# Patient Record
Sex: Female | Born: 1965 | Race: Black or African American | Hispanic: No | Marital: Married | Smoking: Never smoker
Health system: Southern US, Community
[De-identification: ages and names within clinical notes are randomized; demographics above are authoritative.]

## PROBLEM LIST (undated history)

## (undated) DIAGNOSIS — G5600 Carpal tunnel syndrome, unspecified upper limb: Secondary | ICD-10-CM

## (undated) DIAGNOSIS — I639 Cerebral infarction, unspecified: Secondary | ICD-10-CM

## (undated) DIAGNOSIS — F419 Anxiety disorder, unspecified: Secondary | ICD-10-CM

## (undated) DIAGNOSIS — E11319 Type 2 diabetes mellitus with unspecified diabetic retinopathy without macular edema: Secondary | ICD-10-CM

## (undated) DIAGNOSIS — H3320 Serous retinal detachment, unspecified eye: Secondary | ICD-10-CM

## (undated) DIAGNOSIS — Z8673 Personal history of transient ischemic attack (TIA), and cerebral infarction without residual deficits: Secondary | ICD-10-CM

## (undated) DIAGNOSIS — I10 Essential (primary) hypertension: Secondary | ICD-10-CM

## (undated) DIAGNOSIS — I679 Cerebrovascular disease, unspecified: Secondary | ICD-10-CM

## (undated) DIAGNOSIS — M6530 Trigger finger, unspecified finger: Secondary | ICD-10-CM

## (undated) DIAGNOSIS — G5620 Lesion of ulnar nerve, unspecified upper limb: Secondary | ICD-10-CM

## (undated) DIAGNOSIS — E119 Type 2 diabetes mellitus without complications: Secondary | ICD-10-CM

## (undated) DIAGNOSIS — G43909 Migraine, unspecified, not intractable, without status migrainosus: Secondary | ICD-10-CM

## (undated) DIAGNOSIS — K219 Gastro-esophageal reflux disease without esophagitis: Secondary | ICD-10-CM

## (undated) DIAGNOSIS — R269 Unspecified abnormalities of gait and mobility: Secondary | ICD-10-CM

## (undated) DIAGNOSIS — E785 Hyperlipidemia, unspecified: Secondary | ICD-10-CM

## (undated) DIAGNOSIS — D473 Essential (hemorrhagic) thrombocythemia: Secondary | ICD-10-CM

## (undated) DIAGNOSIS — D75839 Thrombocytosis, unspecified: Secondary | ICD-10-CM

## (undated) HISTORY — DX: Personal history of transient ischemic attack (TIA), and cerebral infarction without residual deficits: Z86.73

## (undated) HISTORY — DX: Type 2 diabetes mellitus without complications: E11.9

## (undated) HISTORY — DX: Anxiety disorder, unspecified: F41.9

## (undated) HISTORY — DX: Lesion of ulnar nerve, unspecified upper limb: G56.20

## (undated) HISTORY — DX: Migraine, unspecified, not intractable, without status migrainosus: G43.909

## (undated) HISTORY — DX: Cerebrovascular disease, unspecified: I67.9

## (undated) HISTORY — DX: Essential (primary) hypertension: I10

## (undated) HISTORY — DX: Essential (hemorrhagic) thrombocythemia: D47.3

## (undated) HISTORY — DX: Unspecified abnormalities of gait and mobility: R26.9

## (undated) HISTORY — DX: Serous retinal detachment, unspecified eye: H33.20

## (undated) HISTORY — DX: Carpal tunnel syndrome, unspecified upper limb: G56.00

## (undated) HISTORY — DX: Trigger finger, unspecified finger: M65.30

## (undated) HISTORY — DX: Hyperlipidemia, unspecified: E78.5

## (undated) HISTORY — DX: Thrombocytosis, unspecified: D75.839

## (undated) HISTORY — DX: Gastro-esophageal reflux disease without esophagitis: K21.9

## (undated) HISTORY — DX: Type 2 diabetes mellitus with unspecified diabetic retinopathy without macular edema: E11.319

## (undated) HISTORY — DX: Cerebral infarction, unspecified: I63.9

## (undated) NOTE — *Deleted (*Deleted)
[facility]  [address] [place],  [place]  [address] [phone]  Clinic Day:  06/24/2020  Referring physician: Crist Fat, MD   This document serves as a record of services personally performed by Gery Pray, MD. It was created on their behalf by [facility], a trained medical scribe. The creation of this record is based on the scribe's personal observations and the provider's statements to them.   CHIEF COMPLAINT:  CC: Essential thrombocytopenia  Current Treatment:  Surveillance   HISTORY OF PRESENT ILLNESS:  Allison Stevens is a 86 y.o. 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  This has been treated with hydroxyurea, but the patient has often been non-compliant.  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  She has persistent pain in the left upper quadrant, attributed to splenomegaly, as well as bone pain, for which she remains on OxyContin with oxycodone for breakthrough.  We tapered her opioids down as much as possible over time.  She remains on 40 mg every 12 hours with oxycodone 10 mg every 4 hours as needed for breakthrough pain.  She saw Dr. Adriana Simas, her gastroenterologist at [facility], and underwent an esophageal dilatation in August 2016.  She had already had a colonoscopy due to previous gastrointestinal complaints.  We have been following a lesion in the pancreas seen incidentally on CT done in the emergency room for abdominal pain.  This was felt to be a benign cyst.  Repeat MRI abdomen in November 2016, revealed a slight increase in the benign appearing cyst of the pancreas.  No splenomegaly was seen.  Repeat MRI abdomen in 1 year was recommended.  She was hospitalized in September 2017 and underwent colonoscopy with removal of a small polyp in the sigmoid colon.  Small internal hemorrhoids were also seen.  Dr. Chales Abrahams also obtained a CEA and CA 19-9, and. the CA  19-9 was elevated at 145, the CEA was normal.  Repeat MRI abdomen with contrast in September 2017 after discharge from the hospital revealed a stable 14 mm cystic lesion in the pancreas.  Again, no splenomegaly was seen.  Follow-up MRI in 1 year was recommended.  Repeat MRI abdomen was done in March 2019 and revealed a 17 mm cystic lesion in the pancreatic uncinate process, which was still felt to be stable.  No significant liver lesions were seen.  Repeat MRI abdomen in 1 year was recommended.  Repeat MRI abdomen in September 2019 revealed a stable 19 mm cystic lesion of the pancreas, which appeared to be benign and still felt to be stable.  Repeat MRI abdomen with and without contrast in 1 year was recommended.  MRI abdomen in October 2020 revealed a stable 17 mm cystic lesion of the pancreas.  Continued yearly follow up MRI abdomen was recommended.  Screening mammogram in February 2016 revealed a possible mass in the right breast.  Right diagnostic mammogram and ultrasound was done and revealed 2 well circumscribed hypoechoic lesions in the right breast at 11 o 'clock, felt to represent cysts.  Repeat mammogram and ultrasound in August revealed heterogeneous fibroglandular tissue felt to be benign, with resolution of the previously seen cysts and no masses.  Repeat diagnostic bilateral mammogram and ultrasound in 6 months was once again recommended.  Bilateral diagnostic mammogram in March 2017 did not reveal any evidence of malignancy, so 1 year follow-up screening mammogram was recommended.  Bilateral screening mammogram in March 2018  did not reveal any evidence of malignancy.  Bone density scan in June 2018 revealed osteopenia with a T-score of -1.5 in the femur.  The patient was instructed to take calcium and vitamin-D twice daily.  The patient's family history is significant in that her father had pancreatic cancer at age 41, a paternal uncle had pancreatic cancer and a paternal aunt had pancreatic cancer.   Her brother also had pancreatic cancer at age 52.  She is appropriate for genetic testing, but this has not been done yet due to insurance coverage.  Bilateral screening mammogram in December 2020 did not reveal any evidence of malignancy.  At her visit in June, Dr. Gilman Buttner tried decreasing her OxyContin 30 mg every 12 hours, but then increased back to 40 mg every 12 hours, as her pain was not controlled.  She is here for routine follow up and continues to do fairly well overall, but is not feeling well today.  She states she continues hydroxyurea 500 mg four times daily.  She reports fatigue, abdominal bloating and pain.  She denies nausea, vomiting, diarrhea or constipation.  She states she is moving her bowels regularly.  She continues to have bone pain as well.  She rates her pain 4/10 today.  She continues OxyContin 40 mg every 12 hours with oxycodone 10 mg every 4 hours as needed.  She requests a refill of her medications today.  She denies fevers or chills.  She denies sore throat, cough, dyspnea, or chest pain.  She states her appetite is good, but she is not eating any different than previous and has gained weight.   INTERVAL HISTORY:  Allison Stevens is here for follow up ***.   Her  appetite is good, and she has gained/lost _ pounds since her last visit.  She denies fever, chills or other signs of infection.  She denies nausea, vomiting, bowel issues, or abdominal pain.  She denies sore throat, cough, dyspnea, or chest pain.   REVIEW OF SYSTEMS:  Review of Systems - Oncology   VITALS:  There were no vitals taken for this visit.  Wt Readings from Last 3 Encounters:  05/28/19 131 lb 6.4 oz (59.6 kg)  12/27/16 141 lb 3.2 oz (64 kg)    There is no height or weight on file to calculate BMI.  Performance status (ECOG): {CHL ONC [id number]  PHYSICAL EXAM:  Physical Exam  LABS:  No flowsheet data found. No flowsheet data found.   No results found for: CEA1 / No results found for: CEA1  No results found for: PSA1 No results found for: ZOX096 No results found for: CAN125  No results found for: TOTALPROTELP, ALBUMINELP, A1GS, A2GS, BETS, BETA2SER, GAMS, MSPIKE, SPEI No results found for: TIBC, FERRITIN, IRONPCTSAT No results found for: LDH   STUDIES:  No results found.   Allergies:  Allergies  Allergen Reactions  . Iodinated Diagnostic Agents Other (See Comments) and Shortness Of Breath    Current Medications: Current Outpatient Medications  Medication Sig Dispense Refill  . ALPRAZolam (XANAX) 0.5 MG tablet Take 0.5 mg by mouth at bedtime as needed for anxiety.    Marland Kitchen amLODipine (NORVASC) 10 MG tablet Take 10 mg by mouth daily.    . clopidogrel (PLAVIX) 75 MG tablet Take 75 mg by mouth daily.    . enalapril (VASOTEC) 10 MG tablet Take 10 mg by mouth 2 (two) times daily.    Marland Kitchen escitalopram (LEXAPRO) 10 MG tablet Take 10 mg by mouth daily.    Marland Kitchen  hydroxyurea (HYDREA) 500 MG capsule Take 500 mg by mouth 4 (four) times daily. May take with food to minimize GI side effects.    Marland Kitchen ibuprofen (ADVIL,MOTRIN) 800 MG tablet Take 800 mg by mouth every 8 (eight) hours as needed.    . insulin aspart (NOVOLOG) 100 UNIT/ML injection Inject 34 Units into the skin 3 (three) times daily before meals.    . Insulin Detemir (LEVEMIR) 100 UNIT/ML Pen Inject into the skin.    . Insulin Glargine (TOUJEO MAX SOLOSTAR) 300 UNIT/ML SOPN Inject 72 Units into the skin daily.    . metFORMIN (GLUCOPHAGE) 1000 MG tablet Take 1,000 mg by mouth 2 (two) times daily with a meal.    . montelukast (SINGULAIR) 10 MG tablet Take 10 mg by mouth at bedtime.    . niacin 500 MG tablet Take 500 mg by mouth at bedtime.    Marland Kitchen oxycodone (OXY-IR) 5 MG capsule Take 10 mg by mouth every 4 (four) hours as needed.    Marland Kitchen oxyCODONE (OXYCONTIN) 40 mg 12 hr tablet Take 40 mg by mouth every 12 (twelve) hours.    . pantoprazole (PROTONIX) 40 MG tablet Take 1 tablet (40 mg total) by mouth daily. 30 tablet 6  . pravastatin  (PRAVACHOL) 40 MG tablet Take 40 mg by mouth daily.    . prochlorperazine (COMPAZINE) 10 MG tablet Take 10 mg by mouth every 6 (six) hours as needed for nausea or vomiting.    . TRULICITY 3 MG/0.5ML SOPN      No current facility-administered medications for this visit.     ASSESSMENT & PLAN:   Assessment:   1. Essential thrombocytosis, well controlled with hydroxyurea 500 mg 4 times daily.  She is on chronic opioids due to bone pain.    2. Prior strokes due to thrombocytosis.  3. Cystic lesion of the pancreas, for which she has been undergoing annual MRI abdomen, which is due in October.  4. Diabetes, for which she sees Dr. Leonia Reader.   5. Elevated liver transaminases of uncertain etiology, these have fluctuated up and down, but they are higher than previous.    6. Abdominal bloating and discomfort, with history of pancreatic cyst.  7. Multiple first-degree relatives with pancreatic cancer.  Genetic testing has been recommended, but not done due to lack of insurance coverage.  Plan: Due to the abdominal bloating and discomfort, as well as elevated liver transaminases, I will proceed with an MRI abdomen as soon as possible instead of waiting a full year.  I will plan to contact her with the results.  She knows to continue hydroxyurea 4 times daily.  We will continue OxyContin 40 mg every 12 hours and oxycodone 10 mg every 4 hours as needed for breakthrough pain and refilled those today.  We will see her back in 3 months with CBC and comprehensive metabolic profile for continue follow-up.  The patient understands the plans discussed today and is in agreement with them.  She knows to contact our office if she develops concerns prior to her next appointment.   I provided *** minutes (1:18 PM - 1:18 PM) of face-to-face time during this this encounter and > 50% was spent counseling as documented under my assessment and plan.    Dellia Beckwith, MD [facility] [address] [place] [address] Dept: [phone] Dept Fax: [phone]   I, Foye Deer, am acting as scribe for Dellia Beckwith, MD  I have reviewed this report as typed by the medical scribe, and it is complete and accurate.

---

## 1998-10-18 ENCOUNTER — Observation Stay (HOSPITAL_COMMUNITY): Admission: EM | Admit: 1998-10-18 | Discharge: 1998-10-19 | Payer: Self-pay | Admitting: Emergency Medicine

## 2000-08-01 DIAGNOSIS — E119 Type 2 diabetes mellitus without complications: Secondary | ICD-10-CM

## 2000-08-01 HISTORY — DX: Type 2 diabetes mellitus without complications: E11.9

## 2002-03-28 ENCOUNTER — Encounter: Payer: Self-pay | Admitting: General Surgery

## 2002-03-28 ENCOUNTER — Encounter: Admission: RE | Admit: 2002-03-28 | Discharge: 2002-03-28 | Payer: Self-pay | Admitting: General Surgery

## 2003-10-09 ENCOUNTER — Emergency Department (HOSPITAL_COMMUNITY): Admission: EM | Admit: 2003-10-09 | Discharge: 2003-10-09 | Payer: Self-pay | Admitting: Emergency Medicine

## 2004-06-04 ENCOUNTER — Ambulatory Visit: Payer: Self-pay | Admitting: Oncology

## 2004-07-23 ENCOUNTER — Ambulatory Visit: Payer: Self-pay | Admitting: Oncology

## 2004-09-13 ENCOUNTER — Ambulatory Visit: Payer: Self-pay | Admitting: Oncology

## 2004-11-10 ENCOUNTER — Ambulatory Visit: Payer: Self-pay | Admitting: Oncology

## 2005-01-05 ENCOUNTER — Ambulatory Visit: Payer: Self-pay | Admitting: Oncology

## 2005-03-02 ENCOUNTER — Ambulatory Visit: Payer: Self-pay | Admitting: Oncology

## 2005-04-21 ENCOUNTER — Ambulatory Visit: Payer: Self-pay | Admitting: Oncology

## 2005-06-09 ENCOUNTER — Ambulatory Visit: Payer: Self-pay | Admitting: Oncology

## 2005-07-28 ENCOUNTER — Ambulatory Visit: Payer: Self-pay | Admitting: Oncology

## 2005-09-19 ENCOUNTER — Ambulatory Visit: Payer: Self-pay | Admitting: Oncology

## 2005-11-07 ENCOUNTER — Ambulatory Visit (HOSPITAL_COMMUNITY): Admission: RE | Admit: 2005-11-07 | Discharge: 2005-11-07 | Payer: Self-pay | Admitting: Vascular Surgery

## 2005-11-11 ENCOUNTER — Ambulatory Visit: Payer: Self-pay | Admitting: Oncology

## 2006-01-06 ENCOUNTER — Ambulatory Visit: Payer: Self-pay | Admitting: Oncology

## 2006-03-01 ENCOUNTER — Ambulatory Visit: Payer: Self-pay | Admitting: Oncology

## 2006-05-08 ENCOUNTER — Ambulatory Visit: Payer: Self-pay | Admitting: Oncology

## 2006-07-06 ENCOUNTER — Ambulatory Visit: Payer: Self-pay | Admitting: Oncology

## 2006-08-31 ENCOUNTER — Ambulatory Visit: Payer: Self-pay | Admitting: Oncology

## 2006-10-26 ENCOUNTER — Ambulatory Visit: Payer: Self-pay | Admitting: Oncology

## 2007-04-16 ENCOUNTER — Ambulatory Visit: Payer: Self-pay | Admitting: Oncology

## 2007-11-03 IMAGING — CR DG CHEST 2V
2 series · 2 of 2 positions shown · non-contrast
Comparison: none

CLINICAL DATA: Preop embolization in the right foot.
 CHEST - 2 VIEW:
 The heart size and mediastinal contours are within normal limits.  Both lungs are clear.  The visualized skeletal structures are unremarkable.

[view not recorded (1 of 2)]
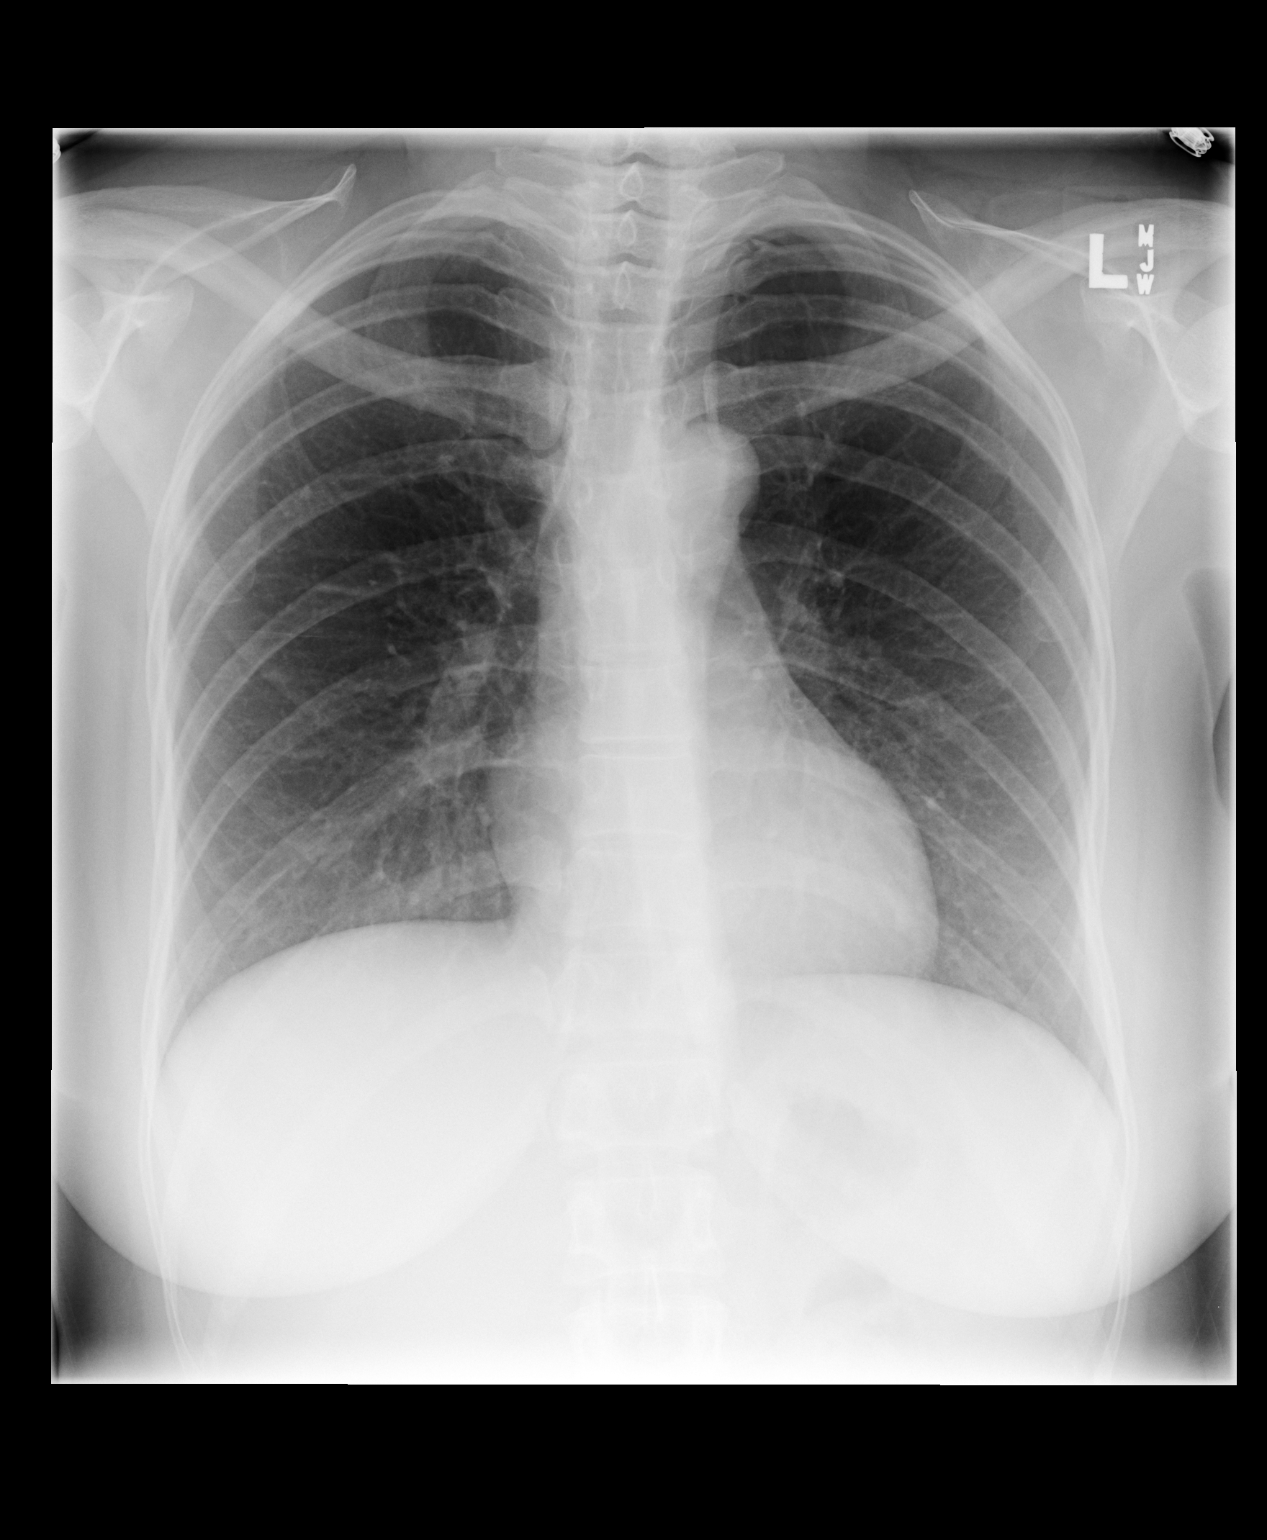

[view not recorded (2 of 2)]
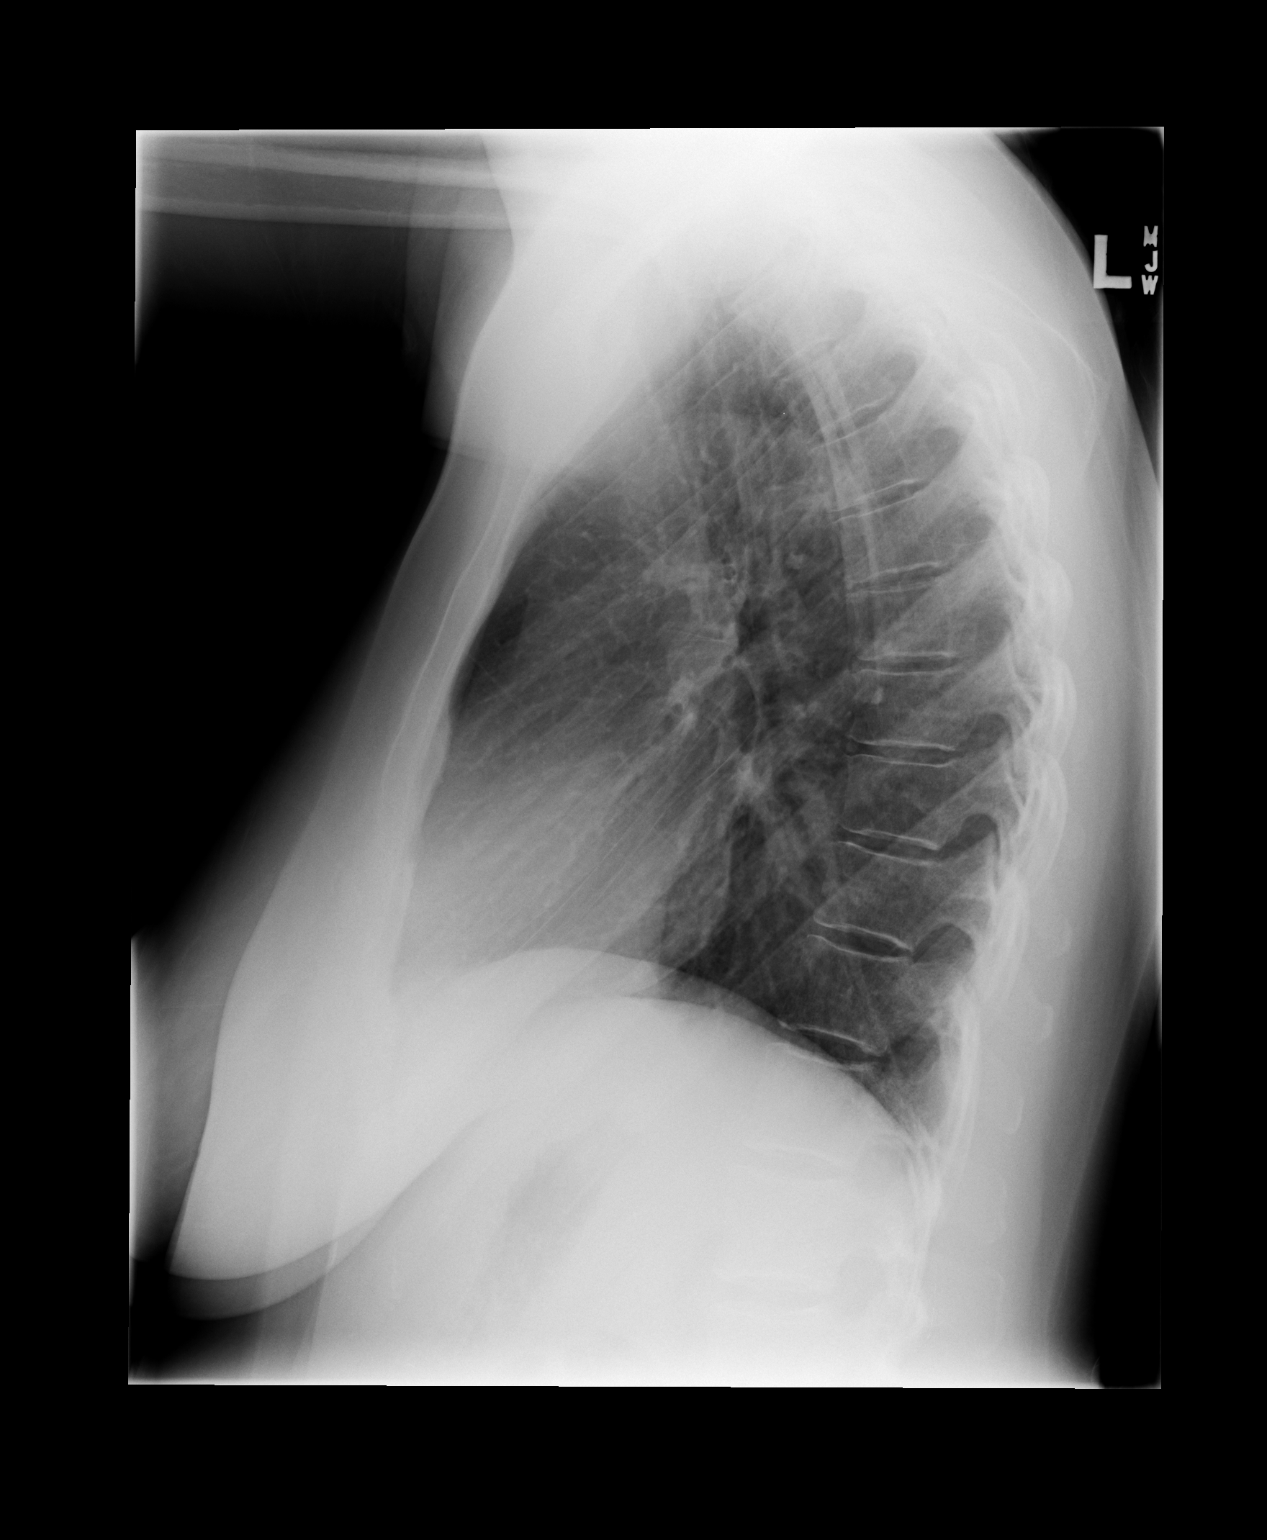

[2 of 2 positions shown; findings below may reference images not displayed]

IMPRESSION: No active cardiopulmonary disease.

## 2009-08-01 HISTORY — PX: GALLBLADDER SURGERY: SHX652

## 2010-12-17 NOTE — Op Note (Signed)
NAMERHANDI, DESPAIN              ACCOUNT NO.:  [id number]   MEDICAL RECORD NO.:  [id number]          PATIENT TYPE:  AMB   LOCATION:  SDS                          FACILITY:  [facility]   PHYSICIAN:  Di Kindle. Edilia Bo, M.D.DATE OF BIRTH:  March 21, 1966   DATE OF PROCEDURE:  11/07/2005  DATE OF DISCHARGE:                                 OPERATIVE REPORT   PREOPERATIVE DIAGNOSIS:  Blue third and fifth toes of the right foot with  possible atheroembolic disease.   POSTOPERATIVE DIAGNOSIS:  Blue third and fifth toes of the right foot with  possible atheroembolic disease.   PROCEDURES:  1.  Thoracic aortogram.  2.  Aortogram.  3.  Bilateral lower extremity runoff.   SURGEON:  Di Kindle. Edilia Bo, M.D.   ANESTHESIA:  Local.   INDICATIONS:  This is a 45 year old woman who developed fairly sudden onset  of discoloration of third and fifth toes in the right foot.  She had  palpable femoral and distal pulses; however, it was felt that she could  potentially have a proximal atherosclerotic lesion explaining an embolic  event.  She was brought in for diagnostic arteriography.   TECHNIQUE:  The patient was taken to the PV lab and sedated with a milligram  of Versed and 50 mcg of fentanyl.  The right groin was prepped and draped in  usual sterile fashion.  After the skin was anesthetized with 1% lidocaine,  the right common femoral artery was cannulated and guidewire introduced into  the infrarenal aorta under fluoroscopic control.  5-French sheath was  introduced over the wire and dilator was then removed.  Pigtail catheter was  positioned at the L1 vertebral body and flush aortogram obtained.  Bilateral  oblique iliac projections were obtained after the catheter was brought down  just above the aortic bifurcation.  Bilateral lower extremity runoff films  were obtained.  Next the catheter was advanced into the proximal descending  thoracic aorta and an image was obtained of the  descending thoracic aorta.   FINDINGS:  The descending thoracic aorta is widely patent without evidence  of any atherosclerotic disease.  The infrarenal aorta is widely patent with  no significant atherosclerotic disease or narrowing.  There are single renal  arteries bilaterally with no significant renal artery stenosis noted.  Both  common iliac, external iliac and hypogastric arteries are patent  bilaterally.  The common femoral, superficial femoral, deep femoral  popliteal arteries are all patent bilaterally.  On three different  projections of the right groin, one projection does show some very slight  irregularity in the posterior wall of the common femoral artery which does  not appear to be significant.  There is three-vessel runoff bilaterally via  the anterior tibial, posterior tibial peroneal arteries.   CONCLUSIONS:  No evidence of significant atherosclerotic disease in the  descending thoracic aorta, infrarenal aorta or infrainguinal arteries.      Di Kindle. Edilia Bo, M.D.  Electronically Signed     CSD/MEDQ  D:  11/07/2005  T:  11/07/2005  Job:  [id number]   cc:   Tinnie Gens A. Petrinitz, D.P.M.  Fax:  [phone] 

## 2011-06-08 DIAGNOSIS — D649 Anemia, unspecified: Secondary | ICD-10-CM | POA: Diagnosis not present

## 2011-06-08 DIAGNOSIS — G35 Multiple sclerosis: Secondary | ICD-10-CM | POA: Diagnosis not present

## 2011-06-08 DIAGNOSIS — I10 Essential (primary) hypertension: Secondary | ICD-10-CM | POA: Diagnosis not present

## 2011-06-08 DIAGNOSIS — I69959 Hemiplegia and hemiparesis following unspecified cerebrovascular disease affecting unspecified side: Secondary | ICD-10-CM | POA: Diagnosis not present

## 2011-06-08 DIAGNOSIS — E119 Type 2 diabetes mellitus without complications: Secondary | ICD-10-CM | POA: Diagnosis not present

## 2011-06-08 DIAGNOSIS — D473 Essential (hemorrhagic) thrombocythemia: Secondary | ICD-10-CM | POA: Diagnosis not present

## 2011-06-09 DIAGNOSIS — E119 Type 2 diabetes mellitus without complications: Secondary | ICD-10-CM | POA: Diagnosis not present

## 2011-06-09 DIAGNOSIS — D649 Anemia, unspecified: Secondary | ICD-10-CM | POA: Diagnosis not present

## 2011-06-09 DIAGNOSIS — I69959 Hemiplegia and hemiparesis following unspecified cerebrovascular disease affecting unspecified side: Secondary | ICD-10-CM | POA: Diagnosis not present

## 2011-06-09 DIAGNOSIS — G35 Multiple sclerosis: Secondary | ICD-10-CM | POA: Diagnosis not present

## 2011-06-09 DIAGNOSIS — I10 Essential (primary) hypertension: Secondary | ICD-10-CM | POA: Diagnosis not present

## 2011-06-09 DIAGNOSIS — D473 Essential (hemorrhagic) thrombocythemia: Secondary | ICD-10-CM | POA: Diagnosis not present

## 2011-06-10 DIAGNOSIS — E119 Type 2 diabetes mellitus without complications: Secondary | ICD-10-CM | POA: Diagnosis not present

## 2011-06-10 DIAGNOSIS — I10 Essential (primary) hypertension: Secondary | ICD-10-CM | POA: Diagnosis not present

## 2011-06-10 DIAGNOSIS — D649 Anemia, unspecified: Secondary | ICD-10-CM | POA: Diagnosis not present

## 2011-06-10 DIAGNOSIS — D473 Essential (hemorrhagic) thrombocythemia: Secondary | ICD-10-CM | POA: Diagnosis not present

## 2011-06-10 DIAGNOSIS — G35 Multiple sclerosis: Secondary | ICD-10-CM | POA: Diagnosis not present

## 2011-06-10 DIAGNOSIS — I69959 Hemiplegia and hemiparesis following unspecified cerebrovascular disease affecting unspecified side: Secondary | ICD-10-CM | POA: Diagnosis not present

## 2011-06-14 DIAGNOSIS — E119 Type 2 diabetes mellitus without complications: Secondary | ICD-10-CM | POA: Diagnosis not present

## 2011-06-14 DIAGNOSIS — I10 Essential (primary) hypertension: Secondary | ICD-10-CM | POA: Diagnosis not present

## 2011-06-14 DIAGNOSIS — D649 Anemia, unspecified: Secondary | ICD-10-CM | POA: Diagnosis not present

## 2011-06-14 DIAGNOSIS — I69959 Hemiplegia and hemiparesis following unspecified cerebrovascular disease affecting unspecified side: Secondary | ICD-10-CM | POA: Diagnosis not present

## 2011-06-14 DIAGNOSIS — G35 Multiple sclerosis: Secondary | ICD-10-CM | POA: Diagnosis not present

## 2011-06-14 DIAGNOSIS — D473 Essential (hemorrhagic) thrombocythemia: Secondary | ICD-10-CM | POA: Diagnosis not present

## 2011-06-15 DIAGNOSIS — D473 Essential (hemorrhagic) thrombocythemia: Secondary | ICD-10-CM | POA: Diagnosis not present

## 2011-06-15 DIAGNOSIS — I69959 Hemiplegia and hemiparesis following unspecified cerebrovascular disease affecting unspecified side: Secondary | ICD-10-CM | POA: Diagnosis not present

## 2011-06-15 DIAGNOSIS — G35 Multiple sclerosis: Secondary | ICD-10-CM | POA: Diagnosis not present

## 2011-06-15 DIAGNOSIS — E119 Type 2 diabetes mellitus without complications: Secondary | ICD-10-CM | POA: Diagnosis not present

## 2011-06-15 DIAGNOSIS — D649 Anemia, unspecified: Secondary | ICD-10-CM | POA: Diagnosis not present

## 2011-06-15 DIAGNOSIS — I10 Essential (primary) hypertension: Secondary | ICD-10-CM | POA: Diagnosis not present

## 2011-06-16 DIAGNOSIS — D649 Anemia, unspecified: Secondary | ICD-10-CM | POA: Diagnosis not present

## 2011-06-16 DIAGNOSIS — I69959 Hemiplegia and hemiparesis following unspecified cerebrovascular disease affecting unspecified side: Secondary | ICD-10-CM | POA: Diagnosis not present

## 2011-06-16 DIAGNOSIS — G35 Multiple sclerosis: Secondary | ICD-10-CM | POA: Diagnosis not present

## 2011-06-16 DIAGNOSIS — I10 Essential (primary) hypertension: Secondary | ICD-10-CM | POA: Diagnosis not present

## 2011-06-16 DIAGNOSIS — E119 Type 2 diabetes mellitus without complications: Secondary | ICD-10-CM | POA: Diagnosis not present

## 2011-06-16 DIAGNOSIS — D473 Essential (hemorrhagic) thrombocythemia: Secondary | ICD-10-CM | POA: Diagnosis not present

## 2011-06-19 DIAGNOSIS — D473 Essential (hemorrhagic) thrombocythemia: Secondary | ICD-10-CM | POA: Diagnosis not present

## 2011-06-19 DIAGNOSIS — I69959 Hemiplegia and hemiparesis following unspecified cerebrovascular disease affecting unspecified side: Secondary | ICD-10-CM | POA: Diagnosis not present

## 2011-06-19 DIAGNOSIS — E119 Type 2 diabetes mellitus without complications: Secondary | ICD-10-CM | POA: Diagnosis not present

## 2011-06-19 DIAGNOSIS — D649 Anemia, unspecified: Secondary | ICD-10-CM | POA: Diagnosis not present

## 2011-06-19 DIAGNOSIS — I10 Essential (primary) hypertension: Secondary | ICD-10-CM | POA: Diagnosis not present

## 2011-06-19 DIAGNOSIS — G35 Multiple sclerosis: Secondary | ICD-10-CM | POA: Diagnosis not present

## 2011-06-20 DIAGNOSIS — G35 Multiple sclerosis: Secondary | ICD-10-CM | POA: Diagnosis not present

## 2011-06-20 DIAGNOSIS — I10 Essential (primary) hypertension: Secondary | ICD-10-CM | POA: Diagnosis not present

## 2011-06-20 DIAGNOSIS — E119 Type 2 diabetes mellitus without complications: Secondary | ICD-10-CM | POA: Diagnosis not present

## 2011-06-20 DIAGNOSIS — D473 Essential (hemorrhagic) thrombocythemia: Secondary | ICD-10-CM | POA: Diagnosis not present

## 2011-06-20 DIAGNOSIS — D649 Anemia, unspecified: Secondary | ICD-10-CM | POA: Diagnosis not present

## 2011-06-20 DIAGNOSIS — I69959 Hemiplegia and hemiparesis following unspecified cerebrovascular disease affecting unspecified side: Secondary | ICD-10-CM | POA: Diagnosis not present

## 2011-06-21 DIAGNOSIS — D649 Anemia, unspecified: Secondary | ICD-10-CM | POA: Diagnosis not present

## 2011-06-21 DIAGNOSIS — I69959 Hemiplegia and hemiparesis following unspecified cerebrovascular disease affecting unspecified side: Secondary | ICD-10-CM | POA: Diagnosis not present

## 2011-06-21 DIAGNOSIS — E119 Type 2 diabetes mellitus without complications: Secondary | ICD-10-CM | POA: Diagnosis not present

## 2011-06-21 DIAGNOSIS — G35 Multiple sclerosis: Secondary | ICD-10-CM | POA: Diagnosis not present

## 2011-06-21 DIAGNOSIS — D473 Essential (hemorrhagic) thrombocythemia: Secondary | ICD-10-CM | POA: Diagnosis not present

## 2011-06-21 DIAGNOSIS — I10 Essential (primary) hypertension: Secondary | ICD-10-CM | POA: Diagnosis not present

## 2011-06-22 DIAGNOSIS — I10 Essential (primary) hypertension: Secondary | ICD-10-CM | POA: Diagnosis not present

## 2011-06-22 DIAGNOSIS — D473 Essential (hemorrhagic) thrombocythemia: Secondary | ICD-10-CM | POA: Diagnosis not present

## 2011-06-22 DIAGNOSIS — G35 Multiple sclerosis: Secondary | ICD-10-CM | POA: Diagnosis not present

## 2011-06-22 DIAGNOSIS — D649 Anemia, unspecified: Secondary | ICD-10-CM | POA: Diagnosis not present

## 2011-06-22 DIAGNOSIS — I69959 Hemiplegia and hemiparesis following unspecified cerebrovascular disease affecting unspecified side: Secondary | ICD-10-CM | POA: Diagnosis not present

## 2011-06-22 DIAGNOSIS — E119 Type 2 diabetes mellitus without complications: Secondary | ICD-10-CM | POA: Diagnosis not present

## 2011-06-28 DIAGNOSIS — E119 Type 2 diabetes mellitus without complications: Secondary | ICD-10-CM | POA: Diagnosis not present

## 2011-06-28 DIAGNOSIS — I10 Essential (primary) hypertension: Secondary | ICD-10-CM | POA: Diagnosis not present

## 2011-06-28 DIAGNOSIS — D473 Essential (hemorrhagic) thrombocythemia: Secondary | ICD-10-CM | POA: Diagnosis not present

## 2011-06-28 DIAGNOSIS — G35 Multiple sclerosis: Secondary | ICD-10-CM | POA: Diagnosis not present

## 2011-06-28 DIAGNOSIS — I69959 Hemiplegia and hemiparesis following unspecified cerebrovascular disease affecting unspecified side: Secondary | ICD-10-CM | POA: Diagnosis not present

## 2011-06-28 DIAGNOSIS — D649 Anemia, unspecified: Secondary | ICD-10-CM | POA: Diagnosis not present

## 2011-06-29 DIAGNOSIS — D649 Anemia, unspecified: Secondary | ICD-10-CM | POA: Diagnosis not present

## 2011-06-29 DIAGNOSIS — E119 Type 2 diabetes mellitus without complications: Secondary | ICD-10-CM | POA: Diagnosis not present

## 2011-06-29 DIAGNOSIS — I10 Essential (primary) hypertension: Secondary | ICD-10-CM | POA: Diagnosis not present

## 2011-06-29 DIAGNOSIS — G35 Multiple sclerosis: Secondary | ICD-10-CM | POA: Diagnosis not present

## 2011-06-29 DIAGNOSIS — D473 Essential (hemorrhagic) thrombocythemia: Secondary | ICD-10-CM | POA: Diagnosis not present

## 2011-06-29 DIAGNOSIS — I69959 Hemiplegia and hemiparesis following unspecified cerebrovascular disease affecting unspecified side: Secondary | ICD-10-CM | POA: Diagnosis not present

## 2011-06-30 DIAGNOSIS — E119 Type 2 diabetes mellitus without complications: Secondary | ICD-10-CM | POA: Diagnosis not present

## 2011-06-30 DIAGNOSIS — I69959 Hemiplegia and hemiparesis following unspecified cerebrovascular disease affecting unspecified side: Secondary | ICD-10-CM | POA: Diagnosis not present

## 2011-06-30 DIAGNOSIS — G35 Multiple sclerosis: Secondary | ICD-10-CM | POA: Diagnosis not present

## 2011-06-30 DIAGNOSIS — D473 Essential (hemorrhagic) thrombocythemia: Secondary | ICD-10-CM | POA: Diagnosis not present

## 2011-06-30 DIAGNOSIS — I10 Essential (primary) hypertension: Secondary | ICD-10-CM | POA: Diagnosis not present

## 2011-06-30 DIAGNOSIS — D649 Anemia, unspecified: Secondary | ICD-10-CM | POA: Diagnosis not present

## 2011-07-01 DIAGNOSIS — D473 Essential (hemorrhagic) thrombocythemia: Secondary | ICD-10-CM | POA: Diagnosis not present

## 2011-07-01 DIAGNOSIS — D649 Anemia, unspecified: Secondary | ICD-10-CM | POA: Diagnosis not present

## 2011-07-01 DIAGNOSIS — I69959 Hemiplegia and hemiparesis following unspecified cerebrovascular disease affecting unspecified side: Secondary | ICD-10-CM | POA: Diagnosis not present

## 2011-07-01 DIAGNOSIS — G35 Multiple sclerosis: Secondary | ICD-10-CM | POA: Diagnosis not present

## 2011-07-01 DIAGNOSIS — I10 Essential (primary) hypertension: Secondary | ICD-10-CM | POA: Diagnosis not present

## 2011-07-01 DIAGNOSIS — E119 Type 2 diabetes mellitus without complications: Secondary | ICD-10-CM | POA: Diagnosis not present

## 2011-07-05 DIAGNOSIS — D473 Essential (hemorrhagic) thrombocythemia: Secondary | ICD-10-CM | POA: Diagnosis not present

## 2011-07-05 DIAGNOSIS — I10 Essential (primary) hypertension: Secondary | ICD-10-CM | POA: Diagnosis not present

## 2011-07-05 DIAGNOSIS — E119 Type 2 diabetes mellitus without complications: Secondary | ICD-10-CM | POA: Diagnosis not present

## 2011-07-05 DIAGNOSIS — D649 Anemia, unspecified: Secondary | ICD-10-CM | POA: Diagnosis not present

## 2011-07-05 DIAGNOSIS — I69959 Hemiplegia and hemiparesis following unspecified cerebrovascular disease affecting unspecified side: Secondary | ICD-10-CM | POA: Diagnosis not present

## 2011-07-05 DIAGNOSIS — G35 Multiple sclerosis: Secondary | ICD-10-CM | POA: Diagnosis not present

## 2011-07-07 DIAGNOSIS — G35 Multiple sclerosis: Secondary | ICD-10-CM | POA: Diagnosis not present

## 2011-07-07 DIAGNOSIS — D649 Anemia, unspecified: Secondary | ICD-10-CM | POA: Diagnosis not present

## 2011-07-07 DIAGNOSIS — I10 Essential (primary) hypertension: Secondary | ICD-10-CM | POA: Diagnosis not present

## 2011-07-07 DIAGNOSIS — I69959 Hemiplegia and hemiparesis following unspecified cerebrovascular disease affecting unspecified side: Secondary | ICD-10-CM | POA: Diagnosis not present

## 2011-07-07 DIAGNOSIS — D473 Essential (hemorrhagic) thrombocythemia: Secondary | ICD-10-CM | POA: Diagnosis not present

## 2011-07-07 DIAGNOSIS — E119 Type 2 diabetes mellitus without complications: Secondary | ICD-10-CM | POA: Diagnosis not present

## 2011-07-08 DIAGNOSIS — I69959 Hemiplegia and hemiparesis following unspecified cerebrovascular disease affecting unspecified side: Secondary | ICD-10-CM | POA: Diagnosis not present

## 2011-07-08 DIAGNOSIS — D649 Anemia, unspecified: Secondary | ICD-10-CM | POA: Diagnosis not present

## 2011-07-08 DIAGNOSIS — G35 Multiple sclerosis: Secondary | ICD-10-CM | POA: Diagnosis not present

## 2011-07-08 DIAGNOSIS — E119 Type 2 diabetes mellitus without complications: Secondary | ICD-10-CM | POA: Diagnosis not present

## 2011-07-08 DIAGNOSIS — D473 Essential (hemorrhagic) thrombocythemia: Secondary | ICD-10-CM | POA: Diagnosis not present

## 2011-07-08 DIAGNOSIS — I10 Essential (primary) hypertension: Secondary | ICD-10-CM | POA: Diagnosis not present

## 2011-07-09 DIAGNOSIS — D649 Anemia, unspecified: Secondary | ICD-10-CM | POA: Diagnosis not present

## 2011-07-09 DIAGNOSIS — D473 Essential (hemorrhagic) thrombocythemia: Secondary | ICD-10-CM | POA: Diagnosis not present

## 2011-07-09 DIAGNOSIS — I69959 Hemiplegia and hemiparesis following unspecified cerebrovascular disease affecting unspecified side: Secondary | ICD-10-CM | POA: Diagnosis not present

## 2011-07-09 DIAGNOSIS — G35 Multiple sclerosis: Secondary | ICD-10-CM | POA: Diagnosis not present

## 2011-07-09 DIAGNOSIS — E119 Type 2 diabetes mellitus without complications: Secondary | ICD-10-CM | POA: Diagnosis not present

## 2011-07-09 DIAGNOSIS — I10 Essential (primary) hypertension: Secondary | ICD-10-CM | POA: Diagnosis not present

## 2011-07-10 DIAGNOSIS — D649 Anemia, unspecified: Secondary | ICD-10-CM | POA: Diagnosis not present

## 2011-07-10 DIAGNOSIS — I10 Essential (primary) hypertension: Secondary | ICD-10-CM | POA: Diagnosis not present

## 2011-07-10 DIAGNOSIS — I69959 Hemiplegia and hemiparesis following unspecified cerebrovascular disease affecting unspecified side: Secondary | ICD-10-CM | POA: Diagnosis not present

## 2011-07-10 DIAGNOSIS — E119 Type 2 diabetes mellitus without complications: Secondary | ICD-10-CM | POA: Diagnosis not present

## 2011-07-10 DIAGNOSIS — G35 Multiple sclerosis: Secondary | ICD-10-CM | POA: Diagnosis not present

## 2011-07-10 DIAGNOSIS — D473 Essential (hemorrhagic) thrombocythemia: Secondary | ICD-10-CM | POA: Diagnosis not present

## 2011-07-11 DIAGNOSIS — G35 Multiple sclerosis: Secondary | ICD-10-CM | POA: Diagnosis not present

## 2011-07-11 DIAGNOSIS — I69959 Hemiplegia and hemiparesis following unspecified cerebrovascular disease affecting unspecified side: Secondary | ICD-10-CM | POA: Diagnosis not present

## 2011-07-11 DIAGNOSIS — E119 Type 2 diabetes mellitus without complications: Secondary | ICD-10-CM | POA: Diagnosis not present

## 2011-07-11 DIAGNOSIS — D649 Anemia, unspecified: Secondary | ICD-10-CM | POA: Diagnosis not present

## 2011-07-11 DIAGNOSIS — I10 Essential (primary) hypertension: Secondary | ICD-10-CM | POA: Diagnosis not present

## 2011-07-11 DIAGNOSIS — D473 Essential (hemorrhagic) thrombocythemia: Secondary | ICD-10-CM | POA: Diagnosis not present

## 2011-07-14 DIAGNOSIS — E119 Type 2 diabetes mellitus without complications: Secondary | ICD-10-CM | POA: Diagnosis not present

## 2011-07-14 DIAGNOSIS — I69959 Hemiplegia and hemiparesis following unspecified cerebrovascular disease affecting unspecified side: Secondary | ICD-10-CM | POA: Diagnosis not present

## 2011-07-14 DIAGNOSIS — D473 Essential (hemorrhagic) thrombocythemia: Secondary | ICD-10-CM | POA: Diagnosis not present

## 2011-07-14 DIAGNOSIS — I10 Essential (primary) hypertension: Secondary | ICD-10-CM | POA: Diagnosis not present

## 2011-07-14 DIAGNOSIS — D649 Anemia, unspecified: Secondary | ICD-10-CM | POA: Diagnosis not present

## 2011-07-14 DIAGNOSIS — G35 Multiple sclerosis: Secondary | ICD-10-CM | POA: Diagnosis not present

## 2011-07-20 DIAGNOSIS — D473 Essential (hemorrhagic) thrombocythemia: Secondary | ICD-10-CM | POA: Diagnosis not present

## 2011-07-20 DIAGNOSIS — G35 Multiple sclerosis: Secondary | ICD-10-CM | POA: Diagnosis not present

## 2011-07-20 DIAGNOSIS — D649 Anemia, unspecified: Secondary | ICD-10-CM | POA: Diagnosis not present

## 2011-07-20 DIAGNOSIS — I69959 Hemiplegia and hemiparesis following unspecified cerebrovascular disease affecting unspecified side: Secondary | ICD-10-CM | POA: Diagnosis not present

## 2011-07-20 DIAGNOSIS — E119 Type 2 diabetes mellitus without complications: Secondary | ICD-10-CM | POA: Diagnosis not present

## 2011-07-20 DIAGNOSIS — I10 Essential (primary) hypertension: Secondary | ICD-10-CM | POA: Diagnosis not present

## 2011-07-21 DIAGNOSIS — I69959 Hemiplegia and hemiparesis following unspecified cerebrovascular disease affecting unspecified side: Secondary | ICD-10-CM | POA: Diagnosis not present

## 2011-07-21 DIAGNOSIS — I10 Essential (primary) hypertension: Secondary | ICD-10-CM | POA: Diagnosis not present

## 2011-07-21 DIAGNOSIS — E119 Type 2 diabetes mellitus without complications: Secondary | ICD-10-CM | POA: Diagnosis not present

## 2011-07-21 DIAGNOSIS — D473 Essential (hemorrhagic) thrombocythemia: Secondary | ICD-10-CM | POA: Diagnosis not present

## 2011-07-21 DIAGNOSIS — G35 Multiple sclerosis: Secondary | ICD-10-CM | POA: Diagnosis not present

## 2011-07-21 DIAGNOSIS — D649 Anemia, unspecified: Secondary | ICD-10-CM | POA: Diagnosis not present

## 2011-07-22 DIAGNOSIS — I10 Essential (primary) hypertension: Secondary | ICD-10-CM | POA: Diagnosis not present

## 2011-07-22 DIAGNOSIS — D473 Essential (hemorrhagic) thrombocythemia: Secondary | ICD-10-CM | POA: Diagnosis not present

## 2011-07-22 DIAGNOSIS — D649 Anemia, unspecified: Secondary | ICD-10-CM | POA: Diagnosis not present

## 2011-07-22 DIAGNOSIS — E119 Type 2 diabetes mellitus without complications: Secondary | ICD-10-CM | POA: Diagnosis not present

## 2011-07-22 DIAGNOSIS — G35 Multiple sclerosis: Secondary | ICD-10-CM | POA: Diagnosis not present

## 2011-07-22 DIAGNOSIS — I69959 Hemiplegia and hemiparesis following unspecified cerebrovascular disease affecting unspecified side: Secondary | ICD-10-CM | POA: Diagnosis not present

## 2011-07-24 DIAGNOSIS — D649 Anemia, unspecified: Secondary | ICD-10-CM | POA: Diagnosis not present

## 2011-07-24 DIAGNOSIS — G35 Multiple sclerosis: Secondary | ICD-10-CM | POA: Diagnosis not present

## 2011-07-24 DIAGNOSIS — I10 Essential (primary) hypertension: Secondary | ICD-10-CM | POA: Diagnosis not present

## 2011-07-24 DIAGNOSIS — I69959 Hemiplegia and hemiparesis following unspecified cerebrovascular disease affecting unspecified side: Secondary | ICD-10-CM | POA: Diagnosis not present

## 2011-07-24 DIAGNOSIS — D473 Essential (hemorrhagic) thrombocythemia: Secondary | ICD-10-CM | POA: Diagnosis not present

## 2011-07-24 DIAGNOSIS — E119 Type 2 diabetes mellitus without complications: Secondary | ICD-10-CM | POA: Diagnosis not present

## 2011-07-28 DIAGNOSIS — D649 Anemia, unspecified: Secondary | ICD-10-CM | POA: Diagnosis not present

## 2011-07-28 DIAGNOSIS — D473 Essential (hemorrhagic) thrombocythemia: Secondary | ICD-10-CM | POA: Diagnosis not present

## 2011-07-28 DIAGNOSIS — G35 Multiple sclerosis: Secondary | ICD-10-CM | POA: Diagnosis not present

## 2011-07-28 DIAGNOSIS — I69959 Hemiplegia and hemiparesis following unspecified cerebrovascular disease affecting unspecified side: Secondary | ICD-10-CM | POA: Diagnosis not present

## 2011-07-28 DIAGNOSIS — I10 Essential (primary) hypertension: Secondary | ICD-10-CM | POA: Diagnosis not present

## 2011-07-28 DIAGNOSIS — E119 Type 2 diabetes mellitus without complications: Secondary | ICD-10-CM | POA: Diagnosis not present

## 2011-08-01 DIAGNOSIS — G35 Multiple sclerosis: Secondary | ICD-10-CM | POA: Diagnosis not present

## 2011-08-01 DIAGNOSIS — I10 Essential (primary) hypertension: Secondary | ICD-10-CM | POA: Diagnosis not present

## 2011-08-01 DIAGNOSIS — D649 Anemia, unspecified: Secondary | ICD-10-CM | POA: Diagnosis not present

## 2011-08-01 DIAGNOSIS — I69959 Hemiplegia and hemiparesis following unspecified cerebrovascular disease affecting unspecified side: Secondary | ICD-10-CM | POA: Diagnosis not present

## 2011-08-01 DIAGNOSIS — D473 Essential (hemorrhagic) thrombocythemia: Secondary | ICD-10-CM | POA: Diagnosis not present

## 2011-08-01 DIAGNOSIS — E119 Type 2 diabetes mellitus without complications: Secondary | ICD-10-CM | POA: Diagnosis not present

## 2011-08-05 DIAGNOSIS — D649 Anemia, unspecified: Secondary | ICD-10-CM | POA: Diagnosis not present

## 2011-08-05 DIAGNOSIS — I10 Essential (primary) hypertension: Secondary | ICD-10-CM | POA: Diagnosis not present

## 2011-08-05 DIAGNOSIS — E119 Type 2 diabetes mellitus without complications: Secondary | ICD-10-CM | POA: Diagnosis not present

## 2011-08-05 DIAGNOSIS — I69959 Hemiplegia and hemiparesis following unspecified cerebrovascular disease affecting unspecified side: Secondary | ICD-10-CM | POA: Diagnosis not present

## 2011-08-05 DIAGNOSIS — D473 Essential (hemorrhagic) thrombocythemia: Secondary | ICD-10-CM | POA: Diagnosis not present

## 2011-08-05 DIAGNOSIS — G35 Multiple sclerosis: Secondary | ICD-10-CM | POA: Diagnosis not present

## 2011-08-07 DIAGNOSIS — G35 Multiple sclerosis: Secondary | ICD-10-CM | POA: Diagnosis not present

## 2011-08-07 DIAGNOSIS — I69959 Hemiplegia and hemiparesis following unspecified cerebrovascular disease affecting unspecified side: Secondary | ICD-10-CM | POA: Diagnosis not present

## 2011-08-07 DIAGNOSIS — D473 Essential (hemorrhagic) thrombocythemia: Secondary | ICD-10-CM | POA: Diagnosis not present

## 2011-08-07 DIAGNOSIS — E119 Type 2 diabetes mellitus without complications: Secondary | ICD-10-CM | POA: Diagnosis not present

## 2011-08-07 DIAGNOSIS — I10 Essential (primary) hypertension: Secondary | ICD-10-CM | POA: Diagnosis not present

## 2011-08-07 DIAGNOSIS — D649 Anemia, unspecified: Secondary | ICD-10-CM | POA: Diagnosis not present

## 2011-08-16 DIAGNOSIS — G35 Multiple sclerosis: Secondary | ICD-10-CM | POA: Diagnosis not present

## 2011-08-16 DIAGNOSIS — I10 Essential (primary) hypertension: Secondary | ICD-10-CM | POA: Diagnosis not present

## 2011-08-16 DIAGNOSIS — D473 Essential (hemorrhagic) thrombocythemia: Secondary | ICD-10-CM | POA: Diagnosis not present

## 2011-08-16 DIAGNOSIS — D649 Anemia, unspecified: Secondary | ICD-10-CM | POA: Diagnosis not present

## 2011-08-16 DIAGNOSIS — E119 Type 2 diabetes mellitus without complications: Secondary | ICD-10-CM | POA: Diagnosis not present

## 2011-08-16 DIAGNOSIS — I69959 Hemiplegia and hemiparesis following unspecified cerebrovascular disease affecting unspecified side: Secondary | ICD-10-CM | POA: Diagnosis not present

## 2011-08-23 DIAGNOSIS — D649 Anemia, unspecified: Secondary | ICD-10-CM | POA: Diagnosis not present

## 2011-08-23 DIAGNOSIS — G35 Multiple sclerosis: Secondary | ICD-10-CM | POA: Diagnosis not present

## 2011-08-23 DIAGNOSIS — R110 Nausea: Secondary | ICD-10-CM | POA: Diagnosis not present

## 2011-08-23 DIAGNOSIS — E119 Type 2 diabetes mellitus without complications: Secondary | ICD-10-CM | POA: Diagnosis not present

## 2011-08-23 DIAGNOSIS — I69959 Hemiplegia and hemiparesis following unspecified cerebrovascular disease affecting unspecified side: Secondary | ICD-10-CM | POA: Diagnosis not present

## 2011-08-23 DIAGNOSIS — D473 Essential (hemorrhagic) thrombocythemia: Secondary | ICD-10-CM | POA: Diagnosis not present

## 2011-08-23 DIAGNOSIS — M79609 Pain in unspecified limb: Secondary | ICD-10-CM | POA: Diagnosis not present

## 2011-08-23 DIAGNOSIS — I10 Essential (primary) hypertension: Secondary | ICD-10-CM | POA: Diagnosis not present

## 2011-08-30 DIAGNOSIS — Z7901 Long term (current) use of anticoagulants: Secondary | ICD-10-CM | POA: Diagnosis not present

## 2011-08-30 DIAGNOSIS — I10 Essential (primary) hypertension: Secondary | ICD-10-CM | POA: Diagnosis not present

## 2011-08-30 DIAGNOSIS — Z5181 Encounter for therapeutic drug level monitoring: Secondary | ICD-10-CM | POA: Diagnosis not present

## 2011-08-30 DIAGNOSIS — I69959 Hemiplegia and hemiparesis following unspecified cerebrovascular disease affecting unspecified side: Secondary | ICD-10-CM | POA: Diagnosis not present

## 2011-08-30 DIAGNOSIS — E119 Type 2 diabetes mellitus without complications: Secondary | ICD-10-CM | POA: Diagnosis not present

## 2011-08-30 DIAGNOSIS — G35 Multiple sclerosis: Secondary | ICD-10-CM | POA: Diagnosis not present

## 2011-08-30 DIAGNOSIS — D649 Anemia, unspecified: Secondary | ICD-10-CM | POA: Diagnosis not present

## 2011-08-30 DIAGNOSIS — D473 Essential (hemorrhagic) thrombocythemia: Secondary | ICD-10-CM | POA: Diagnosis not present

## 2011-09-01 DIAGNOSIS — G35 Multiple sclerosis: Secondary | ICD-10-CM | POA: Diagnosis not present

## 2011-09-01 DIAGNOSIS — E119 Type 2 diabetes mellitus without complications: Secondary | ICD-10-CM | POA: Diagnosis not present

## 2011-09-01 DIAGNOSIS — D649 Anemia, unspecified: Secondary | ICD-10-CM | POA: Diagnosis not present

## 2011-09-01 DIAGNOSIS — I10 Essential (primary) hypertension: Secondary | ICD-10-CM | POA: Diagnosis not present

## 2011-09-01 DIAGNOSIS — Z5181 Encounter for therapeutic drug level monitoring: Secondary | ICD-10-CM | POA: Diagnosis not present

## 2011-09-01 DIAGNOSIS — D473 Essential (hemorrhagic) thrombocythemia: Secondary | ICD-10-CM | POA: Diagnosis not present

## 2011-09-01 DIAGNOSIS — Z7901 Long term (current) use of anticoagulants: Secondary | ICD-10-CM | POA: Diagnosis not present

## 2011-09-01 DIAGNOSIS — I69959 Hemiplegia and hemiparesis following unspecified cerebrovascular disease affecting unspecified side: Secondary | ICD-10-CM | POA: Diagnosis not present

## 2011-09-07 DIAGNOSIS — D649 Anemia, unspecified: Secondary | ICD-10-CM | POA: Diagnosis not present

## 2011-09-07 DIAGNOSIS — I69959 Hemiplegia and hemiparesis following unspecified cerebrovascular disease affecting unspecified side: Secondary | ICD-10-CM | POA: Diagnosis not present

## 2011-09-07 DIAGNOSIS — I10 Essential (primary) hypertension: Secondary | ICD-10-CM | POA: Diagnosis not present

## 2011-09-07 DIAGNOSIS — E119 Type 2 diabetes mellitus without complications: Secondary | ICD-10-CM | POA: Diagnosis not present

## 2011-09-07 DIAGNOSIS — D473 Essential (hemorrhagic) thrombocythemia: Secondary | ICD-10-CM | POA: Diagnosis not present

## 2011-09-07 DIAGNOSIS — G35 Multiple sclerosis: Secondary | ICD-10-CM | POA: Diagnosis not present

## 2011-09-13 DIAGNOSIS — I69959 Hemiplegia and hemiparesis following unspecified cerebrovascular disease affecting unspecified side: Secondary | ICD-10-CM | POA: Diagnosis not present

## 2011-09-13 DIAGNOSIS — D649 Anemia, unspecified: Secondary | ICD-10-CM | POA: Diagnosis not present

## 2011-09-13 DIAGNOSIS — I10 Essential (primary) hypertension: Secondary | ICD-10-CM | POA: Diagnosis not present

## 2011-09-13 DIAGNOSIS — G35 Multiple sclerosis: Secondary | ICD-10-CM | POA: Diagnosis not present

## 2011-09-13 DIAGNOSIS — D473 Essential (hemorrhagic) thrombocythemia: Secondary | ICD-10-CM | POA: Diagnosis not present

## 2011-09-13 DIAGNOSIS — E119 Type 2 diabetes mellitus without complications: Secondary | ICD-10-CM | POA: Diagnosis not present

## 2011-09-14 DIAGNOSIS — I6789 Other cerebrovascular disease: Secondary | ICD-10-CM | POA: Diagnosis not present

## 2011-09-16 DIAGNOSIS — Z5181 Encounter for therapeutic drug level monitoring: Secondary | ICD-10-CM | POA: Diagnosis not present

## 2011-09-16 DIAGNOSIS — D473 Essential (hemorrhagic) thrombocythemia: Secondary | ICD-10-CM | POA: Diagnosis not present

## 2011-09-16 DIAGNOSIS — Z794 Long term (current) use of insulin: Secondary | ICD-10-CM | POA: Diagnosis not present

## 2011-09-16 DIAGNOSIS — R1031 Right lower quadrant pain: Secondary | ICD-10-CM | POA: Diagnosis not present

## 2011-09-16 DIAGNOSIS — Z7901 Long term (current) use of anticoagulants: Secondary | ICD-10-CM | POA: Diagnosis not present

## 2011-09-16 DIAGNOSIS — F329 Major depressive disorder, single episode, unspecified: Secondary | ICD-10-CM | POA: Diagnosis not present

## 2011-09-16 DIAGNOSIS — Z8673 Personal history of transient ischemic attack (TIA), and cerebral infarction without residual deficits: Secondary | ICD-10-CM | POA: Diagnosis not present

## 2011-09-20 DIAGNOSIS — R1031 Right lower quadrant pain: Secondary | ICD-10-CM | POA: Diagnosis not present

## 2011-09-20 DIAGNOSIS — Z794 Long term (current) use of insulin: Secondary | ICD-10-CM | POA: Diagnosis not present

## 2011-09-20 DIAGNOSIS — D473 Essential (hemorrhagic) thrombocythemia: Secondary | ICD-10-CM | POA: Diagnosis not present

## 2011-09-20 DIAGNOSIS — F329 Major depressive disorder, single episode, unspecified: Secondary | ICD-10-CM | POA: Diagnosis not present

## 2011-09-20 DIAGNOSIS — Z7901 Long term (current) use of anticoagulants: Secondary | ICD-10-CM | POA: Diagnosis not present

## 2011-09-22 DIAGNOSIS — D473 Essential (hemorrhagic) thrombocythemia: Secondary | ICD-10-CM | POA: Diagnosis not present

## 2011-09-22 DIAGNOSIS — M79609 Pain in unspecified limb: Secondary | ICD-10-CM | POA: Diagnosis not present

## 2011-09-23 DIAGNOSIS — F329 Major depressive disorder, single episode, unspecified: Secondary | ICD-10-CM | POA: Diagnosis not present

## 2011-09-23 DIAGNOSIS — Z794 Long term (current) use of insulin: Secondary | ICD-10-CM | POA: Diagnosis not present

## 2011-09-23 DIAGNOSIS — R1031 Right lower quadrant pain: Secondary | ICD-10-CM | POA: Diagnosis not present

## 2011-09-23 DIAGNOSIS — Z7901 Long term (current) use of anticoagulants: Secondary | ICD-10-CM | POA: Diagnosis not present

## 2011-09-23 DIAGNOSIS — D473 Essential (hemorrhagic) thrombocythemia: Secondary | ICD-10-CM | POA: Diagnosis not present

## 2011-09-30 DIAGNOSIS — R1031 Right lower quadrant pain: Secondary | ICD-10-CM | POA: Diagnosis not present

## 2011-09-30 DIAGNOSIS — F329 Major depressive disorder, single episode, unspecified: Secondary | ICD-10-CM | POA: Diagnosis not present

## 2011-09-30 DIAGNOSIS — Z794 Long term (current) use of insulin: Secondary | ICD-10-CM | POA: Diagnosis not present

## 2011-09-30 DIAGNOSIS — D473 Essential (hemorrhagic) thrombocythemia: Secondary | ICD-10-CM | POA: Diagnosis not present

## 2011-09-30 DIAGNOSIS — Z7901 Long term (current) use of anticoagulants: Secondary | ICD-10-CM | POA: Diagnosis not present

## 2011-10-05 DIAGNOSIS — Z7901 Long term (current) use of anticoagulants: Secondary | ICD-10-CM | POA: Diagnosis not present

## 2011-10-05 DIAGNOSIS — R1031 Right lower quadrant pain: Secondary | ICD-10-CM | POA: Diagnosis not present

## 2011-10-05 DIAGNOSIS — Z794 Long term (current) use of insulin: Secondary | ICD-10-CM | POA: Diagnosis not present

## 2011-10-05 DIAGNOSIS — F329 Major depressive disorder, single episode, unspecified: Secondary | ICD-10-CM | POA: Diagnosis not present

## 2011-10-05 DIAGNOSIS — D473 Essential (hemorrhagic) thrombocythemia: Secondary | ICD-10-CM | POA: Diagnosis not present

## 2011-10-07 DIAGNOSIS — R1031 Right lower quadrant pain: Secondary | ICD-10-CM | POA: Diagnosis not present

## 2011-10-07 DIAGNOSIS — D473 Essential (hemorrhagic) thrombocythemia: Secondary | ICD-10-CM | POA: Diagnosis not present

## 2011-10-07 DIAGNOSIS — F329 Major depressive disorder, single episode, unspecified: Secondary | ICD-10-CM | POA: Diagnosis not present

## 2011-10-07 DIAGNOSIS — Z794 Long term (current) use of insulin: Secondary | ICD-10-CM | POA: Diagnosis not present

## 2011-10-07 DIAGNOSIS — Z7901 Long term (current) use of anticoagulants: Secondary | ICD-10-CM | POA: Diagnosis not present

## 2011-10-14 DIAGNOSIS — R1031 Right lower quadrant pain: Secondary | ICD-10-CM | POA: Diagnosis not present

## 2011-10-14 DIAGNOSIS — F329 Major depressive disorder, single episode, unspecified: Secondary | ICD-10-CM | POA: Diagnosis not present

## 2011-10-14 DIAGNOSIS — D473 Essential (hemorrhagic) thrombocythemia: Secondary | ICD-10-CM | POA: Diagnosis not present

## 2011-10-14 DIAGNOSIS — Z7901 Long term (current) use of anticoagulants: Secondary | ICD-10-CM | POA: Diagnosis not present

## 2011-10-14 DIAGNOSIS — Z794 Long term (current) use of insulin: Secondary | ICD-10-CM | POA: Diagnosis not present

## 2011-10-19 DIAGNOSIS — Z7901 Long term (current) use of anticoagulants: Secondary | ICD-10-CM | POA: Diagnosis not present

## 2011-10-19 DIAGNOSIS — R1031 Right lower quadrant pain: Secondary | ICD-10-CM | POA: Diagnosis not present

## 2011-10-19 DIAGNOSIS — D473 Essential (hemorrhagic) thrombocythemia: Secondary | ICD-10-CM | POA: Diagnosis not present

## 2011-10-19 DIAGNOSIS — Z794 Long term (current) use of insulin: Secondary | ICD-10-CM | POA: Diagnosis not present

## 2011-10-19 DIAGNOSIS — F329 Major depressive disorder, single episode, unspecified: Secondary | ICD-10-CM | POA: Diagnosis not present

## 2011-10-20 DIAGNOSIS — Z9119 Patient's noncompliance with other medical treatment and regimen: Secondary | ICD-10-CM | POA: Diagnosis not present

## 2011-10-20 DIAGNOSIS — D473 Essential (hemorrhagic) thrombocythemia: Secondary | ICD-10-CM | POA: Diagnosis not present

## 2011-10-26 DIAGNOSIS — I10 Essential (primary) hypertension: Secondary | ICD-10-CM | POA: Diagnosis not present

## 2011-10-26 DIAGNOSIS — I6789 Other cerebrovascular disease: Secondary | ICD-10-CM | POA: Diagnosis not present

## 2011-10-26 DIAGNOSIS — E785 Hyperlipidemia, unspecified: Secondary | ICD-10-CM | POA: Diagnosis not present

## 2011-10-26 DIAGNOSIS — IMO0001 Reserved for inherently not codable concepts without codable children: Secondary | ICD-10-CM | POA: Diagnosis not present

## 2011-11-01 DIAGNOSIS — Z794 Long term (current) use of insulin: Secondary | ICD-10-CM | POA: Diagnosis not present

## 2011-11-01 DIAGNOSIS — Z7901 Long term (current) use of anticoagulants: Secondary | ICD-10-CM | POA: Diagnosis not present

## 2011-11-01 DIAGNOSIS — I10 Essential (primary) hypertension: Secondary | ICD-10-CM | POA: Diagnosis not present

## 2011-11-01 DIAGNOSIS — R471 Dysarthria and anarthria: Secondary | ICD-10-CM | POA: Diagnosis not present

## 2011-11-01 DIAGNOSIS — Z9119 Patient's noncompliance with other medical treatment and regimen: Secondary | ICD-10-CM | POA: Diagnosis not present

## 2011-11-01 DIAGNOSIS — R110 Nausea: Secondary | ICD-10-CM | POA: Diagnosis not present

## 2011-11-01 DIAGNOSIS — D473 Essential (hemorrhagic) thrombocythemia: Secondary | ICD-10-CM | POA: Diagnosis not present

## 2011-11-01 DIAGNOSIS — I6350 Cerebral infarction due to unspecified occlusion or stenosis of unspecified cerebral artery: Secondary | ICD-10-CM | POA: Diagnosis not present

## 2011-11-01 DIAGNOSIS — I7789 Other specified disorders of arteries and arterioles: Secondary | ICD-10-CM | POA: Diagnosis not present

## 2011-11-01 DIAGNOSIS — E119 Type 2 diabetes mellitus without complications: Secondary | ICD-10-CM | POA: Diagnosis not present

## 2011-11-01 DIAGNOSIS — G8929 Other chronic pain: Secondary | ICD-10-CM | POA: Diagnosis not present

## 2011-11-01 DIAGNOSIS — R4182 Altered mental status, unspecified: Secondary | ICD-10-CM | POA: Diagnosis not present

## 2011-11-01 DIAGNOSIS — R1310 Dysphagia, unspecified: Secondary | ICD-10-CM | POA: Diagnosis not present

## 2011-11-01 DIAGNOSIS — I6789 Other cerebrovascular disease: Secondary | ICD-10-CM | POA: Diagnosis not present

## 2011-11-01 DIAGNOSIS — E785 Hyperlipidemia, unspecified: Secondary | ICD-10-CM | POA: Diagnosis not present

## 2011-11-01 DIAGNOSIS — I6340 Cerebral infarction due to embolism of unspecified cerebral artery: Secondary | ICD-10-CM | POA: Diagnosis not present

## 2011-11-03 DIAGNOSIS — E785 Hyperlipidemia, unspecified: Secondary | ICD-10-CM | POA: Diagnosis not present

## 2011-11-03 DIAGNOSIS — Z794 Long term (current) use of insulin: Secondary | ICD-10-CM | POA: Diagnosis not present

## 2011-11-03 DIAGNOSIS — Z7901 Long term (current) use of anticoagulants: Secondary | ICD-10-CM | POA: Diagnosis not present

## 2011-11-03 DIAGNOSIS — Z8673 Personal history of transient ischemic attack (TIA), and cerebral infarction without residual deficits: Secondary | ICD-10-CM | POA: Diagnosis not present

## 2011-11-03 DIAGNOSIS — I10 Essential (primary) hypertension: Secondary | ICD-10-CM | POA: Diagnosis not present

## 2011-11-03 DIAGNOSIS — K219 Gastro-esophageal reflux disease without esophagitis: Secondary | ICD-10-CM | POA: Diagnosis not present

## 2011-11-03 DIAGNOSIS — Z5181 Encounter for therapeutic drug level monitoring: Secondary | ICD-10-CM | POA: Diagnosis not present

## 2011-11-07 DIAGNOSIS — E785 Hyperlipidemia, unspecified: Secondary | ICD-10-CM | POA: Diagnosis not present

## 2011-11-07 DIAGNOSIS — Z5181 Encounter for therapeutic drug level monitoring: Secondary | ICD-10-CM | POA: Diagnosis not present

## 2011-11-07 DIAGNOSIS — I10 Essential (primary) hypertension: Secondary | ICD-10-CM | POA: Diagnosis not present

## 2011-11-07 DIAGNOSIS — K219 Gastro-esophageal reflux disease without esophagitis: Secondary | ICD-10-CM | POA: Diagnosis not present

## 2011-11-07 DIAGNOSIS — Z7901 Long term (current) use of anticoagulants: Secondary | ICD-10-CM | POA: Diagnosis not present

## 2011-11-11 DIAGNOSIS — I10 Essential (primary) hypertension: Secondary | ICD-10-CM | POA: Diagnosis not present

## 2011-11-11 DIAGNOSIS — E785 Hyperlipidemia, unspecified: Secondary | ICD-10-CM | POA: Diagnosis not present

## 2011-11-11 DIAGNOSIS — Z5181 Encounter for therapeutic drug level monitoring: Secondary | ICD-10-CM | POA: Diagnosis not present

## 2011-11-11 DIAGNOSIS — K219 Gastro-esophageal reflux disease without esophagitis: Secondary | ICD-10-CM | POA: Diagnosis not present

## 2011-11-11 DIAGNOSIS — Z7901 Long term (current) use of anticoagulants: Secondary | ICD-10-CM | POA: Diagnosis not present

## 2011-11-17 DIAGNOSIS — R51 Headache: Secondary | ICD-10-CM | POA: Diagnosis not present

## 2011-11-17 DIAGNOSIS — M949 Disorder of cartilage, unspecified: Secondary | ICD-10-CM | POA: Diagnosis not present

## 2011-11-17 DIAGNOSIS — M899 Disorder of bone, unspecified: Secondary | ICD-10-CM | POA: Diagnosis not present

## 2011-11-17 DIAGNOSIS — G8929 Other chronic pain: Secondary | ICD-10-CM | POA: Diagnosis not present

## 2011-11-17 DIAGNOSIS — Z9119 Patient's noncompliance with other medical treatment and regimen: Secondary | ICD-10-CM | POA: Diagnosis not present

## 2011-11-17 DIAGNOSIS — R161 Splenomegaly, not elsewhere classified: Secondary | ICD-10-CM | POA: Diagnosis not present

## 2011-11-17 DIAGNOSIS — Z8673 Personal history of transient ischemic attack (TIA), and cerebral infarction without residual deficits: Secondary | ICD-10-CM | POA: Diagnosis not present

## 2011-11-17 DIAGNOSIS — D473 Essential (hemorrhagic) thrombocythemia: Secondary | ICD-10-CM | POA: Diagnosis not present

## 2011-11-18 DIAGNOSIS — E785 Hyperlipidemia, unspecified: Secondary | ICD-10-CM | POA: Diagnosis not present

## 2011-11-18 DIAGNOSIS — Z5181 Encounter for therapeutic drug level monitoring: Secondary | ICD-10-CM | POA: Diagnosis not present

## 2011-11-18 DIAGNOSIS — I10 Essential (primary) hypertension: Secondary | ICD-10-CM | POA: Diagnosis not present

## 2011-11-18 DIAGNOSIS — E1039 Type 1 diabetes mellitus with other diabetic ophthalmic complication: Secondary | ICD-10-CM | POA: Diagnosis not present

## 2011-11-18 DIAGNOSIS — K219 Gastro-esophageal reflux disease without esophagitis: Secondary | ICD-10-CM | POA: Diagnosis not present

## 2011-11-18 DIAGNOSIS — Z7901 Long term (current) use of anticoagulants: Secondary | ICD-10-CM | POA: Diagnosis not present

## 2011-11-21 DIAGNOSIS — Z7901 Long term (current) use of anticoagulants: Secondary | ICD-10-CM | POA: Diagnosis not present

## 2011-11-21 DIAGNOSIS — E785 Hyperlipidemia, unspecified: Secondary | ICD-10-CM | POA: Diagnosis not present

## 2011-11-21 DIAGNOSIS — I10 Essential (primary) hypertension: Secondary | ICD-10-CM | POA: Diagnosis not present

## 2011-11-21 DIAGNOSIS — Z5181 Encounter for therapeutic drug level monitoring: Secondary | ICD-10-CM | POA: Diagnosis not present

## 2011-11-21 DIAGNOSIS — K219 Gastro-esophageal reflux disease without esophagitis: Secondary | ICD-10-CM | POA: Diagnosis not present

## 2011-11-24 DIAGNOSIS — Z5181 Encounter for therapeutic drug level monitoring: Secondary | ICD-10-CM | POA: Diagnosis not present

## 2011-11-24 DIAGNOSIS — Z7901 Long term (current) use of anticoagulants: Secondary | ICD-10-CM | POA: Diagnosis not present

## 2011-11-24 DIAGNOSIS — I10 Essential (primary) hypertension: Secondary | ICD-10-CM | POA: Diagnosis not present

## 2011-11-24 DIAGNOSIS — K219 Gastro-esophageal reflux disease without esophagitis: Secondary | ICD-10-CM | POA: Diagnosis not present

## 2011-11-24 DIAGNOSIS — E785 Hyperlipidemia, unspecified: Secondary | ICD-10-CM | POA: Diagnosis not present

## 2011-11-26 DIAGNOSIS — I10 Essential (primary) hypertension: Secondary | ICD-10-CM | POA: Diagnosis not present

## 2011-11-26 DIAGNOSIS — Z7901 Long term (current) use of anticoagulants: Secondary | ICD-10-CM | POA: Diagnosis not present

## 2011-11-26 DIAGNOSIS — Z5181 Encounter for therapeutic drug level monitoring: Secondary | ICD-10-CM | POA: Diagnosis not present

## 2011-11-26 DIAGNOSIS — E785 Hyperlipidemia, unspecified: Secondary | ICD-10-CM | POA: Diagnosis not present

## 2011-11-26 DIAGNOSIS — K219 Gastro-esophageal reflux disease without esophagitis: Secondary | ICD-10-CM | POA: Diagnosis not present

## 2011-11-28 DIAGNOSIS — E785 Hyperlipidemia, unspecified: Secondary | ICD-10-CM | POA: Diagnosis not present

## 2011-11-28 DIAGNOSIS — K219 Gastro-esophageal reflux disease without esophagitis: Secondary | ICD-10-CM | POA: Diagnosis not present

## 2011-11-28 DIAGNOSIS — Z7901 Long term (current) use of anticoagulants: Secondary | ICD-10-CM | POA: Diagnosis not present

## 2011-11-28 DIAGNOSIS — I10 Essential (primary) hypertension: Secondary | ICD-10-CM | POA: Diagnosis not present

## 2011-11-28 DIAGNOSIS — Z5181 Encounter for therapeutic drug level monitoring: Secondary | ICD-10-CM | POA: Diagnosis not present

## 2011-11-29 DIAGNOSIS — I10 Essential (primary) hypertension: Secondary | ICD-10-CM | POA: Diagnosis not present

## 2011-11-29 DIAGNOSIS — E785 Hyperlipidemia, unspecified: Secondary | ICD-10-CM | POA: Diagnosis not present

## 2011-11-29 DIAGNOSIS — Z5181 Encounter for therapeutic drug level monitoring: Secondary | ICD-10-CM | POA: Diagnosis not present

## 2011-11-29 DIAGNOSIS — Z7901 Long term (current) use of anticoagulants: Secondary | ICD-10-CM | POA: Diagnosis not present

## 2011-11-29 DIAGNOSIS — K219 Gastro-esophageal reflux disease without esophagitis: Secondary | ICD-10-CM | POA: Diagnosis not present

## 2011-12-01 DIAGNOSIS — K219 Gastro-esophageal reflux disease without esophagitis: Secondary | ICD-10-CM | POA: Diagnosis not present

## 2011-12-01 DIAGNOSIS — I10 Essential (primary) hypertension: Secondary | ICD-10-CM | POA: Diagnosis not present

## 2011-12-01 DIAGNOSIS — Z7901 Long term (current) use of anticoagulants: Secondary | ICD-10-CM | POA: Diagnosis not present

## 2011-12-01 DIAGNOSIS — E785 Hyperlipidemia, unspecified: Secondary | ICD-10-CM | POA: Diagnosis not present

## 2011-12-01 DIAGNOSIS — Z5181 Encounter for therapeutic drug level monitoring: Secondary | ICD-10-CM | POA: Diagnosis not present

## 2011-12-05 DIAGNOSIS — Z7901 Long term (current) use of anticoagulants: Secondary | ICD-10-CM | POA: Diagnosis not present

## 2011-12-05 DIAGNOSIS — E785 Hyperlipidemia, unspecified: Secondary | ICD-10-CM | POA: Diagnosis not present

## 2011-12-05 DIAGNOSIS — I10 Essential (primary) hypertension: Secondary | ICD-10-CM | POA: Diagnosis not present

## 2011-12-05 DIAGNOSIS — K219 Gastro-esophageal reflux disease without esophagitis: Secondary | ICD-10-CM | POA: Diagnosis not present

## 2011-12-05 DIAGNOSIS — Z5181 Encounter for therapeutic drug level monitoring: Secondary | ICD-10-CM | POA: Diagnosis not present

## 2011-12-09 DIAGNOSIS — Z5181 Encounter for therapeutic drug level monitoring: Secondary | ICD-10-CM | POA: Diagnosis not present

## 2011-12-09 DIAGNOSIS — I10 Essential (primary) hypertension: Secondary | ICD-10-CM | POA: Diagnosis not present

## 2011-12-09 DIAGNOSIS — E785 Hyperlipidemia, unspecified: Secondary | ICD-10-CM | POA: Diagnosis not present

## 2011-12-09 DIAGNOSIS — K219 Gastro-esophageal reflux disease without esophagitis: Secondary | ICD-10-CM | POA: Diagnosis not present

## 2011-12-09 DIAGNOSIS — Z7901 Long term (current) use of anticoagulants: Secondary | ICD-10-CM | POA: Diagnosis not present

## 2011-12-12 DIAGNOSIS — Z5181 Encounter for therapeutic drug level monitoring: Secondary | ICD-10-CM | POA: Diagnosis not present

## 2011-12-12 DIAGNOSIS — E785 Hyperlipidemia, unspecified: Secondary | ICD-10-CM | POA: Diagnosis not present

## 2011-12-12 DIAGNOSIS — I10 Essential (primary) hypertension: Secondary | ICD-10-CM | POA: Diagnosis not present

## 2011-12-12 DIAGNOSIS — K219 Gastro-esophageal reflux disease without esophagitis: Secondary | ICD-10-CM | POA: Diagnosis not present

## 2011-12-12 DIAGNOSIS — Z7901 Long term (current) use of anticoagulants: Secondary | ICD-10-CM | POA: Diagnosis not present

## 2011-12-23 DIAGNOSIS — K219 Gastro-esophageal reflux disease without esophagitis: Secondary | ICD-10-CM | POA: Diagnosis not present

## 2011-12-23 DIAGNOSIS — I10 Essential (primary) hypertension: Secondary | ICD-10-CM | POA: Diagnosis not present

## 2011-12-23 DIAGNOSIS — E785 Hyperlipidemia, unspecified: Secondary | ICD-10-CM | POA: Diagnosis not present

## 2011-12-23 DIAGNOSIS — Z5181 Encounter for therapeutic drug level monitoring: Secondary | ICD-10-CM | POA: Diagnosis not present

## 2011-12-23 DIAGNOSIS — Z7901 Long term (current) use of anticoagulants: Secondary | ICD-10-CM | POA: Diagnosis not present

## 2011-12-30 DIAGNOSIS — Z7901 Long term (current) use of anticoagulants: Secondary | ICD-10-CM | POA: Diagnosis not present

## 2011-12-30 DIAGNOSIS — K219 Gastro-esophageal reflux disease without esophagitis: Secondary | ICD-10-CM | POA: Diagnosis not present

## 2011-12-30 DIAGNOSIS — Z5181 Encounter for therapeutic drug level monitoring: Secondary | ICD-10-CM | POA: Diagnosis not present

## 2011-12-30 DIAGNOSIS — I10 Essential (primary) hypertension: Secondary | ICD-10-CM | POA: Diagnosis not present

## 2011-12-30 DIAGNOSIS — E785 Hyperlipidemia, unspecified: Secondary | ICD-10-CM | POA: Diagnosis not present

## 2012-01-01 DIAGNOSIS — E785 Hyperlipidemia, unspecified: Secondary | ICD-10-CM | POA: Diagnosis not present

## 2012-01-01 DIAGNOSIS — K219 Gastro-esophageal reflux disease without esophagitis: Secondary | ICD-10-CM | POA: Diagnosis not present

## 2012-01-01 DIAGNOSIS — I10 Essential (primary) hypertension: Secondary | ICD-10-CM | POA: Diagnosis not present

## 2012-01-01 DIAGNOSIS — Z7901 Long term (current) use of anticoagulants: Secondary | ICD-10-CM | POA: Diagnosis not present

## 2012-01-01 DIAGNOSIS — Z5181 Encounter for therapeutic drug level monitoring: Secondary | ICD-10-CM | POA: Diagnosis not present

## 2012-01-02 DIAGNOSIS — I10 Essential (primary) hypertension: Secondary | ICD-10-CM | POA: Diagnosis not present

## 2012-01-02 DIAGNOSIS — Z794 Long term (current) use of insulin: Secondary | ICD-10-CM | POA: Diagnosis not present

## 2012-01-02 DIAGNOSIS — Z5181 Encounter for therapeutic drug level monitoring: Secondary | ICD-10-CM | POA: Diagnosis not present

## 2012-01-02 DIAGNOSIS — Z7901 Long term (current) use of anticoagulants: Secondary | ICD-10-CM | POA: Diagnosis not present

## 2012-01-02 DIAGNOSIS — Z8673 Personal history of transient ischemic attack (TIA), and cerebral infarction without residual deficits: Secondary | ICD-10-CM | POA: Diagnosis not present

## 2012-01-02 DIAGNOSIS — K219 Gastro-esophageal reflux disease without esophagitis: Secondary | ICD-10-CM | POA: Diagnosis not present

## 2012-01-02 DIAGNOSIS — E785 Hyperlipidemia, unspecified: Secondary | ICD-10-CM | POA: Diagnosis not present

## 2012-01-02 DIAGNOSIS — IMO0001 Reserved for inherently not codable concepts without codable children: Secondary | ICD-10-CM | POA: Diagnosis not present

## 2012-01-03 DIAGNOSIS — Z5181 Encounter for therapeutic drug level monitoring: Secondary | ICD-10-CM | POA: Diagnosis not present

## 2012-01-03 DIAGNOSIS — I10 Essential (primary) hypertension: Secondary | ICD-10-CM | POA: Diagnosis not present

## 2012-01-03 DIAGNOSIS — Z8673 Personal history of transient ischemic attack (TIA), and cerebral infarction without residual deficits: Secondary | ICD-10-CM | POA: Diagnosis not present

## 2012-01-03 DIAGNOSIS — K219 Gastro-esophageal reflux disease without esophagitis: Secondary | ICD-10-CM | POA: Diagnosis not present

## 2012-01-03 DIAGNOSIS — E785 Hyperlipidemia, unspecified: Secondary | ICD-10-CM | POA: Diagnosis not present

## 2012-01-05 DIAGNOSIS — E785 Hyperlipidemia, unspecified: Secondary | ICD-10-CM | POA: Diagnosis not present

## 2012-01-05 DIAGNOSIS — K219 Gastro-esophageal reflux disease without esophagitis: Secondary | ICD-10-CM | POA: Diagnosis not present

## 2012-01-05 DIAGNOSIS — Z5181 Encounter for therapeutic drug level monitoring: Secondary | ICD-10-CM | POA: Diagnosis not present

## 2012-01-05 DIAGNOSIS — Z8673 Personal history of transient ischemic attack (TIA), and cerebral infarction without residual deficits: Secondary | ICD-10-CM | POA: Diagnosis not present

## 2012-01-05 DIAGNOSIS — I10 Essential (primary) hypertension: Secondary | ICD-10-CM | POA: Diagnosis not present

## 2012-01-07 DIAGNOSIS — Z7901 Long term (current) use of anticoagulants: Secondary | ICD-10-CM | POA: Diagnosis not present

## 2012-01-07 DIAGNOSIS — I10 Essential (primary) hypertension: Secondary | ICD-10-CM | POA: Diagnosis not present

## 2012-01-07 DIAGNOSIS — K219 Gastro-esophageal reflux disease without esophagitis: Secondary | ICD-10-CM | POA: Diagnosis not present

## 2012-01-07 DIAGNOSIS — E785 Hyperlipidemia, unspecified: Secondary | ICD-10-CM | POA: Diagnosis not present

## 2012-01-07 DIAGNOSIS — Z8673 Personal history of transient ischemic attack (TIA), and cerebral infarction without residual deficits: Secondary | ICD-10-CM | POA: Diagnosis not present

## 2012-01-07 DIAGNOSIS — Z5181 Encounter for therapeutic drug level monitoring: Secondary | ICD-10-CM | POA: Diagnosis not present

## 2012-01-10 DIAGNOSIS — Z8673 Personal history of transient ischemic attack (TIA), and cerebral infarction without residual deficits: Secondary | ICD-10-CM | POA: Diagnosis not present

## 2012-01-10 DIAGNOSIS — Z5181 Encounter for therapeutic drug level monitoring: Secondary | ICD-10-CM | POA: Diagnosis not present

## 2012-01-10 DIAGNOSIS — I10 Essential (primary) hypertension: Secondary | ICD-10-CM | POA: Diagnosis not present

## 2012-01-10 DIAGNOSIS — E785 Hyperlipidemia, unspecified: Secondary | ICD-10-CM | POA: Diagnosis not present

## 2012-01-10 DIAGNOSIS — K219 Gastro-esophageal reflux disease without esophagitis: Secondary | ICD-10-CM | POA: Diagnosis not present

## 2012-01-13 DIAGNOSIS — Z8673 Personal history of transient ischemic attack (TIA), and cerebral infarction without residual deficits: Secondary | ICD-10-CM | POA: Diagnosis not present

## 2012-01-13 DIAGNOSIS — M949 Disorder of cartilage, unspecified: Secondary | ICD-10-CM | POA: Diagnosis not present

## 2012-01-13 DIAGNOSIS — R161 Splenomegaly, not elsewhere classified: Secondary | ICD-10-CM | POA: Diagnosis not present

## 2012-01-13 DIAGNOSIS — M899 Disorder of bone, unspecified: Secondary | ICD-10-CM | POA: Diagnosis not present

## 2012-01-13 DIAGNOSIS — D473 Essential (hemorrhagic) thrombocythemia: Secondary | ICD-10-CM | POA: Diagnosis not present

## 2012-01-13 DIAGNOSIS — G8929 Other chronic pain: Secondary | ICD-10-CM | POA: Diagnosis not present

## 2012-01-17 DIAGNOSIS — Z5181 Encounter for therapeutic drug level monitoring: Secondary | ICD-10-CM | POA: Diagnosis not present

## 2012-01-17 DIAGNOSIS — I10 Essential (primary) hypertension: Secondary | ICD-10-CM | POA: Diagnosis not present

## 2012-01-17 DIAGNOSIS — K219 Gastro-esophageal reflux disease without esophagitis: Secondary | ICD-10-CM | POA: Diagnosis not present

## 2012-01-17 DIAGNOSIS — Z8673 Personal history of transient ischemic attack (TIA), and cerebral infarction without residual deficits: Secondary | ICD-10-CM | POA: Diagnosis not present

## 2012-01-17 DIAGNOSIS — E785 Hyperlipidemia, unspecified: Secondary | ICD-10-CM | POA: Diagnosis not present

## 2012-01-24 DIAGNOSIS — K219 Gastro-esophageal reflux disease without esophagitis: Secondary | ICD-10-CM | POA: Diagnosis not present

## 2012-01-24 DIAGNOSIS — Z5181 Encounter for therapeutic drug level monitoring: Secondary | ICD-10-CM | POA: Diagnosis not present

## 2012-01-24 DIAGNOSIS — Z8673 Personal history of transient ischemic attack (TIA), and cerebral infarction without residual deficits: Secondary | ICD-10-CM | POA: Diagnosis not present

## 2012-01-24 DIAGNOSIS — I10 Essential (primary) hypertension: Secondary | ICD-10-CM | POA: Diagnosis not present

## 2012-01-24 DIAGNOSIS — E785 Hyperlipidemia, unspecified: Secondary | ICD-10-CM | POA: Diagnosis not present

## 2012-01-31 DIAGNOSIS — Z8673 Personal history of transient ischemic attack (TIA), and cerebral infarction without residual deficits: Secondary | ICD-10-CM | POA: Diagnosis not present

## 2012-01-31 DIAGNOSIS — Z5181 Encounter for therapeutic drug level monitoring: Secondary | ICD-10-CM | POA: Diagnosis not present

## 2012-01-31 DIAGNOSIS — K219 Gastro-esophageal reflux disease without esophagitis: Secondary | ICD-10-CM | POA: Diagnosis not present

## 2012-01-31 DIAGNOSIS — E785 Hyperlipidemia, unspecified: Secondary | ICD-10-CM | POA: Diagnosis not present

## 2012-01-31 DIAGNOSIS — I10 Essential (primary) hypertension: Secondary | ICD-10-CM | POA: Diagnosis not present

## 2012-02-07 DIAGNOSIS — K219 Gastro-esophageal reflux disease without esophagitis: Secondary | ICD-10-CM | POA: Diagnosis not present

## 2012-02-07 DIAGNOSIS — Z8673 Personal history of transient ischemic attack (TIA), and cerebral infarction without residual deficits: Secondary | ICD-10-CM | POA: Diagnosis not present

## 2012-02-07 DIAGNOSIS — E785 Hyperlipidemia, unspecified: Secondary | ICD-10-CM | POA: Diagnosis not present

## 2012-02-07 DIAGNOSIS — I10 Essential (primary) hypertension: Secondary | ICD-10-CM | POA: Diagnosis not present

## 2012-02-07 DIAGNOSIS — Z5181 Encounter for therapeutic drug level monitoring: Secondary | ICD-10-CM | POA: Diagnosis not present

## 2012-02-14 DIAGNOSIS — E785 Hyperlipidemia, unspecified: Secondary | ICD-10-CM | POA: Diagnosis not present

## 2012-02-14 DIAGNOSIS — K219 Gastro-esophageal reflux disease without esophagitis: Secondary | ICD-10-CM | POA: Diagnosis not present

## 2012-02-14 DIAGNOSIS — I10 Essential (primary) hypertension: Secondary | ICD-10-CM | POA: Diagnosis not present

## 2012-02-14 DIAGNOSIS — Z5181 Encounter for therapeutic drug level monitoring: Secondary | ICD-10-CM | POA: Diagnosis not present

## 2012-02-14 DIAGNOSIS — Z8673 Personal history of transient ischemic attack (TIA), and cerebral infarction without residual deficits: Secondary | ICD-10-CM | POA: Diagnosis not present

## 2012-02-21 DIAGNOSIS — K219 Gastro-esophageal reflux disease without esophagitis: Secondary | ICD-10-CM | POA: Diagnosis not present

## 2012-02-21 DIAGNOSIS — Z8673 Personal history of transient ischemic attack (TIA), and cerebral infarction without residual deficits: Secondary | ICD-10-CM | POA: Diagnosis not present

## 2012-02-21 DIAGNOSIS — I10 Essential (primary) hypertension: Secondary | ICD-10-CM | POA: Diagnosis not present

## 2012-02-21 DIAGNOSIS — Z5181 Encounter for therapeutic drug level monitoring: Secondary | ICD-10-CM | POA: Diagnosis not present

## 2012-02-21 DIAGNOSIS — E785 Hyperlipidemia, unspecified: Secondary | ICD-10-CM | POA: Diagnosis not present

## 2012-02-27 DIAGNOSIS — K219 Gastro-esophageal reflux disease without esophagitis: Secondary | ICD-10-CM | POA: Diagnosis not present

## 2012-02-27 DIAGNOSIS — E785 Hyperlipidemia, unspecified: Secondary | ICD-10-CM | POA: Diagnosis not present

## 2012-02-27 DIAGNOSIS — Z5181 Encounter for therapeutic drug level monitoring: Secondary | ICD-10-CM | POA: Diagnosis not present

## 2012-02-27 DIAGNOSIS — Z8673 Personal history of transient ischemic attack (TIA), and cerebral infarction without residual deficits: Secondary | ICD-10-CM | POA: Diagnosis not present

## 2012-02-27 DIAGNOSIS — I10 Essential (primary) hypertension: Secondary | ICD-10-CM | POA: Diagnosis not present

## 2012-03-01 DIAGNOSIS — M79609 Pain in unspecified limb: Secondary | ICD-10-CM | POA: Diagnosis not present

## 2012-03-01 DIAGNOSIS — D649 Anemia, unspecified: Secondary | ICD-10-CM | POA: Diagnosis not present

## 2012-03-01 DIAGNOSIS — D473 Essential (hemorrhagic) thrombocythemia: Secondary | ICD-10-CM | POA: Diagnosis not present

## 2012-03-02 DIAGNOSIS — Z794 Long term (current) use of insulin: Secondary | ICD-10-CM | POA: Diagnosis not present

## 2012-03-02 DIAGNOSIS — I10 Essential (primary) hypertension: Secondary | ICD-10-CM | POA: Diagnosis not present

## 2012-03-02 DIAGNOSIS — D47Z9 Other specified neoplasms of uncertain behavior of lymphoid, hematopoietic and related tissue: Secondary | ICD-10-CM | POA: Diagnosis not present

## 2012-03-02 DIAGNOSIS — Z7901 Long term (current) use of anticoagulants: Secondary | ICD-10-CM | POA: Diagnosis not present

## 2012-03-02 DIAGNOSIS — E785 Hyperlipidemia, unspecified: Secondary | ICD-10-CM | POA: Diagnosis not present

## 2012-03-02 DIAGNOSIS — K219 Gastro-esophageal reflux disease without esophagitis: Secondary | ICD-10-CM | POA: Diagnosis not present

## 2012-03-02 DIAGNOSIS — Z8673 Personal history of transient ischemic attack (TIA), and cerebral infarction without residual deficits: Secondary | ICD-10-CM | POA: Diagnosis not present

## 2012-03-05 DIAGNOSIS — D47Z9 Other specified neoplasms of uncertain behavior of lymphoid, hematopoietic and related tissue: Secondary | ICD-10-CM | POA: Diagnosis not present

## 2012-03-05 DIAGNOSIS — I10 Essential (primary) hypertension: Secondary | ICD-10-CM | POA: Diagnosis not present

## 2012-03-05 DIAGNOSIS — E785 Hyperlipidemia, unspecified: Secondary | ICD-10-CM | POA: Diagnosis not present

## 2012-03-05 DIAGNOSIS — K219 Gastro-esophageal reflux disease without esophagitis: Secondary | ICD-10-CM | POA: Diagnosis not present

## 2012-03-05 DIAGNOSIS — Z7901 Long term (current) use of anticoagulants: Secondary | ICD-10-CM | POA: Diagnosis not present

## 2012-03-12 DIAGNOSIS — Z7901 Long term (current) use of anticoagulants: Secondary | ICD-10-CM | POA: Diagnosis not present

## 2012-03-12 DIAGNOSIS — K219 Gastro-esophageal reflux disease without esophagitis: Secondary | ICD-10-CM | POA: Diagnosis not present

## 2012-03-12 DIAGNOSIS — D47Z9 Other specified neoplasms of uncertain behavior of lymphoid, hematopoietic and related tissue: Secondary | ICD-10-CM | POA: Diagnosis not present

## 2012-03-12 DIAGNOSIS — E785 Hyperlipidemia, unspecified: Secondary | ICD-10-CM | POA: Diagnosis not present

## 2012-03-12 DIAGNOSIS — I10 Essential (primary) hypertension: Secondary | ICD-10-CM | POA: Diagnosis not present

## 2012-03-19 DIAGNOSIS — D47Z9 Other specified neoplasms of uncertain behavior of lymphoid, hematopoietic and related tissue: Secondary | ICD-10-CM | POA: Diagnosis not present

## 2012-03-19 DIAGNOSIS — Z7901 Long term (current) use of anticoagulants: Secondary | ICD-10-CM | POA: Diagnosis not present

## 2012-03-19 DIAGNOSIS — E785 Hyperlipidemia, unspecified: Secondary | ICD-10-CM | POA: Diagnosis not present

## 2012-03-19 DIAGNOSIS — I10 Essential (primary) hypertension: Secondary | ICD-10-CM | POA: Diagnosis not present

## 2012-03-19 DIAGNOSIS — K219 Gastro-esophageal reflux disease without esophagitis: Secondary | ICD-10-CM | POA: Diagnosis not present

## 2012-03-26 DIAGNOSIS — D47Z9 Other specified neoplasms of uncertain behavior of lymphoid, hematopoietic and related tissue: Secondary | ICD-10-CM | POA: Diagnosis not present

## 2012-03-26 DIAGNOSIS — D649 Anemia, unspecified: Secondary | ICD-10-CM | POA: Diagnosis not present

## 2012-03-26 DIAGNOSIS — E785 Hyperlipidemia, unspecified: Secondary | ICD-10-CM | POA: Diagnosis not present

## 2012-03-26 DIAGNOSIS — R109 Unspecified abdominal pain: Secondary | ICD-10-CM | POA: Diagnosis not present

## 2012-03-26 DIAGNOSIS — Z7901 Long term (current) use of anticoagulants: Secondary | ICD-10-CM | POA: Diagnosis not present

## 2012-03-26 DIAGNOSIS — I10 Essential (primary) hypertension: Secondary | ICD-10-CM | POA: Diagnosis not present

## 2012-03-26 DIAGNOSIS — K219 Gastro-esophageal reflux disease without esophagitis: Secondary | ICD-10-CM | POA: Diagnosis not present

## 2012-03-26 DIAGNOSIS — M79609 Pain in unspecified limb: Secondary | ICD-10-CM | POA: Diagnosis not present

## 2012-03-26 DIAGNOSIS — R112 Nausea with vomiting, unspecified: Secondary | ICD-10-CM | POA: Diagnosis not present

## 2012-03-26 DIAGNOSIS — D473 Essential (hemorrhagic) thrombocythemia: Secondary | ICD-10-CM | POA: Diagnosis not present

## 2012-04-03 DIAGNOSIS — D47Z9 Other specified neoplasms of uncertain behavior of lymphoid, hematopoietic and related tissue: Secondary | ICD-10-CM | POA: Diagnosis not present

## 2012-04-03 DIAGNOSIS — K219 Gastro-esophageal reflux disease without esophagitis: Secondary | ICD-10-CM | POA: Diagnosis not present

## 2012-04-03 DIAGNOSIS — E785 Hyperlipidemia, unspecified: Secondary | ICD-10-CM | POA: Diagnosis not present

## 2012-04-03 DIAGNOSIS — I10 Essential (primary) hypertension: Secondary | ICD-10-CM | POA: Diagnosis not present

## 2012-04-03 DIAGNOSIS — Z7901 Long term (current) use of anticoagulants: Secondary | ICD-10-CM | POA: Diagnosis not present

## 2012-04-11 DIAGNOSIS — K219 Gastro-esophageal reflux disease without esophagitis: Secondary | ICD-10-CM | POA: Diagnosis not present

## 2012-04-13 DIAGNOSIS — R269 Unspecified abnormalities of gait and mobility: Secondary | ICD-10-CM | POA: Diagnosis not present

## 2012-04-13 DIAGNOSIS — I6330 Cerebral infarction due to thrombosis of unspecified cerebral artery: Secondary | ICD-10-CM | POA: Diagnosis not present

## 2012-04-13 DIAGNOSIS — R0989 Other specified symptoms and signs involving the circulatory and respiratory systems: Secondary | ICD-10-CM | POA: Diagnosis not present

## 2012-04-13 DIAGNOSIS — I6789 Other cerebrovascular disease: Secondary | ICD-10-CM | POA: Diagnosis not present

## 2012-04-16 DIAGNOSIS — R0989 Other specified symptoms and signs involving the circulatory and respiratory systems: Secondary | ICD-10-CM | POA: Diagnosis not present

## 2012-04-16 DIAGNOSIS — I6789 Other cerebrovascular disease: Secondary | ICD-10-CM | POA: Diagnosis not present

## 2012-04-16 DIAGNOSIS — I6330 Cerebral infarction due to thrombosis of unspecified cerebral artery: Secondary | ICD-10-CM | POA: Diagnosis not present

## 2012-04-20 DIAGNOSIS — I6789 Other cerebrovascular disease: Secondary | ICD-10-CM | POA: Diagnosis not present

## 2012-04-20 DIAGNOSIS — R0989 Other specified symptoms and signs involving the circulatory and respiratory systems: Secondary | ICD-10-CM | POA: Diagnosis not present

## 2012-04-20 DIAGNOSIS — I6330 Cerebral infarction due to thrombosis of unspecified cerebral artery: Secondary | ICD-10-CM | POA: Diagnosis not present

## 2012-04-20 DIAGNOSIS — R269 Unspecified abnormalities of gait and mobility: Secondary | ICD-10-CM | POA: Diagnosis not present

## 2012-04-23 DIAGNOSIS — E119 Type 2 diabetes mellitus without complications: Secondary | ICD-10-CM | POA: Diagnosis not present

## 2012-04-23 DIAGNOSIS — Z9119 Patient's noncompliance with other medical treatment and regimen: Secondary | ICD-10-CM | POA: Diagnosis not present

## 2012-04-23 DIAGNOSIS — M79609 Pain in unspecified limb: Secondary | ICD-10-CM | POA: Diagnosis not present

## 2012-04-23 DIAGNOSIS — D473 Essential (hemorrhagic) thrombocythemia: Secondary | ICD-10-CM | POA: Diagnosis not present

## 2012-04-23 DIAGNOSIS — D649 Anemia, unspecified: Secondary | ICD-10-CM | POA: Diagnosis not present

## 2012-05-23 DIAGNOSIS — D649 Anemia, unspecified: Secondary | ICD-10-CM | POA: Diagnosis not present

## 2012-05-23 DIAGNOSIS — I6789 Other cerebrovascular disease: Secondary | ICD-10-CM | POA: Diagnosis not present

## 2012-05-23 DIAGNOSIS — M79609 Pain in unspecified limb: Secondary | ICD-10-CM | POA: Diagnosis not present

## 2012-05-23 DIAGNOSIS — D473 Essential (hemorrhagic) thrombocythemia: Secondary | ICD-10-CM | POA: Diagnosis not present

## 2012-05-23 DIAGNOSIS — I10 Essential (primary) hypertension: Secondary | ICD-10-CM | POA: Diagnosis not present

## 2012-05-23 DIAGNOSIS — E785 Hyperlipidemia, unspecified: Secondary | ICD-10-CM | POA: Diagnosis not present

## 2012-05-29 DIAGNOSIS — I6789 Other cerebrovascular disease: Secondary | ICD-10-CM | POA: Diagnosis not present

## 2012-06-05 DIAGNOSIS — I6789 Other cerebrovascular disease: Secondary | ICD-10-CM | POA: Diagnosis not present

## 2012-06-12 DIAGNOSIS — I6789 Other cerebrovascular disease: Secondary | ICD-10-CM | POA: Diagnosis not present

## 2012-07-02 DIAGNOSIS — I6789 Other cerebrovascular disease: Secondary | ICD-10-CM | POA: Diagnosis not present

## 2012-07-02 DIAGNOSIS — R079 Chest pain, unspecified: Secondary | ICD-10-CM | POA: Diagnosis not present

## 2012-07-02 DIAGNOSIS — F411 Generalized anxiety disorder: Secondary | ICD-10-CM | POA: Diagnosis not present

## 2012-07-16 DIAGNOSIS — D473 Essential (hemorrhagic) thrombocythemia: Secondary | ICD-10-CM | POA: Diagnosis not present

## 2012-07-16 DIAGNOSIS — M79609 Pain in unspecified limb: Secondary | ICD-10-CM | POA: Diagnosis not present

## 2012-07-16 DIAGNOSIS — D649 Anemia, unspecified: Secondary | ICD-10-CM | POA: Diagnosis not present

## 2012-07-20 DIAGNOSIS — I6789 Other cerebrovascular disease: Secondary | ICD-10-CM | POA: Diagnosis not present

## 2012-07-20 DIAGNOSIS — I6330 Cerebral infarction due to thrombosis of unspecified cerebral artery: Secondary | ICD-10-CM | POA: Diagnosis not present

## 2012-07-20 DIAGNOSIS — R269 Unspecified abnormalities of gait and mobility: Secondary | ICD-10-CM | POA: Diagnosis not present

## 2012-07-20 DIAGNOSIS — R0989 Other specified symptoms and signs involving the circulatory and respiratory systems: Secondary | ICD-10-CM | POA: Diagnosis not present

## 2012-08-02 DIAGNOSIS — E119 Type 2 diabetes mellitus without complications: Secondary | ICD-10-CM | POA: Diagnosis not present

## 2012-08-02 DIAGNOSIS — D473 Essential (hemorrhagic) thrombocythemia: Secondary | ICD-10-CM | POA: Diagnosis not present

## 2012-08-02 DIAGNOSIS — E785 Hyperlipidemia, unspecified: Secondary | ICD-10-CM | POA: Diagnosis not present

## 2012-08-02 DIAGNOSIS — I10 Essential (primary) hypertension: Secondary | ICD-10-CM | POA: Diagnosis not present

## 2012-09-17 DIAGNOSIS — R269 Unspecified abnormalities of gait and mobility: Secondary | ICD-10-CM | POA: Diagnosis not present

## 2012-09-17 DIAGNOSIS — I6330 Cerebral infarction due to thrombosis of unspecified cerebral artery: Secondary | ICD-10-CM | POA: Diagnosis not present

## 2012-09-17 DIAGNOSIS — R0989 Other specified symptoms and signs involving the circulatory and respiratory systems: Secondary | ICD-10-CM | POA: Diagnosis not present

## 2012-09-17 DIAGNOSIS — I6789 Other cerebrovascular disease: Secondary | ICD-10-CM | POA: Diagnosis not present

## 2012-09-19 DIAGNOSIS — I6330 Cerebral infarction due to thrombosis of unspecified cerebral artery: Secondary | ICD-10-CM | POA: Diagnosis not present

## 2012-09-19 DIAGNOSIS — I6789 Other cerebrovascular disease: Secondary | ICD-10-CM | POA: Diagnosis not present

## 2012-09-19 DIAGNOSIS — R0989 Other specified symptoms and signs involving the circulatory and respiratory systems: Secondary | ICD-10-CM | POA: Diagnosis not present

## 2012-09-19 DIAGNOSIS — R269 Unspecified abnormalities of gait and mobility: Secondary | ICD-10-CM | POA: Diagnosis not present

## 2012-09-26 DIAGNOSIS — K5900 Constipation, unspecified: Secondary | ICD-10-CM | POA: Diagnosis not present

## 2012-09-28 DIAGNOSIS — R269 Unspecified abnormalities of gait and mobility: Secondary | ICD-10-CM | POA: Diagnosis not present

## 2012-09-28 DIAGNOSIS — I6330 Cerebral infarction due to thrombosis of unspecified cerebral artery: Secondary | ICD-10-CM | POA: Diagnosis not present

## 2012-09-28 DIAGNOSIS — R0989 Other specified symptoms and signs involving the circulatory and respiratory systems: Secondary | ICD-10-CM | POA: Diagnosis not present

## 2012-09-28 DIAGNOSIS — I6789 Other cerebrovascular disease: Secondary | ICD-10-CM | POA: Diagnosis not present

## 2012-09-29 DIAGNOSIS — R161 Splenomegaly, not elsewhere classified: Secondary | ICD-10-CM | POA: Diagnosis not present

## 2012-09-29 DIAGNOSIS — R141 Gas pain: Secondary | ICD-10-CM | POA: Diagnosis not present

## 2012-09-29 DIAGNOSIS — R143 Flatulence: Secondary | ICD-10-CM | POA: Diagnosis not present

## 2012-10-11 DIAGNOSIS — M79609 Pain in unspecified limb: Secondary | ICD-10-CM | POA: Diagnosis not present

## 2012-10-11 DIAGNOSIS — D473 Essential (hemorrhagic) thrombocythemia: Secondary | ICD-10-CM | POA: Diagnosis not present

## 2012-10-11 DIAGNOSIS — Z7901 Long term (current) use of anticoagulants: Secondary | ICD-10-CM | POA: Diagnosis not present

## 2012-10-11 DIAGNOSIS — D649 Anemia, unspecified: Secondary | ICD-10-CM | POA: Diagnosis not present

## 2012-10-18 DIAGNOSIS — D473 Essential (hemorrhagic) thrombocythemia: Secondary | ICD-10-CM | POA: Diagnosis not present

## 2012-10-18 DIAGNOSIS — M79609 Pain in unspecified limb: Secondary | ICD-10-CM | POA: Diagnosis not present

## 2012-10-18 DIAGNOSIS — D649 Anemia, unspecified: Secondary | ICD-10-CM | POA: Diagnosis not present

## 2012-10-18 DIAGNOSIS — Z7901 Long term (current) use of anticoagulants: Secondary | ICD-10-CM | POA: Diagnosis not present

## 2012-10-24 DIAGNOSIS — K3184 Gastroparesis: Secondary | ICD-10-CM | POA: Diagnosis not present

## 2012-10-26 DIAGNOSIS — D473 Essential (hemorrhagic) thrombocythemia: Secondary | ICD-10-CM | POA: Insufficient documentation

## 2012-10-31 DIAGNOSIS — F519 Sleep disorder not due to a substance or known physiological condition, unspecified: Secondary | ICD-10-CM | POA: Diagnosis not present

## 2012-10-31 DIAGNOSIS — I10 Essential (primary) hypertension: Secondary | ICD-10-CM | POA: Diagnosis not present

## 2012-10-31 DIAGNOSIS — D473 Essential (hemorrhagic) thrombocythemia: Secondary | ICD-10-CM | POA: Diagnosis not present

## 2012-11-07 DIAGNOSIS — F519 Sleep disorder not due to a substance or known physiological condition, unspecified: Secondary | ICD-10-CM | POA: Diagnosis not present

## 2012-11-07 DIAGNOSIS — I10 Essential (primary) hypertension: Secondary | ICD-10-CM | POA: Diagnosis not present

## 2012-11-07 DIAGNOSIS — N390 Urinary tract infection, site not specified: Secondary | ICD-10-CM | POA: Diagnosis not present

## 2012-11-12 DIAGNOSIS — D473 Essential (hemorrhagic) thrombocythemia: Secondary | ICD-10-CM | POA: Diagnosis not present

## 2012-11-12 DIAGNOSIS — Z8673 Personal history of transient ischemic attack (TIA), and cerebral infarction without residual deficits: Secondary | ICD-10-CM | POA: Diagnosis not present

## 2012-11-23 DIAGNOSIS — I6789 Other cerebrovascular disease: Secondary | ICD-10-CM | POA: Diagnosis not present

## 2012-11-23 DIAGNOSIS — R269 Unspecified abnormalities of gait and mobility: Secondary | ICD-10-CM | POA: Diagnosis not present

## 2012-11-23 DIAGNOSIS — R0989 Other specified symptoms and signs involving the circulatory and respiratory systems: Secondary | ICD-10-CM | POA: Diagnosis not present

## 2012-11-23 DIAGNOSIS — I6330 Cerebral infarction due to thrombosis of unspecified cerebral artery: Secondary | ICD-10-CM | POA: Diagnosis not present

## 2012-11-26 DIAGNOSIS — N390 Urinary tract infection, site not specified: Secondary | ICD-10-CM | POA: Diagnosis not present

## 2012-11-26 DIAGNOSIS — D473 Essential (hemorrhagic) thrombocythemia: Secondary | ICD-10-CM | POA: Diagnosis not present

## 2012-11-26 DIAGNOSIS — R300 Dysuria: Secondary | ICD-10-CM | POA: Diagnosis not present

## 2012-11-26 DIAGNOSIS — I10 Essential (primary) hypertension: Secondary | ICD-10-CM | POA: Diagnosis not present

## 2012-12-05 DIAGNOSIS — R1310 Dysphagia, unspecified: Secondary | ICD-10-CM | POA: Diagnosis not present

## 2012-12-06 DIAGNOSIS — M949 Disorder of cartilage, unspecified: Secondary | ICD-10-CM | POA: Diagnosis not present

## 2012-12-06 DIAGNOSIS — M2550 Pain in unspecified joint: Secondary | ICD-10-CM | POA: Diagnosis not present

## 2012-12-06 DIAGNOSIS — M899 Disorder of bone, unspecified: Secondary | ICD-10-CM | POA: Diagnosis not present

## 2012-12-06 DIAGNOSIS — D473 Essential (hemorrhagic) thrombocythemia: Secondary | ICD-10-CM | POA: Diagnosis not present

## 2012-12-06 DIAGNOSIS — R161 Splenomegaly, not elsewhere classified: Secondary | ICD-10-CM | POA: Diagnosis not present

## 2012-12-06 DIAGNOSIS — G8929 Other chronic pain: Secondary | ICD-10-CM | POA: Diagnosis not present

## 2012-12-06 DIAGNOSIS — Z8673 Personal history of transient ischemic attack (TIA), and cerebral infarction without residual deficits: Secondary | ICD-10-CM | POA: Diagnosis not present

## 2012-12-10 DIAGNOSIS — I10 Essential (primary) hypertension: Secondary | ICD-10-CM | POA: Diagnosis not present

## 2012-12-10 DIAGNOSIS — F411 Generalized anxiety disorder: Secondary | ICD-10-CM | POA: Diagnosis not present

## 2013-01-09 DIAGNOSIS — I10 Essential (primary) hypertension: Secondary | ICD-10-CM | POA: Diagnosis not present

## 2013-01-09 DIAGNOSIS — R300 Dysuria: Secondary | ICD-10-CM | POA: Diagnosis not present

## 2013-01-17 DIAGNOSIS — R0989 Other specified symptoms and signs involving the circulatory and respiratory systems: Secondary | ICD-10-CM | POA: Diagnosis not present

## 2013-01-17 DIAGNOSIS — R269 Unspecified abnormalities of gait and mobility: Secondary | ICD-10-CM | POA: Diagnosis not present

## 2013-01-17 DIAGNOSIS — I6330 Cerebral infarction due to thrombosis of unspecified cerebral artery: Secondary | ICD-10-CM | POA: Diagnosis not present

## 2013-01-17 DIAGNOSIS — I6789 Other cerebrovascular disease: Secondary | ICD-10-CM | POA: Diagnosis not present

## 2013-03-04 DIAGNOSIS — D473 Essential (hemorrhagic) thrombocythemia: Secondary | ICD-10-CM | POA: Diagnosis not present

## 2013-03-04 DIAGNOSIS — M79609 Pain in unspecified limb: Secondary | ICD-10-CM | POA: Diagnosis not present

## 2013-03-04 DIAGNOSIS — D649 Anemia, unspecified: Secondary | ICD-10-CM | POA: Diagnosis not present

## 2013-03-18 DIAGNOSIS — K219 Gastro-esophageal reflux disease without esophagitis: Secondary | ICD-10-CM | POA: Diagnosis not present

## 2013-03-18 DIAGNOSIS — K3184 Gastroparesis: Secondary | ICD-10-CM | POA: Diagnosis not present

## 2013-03-18 DIAGNOSIS — K5900 Constipation, unspecified: Secondary | ICD-10-CM | POA: Diagnosis not present

## 2013-03-18 DIAGNOSIS — R1310 Dysphagia, unspecified: Secondary | ICD-10-CM | POA: Diagnosis not present

## 2013-03-19 DIAGNOSIS — Z1231 Encounter for screening mammogram for malignant neoplasm of breast: Secondary | ICD-10-CM | POA: Diagnosis not present

## 2013-03-19 DIAGNOSIS — R269 Unspecified abnormalities of gait and mobility: Secondary | ICD-10-CM | POA: Diagnosis not present

## 2013-03-19 DIAGNOSIS — H81399 Other peripheral vertigo, unspecified ear: Secondary | ICD-10-CM | POA: Diagnosis not present

## 2013-03-19 DIAGNOSIS — I679 Cerebrovascular disease, unspecified: Secondary | ICD-10-CM | POA: Diagnosis not present

## 2013-03-19 DIAGNOSIS — D473 Essential (hemorrhagic) thrombocythemia: Secondary | ICD-10-CM | POA: Diagnosis not present

## 2013-03-19 DIAGNOSIS — R079 Chest pain, unspecified: Secondary | ICD-10-CM | POA: Diagnosis not present

## 2013-03-29 DIAGNOSIS — N926 Irregular menstruation, unspecified: Secondary | ICD-10-CM | POA: Diagnosis not present

## 2013-04-09 DIAGNOSIS — Z7902 Long term (current) use of antithrombotics/antiplatelets: Secondary | ICD-10-CM | POA: Diagnosis not present

## 2013-04-09 DIAGNOSIS — Z8673 Personal history of transient ischemic attack (TIA), and cerebral infarction without residual deficits: Secondary | ICD-10-CM | POA: Diagnosis not present

## 2013-04-09 DIAGNOSIS — K3184 Gastroparesis: Secondary | ICD-10-CM | POA: Diagnosis not present

## 2013-04-09 DIAGNOSIS — R1310 Dysphagia, unspecified: Secondary | ICD-10-CM | POA: Diagnosis not present

## 2013-04-09 DIAGNOSIS — E119 Type 2 diabetes mellitus without complications: Secondary | ICD-10-CM | POA: Diagnosis not present

## 2013-04-09 DIAGNOSIS — K2980 Duodenitis without bleeding: Secondary | ICD-10-CM | POA: Diagnosis not present

## 2013-04-09 DIAGNOSIS — F329 Major depressive disorder, single episode, unspecified: Secondary | ICD-10-CM | POA: Diagnosis not present

## 2013-04-09 DIAGNOSIS — K208 Other esophagitis without bleeding: Secondary | ICD-10-CM | POA: Diagnosis not present

## 2013-04-09 DIAGNOSIS — D473 Essential (hemorrhagic) thrombocythemia: Secondary | ICD-10-CM | POA: Diagnosis not present

## 2013-04-09 DIAGNOSIS — K219 Gastro-esophageal reflux disease without esophagitis: Secondary | ICD-10-CM | POA: Diagnosis not present

## 2013-04-09 DIAGNOSIS — I10 Essential (primary) hypertension: Secondary | ICD-10-CM | POA: Diagnosis not present

## 2013-04-09 DIAGNOSIS — K449 Diaphragmatic hernia without obstruction or gangrene: Secondary | ICD-10-CM | POA: Diagnosis not present

## 2013-05-07 DIAGNOSIS — J018 Other acute sinusitis: Secondary | ICD-10-CM | POA: Diagnosis not present

## 2013-05-07 DIAGNOSIS — R05 Cough: Secondary | ICD-10-CM | POA: Diagnosis not present

## 2013-05-08 DIAGNOSIS — G43011 Migraine without aura, intractable, with status migrainosus: Secondary | ICD-10-CM | POA: Diagnosis not present

## 2013-05-08 DIAGNOSIS — I6789 Other cerebrovascular disease: Secondary | ICD-10-CM | POA: Diagnosis not present

## 2013-05-08 DIAGNOSIS — I6330 Cerebral infarction due to thrombosis of unspecified cerebral artery: Secondary | ICD-10-CM | POA: Diagnosis not present

## 2013-05-08 DIAGNOSIS — R269 Unspecified abnormalities of gait and mobility: Secondary | ICD-10-CM | POA: Diagnosis not present

## 2013-05-09 DIAGNOSIS — R1310 Dysphagia, unspecified: Secondary | ICD-10-CM | POA: Diagnosis not present

## 2013-05-09 DIAGNOSIS — K3184 Gastroparesis: Secondary | ICD-10-CM | POA: Diagnosis not present

## 2013-05-09 DIAGNOSIS — K5900 Constipation, unspecified: Secondary | ICD-10-CM | POA: Diagnosis not present

## 2013-05-10 DIAGNOSIS — G43011 Migraine without aura, intractable, with status migrainosus: Secondary | ICD-10-CM | POA: Diagnosis not present

## 2013-05-10 DIAGNOSIS — I6789 Other cerebrovascular disease: Secondary | ICD-10-CM | POA: Diagnosis not present

## 2013-05-10 DIAGNOSIS — R51 Headache: Secondary | ICD-10-CM | POA: Diagnosis not present

## 2013-05-10 DIAGNOSIS — R269 Unspecified abnormalities of gait and mobility: Secondary | ICD-10-CM | POA: Diagnosis not present

## 2013-05-10 DIAGNOSIS — I6330 Cerebral infarction due to thrombosis of unspecified cerebral artery: Secondary | ICD-10-CM | POA: Diagnosis not present

## 2013-05-13 DIAGNOSIS — R0989 Other specified symptoms and signs involving the circulatory and respiratory systems: Secondary | ICD-10-CM | POA: Diagnosis not present

## 2013-05-13 DIAGNOSIS — I6789 Other cerebrovascular disease: Secondary | ICD-10-CM | POA: Diagnosis not present

## 2013-05-13 DIAGNOSIS — I6330 Cerebral infarction due to thrombosis of unspecified cerebral artery: Secondary | ICD-10-CM | POA: Diagnosis not present

## 2013-05-13 DIAGNOSIS — R269 Unspecified abnormalities of gait and mobility: Secondary | ICD-10-CM | POA: Diagnosis not present

## 2013-05-14 DIAGNOSIS — R1310 Dysphagia, unspecified: Secondary | ICD-10-CM | POA: Diagnosis not present

## 2013-05-14 DIAGNOSIS — R933 Abnormal findings on diagnostic imaging of other parts of digestive tract: Secondary | ICD-10-CM | POA: Diagnosis not present

## 2013-05-14 DIAGNOSIS — K449 Diaphragmatic hernia without obstruction or gangrene: Secondary | ICD-10-CM | POA: Diagnosis not present

## 2013-05-15 DIAGNOSIS — R599 Enlarged lymph nodes, unspecified: Secondary | ICD-10-CM | POA: Diagnosis not present

## 2013-06-03 DIAGNOSIS — D473 Essential (hemorrhagic) thrombocythemia: Secondary | ICD-10-CM | POA: Diagnosis not present

## 2013-06-03 DIAGNOSIS — D649 Anemia, unspecified: Secondary | ICD-10-CM | POA: Diagnosis not present

## 2013-06-03 DIAGNOSIS — M79609 Pain in unspecified limb: Secondary | ICD-10-CM | POA: Diagnosis not present

## 2013-07-04 DIAGNOSIS — N926 Irregular menstruation, unspecified: Secondary | ICD-10-CM | POA: Diagnosis not present

## 2013-07-15 DIAGNOSIS — R269 Unspecified abnormalities of gait and mobility: Secondary | ICD-10-CM | POA: Diagnosis not present

## 2013-07-15 DIAGNOSIS — I6789 Other cerebrovascular disease: Secondary | ICD-10-CM | POA: Diagnosis not present

## 2013-07-15 DIAGNOSIS — R0989 Other specified symptoms and signs involving the circulatory and respiratory systems: Secondary | ICD-10-CM | POA: Diagnosis not present

## 2013-07-15 DIAGNOSIS — I6330 Cerebral infarction due to thrombosis of unspecified cerebral artery: Secondary | ICD-10-CM | POA: Diagnosis not present

## 2013-07-22 DIAGNOSIS — N926 Irregular menstruation, unspecified: Secondary | ICD-10-CM | POA: Diagnosis not present

## 2013-09-02 DIAGNOSIS — M899 Disorder of bone, unspecified: Secondary | ICD-10-CM | POA: Diagnosis not present

## 2013-09-02 DIAGNOSIS — Z9119 Patient's noncompliance with other medical treatment and regimen: Secondary | ICD-10-CM | POA: Diagnosis not present

## 2013-09-02 DIAGNOSIS — N644 Mastodynia: Secondary | ICD-10-CM | POA: Diagnosis not present

## 2013-09-02 DIAGNOSIS — Z91199 Patient's noncompliance with other medical treatment and regimen due to unspecified reason: Secondary | ICD-10-CM | POA: Diagnosis not present

## 2013-09-02 DIAGNOSIS — N6019 Diffuse cystic mastopathy of unspecified breast: Secondary | ICD-10-CM | POA: Diagnosis not present

## 2013-09-02 DIAGNOSIS — D473 Essential (hemorrhagic) thrombocythemia: Secondary | ICD-10-CM | POA: Diagnosis not present

## 2013-09-02 DIAGNOSIS — R161 Splenomegaly, not elsewhere classified: Secondary | ICD-10-CM | POA: Diagnosis not present

## 2013-09-02 DIAGNOSIS — M949 Disorder of cartilage, unspecified: Secondary | ICD-10-CM | POA: Diagnosis not present

## 2013-09-02 DIAGNOSIS — R1012 Left upper quadrant pain: Secondary | ICD-10-CM | POA: Diagnosis not present

## 2013-09-09 DIAGNOSIS — N63 Unspecified lump in unspecified breast: Secondary | ICD-10-CM | POA: Diagnosis not present

## 2013-09-09 DIAGNOSIS — N6009 Solitary cyst of unspecified breast: Secondary | ICD-10-CM | POA: Diagnosis not present

## 2013-09-09 DIAGNOSIS — R922 Inconclusive mammogram: Secondary | ICD-10-CM | POA: Diagnosis not present

## 2013-09-09 DIAGNOSIS — N6089 Other benign mammary dysplasias of unspecified breast: Secondary | ICD-10-CM | POA: Diagnosis not present

## 2013-09-16 DIAGNOSIS — I10 Essential (primary) hypertension: Secondary | ICD-10-CM | POA: Diagnosis not present

## 2013-09-16 DIAGNOSIS — R51 Headache: Secondary | ICD-10-CM

## 2013-09-16 DIAGNOSIS — G43011 Migraine without aura, intractable, with status migrainosus: Secondary | ICD-10-CM | POA: Insufficient documentation

## 2013-09-16 DIAGNOSIS — R300 Dysuria: Secondary | ICD-10-CM | POA: Diagnosis not present

## 2013-09-16 DIAGNOSIS — I6789 Other cerebrovascular disease: Secondary | ICD-10-CM | POA: Insufficient documentation

## 2013-09-16 DIAGNOSIS — E119 Type 2 diabetes mellitus without complications: Secondary | ICD-10-CM | POA: Insufficient documentation

## 2013-09-16 DIAGNOSIS — I6330 Cerebral infarction due to thrombosis of unspecified cerebral artery: Secondary | ICD-10-CM | POA: Insufficient documentation

## 2013-09-16 DIAGNOSIS — R269 Unspecified abnormalities of gait and mobility: Secondary | ICD-10-CM | POA: Diagnosis not present

## 2013-09-16 DIAGNOSIS — R0989 Other specified symptoms and signs involving the circulatory and respiratory systems: Secondary | ICD-10-CM | POA: Diagnosis not present

## 2013-09-16 DIAGNOSIS — I679 Cerebrovascular disease, unspecified: Secondary | ICD-10-CM | POA: Insufficient documentation

## 2013-09-16 DIAGNOSIS — R599 Enlarged lymph nodes, unspecified: Secondary | ICD-10-CM | POA: Diagnosis not present

## 2013-09-16 DIAGNOSIS — R519 Headache, unspecified: Secondary | ICD-10-CM | POA: Insufficient documentation

## 2013-09-16 DIAGNOSIS — IMO0001 Reserved for inherently not codable concepts without codable children: Secondary | ICD-10-CM | POA: Diagnosis not present

## 2013-09-25 DIAGNOSIS — R141 Gas pain: Secondary | ICD-10-CM | POA: Diagnosis not present

## 2013-09-25 DIAGNOSIS — K219 Gastro-esophageal reflux disease without esophagitis: Secondary | ICD-10-CM | POA: Diagnosis not present

## 2013-09-25 DIAGNOSIS — R142 Eructation: Secondary | ICD-10-CM | POA: Diagnosis not present

## 2013-10-08 DIAGNOSIS — R300 Dysuria: Secondary | ICD-10-CM | POA: Diagnosis not present

## 2013-10-08 DIAGNOSIS — I10 Essential (primary) hypertension: Secondary | ICD-10-CM | POA: Diagnosis not present

## 2013-10-08 DIAGNOSIS — IMO0001 Reserved for inherently not codable concepts without codable children: Secondary | ICD-10-CM | POA: Diagnosis not present

## 2013-10-08 DIAGNOSIS — R599 Enlarged lymph nodes, unspecified: Secondary | ICD-10-CM | POA: Diagnosis not present

## 2013-10-30 DIAGNOSIS — I10 Essential (primary) hypertension: Secondary | ICD-10-CM | POA: Diagnosis not present

## 2013-10-30 DIAGNOSIS — R42 Dizziness and giddiness: Secondary | ICD-10-CM | POA: Diagnosis not present

## 2013-10-30 DIAGNOSIS — E119 Type 2 diabetes mellitus without complications: Secondary | ICD-10-CM | POA: Diagnosis not present

## 2013-11-12 DIAGNOSIS — R141 Gas pain: Secondary | ICD-10-CM | POA: Diagnosis not present

## 2013-11-12 DIAGNOSIS — R6881 Early satiety: Secondary | ICD-10-CM | POA: Diagnosis not present

## 2013-11-12 DIAGNOSIS — K219 Gastro-esophageal reflux disease without esophagitis: Secondary | ICD-10-CM | POA: Diagnosis not present

## 2013-11-29 DIAGNOSIS — M79609 Pain in unspecified limb: Secondary | ICD-10-CM | POA: Diagnosis not present

## 2013-11-29 DIAGNOSIS — D473 Essential (hemorrhagic) thrombocythemia: Secondary | ICD-10-CM | POA: Diagnosis not present

## 2013-11-29 DIAGNOSIS — D649 Anemia, unspecified: Secondary | ICD-10-CM | POA: Diagnosis not present

## 2013-12-02 DIAGNOSIS — R1310 Dysphagia, unspecified: Secondary | ICD-10-CM | POA: Diagnosis not present

## 2013-12-02 DIAGNOSIS — K219 Gastro-esophageal reflux disease without esophagitis: Secondary | ICD-10-CM | POA: Diagnosis not present

## 2013-12-02 DIAGNOSIS — R141 Gas pain: Secondary | ICD-10-CM | POA: Diagnosis not present

## 2013-12-02 DIAGNOSIS — R143 Flatulence: Secondary | ICD-10-CM | POA: Diagnosis not present

## 2013-12-03 DIAGNOSIS — R141 Gas pain: Secondary | ICD-10-CM | POA: Diagnosis not present

## 2013-12-03 DIAGNOSIS — E119 Type 2 diabetes mellitus without complications: Secondary | ICD-10-CM | POA: Diagnosis not present

## 2013-12-03 DIAGNOSIS — K219 Gastro-esophageal reflux disease without esophagitis: Secondary | ICD-10-CM | POA: Diagnosis not present

## 2013-12-03 DIAGNOSIS — A049 Bacterial intestinal infection, unspecified: Secondary | ICD-10-CM | POA: Diagnosis not present

## 2013-12-03 DIAGNOSIS — R143 Flatulence: Secondary | ICD-10-CM | POA: Diagnosis not present

## 2013-12-25 DIAGNOSIS — R141 Gas pain: Secondary | ICD-10-CM | POA: Diagnosis not present

## 2013-12-25 DIAGNOSIS — R143 Flatulence: Secondary | ICD-10-CM | POA: Diagnosis not present

## 2013-12-27 DIAGNOSIS — E785 Hyperlipidemia, unspecified: Secondary | ICD-10-CM | POA: Diagnosis not present

## 2013-12-27 DIAGNOSIS — I10 Essential (primary) hypertension: Secondary | ICD-10-CM | POA: Diagnosis not present

## 2013-12-27 DIAGNOSIS — K3184 Gastroparesis: Secondary | ICD-10-CM | POA: Diagnosis not present

## 2013-12-27 DIAGNOSIS — IMO0001 Reserved for inherently not codable concepts without codable children: Secondary | ICD-10-CM | POA: Diagnosis not present

## 2014-03-07 DIAGNOSIS — D473 Essential (hemorrhagic) thrombocythemia: Secondary | ICD-10-CM | POA: Diagnosis not present

## 2014-03-07 DIAGNOSIS — M79609 Pain in unspecified limb: Secondary | ICD-10-CM | POA: Diagnosis not present

## 2014-03-07 DIAGNOSIS — D649 Anemia, unspecified: Secondary | ICD-10-CM | POA: Diagnosis not present

## 2014-03-20 DIAGNOSIS — B3731 Acute candidiasis of vulva and vagina: Secondary | ICD-10-CM | POA: Diagnosis not present

## 2014-03-20 DIAGNOSIS — B373 Candidiasis of vulva and vagina: Secondary | ICD-10-CM | POA: Diagnosis not present

## 2014-03-25 DIAGNOSIS — D473 Essential (hemorrhagic) thrombocythemia: Secondary | ICD-10-CM | POA: Diagnosis not present

## 2014-03-26 DIAGNOSIS — IMO0002 Reserved for concepts with insufficient information to code with codable children: Secondary | ICD-10-CM | POA: Diagnosis not present

## 2014-03-26 DIAGNOSIS — D473 Essential (hemorrhagic) thrombocythemia: Secondary | ICD-10-CM | POA: Diagnosis not present

## 2014-03-26 DIAGNOSIS — R109 Unspecified abdominal pain: Secondary | ICD-10-CM | POA: Diagnosis not present

## 2014-03-26 DIAGNOSIS — S3981XA Other specified injuries of abdomen, initial encounter: Secondary | ICD-10-CM | POA: Diagnosis not present

## 2014-03-26 DIAGNOSIS — R161 Splenomegaly, not elsewhere classified: Secondary | ICD-10-CM | POA: Diagnosis not present

## 2014-03-26 DIAGNOSIS — R1012 Left upper quadrant pain: Secondary | ICD-10-CM | POA: Diagnosis not present

## 2014-03-26 DIAGNOSIS — I6789 Other cerebrovascular disease: Secondary | ICD-10-CM | POA: Diagnosis not present

## 2014-03-26 DIAGNOSIS — M533 Sacrococcygeal disorders, not elsewhere classified: Secondary | ICD-10-CM | POA: Diagnosis not present

## 2014-03-26 DIAGNOSIS — S300XXA Contusion of lower back and pelvis, initial encounter: Secondary | ICD-10-CM | POA: Diagnosis not present

## 2014-03-26 DIAGNOSIS — K869 Disease of pancreas, unspecified: Secondary | ICD-10-CM | POA: Diagnosis not present

## 2014-03-26 DIAGNOSIS — G43011 Migraine without aura, intractable, with status migrainosus: Secondary | ICD-10-CM | POA: Diagnosis not present

## 2014-03-26 DIAGNOSIS — I6330 Cerebral infarction due to thrombosis of unspecified cerebral artery: Secondary | ICD-10-CM | POA: Diagnosis not present

## 2014-04-03 DIAGNOSIS — E1169 Type 2 diabetes mellitus with other specified complication: Secondary | ICD-10-CM | POA: Diagnosis not present

## 2014-04-03 DIAGNOSIS — R51 Headache: Secondary | ICD-10-CM | POA: Diagnosis not present

## 2014-04-03 DIAGNOSIS — R269 Unspecified abnormalities of gait and mobility: Secondary | ICD-10-CM | POA: Diagnosis not present

## 2014-04-03 DIAGNOSIS — R110 Nausea: Secondary | ICD-10-CM | POA: Diagnosis not present

## 2014-04-03 DIAGNOSIS — Z87891 Personal history of nicotine dependence: Secondary | ICD-10-CM | POA: Diagnosis not present

## 2014-04-03 DIAGNOSIS — R29818 Other symptoms and signs involving the nervous system: Secondary | ICD-10-CM | POA: Diagnosis not present

## 2014-04-03 DIAGNOSIS — E1165 Type 2 diabetes mellitus with hyperglycemia: Secondary | ICD-10-CM | POA: Diagnosis not present

## 2014-04-03 DIAGNOSIS — IMO0002 Reserved for concepts with insufficient information to code with codable children: Secondary | ICD-10-CM | POA: Diagnosis not present

## 2014-04-03 DIAGNOSIS — R4789 Other speech disturbances: Secondary | ICD-10-CM | POA: Diagnosis not present

## 2014-04-03 DIAGNOSIS — Z9119 Patient's noncompliance with other medical treatment and regimen: Secondary | ICD-10-CM | POA: Diagnosis not present

## 2014-04-03 DIAGNOSIS — Z7902 Long term (current) use of antithrombotics/antiplatelets: Secondary | ICD-10-CM | POA: Diagnosis not present

## 2014-04-03 DIAGNOSIS — I6350 Cerebral infarction due to unspecified occlusion or stenosis of unspecified cerebral artery: Secondary | ICD-10-CM | POA: Diagnosis not present

## 2014-04-03 DIAGNOSIS — I7789 Other specified disorders of arteries and arterioles: Secondary | ICD-10-CM | POA: Diagnosis not present

## 2014-04-03 DIAGNOSIS — R262 Difficulty in walking, not elsewhere classified: Secondary | ICD-10-CM | POA: Diagnosis not present

## 2014-04-03 DIAGNOSIS — Z794 Long term (current) use of insulin: Secondary | ICD-10-CM | POA: Diagnosis not present

## 2014-04-03 DIAGNOSIS — I10 Essential (primary) hypertension: Secondary | ICD-10-CM | POA: Diagnosis not present

## 2014-04-03 DIAGNOSIS — Z79899 Other long term (current) drug therapy: Secondary | ICD-10-CM | POA: Diagnosis not present

## 2014-04-03 DIAGNOSIS — Z8719 Personal history of other diseases of the digestive system: Secondary | ICD-10-CM | POA: Diagnosis not present

## 2014-04-03 DIAGNOSIS — E785 Hyperlipidemia, unspecified: Secondary | ICD-10-CM | POA: Diagnosis not present

## 2014-04-03 DIAGNOSIS — Z91199 Patient's noncompliance with other medical treatment and regimen due to unspecified reason: Secondary | ICD-10-CM | POA: Diagnosis not present

## 2014-04-03 DIAGNOSIS — Z8673 Personal history of transient ischemic attack (TIA), and cerebral infarction without residual deficits: Secondary | ICD-10-CM | POA: Diagnosis not present

## 2014-04-03 DIAGNOSIS — D473 Essential (hemorrhagic) thrombocythemia: Secondary | ICD-10-CM | POA: Diagnosis not present

## 2014-04-07 DIAGNOSIS — I69954 Hemiplegia and hemiparesis following unspecified cerebrovascular disease affecting left non-dominant side: Secondary | ICD-10-CM | POA: Diagnosis not present

## 2014-04-07 DIAGNOSIS — I69959 Hemiplegia and hemiparesis following unspecified cerebrovascular disease affecting unspecified side: Secondary | ICD-10-CM | POA: Diagnosis not present

## 2014-04-07 DIAGNOSIS — I10 Essential (primary) hypertension: Secondary | ICD-10-CM | POA: Diagnosis not present

## 2014-04-07 DIAGNOSIS — Z794 Long term (current) use of insulin: Secondary | ICD-10-CM | POA: Diagnosis not present

## 2014-04-07 DIAGNOSIS — E118 Type 2 diabetes mellitus with unspecified complications: Secondary | ICD-10-CM | POA: Diagnosis not present

## 2014-04-07 DIAGNOSIS — Z5189 Encounter for other specified aftercare: Secondary | ICD-10-CM | POA: Diagnosis not present

## 2014-04-07 DIAGNOSIS — IMO0001 Reserved for inherently not codable concepts without codable children: Secondary | ICD-10-CM | POA: Diagnosis not present

## 2014-04-08 DIAGNOSIS — D473 Essential (hemorrhagic) thrombocythemia: Secondary | ICD-10-CM | POA: Diagnosis not present

## 2014-04-08 DIAGNOSIS — E119 Type 2 diabetes mellitus without complications: Secondary | ICD-10-CM | POA: Diagnosis not present

## 2014-04-08 DIAGNOSIS — I10 Essential (primary) hypertension: Secondary | ICD-10-CM | POA: Diagnosis not present

## 2014-04-08 DIAGNOSIS — I6789 Other cerebrovascular disease: Secondary | ICD-10-CM | POA: Diagnosis not present

## 2014-04-09 DIAGNOSIS — Z5189 Encounter for other specified aftercare: Secondary | ICD-10-CM | POA: Diagnosis not present

## 2014-04-09 DIAGNOSIS — I69954 Hemiplegia and hemiparesis following unspecified cerebrovascular disease affecting left non-dominant side: Secondary | ICD-10-CM | POA: Diagnosis not present

## 2014-04-09 DIAGNOSIS — I10 Essential (primary) hypertension: Secondary | ICD-10-CM | POA: Diagnosis not present

## 2014-04-09 DIAGNOSIS — E118 Type 2 diabetes mellitus with unspecified complications: Secondary | ICD-10-CM | POA: Diagnosis not present

## 2014-04-09 DIAGNOSIS — Z794 Long term (current) use of insulin: Secondary | ICD-10-CM | POA: Diagnosis not present

## 2014-04-10 DIAGNOSIS — E118 Type 2 diabetes mellitus with unspecified complications: Secondary | ICD-10-CM | POA: Diagnosis not present

## 2014-04-10 DIAGNOSIS — Z5189 Encounter for other specified aftercare: Secondary | ICD-10-CM | POA: Diagnosis not present

## 2014-04-10 DIAGNOSIS — I69954 Hemiplegia and hemiparesis following unspecified cerebrovascular disease affecting left non-dominant side: Secondary | ICD-10-CM | POA: Diagnosis not present

## 2014-04-10 DIAGNOSIS — Z794 Long term (current) use of insulin: Secondary | ICD-10-CM | POA: Diagnosis not present

## 2014-04-10 DIAGNOSIS — I10 Essential (primary) hypertension: Secondary | ICD-10-CM | POA: Diagnosis not present

## 2014-04-12 DIAGNOSIS — Z794 Long term (current) use of insulin: Secondary | ICD-10-CM | POA: Diagnosis not present

## 2014-04-12 DIAGNOSIS — Z5189 Encounter for other specified aftercare: Secondary | ICD-10-CM | POA: Diagnosis not present

## 2014-04-12 DIAGNOSIS — I10 Essential (primary) hypertension: Secondary | ICD-10-CM | POA: Diagnosis not present

## 2014-04-12 DIAGNOSIS — E118 Type 2 diabetes mellitus with unspecified complications: Secondary | ICD-10-CM | POA: Diagnosis not present

## 2014-04-12 DIAGNOSIS — I69954 Hemiplegia and hemiparesis following unspecified cerebrovascular disease affecting left non-dominant side: Secondary | ICD-10-CM | POA: Diagnosis not present

## 2014-04-14 DIAGNOSIS — I69954 Hemiplegia and hemiparesis following unspecified cerebrovascular disease affecting left non-dominant side: Secondary | ICD-10-CM | POA: Diagnosis not present

## 2014-04-14 DIAGNOSIS — E118 Type 2 diabetes mellitus with unspecified complications: Secondary | ICD-10-CM | POA: Diagnosis not present

## 2014-04-14 DIAGNOSIS — I10 Essential (primary) hypertension: Secondary | ICD-10-CM | POA: Diagnosis not present

## 2014-04-14 DIAGNOSIS — Z794 Long term (current) use of insulin: Secondary | ICD-10-CM | POA: Diagnosis not present

## 2014-04-14 DIAGNOSIS — Z5189 Encounter for other specified aftercare: Secondary | ICD-10-CM | POA: Diagnosis not present

## 2014-04-15 DIAGNOSIS — E118 Type 2 diabetes mellitus with unspecified complications: Secondary | ICD-10-CM | POA: Diagnosis not present

## 2014-04-15 DIAGNOSIS — I69954 Hemiplegia and hemiparesis following unspecified cerebrovascular disease affecting left non-dominant side: Secondary | ICD-10-CM | POA: Diagnosis not present

## 2014-04-15 DIAGNOSIS — I10 Essential (primary) hypertension: Secondary | ICD-10-CM | POA: Diagnosis not present

## 2014-04-15 DIAGNOSIS — Z794 Long term (current) use of insulin: Secondary | ICD-10-CM | POA: Diagnosis not present

## 2014-04-15 DIAGNOSIS — Z5189 Encounter for other specified aftercare: Secondary | ICD-10-CM | POA: Diagnosis not present

## 2014-04-16 DIAGNOSIS — Z5189 Encounter for other specified aftercare: Secondary | ICD-10-CM | POA: Diagnosis not present

## 2014-04-16 DIAGNOSIS — I10 Essential (primary) hypertension: Secondary | ICD-10-CM | POA: Diagnosis not present

## 2014-04-16 DIAGNOSIS — E118 Type 2 diabetes mellitus with unspecified complications: Secondary | ICD-10-CM | POA: Diagnosis not present

## 2014-04-16 DIAGNOSIS — I69954 Hemiplegia and hemiparesis following unspecified cerebrovascular disease affecting left non-dominant side: Secondary | ICD-10-CM | POA: Diagnosis not present

## 2014-04-16 DIAGNOSIS — Z794 Long term (current) use of insulin: Secondary | ICD-10-CM | POA: Diagnosis not present

## 2014-04-17 DIAGNOSIS — I10 Essential (primary) hypertension: Secondary | ICD-10-CM | POA: Diagnosis not present

## 2014-04-17 DIAGNOSIS — I69954 Hemiplegia and hemiparesis following unspecified cerebrovascular disease affecting left non-dominant side: Secondary | ICD-10-CM | POA: Diagnosis not present

## 2014-04-17 DIAGNOSIS — Z5189 Encounter for other specified aftercare: Secondary | ICD-10-CM | POA: Diagnosis not present

## 2014-04-17 DIAGNOSIS — E118 Type 2 diabetes mellitus with unspecified complications: Secondary | ICD-10-CM | POA: Diagnosis not present

## 2014-04-17 DIAGNOSIS — Z794 Long term (current) use of insulin: Secondary | ICD-10-CM | POA: Diagnosis not present

## 2014-04-18 DIAGNOSIS — I10 Essential (primary) hypertension: Secondary | ICD-10-CM | POA: Diagnosis not present

## 2014-04-18 DIAGNOSIS — Z794 Long term (current) use of insulin: Secondary | ICD-10-CM | POA: Diagnosis not present

## 2014-04-18 DIAGNOSIS — I69954 Hemiplegia and hemiparesis following unspecified cerebrovascular disease affecting left non-dominant side: Secondary | ICD-10-CM | POA: Diagnosis not present

## 2014-04-18 DIAGNOSIS — Z5189 Encounter for other specified aftercare: Secondary | ICD-10-CM | POA: Diagnosis not present

## 2014-04-18 DIAGNOSIS — E118 Type 2 diabetes mellitus with unspecified complications: Secondary | ICD-10-CM | POA: Diagnosis not present

## 2014-04-21 DIAGNOSIS — E118 Type 2 diabetes mellitus with unspecified complications: Secondary | ICD-10-CM | POA: Diagnosis not present

## 2014-04-21 DIAGNOSIS — Z794 Long term (current) use of insulin: Secondary | ICD-10-CM | POA: Diagnosis not present

## 2014-04-21 DIAGNOSIS — I69954 Hemiplegia and hemiparesis following unspecified cerebrovascular disease affecting left non-dominant side: Secondary | ICD-10-CM | POA: Diagnosis not present

## 2014-04-21 DIAGNOSIS — Z5189 Encounter for other specified aftercare: Secondary | ICD-10-CM | POA: Diagnosis not present

## 2014-04-21 DIAGNOSIS — I10 Essential (primary) hypertension: Secondary | ICD-10-CM | POA: Diagnosis not present

## 2014-04-22 DIAGNOSIS — Z5189 Encounter for other specified aftercare: Secondary | ICD-10-CM | POA: Diagnosis not present

## 2014-04-22 DIAGNOSIS — I10 Essential (primary) hypertension: Secondary | ICD-10-CM | POA: Diagnosis not present

## 2014-04-22 DIAGNOSIS — I69954 Hemiplegia and hemiparesis following unspecified cerebrovascular disease affecting left non-dominant side: Secondary | ICD-10-CM | POA: Diagnosis not present

## 2014-04-22 DIAGNOSIS — Z794 Long term (current) use of insulin: Secondary | ICD-10-CM | POA: Diagnosis not present

## 2014-04-22 DIAGNOSIS — E118 Type 2 diabetes mellitus with unspecified complications: Secondary | ICD-10-CM | POA: Diagnosis not present

## 2014-04-23 DIAGNOSIS — I10 Essential (primary) hypertension: Secondary | ICD-10-CM | POA: Diagnosis not present

## 2014-04-23 DIAGNOSIS — E118 Type 2 diabetes mellitus with unspecified complications: Secondary | ICD-10-CM | POA: Diagnosis not present

## 2014-04-23 DIAGNOSIS — Z794 Long term (current) use of insulin: Secondary | ICD-10-CM | POA: Diagnosis not present

## 2014-04-23 DIAGNOSIS — I69954 Hemiplegia and hemiparesis following unspecified cerebrovascular disease affecting left non-dominant side: Secondary | ICD-10-CM | POA: Diagnosis not present

## 2014-04-23 DIAGNOSIS — D649 Anemia, unspecified: Secondary | ICD-10-CM | POA: Diagnosis not present

## 2014-04-23 DIAGNOSIS — M79609 Pain in unspecified limb: Secondary | ICD-10-CM | POA: Diagnosis not present

## 2014-04-23 DIAGNOSIS — D473 Essential (hemorrhagic) thrombocythemia: Secondary | ICD-10-CM | POA: Diagnosis not present

## 2014-04-23 DIAGNOSIS — Z5189 Encounter for other specified aftercare: Secondary | ICD-10-CM | POA: Diagnosis not present

## 2014-04-24 DIAGNOSIS — I10 Essential (primary) hypertension: Secondary | ICD-10-CM | POA: Diagnosis not present

## 2014-04-24 DIAGNOSIS — E118 Type 2 diabetes mellitus with unspecified complications: Secondary | ICD-10-CM | POA: Diagnosis not present

## 2014-04-24 DIAGNOSIS — Z5189 Encounter for other specified aftercare: Secondary | ICD-10-CM | POA: Diagnosis not present

## 2014-04-24 DIAGNOSIS — Z794 Long term (current) use of insulin: Secondary | ICD-10-CM | POA: Diagnosis not present

## 2014-04-24 DIAGNOSIS — I69954 Hemiplegia and hemiparesis following unspecified cerebrovascular disease affecting left non-dominant side: Secondary | ICD-10-CM | POA: Diagnosis not present

## 2014-04-25 DIAGNOSIS — E118 Type 2 diabetes mellitus with unspecified complications: Secondary | ICD-10-CM | POA: Diagnosis not present

## 2014-04-25 DIAGNOSIS — Z794 Long term (current) use of insulin: Secondary | ICD-10-CM | POA: Diagnosis not present

## 2014-04-25 DIAGNOSIS — I10 Essential (primary) hypertension: Secondary | ICD-10-CM | POA: Diagnosis not present

## 2014-04-25 DIAGNOSIS — Z5189 Encounter for other specified aftercare: Secondary | ICD-10-CM | POA: Diagnosis not present

## 2014-04-25 DIAGNOSIS — I69954 Hemiplegia and hemiparesis following unspecified cerebrovascular disease affecting left non-dominant side: Secondary | ICD-10-CM | POA: Diagnosis not present

## 2014-04-28 DIAGNOSIS — E118 Type 2 diabetes mellitus with unspecified complications: Secondary | ICD-10-CM | POA: Diagnosis not present

## 2014-04-28 DIAGNOSIS — I69954 Hemiplegia and hemiparesis following unspecified cerebrovascular disease affecting left non-dominant side: Secondary | ICD-10-CM | POA: Diagnosis not present

## 2014-04-28 DIAGNOSIS — I10 Essential (primary) hypertension: Secondary | ICD-10-CM | POA: Diagnosis not present

## 2014-04-28 DIAGNOSIS — Z794 Long term (current) use of insulin: Secondary | ICD-10-CM | POA: Diagnosis not present

## 2014-04-28 DIAGNOSIS — Z5189 Encounter for other specified aftercare: Secondary | ICD-10-CM | POA: Diagnosis not present

## 2014-04-29 DIAGNOSIS — I10 Essential (primary) hypertension: Secondary | ICD-10-CM | POA: Diagnosis not present

## 2014-04-29 DIAGNOSIS — I69954 Hemiplegia and hemiparesis following unspecified cerebrovascular disease affecting left non-dominant side: Secondary | ICD-10-CM | POA: Diagnosis not present

## 2014-04-29 DIAGNOSIS — Z5189 Encounter for other specified aftercare: Secondary | ICD-10-CM | POA: Diagnosis not present

## 2014-04-29 DIAGNOSIS — Z794 Long term (current) use of insulin: Secondary | ICD-10-CM | POA: Diagnosis not present

## 2014-04-29 DIAGNOSIS — E118 Type 2 diabetes mellitus with unspecified complications: Secondary | ICD-10-CM | POA: Diagnosis not present

## 2014-04-30 DIAGNOSIS — E118 Type 2 diabetes mellitus with unspecified complications: Secondary | ICD-10-CM | POA: Diagnosis not present

## 2014-04-30 DIAGNOSIS — I69954 Hemiplegia and hemiparesis following unspecified cerebrovascular disease affecting left non-dominant side: Secondary | ICD-10-CM | POA: Diagnosis not present

## 2014-04-30 DIAGNOSIS — Z5189 Encounter for other specified aftercare: Secondary | ICD-10-CM | POA: Diagnosis not present

## 2014-04-30 DIAGNOSIS — Z794 Long term (current) use of insulin: Secondary | ICD-10-CM | POA: Diagnosis not present

## 2014-04-30 DIAGNOSIS — I10 Essential (primary) hypertension: Secondary | ICD-10-CM | POA: Diagnosis not present

## 2014-05-01 DIAGNOSIS — Z794 Long term (current) use of insulin: Secondary | ICD-10-CM | POA: Diagnosis not present

## 2014-05-01 DIAGNOSIS — I69954 Hemiplegia and hemiparesis following unspecified cerebrovascular disease affecting left non-dominant side: Secondary | ICD-10-CM | POA: Diagnosis not present

## 2014-05-01 DIAGNOSIS — E118 Type 2 diabetes mellitus with unspecified complications: Secondary | ICD-10-CM | POA: Diagnosis not present

## 2014-05-01 DIAGNOSIS — Z5189 Encounter for other specified aftercare: Secondary | ICD-10-CM | POA: Diagnosis not present

## 2014-05-01 DIAGNOSIS — I10 Essential (primary) hypertension: Secondary | ICD-10-CM | POA: Diagnosis not present

## 2014-05-02 DIAGNOSIS — Z794 Long term (current) use of insulin: Secondary | ICD-10-CM | POA: Diagnosis not present

## 2014-05-02 DIAGNOSIS — Z5189 Encounter for other specified aftercare: Secondary | ICD-10-CM | POA: Diagnosis not present

## 2014-05-02 DIAGNOSIS — I69954 Hemiplegia and hemiparesis following unspecified cerebrovascular disease affecting left non-dominant side: Secondary | ICD-10-CM | POA: Diagnosis not present

## 2014-05-02 DIAGNOSIS — I10 Essential (primary) hypertension: Secondary | ICD-10-CM | POA: Diagnosis not present

## 2014-05-02 DIAGNOSIS — E118 Type 2 diabetes mellitus with unspecified complications: Secondary | ICD-10-CM | POA: Diagnosis not present

## 2014-05-05 DIAGNOSIS — Z794 Long term (current) use of insulin: Secondary | ICD-10-CM | POA: Diagnosis not present

## 2014-05-05 DIAGNOSIS — Z5189 Encounter for other specified aftercare: Secondary | ICD-10-CM | POA: Diagnosis not present

## 2014-05-05 DIAGNOSIS — I10 Essential (primary) hypertension: Secondary | ICD-10-CM | POA: Diagnosis not present

## 2014-05-05 DIAGNOSIS — I69954 Hemiplegia and hemiparesis following unspecified cerebrovascular disease affecting left non-dominant side: Secondary | ICD-10-CM | POA: Diagnosis not present

## 2014-05-05 DIAGNOSIS — E118 Type 2 diabetes mellitus with unspecified complications: Secondary | ICD-10-CM | POA: Diagnosis not present

## 2014-05-07 DIAGNOSIS — K3184 Gastroparesis: Secondary | ICD-10-CM | POA: Diagnosis not present

## 2014-05-07 DIAGNOSIS — R1033 Periumbilical pain: Secondary | ICD-10-CM | POA: Diagnosis not present

## 2014-05-07 DIAGNOSIS — B373 Candidiasis of vulva and vagina: Secondary | ICD-10-CM | POA: Diagnosis not present

## 2014-05-07 DIAGNOSIS — K6389 Other specified diseases of intestine: Secondary | ICD-10-CM | POA: Diagnosis not present

## 2014-05-08 DIAGNOSIS — I69954 Hemiplegia and hemiparesis following unspecified cerebrovascular disease affecting left non-dominant side: Secondary | ICD-10-CM | POA: Diagnosis not present

## 2014-05-08 DIAGNOSIS — E118 Type 2 diabetes mellitus with unspecified complications: Secondary | ICD-10-CM | POA: Diagnosis not present

## 2014-05-08 DIAGNOSIS — I10 Essential (primary) hypertension: Secondary | ICD-10-CM | POA: Diagnosis not present

## 2014-05-08 DIAGNOSIS — Z5189 Encounter for other specified aftercare: Secondary | ICD-10-CM | POA: Diagnosis not present

## 2014-05-08 DIAGNOSIS — Z794 Long term (current) use of insulin: Secondary | ICD-10-CM | POA: Diagnosis not present

## 2014-05-12 DIAGNOSIS — Z5189 Encounter for other specified aftercare: Secondary | ICD-10-CM | POA: Diagnosis not present

## 2014-05-12 DIAGNOSIS — M21372 Foot drop, left foot: Secondary | ICD-10-CM | POA: Diagnosis not present

## 2014-05-12 DIAGNOSIS — D473 Essential (hemorrhagic) thrombocythemia: Secondary | ICD-10-CM | POA: Diagnosis not present

## 2014-05-12 DIAGNOSIS — Z794 Long term (current) use of insulin: Secondary | ICD-10-CM | POA: Diagnosis not present

## 2014-05-12 DIAGNOSIS — E119 Type 2 diabetes mellitus without complications: Secondary | ICD-10-CM | POA: Diagnosis not present

## 2014-05-12 DIAGNOSIS — I69954 Hemiplegia and hemiparesis following unspecified cerebrovascular disease affecting left non-dominant side: Secondary | ICD-10-CM | POA: Diagnosis not present

## 2014-05-12 DIAGNOSIS — E118 Type 2 diabetes mellitus with unspecified complications: Secondary | ICD-10-CM | POA: Diagnosis not present

## 2014-05-12 DIAGNOSIS — I10 Essential (primary) hypertension: Secondary | ICD-10-CM | POA: Diagnosis not present

## 2014-05-13 DIAGNOSIS — I69954 Hemiplegia and hemiparesis following unspecified cerebrovascular disease affecting left non-dominant side: Secondary | ICD-10-CM | POA: Diagnosis not present

## 2014-05-13 DIAGNOSIS — Z794 Long term (current) use of insulin: Secondary | ICD-10-CM | POA: Diagnosis not present

## 2014-05-13 DIAGNOSIS — Z5189 Encounter for other specified aftercare: Secondary | ICD-10-CM | POA: Diagnosis not present

## 2014-05-13 DIAGNOSIS — E118 Type 2 diabetes mellitus with unspecified complications: Secondary | ICD-10-CM | POA: Diagnosis not present

## 2014-05-13 DIAGNOSIS — I10 Essential (primary) hypertension: Secondary | ICD-10-CM | POA: Diagnosis not present

## 2014-05-14 DIAGNOSIS — I10 Essential (primary) hypertension: Secondary | ICD-10-CM | POA: Diagnosis not present

## 2014-05-14 DIAGNOSIS — I69954 Hemiplegia and hemiparesis following unspecified cerebrovascular disease affecting left non-dominant side: Secondary | ICD-10-CM | POA: Diagnosis not present

## 2014-05-14 DIAGNOSIS — E118 Type 2 diabetes mellitus with unspecified complications: Secondary | ICD-10-CM | POA: Diagnosis not present

## 2014-05-14 DIAGNOSIS — Z794 Long term (current) use of insulin: Secondary | ICD-10-CM | POA: Diagnosis not present

## 2014-05-14 DIAGNOSIS — Z5189 Encounter for other specified aftercare: Secondary | ICD-10-CM | POA: Diagnosis not present

## 2014-05-15 DIAGNOSIS — I10 Essential (primary) hypertension: Secondary | ICD-10-CM | POA: Diagnosis not present

## 2014-05-15 DIAGNOSIS — I69954 Hemiplegia and hemiparesis following unspecified cerebrovascular disease affecting left non-dominant side: Secondary | ICD-10-CM | POA: Diagnosis not present

## 2014-05-15 DIAGNOSIS — Z794 Long term (current) use of insulin: Secondary | ICD-10-CM | POA: Diagnosis not present

## 2014-05-15 DIAGNOSIS — E118 Type 2 diabetes mellitus with unspecified complications: Secondary | ICD-10-CM | POA: Diagnosis not present

## 2014-05-15 DIAGNOSIS — Z5189 Encounter for other specified aftercare: Secondary | ICD-10-CM | POA: Diagnosis not present

## 2014-05-16 DIAGNOSIS — Z794 Long term (current) use of insulin: Secondary | ICD-10-CM | POA: Diagnosis not present

## 2014-05-16 DIAGNOSIS — I10 Essential (primary) hypertension: Secondary | ICD-10-CM | POA: Diagnosis not present

## 2014-05-16 DIAGNOSIS — I69954 Hemiplegia and hemiparesis following unspecified cerebrovascular disease affecting left non-dominant side: Secondary | ICD-10-CM | POA: Diagnosis not present

## 2014-05-16 DIAGNOSIS — Z5189 Encounter for other specified aftercare: Secondary | ICD-10-CM | POA: Diagnosis not present

## 2014-05-16 DIAGNOSIS — E118 Type 2 diabetes mellitus with unspecified complications: Secondary | ICD-10-CM | POA: Diagnosis not present

## 2014-05-20 DIAGNOSIS — I10 Essential (primary) hypertension: Secondary | ICD-10-CM | POA: Diagnosis not present

## 2014-05-20 DIAGNOSIS — Z5189 Encounter for other specified aftercare: Secondary | ICD-10-CM | POA: Diagnosis not present

## 2014-05-20 DIAGNOSIS — I69954 Hemiplegia and hemiparesis following unspecified cerebrovascular disease affecting left non-dominant side: Secondary | ICD-10-CM | POA: Diagnosis not present

## 2014-05-20 DIAGNOSIS — E118 Type 2 diabetes mellitus with unspecified complications: Secondary | ICD-10-CM | POA: Diagnosis not present

## 2014-05-20 DIAGNOSIS — Z794 Long term (current) use of insulin: Secondary | ICD-10-CM | POA: Diagnosis not present

## 2014-05-30 DIAGNOSIS — I639 Cerebral infarction, unspecified: Secondary | ICD-10-CM | POA: Diagnosis not present

## 2014-05-30 DIAGNOSIS — G43011 Migraine without aura, intractable, with status migrainosus: Secondary | ICD-10-CM | POA: Diagnosis not present

## 2014-05-30 DIAGNOSIS — I6789 Other cerebrovascular disease: Secondary | ICD-10-CM | POA: Diagnosis not present

## 2014-05-30 DIAGNOSIS — I6330 Cerebral infarction due to thrombosis of unspecified cerebral artery: Secondary | ICD-10-CM | POA: Diagnosis not present

## 2014-08-19 DIAGNOSIS — D473 Essential (hemorrhagic) thrombocythemia: Secondary | ICD-10-CM | POA: Diagnosis not present

## 2014-08-21 DIAGNOSIS — M545 Low back pain: Secondary | ICD-10-CM | POA: Diagnosis not present

## 2014-08-21 DIAGNOSIS — R300 Dysuria: Secondary | ICD-10-CM | POA: Diagnosis not present

## 2014-09-05 DIAGNOSIS — I6330 Cerebral infarction due to thrombosis of unspecified cerebral artery: Secondary | ICD-10-CM | POA: Diagnosis not present

## 2014-09-05 DIAGNOSIS — G43011 Migraine without aura, intractable, with status migrainosus: Secondary | ICD-10-CM | POA: Diagnosis not present

## 2014-09-12 DIAGNOSIS — Z1231 Encounter for screening mammogram for malignant neoplasm of breast: Secondary | ICD-10-CM | POA: Diagnosis not present

## 2014-09-12 DIAGNOSIS — R928 Other abnormal and inconclusive findings on diagnostic imaging of breast: Secondary | ICD-10-CM | POA: Diagnosis not present

## 2014-09-26 DIAGNOSIS — N63 Unspecified lump in breast: Secondary | ICD-10-CM | POA: Diagnosis not present

## 2014-09-26 DIAGNOSIS — R928 Other abnormal and inconclusive findings on diagnostic imaging of breast: Secondary | ICD-10-CM | POA: Diagnosis not present

## 2014-10-29 DIAGNOSIS — J029 Acute pharyngitis, unspecified: Secondary | ICD-10-CM | POA: Diagnosis not present

## 2014-10-29 DIAGNOSIS — R0981 Nasal congestion: Secondary | ICD-10-CM | POA: Diagnosis not present

## 2014-10-29 DIAGNOSIS — R067 Sneezing: Secondary | ICD-10-CM | POA: Diagnosis not present

## 2014-10-29 DIAGNOSIS — J309 Allergic rhinitis, unspecified: Secondary | ICD-10-CM | POA: Diagnosis not present

## 2014-11-11 DIAGNOSIS — D473 Essential (hemorrhagic) thrombocythemia: Secondary | ICD-10-CM | POA: Diagnosis not present

## 2014-11-12 DIAGNOSIS — K6389 Other specified diseases of intestine: Secondary | ICD-10-CM | POA: Diagnosis not present

## 2014-11-12 DIAGNOSIS — E1143 Type 2 diabetes mellitus with diabetic autonomic (poly)neuropathy: Secondary | ICD-10-CM | POA: Diagnosis not present

## 2014-11-12 DIAGNOSIS — K3184 Gastroparesis: Secondary | ICD-10-CM | POA: Diagnosis not present

## 2014-11-12 DIAGNOSIS — R143 Flatulence: Secondary | ICD-10-CM | POA: Diagnosis not present

## 2014-11-12 DIAGNOSIS — R140 Abdominal distension (gaseous): Secondary | ICD-10-CM | POA: Diagnosis not present

## 2014-11-18 DIAGNOSIS — H35379 Puckering of macula, unspecified eye: Secondary | ICD-10-CM | POA: Diagnosis not present

## 2014-11-18 DIAGNOSIS — E10311 Type 1 diabetes mellitus with unspecified diabetic retinopathy with macular edema: Secondary | ICD-10-CM | POA: Diagnosis not present

## 2014-11-18 DIAGNOSIS — H5213 Myopia, bilateral: Secondary | ICD-10-CM | POA: Diagnosis not present

## 2014-12-09 DIAGNOSIS — E1165 Type 2 diabetes mellitus with hyperglycemia: Secondary | ICD-10-CM | POA: Diagnosis not present

## 2015-02-24 DIAGNOSIS — E1165 Type 2 diabetes mellitus with hyperglycemia: Secondary | ICD-10-CM | POA: Diagnosis not present

## 2015-02-24 DIAGNOSIS — Z8673 Personal history of transient ischemic attack (TIA), and cerebral infarction without residual deficits: Secondary | ICD-10-CM | POA: Diagnosis not present

## 2015-02-24 DIAGNOSIS — Z794 Long term (current) use of insulin: Secondary | ICD-10-CM | POA: Diagnosis not present

## 2015-02-24 DIAGNOSIS — Z9181 History of falling: Secondary | ICD-10-CM | POA: Diagnosis not present

## 2015-02-24 DIAGNOSIS — F329 Major depressive disorder, single episode, unspecified: Secondary | ICD-10-CM | POA: Diagnosis not present

## 2015-02-24 DIAGNOSIS — I10 Essential (primary) hypertension: Secondary | ICD-10-CM | POA: Diagnosis not present

## 2015-03-03 DIAGNOSIS — D473 Essential (hemorrhagic) thrombocythemia: Secondary | ICD-10-CM | POA: Diagnosis not present

## 2015-03-10 DIAGNOSIS — G43011 Migraine without aura, intractable, with status migrainosus: Secondary | ICD-10-CM | POA: Diagnosis not present

## 2015-03-10 DIAGNOSIS — I6330 Cerebral infarction due to thrombosis of unspecified cerebral artery: Secondary | ICD-10-CM | POA: Diagnosis not present

## 2015-03-10 DIAGNOSIS — I6789 Other cerebrovascular disease: Secondary | ICD-10-CM | POA: Diagnosis not present

## 2015-03-11 DIAGNOSIS — I10 Essential (primary) hypertension: Secondary | ICD-10-CM | POA: Diagnosis not present

## 2015-03-11 DIAGNOSIS — E1165 Type 2 diabetes mellitus with hyperglycemia: Secondary | ICD-10-CM | POA: Diagnosis not present

## 2015-03-11 DIAGNOSIS — N63 Unspecified lump in breast: Secondary | ICD-10-CM | POA: Diagnosis not present

## 2015-03-17 DIAGNOSIS — K6389 Other specified diseases of intestine: Secondary | ICD-10-CM | POA: Diagnosis not present

## 2015-03-17 DIAGNOSIS — K3184 Gastroparesis: Secondary | ICD-10-CM | POA: Diagnosis not present

## 2015-03-17 DIAGNOSIS — R1314 Dysphagia, pharyngoesophageal phase: Secondary | ICD-10-CM | POA: Diagnosis not present

## 2015-03-30 DIAGNOSIS — R922 Inconclusive mammogram: Secondary | ICD-10-CM | POA: Diagnosis not present

## 2015-03-30 DIAGNOSIS — R928 Other abnormal and inconclusive findings on diagnostic imaging of breast: Secondary | ICD-10-CM | POA: Diagnosis not present

## 2015-03-30 DIAGNOSIS — N6489 Other specified disorders of breast: Secondary | ICD-10-CM | POA: Diagnosis not present

## 2015-04-16 DIAGNOSIS — E119 Type 2 diabetes mellitus without complications: Secondary | ICD-10-CM | POA: Diagnosis not present

## 2015-04-16 DIAGNOSIS — Z8673 Personal history of transient ischemic attack (TIA), and cerebral infarction without residual deficits: Secondary | ICD-10-CM | POA: Diagnosis not present

## 2015-04-16 DIAGNOSIS — R1314 Dysphagia, pharyngoesophageal phase: Secondary | ICD-10-CM | POA: Diagnosis not present

## 2015-04-16 DIAGNOSIS — L538 Other specified erythematous conditions: Secondary | ICD-10-CM | POA: Diagnosis not present

## 2015-04-16 DIAGNOSIS — R1310 Dysphagia, unspecified: Secondary | ICD-10-CM | POA: Diagnosis not present

## 2015-04-16 DIAGNOSIS — D473 Essential (hemorrhagic) thrombocythemia: Secondary | ICD-10-CM | POA: Diagnosis not present

## 2015-04-16 DIAGNOSIS — K200 Eosinophilic esophagitis: Secondary | ICD-10-CM | POA: Diagnosis not present

## 2015-04-16 DIAGNOSIS — Z79899 Other long term (current) drug therapy: Secondary | ICD-10-CM | POA: Diagnosis not present

## 2015-04-30 DIAGNOSIS — M25532 Pain in left wrist: Secondary | ICD-10-CM | POA: Diagnosis not present

## 2015-05-27 DIAGNOSIS — Z9114 Patient's other noncompliance with medication regimen: Secondary | ICD-10-CM | POA: Diagnosis not present

## 2015-05-27 DIAGNOSIS — D473 Essential (hemorrhagic) thrombocythemia: Secondary | ICD-10-CM | POA: Diagnosis not present

## 2015-06-01 DIAGNOSIS — E1165 Type 2 diabetes mellitus with hyperglycemia: Secondary | ICD-10-CM | POA: Diagnosis not present

## 2015-06-01 DIAGNOSIS — I10 Essential (primary) hypertension: Secondary | ICD-10-CM | POA: Diagnosis not present

## 2015-06-04 DIAGNOSIS — K862 Cyst of pancreas: Secondary | ICD-10-CM | POA: Diagnosis not present

## 2015-06-04 DIAGNOSIS — R938 Abnormal findings on diagnostic imaging of other specified body structures: Secondary | ICD-10-CM | POA: Diagnosis not present

## 2015-06-24 DIAGNOSIS — D473 Essential (hemorrhagic) thrombocythemia: Secondary | ICD-10-CM | POA: Diagnosis not present

## 2015-06-30 DIAGNOSIS — D473 Essential (hemorrhagic) thrombocythemia: Secondary | ICD-10-CM | POA: Diagnosis not present

## 2015-07-02 DIAGNOSIS — B373 Candidiasis of vulva and vagina: Secondary | ICD-10-CM | POA: Diagnosis not present

## 2015-07-02 DIAGNOSIS — E119 Type 2 diabetes mellitus without complications: Secondary | ICD-10-CM | POA: Diagnosis not present

## 2015-07-02 DIAGNOSIS — N390 Urinary tract infection, site not specified: Secondary | ICD-10-CM | POA: Diagnosis not present

## 2015-07-02 DIAGNOSIS — I10 Essential (primary) hypertension: Secondary | ICD-10-CM | POA: Diagnosis not present

## 2015-07-21 DIAGNOSIS — D473 Essential (hemorrhagic) thrombocythemia: Secondary | ICD-10-CM | POA: Diagnosis not present

## 2015-08-19 DIAGNOSIS — Z8042 Family history of malignant neoplasm of prostate: Secondary | ICD-10-CM | POA: Diagnosis not present

## 2015-08-19 DIAGNOSIS — D473 Essential (hemorrhagic) thrombocythemia: Secondary | ICD-10-CM | POA: Diagnosis not present

## 2015-08-19 DIAGNOSIS — Z6823 Body mass index (BMI) 23.0-23.9, adult: Secondary | ICD-10-CM | POA: Diagnosis not present

## 2015-08-19 DIAGNOSIS — R109 Unspecified abdominal pain: Secondary | ICD-10-CM | POA: Diagnosis not present

## 2015-08-19 DIAGNOSIS — I252 Old myocardial infarction: Secondary | ICD-10-CM | POA: Diagnosis not present

## 2015-08-19 DIAGNOSIS — Z8673 Personal history of transient ischemic attack (TIA), and cerebral infarction without residual deficits: Secondary | ICD-10-CM | POA: Diagnosis not present

## 2015-08-19 DIAGNOSIS — Z794 Long term (current) use of insulin: Secondary | ICD-10-CM | POA: Diagnosis not present

## 2015-08-19 DIAGNOSIS — E119 Type 2 diabetes mellitus without complications: Secondary | ICD-10-CM | POA: Diagnosis not present

## 2015-08-19 DIAGNOSIS — Z7984 Long term (current) use of oral hypoglycemic drugs: Secondary | ICD-10-CM | POA: Diagnosis not present

## 2015-08-19 DIAGNOSIS — Z9049 Acquired absence of other specified parts of digestive tract: Secondary | ICD-10-CM | POA: Diagnosis not present

## 2015-08-19 DIAGNOSIS — Z7982 Long term (current) use of aspirin: Secondary | ICD-10-CM | POA: Diagnosis not present

## 2015-08-19 DIAGNOSIS — Z862 Personal history of diseases of the blood and blood-forming organs and certain disorders involving the immune mechanism: Secondary | ICD-10-CM | POA: Diagnosis not present

## 2015-08-19 DIAGNOSIS — Z791 Long term (current) use of non-steroidal anti-inflammatories (NSAID): Secondary | ICD-10-CM | POA: Diagnosis not present

## 2015-08-21 DIAGNOSIS — Z9114 Patient's other noncompliance with medication regimen: Secondary | ICD-10-CM | POA: Diagnosis not present

## 2015-08-21 DIAGNOSIS — R51 Headache: Secondary | ICD-10-CM | POA: Diagnosis not present

## 2015-08-21 DIAGNOSIS — R739 Hyperglycemia, unspecified: Secondary | ICD-10-CM | POA: Diagnosis not present

## 2015-08-21 DIAGNOSIS — D473 Essential (hemorrhagic) thrombocythemia: Secondary | ICD-10-CM | POA: Diagnosis not present

## 2015-08-21 DIAGNOSIS — R1012 Left upper quadrant pain: Secondary | ICD-10-CM | POA: Diagnosis not present

## 2015-08-21 DIAGNOSIS — I10 Essential (primary) hypertension: Secondary | ICD-10-CM | POA: Diagnosis not present

## 2015-08-21 DIAGNOSIS — Z79891 Long term (current) use of opiate analgesic: Secondary | ICD-10-CM | POA: Diagnosis not present

## 2015-09-08 DIAGNOSIS — G43011 Migraine without aura, intractable, with status migrainosus: Secondary | ICD-10-CM | POA: Diagnosis not present

## 2015-09-08 DIAGNOSIS — I6330 Cerebral infarction due to thrombosis of unspecified cerebral artery: Secondary | ICD-10-CM | POA: Diagnosis not present

## 2015-09-18 DIAGNOSIS — R161 Splenomegaly, not elsewhere classified: Secondary | ICD-10-CM | POA: Diagnosis not present

## 2015-09-18 DIAGNOSIS — R1012 Left upper quadrant pain: Secondary | ICD-10-CM | POA: Diagnosis not present

## 2015-09-18 DIAGNOSIS — D473 Essential (hemorrhagic) thrombocythemia: Secondary | ICD-10-CM | POA: Diagnosis not present

## 2015-09-18 DIAGNOSIS — G8929 Other chronic pain: Secondary | ICD-10-CM | POA: Diagnosis not present

## 2015-09-29 DIAGNOSIS — H8149 Vertigo of central origin, unspecified ear: Secondary | ICD-10-CM | POA: Diagnosis not present

## 2015-09-29 DIAGNOSIS — I6330 Cerebral infarction due to thrombosis of unspecified cerebral artery: Secondary | ICD-10-CM | POA: Diagnosis not present

## 2015-09-29 DIAGNOSIS — G43011 Migraine without aura, intractable, with status migrainosus: Secondary | ICD-10-CM | POA: Diagnosis not present

## 2015-09-30 DIAGNOSIS — R928 Other abnormal and inconclusive findings on diagnostic imaging of breast: Secondary | ICD-10-CM | POA: Diagnosis not present

## 2015-10-01 DIAGNOSIS — I10 Essential (primary) hypertension: Secondary | ICD-10-CM | POA: Diagnosis not present

## 2015-10-01 DIAGNOSIS — F411 Generalized anxiety disorder: Secondary | ICD-10-CM | POA: Diagnosis not present

## 2015-10-07 DIAGNOSIS — I6330 Cerebral infarction due to thrombosis of unspecified cerebral artery: Secondary | ICD-10-CM | POA: Diagnosis not present

## 2015-10-07 DIAGNOSIS — I639 Cerebral infarction, unspecified: Secondary | ICD-10-CM | POA: Diagnosis not present

## 2015-10-07 DIAGNOSIS — H8149 Vertigo of central origin, unspecified ear: Secondary | ICD-10-CM | POA: Diagnosis not present

## 2015-10-07 DIAGNOSIS — R9082 White matter disease, unspecified: Secondary | ICD-10-CM | POA: Diagnosis not present

## 2015-10-19 DIAGNOSIS — I639 Cerebral infarction, unspecified: Secondary | ICD-10-CM | POA: Diagnosis not present

## 2015-10-19 DIAGNOSIS — R202 Paresthesia of skin: Secondary | ICD-10-CM | POA: Diagnosis not present

## 2015-10-19 DIAGNOSIS — H8149 Vertigo of central origin, unspecified ear: Secondary | ICD-10-CM | POA: Diagnosis not present

## 2015-10-19 DIAGNOSIS — G43011 Migraine without aura, intractable, with status migrainosus: Secondary | ICD-10-CM | POA: Diagnosis not present

## 2015-11-10 DIAGNOSIS — D473 Essential (hemorrhagic) thrombocythemia: Secondary | ICD-10-CM | POA: Diagnosis not present

## 2016-01-06 DIAGNOSIS — I10 Essential (primary) hypertension: Secondary | ICD-10-CM | POA: Diagnosis not present

## 2016-01-06 DIAGNOSIS — D473 Essential (hemorrhagic) thrombocythemia: Secondary | ICD-10-CM | POA: Diagnosis not present

## 2016-01-06 DIAGNOSIS — K219 Gastro-esophageal reflux disease without esophagitis: Secondary | ICD-10-CM | POA: Diagnosis not present

## 2016-01-06 DIAGNOSIS — E1165 Type 2 diabetes mellitus with hyperglycemia: Secondary | ICD-10-CM | POA: Diagnosis not present

## 2016-01-06 DIAGNOSIS — E7800 Pure hypercholesterolemia, unspecified: Secondary | ICD-10-CM | POA: Diagnosis not present

## 2016-01-06 DIAGNOSIS — G894 Chronic pain syndrome: Secondary | ICD-10-CM | POA: Diagnosis not present

## 2016-01-06 DIAGNOSIS — E11319 Type 2 diabetes mellitus with unspecified diabetic retinopathy without macular edema: Secondary | ICD-10-CM | POA: Diagnosis not present

## 2016-01-06 DIAGNOSIS — I69354 Hemiplegia and hemiparesis following cerebral infarction affecting left non-dominant side: Secondary | ICD-10-CM | POA: Diagnosis not present

## 2016-01-06 DIAGNOSIS — R161 Splenomegaly, not elsewhere classified: Secondary | ICD-10-CM | POA: Diagnosis not present

## 2016-01-06 DIAGNOSIS — G43109 Migraine with aura, not intractable, without status migrainosus: Secondary | ICD-10-CM | POA: Diagnosis not present

## 2016-01-08 DIAGNOSIS — D473 Essential (hemorrhagic) thrombocythemia: Secondary | ICD-10-CM

## 2016-01-08 DIAGNOSIS — Z8673 Personal history of transient ischemic attack (TIA), and cerebral infarction without residual deficits: Secondary | ICD-10-CM

## 2016-03-15 DIAGNOSIS — K5900 Constipation, unspecified: Secondary | ICD-10-CM | POA: Diagnosis not present

## 2016-03-15 DIAGNOSIS — K3184 Gastroparesis: Secondary | ICD-10-CM | POA: Diagnosis not present

## 2016-03-15 DIAGNOSIS — Z1211 Encounter for screening for malignant neoplasm of colon: Secondary | ICD-10-CM | POA: Diagnosis not present

## 2016-04-11 DIAGNOSIS — Z79899 Other long term (current) drug therapy: Secondary | ICD-10-CM | POA: Diagnosis not present

## 2016-04-11 DIAGNOSIS — Z7982 Long term (current) use of aspirin: Secondary | ICD-10-CM | POA: Diagnosis not present

## 2016-04-11 DIAGNOSIS — K648 Other hemorrhoids: Secondary | ICD-10-CM | POA: Diagnosis not present

## 2016-04-11 DIAGNOSIS — K3184 Gastroparesis: Secondary | ICD-10-CM | POA: Diagnosis not present

## 2016-04-11 DIAGNOSIS — E1140 Type 2 diabetes mellitus with diabetic neuropathy, unspecified: Secondary | ICD-10-CM | POA: Diagnosis not present

## 2016-04-11 DIAGNOSIS — I10 Essential (primary) hypertension: Secondary | ICD-10-CM | POA: Diagnosis not present

## 2016-04-11 DIAGNOSIS — I69354 Hemiplegia and hemiparesis following cerebral infarction affecting left non-dominant side: Secondary | ICD-10-CM | POA: Diagnosis not present

## 2016-04-11 DIAGNOSIS — K635 Polyp of colon: Secondary | ICD-10-CM | POA: Diagnosis not present

## 2016-04-11 DIAGNOSIS — K649 Unspecified hemorrhoids: Secondary | ICD-10-CM | POA: Diagnosis not present

## 2016-04-11 DIAGNOSIS — E785 Hyperlipidemia, unspecified: Secondary | ICD-10-CM | POA: Diagnosis not present

## 2016-04-11 DIAGNOSIS — Z1211 Encounter for screening for malignant neoplasm of colon: Secondary | ICD-10-CM | POA: Diagnosis not present

## 2016-04-11 DIAGNOSIS — G894 Chronic pain syndrome: Secondary | ICD-10-CM | POA: Diagnosis not present

## 2016-04-11 DIAGNOSIS — Z79891 Long term (current) use of opiate analgesic: Secondary | ICD-10-CM | POA: Diagnosis not present

## 2016-04-11 DIAGNOSIS — F419 Anxiety disorder, unspecified: Secondary | ICD-10-CM | POA: Diagnosis not present

## 2016-04-11 DIAGNOSIS — D473 Essential (hemorrhagic) thrombocythemia: Secondary | ICD-10-CM | POA: Diagnosis not present

## 2016-04-11 DIAGNOSIS — D125 Benign neoplasm of sigmoid colon: Secondary | ICD-10-CM | POA: Diagnosis not present

## 2016-04-11 DIAGNOSIS — Z794 Long term (current) use of insulin: Secondary | ICD-10-CM | POA: Diagnosis not present

## 2016-04-11 DIAGNOSIS — D490 Neoplasm of unspecified behavior of digestive system: Secondary | ICD-10-CM | POA: Diagnosis not present

## 2016-04-12 DIAGNOSIS — D473 Essential (hemorrhagic) thrombocythemia: Secondary | ICD-10-CM | POA: Diagnosis not present

## 2016-04-12 DIAGNOSIS — Z9114 Patient's other noncompliance with medication regimen: Secondary | ICD-10-CM | POA: Diagnosis not present

## 2016-04-13 DIAGNOSIS — D126 Benign neoplasm of colon, unspecified: Secondary | ICD-10-CM | POA: Insufficient documentation

## 2016-04-21 DIAGNOSIS — K8689 Other specified diseases of pancreas: Secondary | ICD-10-CM | POA: Diagnosis not present

## 2016-04-21 DIAGNOSIS — R935 Abnormal findings on diagnostic imaging of other abdominal regions, including retroperitoneum: Secondary | ICD-10-CM | POA: Diagnosis not present

## 2016-04-21 DIAGNOSIS — K862 Cyst of pancreas: Secondary | ICD-10-CM | POA: Diagnosis not present

## 2016-05-02 DIAGNOSIS — I10 Essential (primary) hypertension: Secondary | ICD-10-CM | POA: Diagnosis not present

## 2016-05-02 DIAGNOSIS — D473 Essential (hemorrhagic) thrombocythemia: Secondary | ICD-10-CM | POA: Diagnosis not present

## 2016-05-02 DIAGNOSIS — F419 Anxiety disorder, unspecified: Secondary | ICD-10-CM | POA: Diagnosis not present

## 2016-05-02 DIAGNOSIS — E1165 Type 2 diabetes mellitus with hyperglycemia: Secondary | ICD-10-CM | POA: Diagnosis not present

## 2016-05-20 DIAGNOSIS — R531 Weakness: Secondary | ICD-10-CM | POA: Diagnosis not present

## 2016-05-20 DIAGNOSIS — D473 Essential (hemorrhagic) thrombocythemia: Secondary | ICD-10-CM | POA: Diagnosis not present

## 2016-05-24 DIAGNOSIS — D473 Essential (hemorrhagic) thrombocythemia: Secondary | ICD-10-CM | POA: Diagnosis not present

## 2016-05-24 DIAGNOSIS — Z8673 Personal history of transient ischemic attack (TIA), and cerebral infarction without residual deficits: Secondary | ICD-10-CM | POA: Diagnosis not present

## 2016-05-24 DIAGNOSIS — R531 Weakness: Secondary | ICD-10-CM | POA: Diagnosis not present

## 2016-05-24 DIAGNOSIS — R51 Headache: Secondary | ICD-10-CM | POA: Diagnosis not present

## 2016-06-29 DIAGNOSIS — R21 Rash and other nonspecific skin eruption: Secondary | ICD-10-CM | POA: Diagnosis not present

## 2016-06-29 DIAGNOSIS — E119 Type 2 diabetes mellitus without complications: Secondary | ICD-10-CM | POA: Diagnosis not present

## 2016-06-29 DIAGNOSIS — F411 Generalized anxiety disorder: Secondary | ICD-10-CM | POA: Diagnosis not present

## 2016-07-06 DIAGNOSIS — D473 Essential (hemorrhagic) thrombocythemia: Secondary | ICD-10-CM | POA: Diagnosis not present

## 2016-08-08 DIAGNOSIS — N390 Urinary tract infection, site not specified: Secondary | ICD-10-CM | POA: Diagnosis not present

## 2016-08-08 DIAGNOSIS — Z5181 Encounter for therapeutic drug level monitoring: Secondary | ICD-10-CM | POA: Diagnosis not present

## 2016-08-08 DIAGNOSIS — E119 Type 2 diabetes mellitus without complications: Secondary | ICD-10-CM | POA: Diagnosis not present

## 2016-08-08 DIAGNOSIS — I10 Essential (primary) hypertension: Secondary | ICD-10-CM | POA: Diagnosis not present

## 2016-08-08 DIAGNOSIS — Z79899 Other long term (current) drug therapy: Secondary | ICD-10-CM | POA: Diagnosis not present

## 2016-10-03 DIAGNOSIS — I10 Essential (primary) hypertension: Secondary | ICD-10-CM | POA: Diagnosis not present

## 2016-10-03 DIAGNOSIS — E1165 Type 2 diabetes mellitus with hyperglycemia: Secondary | ICD-10-CM | POA: Diagnosis not present

## 2016-10-03 DIAGNOSIS — F419 Anxiety disorder, unspecified: Secondary | ICD-10-CM | POA: Diagnosis not present

## 2016-10-04 DIAGNOSIS — D473 Essential (hemorrhagic) thrombocythemia: Secondary | ICD-10-CM | POA: Diagnosis not present

## 2016-10-14 DIAGNOSIS — Z1231 Encounter for screening mammogram for malignant neoplasm of breast: Secondary | ICD-10-CM | POA: Diagnosis not present

## 2016-10-27 DIAGNOSIS — R1310 Dysphagia, unspecified: Secondary | ICD-10-CM | POA: Diagnosis not present

## 2016-11-01 DIAGNOSIS — R1310 Dysphagia, unspecified: Secondary | ICD-10-CM | POA: Diagnosis not present

## 2016-11-18 DIAGNOSIS — N390 Urinary tract infection, site not specified: Secondary | ICD-10-CM | POA: Diagnosis not present

## 2016-11-28 DIAGNOSIS — Z8673 Personal history of transient ischemic attack (TIA), and cerebral infarction without residual deficits: Secondary | ICD-10-CM | POA: Diagnosis not present

## 2016-11-28 DIAGNOSIS — K222 Esophageal obstruction: Secondary | ICD-10-CM | POA: Diagnosis not present

## 2016-11-28 DIAGNOSIS — K2970 Gastritis, unspecified, without bleeding: Secondary | ICD-10-CM | POA: Diagnosis not present

## 2016-11-28 DIAGNOSIS — K2900 Acute gastritis without bleeding: Secondary | ICD-10-CM | POA: Diagnosis not present

## 2016-11-28 DIAGNOSIS — K449 Diaphragmatic hernia without obstruction or gangrene: Secondary | ICD-10-CM | POA: Diagnosis not present

## 2016-11-28 DIAGNOSIS — Q393 Congenital stenosis and stricture of esophagus: Secondary | ICD-10-CM | POA: Diagnosis not present

## 2016-11-28 DIAGNOSIS — R1310 Dysphagia, unspecified: Secondary | ICD-10-CM | POA: Diagnosis not present

## 2016-11-28 DIAGNOSIS — R161 Splenomegaly, not elsewhere classified: Secondary | ICD-10-CM | POA: Diagnosis not present

## 2016-11-28 DIAGNOSIS — I10 Essential (primary) hypertension: Secondary | ICD-10-CM | POA: Diagnosis not present

## 2016-11-28 DIAGNOSIS — Z800 Family history of malignant neoplasm of digestive organs: Secondary | ICD-10-CM | POA: Diagnosis not present

## 2016-11-28 DIAGNOSIS — K208 Other esophagitis: Secondary | ICD-10-CM | POA: Diagnosis not present

## 2016-11-28 DIAGNOSIS — E782 Mixed hyperlipidemia: Secondary | ICD-10-CM | POA: Diagnosis not present

## 2016-11-28 DIAGNOSIS — E119 Type 2 diabetes mellitus without complications: Secondary | ICD-10-CM | POA: Diagnosis not present

## 2016-11-30 DIAGNOSIS — E1165 Type 2 diabetes mellitus with hyperglycemia: Secondary | ICD-10-CM | POA: Diagnosis not present

## 2016-11-30 DIAGNOSIS — M79671 Pain in right foot: Secondary | ICD-10-CM | POA: Diagnosis not present

## 2016-11-30 DIAGNOSIS — F411 Generalized anxiety disorder: Secondary | ICD-10-CM | POA: Diagnosis not present

## 2016-11-30 DIAGNOSIS — S92351A Displaced fracture of fifth metatarsal bone, right foot, initial encounter for closed fracture: Secondary | ICD-10-CM | POA: Diagnosis not present

## 2016-12-02 DIAGNOSIS — S92351A Displaced fracture of fifth metatarsal bone, right foot, initial encounter for closed fracture: Secondary | ICD-10-CM | POA: Diagnosis not present

## 2016-12-27 ENCOUNTER — Encounter: Payer: Self-pay | Admitting: Allergy

## 2016-12-27 ENCOUNTER — Ambulatory Visit (INDEPENDENT_AMBULATORY_CARE_PROVIDER_SITE_OTHER): Payer: Medicare Other | Admitting: Allergy

## 2016-12-27 VITALS — BP 144/88 | HR 80 | Temp 97.6°F | Resp 18 | Ht 64.45 in | Wt 141.2 lb

## 2016-12-27 DIAGNOSIS — K200 Eosinophilic esophagitis: Secondary | ICD-10-CM

## 2016-12-27 DIAGNOSIS — J301 Allergic rhinitis due to pollen: Secondary | ICD-10-CM

## 2016-12-27 NOTE — Patient Instructions (Addendum)
Eosinophilic esophagitis (EoE)    -  Your recent EGD showed evidence of increased eosinophils and your esophagus. This was also detected on previous EGDs. Elevated eosinophil levels can cause you to have symptoms of food impaction and discomfort. Sometimes this can be triggered by a food allergy and this food allergy testing was performed today which was negative for foods in your diet including beef, chocolate, nuts and also negative for dairy, soy, wheat, corn, egg, fish/shellfish    -  Continue your follow-up with GI and recommendations-- pantoprazole daily    -  You may benefit from being on your pantoprazole plus swallowed corticosteroid (Flovent or budesonide)-- discuss this with your GI   Seasonal allergies    - Environmental allergy testing was also performed today and was positive for grasses, trees, weeds, dust mite and cat.  Allergen avoidance measures discussed.     - recommend you take your Montelukast daily to help control your allergy symptoms and it may also help with your EoE   Follow-up 6-9 months or sooner if needed

## 2016-12-27 NOTE — Progress Notes (Signed)
New Patient Note  RE: Allison Stevens MRN: [id number] DOB: 1966/02/27 Date of Office Visit: 12/27/2016  Referring provider: Jackquline Denmark, MD Primary care provider: Nona Dell, Corene Cornea, MD  Chief Complaint: food getting stuck  History of present illness: Allison Stevens is a 52 y.o. female presenting today for consultation for EoE.  She currently is following with Dr. Lyndel Safe in GI for EoE.  She reports she was advised to have an allergy evaluation.  She has a history of EoE and had a recent EGD showing greater than 50 eosinophils per high-power field.  She feels her food gets stuck in her throat both solid and liquids and this has been going on  for years.   She does report she has to "vomit" to bring food back up once it is stuck.  She also reports having a lot of belching.  She was restarted on pantoprazole 40mg  daily about a month ago by Dr. Lyndel Safe.  She did try swallowed flovent for about 2 months but her GI stopped this as was not working.  She has had several esophageal dilations also.  She does not have any specific food triggers.  She reports her diet is very limited and consist of chicken, Manwich sandwiches, chips, reese's cups, eggs (she may eat twice a year).    She has montelukast that she uses as needed "when pollen gets really bad".  She reports sneezing only.   She has tried OTC allergy medications that she reports does not work.    She has no history of asthma or eczema.  No history of food allergy.    Review of systems: Review of Systems  Constitutional: Positive for weight loss. Negative for chills, fever and malaise/fatigue.  HENT: Negative for congestion, ear discharge, ear pain, nosebleeds, sinus pain, sore throat and tinnitus.   Eyes: Negative for discharge and redness.  Respiratory: Negative for cough, shortness of breath and wheezing.   Cardiovascular: Negative for chest pain.  Gastrointestinal: Positive for heartburn, nausea and vomiting. Negative for abdominal pain,  constipation and diarrhea.  Musculoskeletal: Negative for myalgias.  Skin: Negative for itching and rash.  Neurological: Negative for headaches.    All other systems negative unless noted above in HPI  Past medical history: Past Medical History:  Diagnosis Date  . Acid reflux   . Diabetes ([place]) 2002  . Gait abnormality   . Hypertension   . Migraine   . Stroke ([place])   . Thrombocytosis ([place])     Past surgical history: Past Surgical History:  Procedure Laterality Date  . CESAREAN SECTION  1997  . GALLBLADDER SURGERY  2011    Family history:  Family History  Problem Relation Age of Onset  . Diabetes Mother   . Cancer Mother   . Cancer Father     Social history: She lives in a home with carpeting with electric heating and central cooling. There are dogs and cats in the home. There is no concern for water damage, mildew or roaches in the home. She is not employed.   Medication List: Allergies as of 12/27/2016      Reactions   Iodinated Diagnostic Agents Other (See Comments)      Medication List       Accurate as of 12/27/16 10:01 AM. Always use your most recent med list.          ALPRAZolam 0.5 MG tablet Commonly known as:  XANAX Take 0.5 mg by mouth at bedtime as needed for  anxiety.   amLODipine 10 MG tablet Commonly known as:  NORVASC Take 10 mg by mouth daily.   escitalopram 10 MG tablet Commonly known as:  LEXAPRO Take 10 mg by mouth daily.   hydroxyurea 500 MG capsule Commonly known as:  HYDREA Take 500 mg by mouth 4 (four) times daily. May take with food to minimize GI side effects.   ibuprofen 800 MG tablet Commonly known as:  ADVIL,MOTRIN Take 800 mg by mouth every 8 (eight) hours as needed.   insulin aspart 100 UNIT/ML injection Commonly known as:  novoLOG Inject 34 Units into the skin 3 (three) times daily before meals.   metFORMIN 1000 MG tablet Commonly known as:  GLUCOPHAGE Take 1,000 mg by mouth 2 (two) times daily with a meal.     montelukast 10 MG tablet Commonly known as:  SINGULAIR Take 10 mg by mouth at bedtime.   niacin 500 MG tablet Take 500 mg by mouth at bedtime.   oxyCODONE 40 mg 12 hr tablet Commonly known as:  OXYCONTIN Take 40 mg by mouth every 12 (twelve) hours.   oxycodone 5 MG capsule Commonly known as:  OXY-IR Take 10 mg by mouth every 4 (four) hours as needed.   pantoprazole 40 MG tablet Commonly known as:  PROTONIX Take 40 mg by mouth daily.   pravastatin 40 MG tablet Commonly known as:  PRAVACHOL Take 40 mg by mouth daily.   prochlorperazine 10 MG tablet Commonly known as:  COMPAZINE Take 10 mg by mouth every 6 (six) hours as needed for nausea or vomiting.   TOUJEO MAX SOLOSTAR 300 UNIT/ML Sopn Generic drug:  Insulin Glargine Inject 72 Units into the skin daily.       Known medication allergies: Allergies  Allergen Reactions  . Iodinated Diagnostic Agents Other (See Comments)     Physical examination: Blood pressure (!) 144/88, pulse 80, temperature 97.6 F (36.4 C), temperature source Tympanic, resp. rate 18, height 5' 4.45" (1.637 m), weight 141 lb 3.2 oz (64 kg).  General: Alert, interactive, in no acute distress. HEENT: PERRLA, TMs pearly gray, turbinates minimally edematous without discharge, post-pharynx non erythematous. Neck: Supple without lymphadenopathy. Lungs: Clear to auscultation without wheezing, rhonchi or rales. {no increased work of breathing. CV: Normal S1, S2 without murmurs. Abdomen: Nondistended, nontender. Skin: Warm and dry, without lesions or rashes. Extremities: Walking boot on her right leg,  No clubbing, cyanosis or edema. Neuro:   Grossly intact.  Diagnositics/Labs: Esophageal biopsy from 11/28/2016: Benign squamous mucosa with esophagitis and greater than 50 eosinophils identify per high-power field. No dysplasia or malignancy identified. PAS stain was performed which was negative for fungal organisms.  ESOPHAGUS, BIOPSIES from  95/62/1308: Eosinophilic esophagitis. Negative for dysplasia and malignancy. No fungal organisms are identified on GMS special stain with satisfactory control.    Allergy testing: Food allergy testing negative for dairy, wheat, soy, corn, peanut and tree nuts, fish and shellfish, egg, beef, chocolate Environmental allergy testing positive for grasses, trees, weeds, dust mite and cat Allergy testing results were read and interpreted by provider, documented by clinical staff.   Assessment and plan:   Eosinophilic esophagitis (EoE)    -  Your recent EGD showed evidence of increased eosinophils and your esophagus. This was also detected on previous EGDs. Elevated eosinophil levels can cause you to have symptoms of food impaction and discomfort. Sometimes this can be triggered by a food allergy and this food allergy testing was performed today which was negative for foods in your  diet including beef, chocolate, nuts and also negative for dairy, soy, wheat, corn, egg, fish/shellfish    -  Continue your follow-up with GI and recommendations-- pantoprazole daily    -  You may benefit from being on your pantoprazole plus swallowed corticosteroid (Flovent or budesonide)-- discuss this with your GI    - Other treatment options that have been studied have not been shown to provide any efficacy including several biologic agents )anti-IL5) as well as antihistamine or cromolyn use.    Seasonal allergies    - Environmental allergy testing was also performed today and was positive for grasses, trees, weeds, dust mite and cat.  Allergen avoidance measures discussed.     - recommend you take your Montelukast daily to help control your allergy symptoms and it may also help with your EoE   Follow-up 6-9 months or sooner if needed  I appreciate the opportunity to take part in Willisha's care. Please do not hesitate to contact me with questions.  Sincerely,   Prudy Feeler, MD Allergy/Immunology [facility]

## 2016-12-30 DIAGNOSIS — S92351A Displaced fracture of fifth metatarsal bone, right foot, initial encounter for closed fracture: Secondary | ICD-10-CM | POA: Diagnosis not present

## 2017-01-04 DIAGNOSIS — D473 Essential (hemorrhagic) thrombocythemia: Secondary | ICD-10-CM | POA: Diagnosis not present

## 2017-01-13 DIAGNOSIS — I10 Essential (primary) hypertension: Secondary | ICD-10-CM | POA: Diagnosis not present

## 2017-01-13 DIAGNOSIS — Z5181 Encounter for therapeutic drug level monitoring: Secondary | ICD-10-CM | POA: Diagnosis not present

## 2017-01-13 DIAGNOSIS — Z79899 Other long term (current) drug therapy: Secondary | ICD-10-CM | POA: Diagnosis not present

## 2017-01-13 DIAGNOSIS — S92351A Displaced fracture of fifth metatarsal bone, right foot, initial encounter for closed fracture: Secondary | ICD-10-CM | POA: Diagnosis not present

## 2017-01-13 DIAGNOSIS — E1165 Type 2 diabetes mellitus with hyperglycemia: Secondary | ICD-10-CM | POA: Diagnosis not present

## 2017-01-13 DIAGNOSIS — F411 Generalized anxiety disorder: Secondary | ICD-10-CM | POA: Diagnosis not present

## 2017-01-16 DIAGNOSIS — M85871 Other specified disorders of bone density and structure, right ankle and foot: Secondary | ICD-10-CM | POA: Diagnosis not present

## 2017-01-16 DIAGNOSIS — M8588 Other specified disorders of bone density and structure, other site: Secondary | ICD-10-CM | POA: Diagnosis not present

## 2017-02-20 DIAGNOSIS — S92351A Displaced fracture of fifth metatarsal bone, right foot, initial encounter for closed fracture: Secondary | ICD-10-CM | POA: Diagnosis not present

## 2017-03-02 DIAGNOSIS — E1165 Type 2 diabetes mellitus with hyperglycemia: Secondary | ICD-10-CM | POA: Diagnosis not present

## 2017-03-02 DIAGNOSIS — W19XXXA Unspecified fall, initial encounter: Secondary | ICD-10-CM | POA: Diagnosis not present

## 2017-03-02 DIAGNOSIS — F411 Generalized anxiety disorder: Secondary | ICD-10-CM | POA: Diagnosis not present

## 2017-03-02 DIAGNOSIS — R0602 Shortness of breath: Secondary | ICD-10-CM | POA: Diagnosis not present

## 2017-03-07 DIAGNOSIS — S92515A Nondisplaced fracture of proximal phalanx of left lesser toe(s), initial encounter for closed fracture: Secondary | ICD-10-CM | POA: Diagnosis not present

## 2017-03-07 DIAGNOSIS — S92351A Displaced fracture of fifth metatarsal bone, right foot, initial encounter for closed fracture: Secondary | ICD-10-CM | POA: Diagnosis not present

## 2017-04-04 DIAGNOSIS — S92351A Displaced fracture of fifth metatarsal bone, right foot, initial encounter for closed fracture: Secondary | ICD-10-CM | POA: Diagnosis not present

## 2017-04-04 DIAGNOSIS — I10 Essential (primary) hypertension: Secondary | ICD-10-CM | POA: Diagnosis not present

## 2017-04-04 DIAGNOSIS — Z515 Encounter for palliative care: Secondary | ICD-10-CM | POA: Diagnosis not present

## 2017-04-04 DIAGNOSIS — R161 Splenomegaly, not elsewhere classified: Secondary | ICD-10-CM | POA: Diagnosis not present

## 2017-04-04 DIAGNOSIS — E119 Type 2 diabetes mellitus without complications: Secondary | ICD-10-CM | POA: Diagnosis not present

## 2017-04-04 DIAGNOSIS — Z8673 Personal history of transient ischemic attack (TIA), and cerebral infarction without residual deficits: Secondary | ICD-10-CM | POA: Diagnosis not present

## 2017-04-04 DIAGNOSIS — D473 Essential (hemorrhagic) thrombocythemia: Secondary | ICD-10-CM | POA: Diagnosis not present

## 2017-04-04 DIAGNOSIS — R1012 Left upper quadrant pain: Secondary | ICD-10-CM | POA: Diagnosis not present

## 2017-04-04 DIAGNOSIS — M8580 Other specified disorders of bone density and structure, unspecified site: Secondary | ICD-10-CM | POA: Diagnosis not present

## 2017-04-04 DIAGNOSIS — S92515A Nondisplaced fracture of proximal phalanx of left lesser toe(s), initial encounter for closed fracture: Secondary | ICD-10-CM | POA: Diagnosis not present

## 2017-05-02 DIAGNOSIS — S92351A Displaced fracture of fifth metatarsal bone, right foot, initial encounter for closed fracture: Secondary | ICD-10-CM | POA: Diagnosis not present

## 2017-05-02 DIAGNOSIS — S92515A Nondisplaced fracture of proximal phalanx of left lesser toe(s), initial encounter for closed fracture: Secondary | ICD-10-CM | POA: Diagnosis not present

## 2017-05-04 DIAGNOSIS — E1165 Type 2 diabetes mellitus with hyperglycemia: Secondary | ICD-10-CM | POA: Diagnosis not present

## 2017-05-04 DIAGNOSIS — E11319 Type 2 diabetes mellitus with unspecified diabetic retinopathy without macular edema: Secondary | ICD-10-CM | POA: Diagnosis not present

## 2017-05-04 DIAGNOSIS — Z5181 Encounter for therapeutic drug level monitoring: Secondary | ICD-10-CM | POA: Diagnosis not present

## 2017-05-04 DIAGNOSIS — Z79899 Other long term (current) drug therapy: Secondary | ICD-10-CM | POA: Diagnosis not present

## 2017-05-04 DIAGNOSIS — F419 Anxiety disorder, unspecified: Secondary | ICD-10-CM | POA: Diagnosis not present

## 2017-05-04 DIAGNOSIS — Z794 Long term (current) use of insulin: Secondary | ICD-10-CM | POA: Diagnosis not present

## 2017-06-27 ENCOUNTER — Ambulatory Visit: Payer: Medicare Other | Admitting: Allergy

## 2017-07-03 DIAGNOSIS — Z8673 Personal history of transient ischemic attack (TIA), and cerebral infarction without residual deficits: Secondary | ICD-10-CM | POA: Diagnosis not present

## 2017-07-03 DIAGNOSIS — D473 Essential (hemorrhagic) thrombocythemia: Secondary | ICD-10-CM | POA: Diagnosis not present

## 2017-07-03 DIAGNOSIS — R1012 Left upper quadrant pain: Secondary | ICD-10-CM | POA: Diagnosis not present

## 2017-07-31 DIAGNOSIS — E11319 Type 2 diabetes mellitus with unspecified diabetic retinopathy without macular edema: Secondary | ICD-10-CM | POA: Diagnosis not present

## 2017-07-31 DIAGNOSIS — N390 Urinary tract infection, site not specified: Secondary | ICD-10-CM | POA: Diagnosis not present

## 2017-07-31 DIAGNOSIS — D473 Essential (hemorrhagic) thrombocythemia: Secondary | ICD-10-CM | POA: Diagnosis not present

## 2017-07-31 DIAGNOSIS — F419 Anxiety disorder, unspecified: Secondary | ICD-10-CM | POA: Diagnosis not present

## 2017-07-31 DIAGNOSIS — Z124 Encounter for screening for malignant neoplasm of cervix: Secondary | ICD-10-CM | POA: Diagnosis not present

## 2017-07-31 DIAGNOSIS — E1165 Type 2 diabetes mellitus with hyperglycemia: Secondary | ICD-10-CM | POA: Diagnosis not present

## 2017-07-31 DIAGNOSIS — E785 Hyperlipidemia, unspecified: Secondary | ICD-10-CM | POA: Diagnosis not present

## 2017-07-31 DIAGNOSIS — R300 Dysuria: Secondary | ICD-10-CM | POA: Diagnosis not present

## 2017-07-31 DIAGNOSIS — Z6823 Body mass index (BMI) 23.0-23.9, adult: Secondary | ICD-10-CM | POA: Diagnosis not present

## 2017-07-31 DIAGNOSIS — I10 Essential (primary) hypertension: Secondary | ICD-10-CM | POA: Diagnosis not present

## 2017-07-31 DIAGNOSIS — Z1389 Encounter for screening for other disorder: Secondary | ICD-10-CM | POA: Diagnosis not present

## 2017-07-31 DIAGNOSIS — Z5181 Encounter for therapeutic drug level monitoring: Secondary | ICD-10-CM | POA: Diagnosis not present

## 2017-07-31 DIAGNOSIS — Z79899 Other long term (current) drug therapy: Secondary | ICD-10-CM | POA: Diagnosis not present

## 2017-08-10 DIAGNOSIS — E1165 Type 2 diabetes mellitus with hyperglycemia: Secondary | ICD-10-CM | POA: Diagnosis not present

## 2017-08-29 DIAGNOSIS — E1165 Type 2 diabetes mellitus with hyperglycemia: Secondary | ICD-10-CM | POA: Diagnosis not present

## 2017-08-29 DIAGNOSIS — Z794 Long term (current) use of insulin: Secondary | ICD-10-CM | POA: Diagnosis not present

## 2017-08-29 DIAGNOSIS — I10 Essential (primary) hypertension: Secondary | ICD-10-CM | POA: Diagnosis not present

## 2017-08-29 DIAGNOSIS — Z5181 Encounter for therapeutic drug level monitoring: Secondary | ICD-10-CM | POA: Diagnosis not present

## 2017-08-29 DIAGNOSIS — E11319 Type 2 diabetes mellitus with unspecified diabetic retinopathy without macular edema: Secondary | ICD-10-CM | POA: Diagnosis not present

## 2017-08-29 DIAGNOSIS — F411 Generalized anxiety disorder: Secondary | ICD-10-CM | POA: Diagnosis not present

## 2017-08-29 DIAGNOSIS — Z79899 Other long term (current) drug therapy: Secondary | ICD-10-CM | POA: Diagnosis not present

## 2017-09-26 DIAGNOSIS — F411 Generalized anxiety disorder: Secondary | ICD-10-CM | POA: Diagnosis not present

## 2017-09-26 DIAGNOSIS — E11319 Type 2 diabetes mellitus with unspecified diabetic retinopathy without macular edema: Secondary | ICD-10-CM | POA: Diagnosis not present

## 2017-09-26 DIAGNOSIS — Z794 Long term (current) use of insulin: Secondary | ICD-10-CM | POA: Diagnosis not present

## 2017-09-26 DIAGNOSIS — I10 Essential (primary) hypertension: Secondary | ICD-10-CM | POA: Diagnosis not present

## 2017-09-26 DIAGNOSIS — E1165 Type 2 diabetes mellitus with hyperglycemia: Secondary | ICD-10-CM | POA: Diagnosis not present

## 2017-09-29 DIAGNOSIS — D649 Anemia, unspecified: Secondary | ICD-10-CM | POA: Diagnosis not present

## 2017-09-29 DIAGNOSIS — K769 Liver disease, unspecified: Secondary | ICD-10-CM | POA: Diagnosis not present

## 2017-09-29 DIAGNOSIS — D473 Essential (hemorrhagic) thrombocythemia: Secondary | ICD-10-CM | POA: Diagnosis not present

## 2017-10-09 DIAGNOSIS — K869 Disease of pancreas, unspecified: Secondary | ICD-10-CM | POA: Diagnosis not present

## 2017-10-09 DIAGNOSIS — K8689 Other specified diseases of pancreas: Secondary | ICD-10-CM | POA: Diagnosis not present

## 2017-10-09 DIAGNOSIS — R935 Abnormal findings on diagnostic imaging of other abdominal regions, including retroperitoneum: Secondary | ICD-10-CM | POA: Diagnosis not present

## 2017-10-24 DIAGNOSIS — Z5181 Encounter for therapeutic drug level monitoring: Secondary | ICD-10-CM | POA: Diagnosis not present

## 2017-10-24 DIAGNOSIS — F419 Anxiety disorder, unspecified: Secondary | ICD-10-CM | POA: Diagnosis not present

## 2017-10-24 DIAGNOSIS — Z79899 Other long term (current) drug therapy: Secondary | ICD-10-CM | POA: Diagnosis not present

## 2017-10-25 ENCOUNTER — Telehealth: Payer: Self-pay

## 2017-10-25 NOTE — Telephone Encounter (Signed)
Patient wants a refill on her Pantoprazole how many refills would you like to give her?

## 2017-10-26 ENCOUNTER — Other Ambulatory Visit: Payer: Self-pay

## 2017-10-26 MED ORDER — PANTOPRAZOLE SODIUM 40 MG PO TBEC: 40.0000 mg | DELAYED_RELEASE_TABLET | Freq: Every day | ORAL | 6 refills | Status: DC

## 2017-10-26 NOTE — Telephone Encounter (Signed)
Can send her 6 refills

## 2017-10-26 NOTE — Telephone Encounter (Signed)
Medication was sent to her Pharmacy.

## 2017-11-08 DIAGNOSIS — R05 Cough: Secondary | ICD-10-CM | POA: Diagnosis not present

## 2017-11-08 DIAGNOSIS — J40 Bronchitis, not specified as acute or chronic: Secondary | ICD-10-CM | POA: Diagnosis not present

## 2017-11-23 DIAGNOSIS — Z5181 Encounter for therapeutic drug level monitoring: Secondary | ICD-10-CM | POA: Diagnosis not present

## 2017-11-23 DIAGNOSIS — E11319 Type 2 diabetes mellitus with unspecified diabetic retinopathy without macular edema: Secondary | ICD-10-CM | POA: Diagnosis not present

## 2017-11-23 DIAGNOSIS — F419 Anxiety disorder, unspecified: Secondary | ICD-10-CM | POA: Diagnosis not present

## 2017-11-23 DIAGNOSIS — Z79899 Other long term (current) drug therapy: Secondary | ICD-10-CM | POA: Diagnosis not present

## 2017-11-23 DIAGNOSIS — E1165 Type 2 diabetes mellitus with hyperglycemia: Secondary | ICD-10-CM | POA: Diagnosis not present

## 2017-12-20 DIAGNOSIS — E11319 Type 2 diabetes mellitus with unspecified diabetic retinopathy without macular edema: Secondary | ICD-10-CM | POA: Diagnosis not present

## 2017-12-20 DIAGNOSIS — Z79899 Other long term (current) drug therapy: Secondary | ICD-10-CM | POA: Diagnosis not present

## 2017-12-20 DIAGNOSIS — Z5181 Encounter for therapeutic drug level monitoring: Secondary | ICD-10-CM | POA: Diagnosis not present

## 2017-12-20 DIAGNOSIS — F411 Generalized anxiety disorder: Secondary | ICD-10-CM | POA: Diagnosis not present

## 2017-12-20 DIAGNOSIS — E1165 Type 2 diabetes mellitus with hyperglycemia: Secondary | ICD-10-CM | POA: Diagnosis not present

## 2017-12-20 DIAGNOSIS — M25562 Pain in left knee: Secondary | ICD-10-CM | POA: Diagnosis not present

## 2017-12-28 DIAGNOSIS — M25561 Pain in right knee: Secondary | ICD-10-CM | POA: Diagnosis not present

## 2018-01-05 DIAGNOSIS — M1711 Unilateral primary osteoarthritis, right knee: Secondary | ICD-10-CM | POA: Diagnosis not present

## 2018-01-18 DIAGNOSIS — Z79899 Other long term (current) drug therapy: Secondary | ICD-10-CM | POA: Diagnosis not present

## 2018-01-18 DIAGNOSIS — F411 Generalized anxiety disorder: Secondary | ICD-10-CM | POA: Diagnosis not present

## 2018-01-18 DIAGNOSIS — Z5181 Encounter for therapeutic drug level monitoring: Secondary | ICD-10-CM | POA: Diagnosis not present

## 2018-01-18 DIAGNOSIS — I10 Essential (primary) hypertension: Secondary | ICD-10-CM | POA: Diagnosis not present

## 2018-01-19 DIAGNOSIS — K862 Cyst of pancreas: Secondary | ICD-10-CM | POA: Diagnosis not present

## 2018-01-19 DIAGNOSIS — D473 Essential (hemorrhagic) thrombocythemia: Secondary | ICD-10-CM | POA: Diagnosis not present

## 2018-01-19 DIAGNOSIS — Z800 Family history of malignant neoplasm of digestive organs: Secondary | ICD-10-CM | POA: Diagnosis not present

## 2018-01-19 DIAGNOSIS — G894 Chronic pain syndrome: Secondary | ICD-10-CM | POA: Diagnosis not present

## 2018-01-24 ENCOUNTER — Encounter: Payer: Self-pay | Admitting: Allergy

## 2018-02-15 DIAGNOSIS — I10 Essential (primary) hypertension: Secondary | ICD-10-CM | POA: Diagnosis not present

## 2018-02-15 DIAGNOSIS — F411 Generalized anxiety disorder: Secondary | ICD-10-CM | POA: Diagnosis not present

## 2018-02-15 DIAGNOSIS — Z5181 Encounter for therapeutic drug level monitoring: Secondary | ICD-10-CM | POA: Diagnosis not present

## 2018-02-15 DIAGNOSIS — Z79899 Other long term (current) drug therapy: Secondary | ICD-10-CM | POA: Diagnosis not present

## 2018-02-22 DIAGNOSIS — R05 Cough: Secondary | ICD-10-CM | POA: Diagnosis not present

## 2018-02-22 DIAGNOSIS — R0989 Other specified symptoms and signs involving the circulatory and respiratory systems: Secondary | ICD-10-CM | POA: Diagnosis not present

## 2018-02-22 DIAGNOSIS — R29898 Other symptoms and signs involving the musculoskeletal system: Secondary | ICD-10-CM | POA: Diagnosis not present

## 2018-03-06 DIAGNOSIS — D6859 Other primary thrombophilia: Secondary | ICD-10-CM | POA: Diagnosis not present

## 2018-03-06 DIAGNOSIS — I6789 Other cerebrovascular disease: Secondary | ICD-10-CM | POA: Diagnosis not present

## 2018-03-06 DIAGNOSIS — E1165 Type 2 diabetes mellitus with hyperglycemia: Secondary | ICD-10-CM | POA: Diagnosis not present

## 2018-03-06 DIAGNOSIS — I639 Cerebral infarction, unspecified: Secondary | ICD-10-CM | POA: Diagnosis not present

## 2018-03-15 DIAGNOSIS — F411 Generalized anxiety disorder: Secondary | ICD-10-CM | POA: Diagnosis not present

## 2018-03-15 DIAGNOSIS — D6859 Other primary thrombophilia: Secondary | ICD-10-CM | POA: Diagnosis not present

## 2018-03-15 DIAGNOSIS — R296 Repeated falls: Secondary | ICD-10-CM | POA: Diagnosis not present

## 2018-03-15 DIAGNOSIS — Z794 Long term (current) use of insulin: Secondary | ICD-10-CM | POA: Diagnosis not present

## 2018-03-15 DIAGNOSIS — I639 Cerebral infarction, unspecified: Secondary | ICD-10-CM | POA: Diagnosis not present

## 2018-03-15 DIAGNOSIS — E1165 Type 2 diabetes mellitus with hyperglycemia: Secondary | ICD-10-CM | POA: Diagnosis not present

## 2018-03-15 DIAGNOSIS — Z79899 Other long term (current) drug therapy: Secondary | ICD-10-CM | POA: Diagnosis not present

## 2018-03-15 DIAGNOSIS — Z5181 Encounter for therapeutic drug level monitoring: Secondary | ICD-10-CM | POA: Diagnosis not present

## 2018-03-15 DIAGNOSIS — H532 Diplopia: Secondary | ICD-10-CM | POA: Diagnosis not present

## 2018-04-09 DIAGNOSIS — D473 Essential (hemorrhagic) thrombocythemia: Secondary | ICD-10-CM | POA: Diagnosis not present

## 2018-04-09 DIAGNOSIS — M1711 Unilateral primary osteoarthritis, right knee: Secondary | ICD-10-CM | POA: Diagnosis not present

## 2018-04-09 DIAGNOSIS — R935 Abnormal findings on diagnostic imaging of other abdominal regions, including retroperitoneum: Secondary | ICD-10-CM | POA: Diagnosis not present

## 2018-04-09 DIAGNOSIS — K869 Disease of pancreas, unspecified: Secondary | ICD-10-CM | POA: Diagnosis not present

## 2018-04-16 DIAGNOSIS — R531 Weakness: Secondary | ICD-10-CM | POA: Diagnosis not present

## 2018-04-16 DIAGNOSIS — D649 Anemia, unspecified: Secondary | ICD-10-CM

## 2018-04-16 DIAGNOSIS — E1165 Type 2 diabetes mellitus with hyperglycemia: Secondary | ICD-10-CM | POA: Diagnosis not present

## 2018-04-16 DIAGNOSIS — G894 Chronic pain syndrome: Secondary | ICD-10-CM | POA: Diagnosis not present

## 2018-04-16 DIAGNOSIS — K862 Cyst of pancreas: Secondary | ICD-10-CM | POA: Diagnosis not present

## 2018-04-16 DIAGNOSIS — D473 Essential (hemorrhagic) thrombocythemia: Secondary | ICD-10-CM | POA: Diagnosis not present

## 2018-04-16 DIAGNOSIS — F411 Generalized anxiety disorder: Secondary | ICD-10-CM | POA: Diagnosis not present

## 2018-04-16 DIAGNOSIS — Z808 Family history of malignant neoplasm of other organs or systems: Secondary | ICD-10-CM | POA: Diagnosis not present

## 2018-04-16 DIAGNOSIS — Z79899 Other long term (current) drug therapy: Secondary | ICD-10-CM | POA: Diagnosis not present

## 2018-04-16 DIAGNOSIS — Z794 Long term (current) use of insulin: Secondary | ICD-10-CM | POA: Diagnosis not present

## 2018-04-16 DIAGNOSIS — Z800 Family history of malignant neoplasm of digestive organs: Secondary | ICD-10-CM

## 2018-04-16 DIAGNOSIS — Z5181 Encounter for therapeutic drug level monitoring: Secondary | ICD-10-CM | POA: Diagnosis not present

## 2018-05-17 DIAGNOSIS — R531 Weakness: Secondary | ICD-10-CM | POA: Diagnosis not present

## 2018-05-17 DIAGNOSIS — F411 Generalized anxiety disorder: Secondary | ICD-10-CM | POA: Diagnosis not present

## 2018-05-17 DIAGNOSIS — D649 Anemia, unspecified: Secondary | ICD-10-CM | POA: Diagnosis not present

## 2018-05-17 DIAGNOSIS — Z79899 Other long term (current) drug therapy: Secondary | ICD-10-CM | POA: Diagnosis not present

## 2018-05-17 DIAGNOSIS — E538 Deficiency of other specified B group vitamins: Secondary | ICD-10-CM | POA: Diagnosis not present

## 2018-05-17 DIAGNOSIS — R5383 Other fatigue: Secondary | ICD-10-CM | POA: Diagnosis not present

## 2018-05-17 DIAGNOSIS — Z5181 Encounter for therapeutic drug level monitoring: Secondary | ICD-10-CM | POA: Diagnosis not present

## 2018-06-14 DIAGNOSIS — G894 Chronic pain syndrome: Secondary | ICD-10-CM | POA: Diagnosis not present

## 2018-06-14 DIAGNOSIS — Z808 Family history of malignant neoplasm of other organs or systems: Secondary | ICD-10-CM | POA: Diagnosis not present

## 2018-06-14 DIAGNOSIS — Z79899 Other long term (current) drug therapy: Secondary | ICD-10-CM | POA: Diagnosis not present

## 2018-06-14 DIAGNOSIS — Z5181 Encounter for therapeutic drug level monitoring: Secondary | ICD-10-CM | POA: Diagnosis not present

## 2018-06-14 DIAGNOSIS — D649 Anemia, unspecified: Secondary | ICD-10-CM | POA: Diagnosis not present

## 2018-06-14 DIAGNOSIS — D473 Essential (hemorrhagic) thrombocythemia: Secondary | ICD-10-CM | POA: Diagnosis not present

## 2018-06-14 DIAGNOSIS — I10 Essential (primary) hypertension: Secondary | ICD-10-CM | POA: Diagnosis not present

## 2018-06-14 DIAGNOSIS — Z794 Long term (current) use of insulin: Secondary | ICD-10-CM | POA: Diagnosis not present

## 2018-06-14 DIAGNOSIS — K8689 Other specified diseases of pancreas: Secondary | ICD-10-CM | POA: Diagnosis not present

## 2018-06-14 DIAGNOSIS — F419 Anxiety disorder, unspecified: Secondary | ICD-10-CM | POA: Diagnosis not present

## 2018-06-14 DIAGNOSIS — E1165 Type 2 diabetes mellitus with hyperglycemia: Secondary | ICD-10-CM | POA: Diagnosis not present

## 2018-07-10 DIAGNOSIS — I10 Essential (primary) hypertension: Secondary | ICD-10-CM | POA: Diagnosis not present

## 2018-07-10 DIAGNOSIS — F411 Generalized anxiety disorder: Secondary | ICD-10-CM | POA: Diagnosis not present

## 2018-07-10 DIAGNOSIS — E1165 Type 2 diabetes mellitus with hyperglycemia: Secondary | ICD-10-CM | POA: Diagnosis not present

## 2018-07-10 DIAGNOSIS — Z5181 Encounter for therapeutic drug level monitoring: Secondary | ICD-10-CM | POA: Diagnosis not present

## 2018-07-10 DIAGNOSIS — Z79899 Other long term (current) drug therapy: Secondary | ICD-10-CM | POA: Diagnosis not present

## 2018-08-09 DIAGNOSIS — K862 Cyst of pancreas: Secondary | ICD-10-CM | POA: Diagnosis not present

## 2018-08-09 DIAGNOSIS — Z79891 Long term (current) use of opiate analgesic: Secondary | ICD-10-CM | POA: Diagnosis not present

## 2018-08-09 DIAGNOSIS — D473 Essential (hemorrhagic) thrombocythemia: Secondary | ICD-10-CM | POA: Diagnosis not present

## 2018-08-09 DIAGNOSIS — Z79899 Other long term (current) drug therapy: Secondary | ICD-10-CM | POA: Diagnosis not present

## 2018-08-09 DIAGNOSIS — Z8673 Personal history of transient ischemic attack (TIA), and cerebral infarction without residual deficits: Secondary | ICD-10-CM | POA: Diagnosis not present

## 2018-08-09 DIAGNOSIS — E119 Type 2 diabetes mellitus without complications: Secondary | ICD-10-CM | POA: Diagnosis not present

## 2018-08-09 DIAGNOSIS — I10 Essential (primary) hypertension: Secondary | ICD-10-CM | POA: Diagnosis not present

## 2018-08-10 DIAGNOSIS — F411 Generalized anxiety disorder: Secondary | ICD-10-CM | POA: Diagnosis not present

## 2018-08-10 DIAGNOSIS — R339 Retention of urine, unspecified: Secondary | ICD-10-CM | POA: Diagnosis not present

## 2018-08-10 DIAGNOSIS — Z5181 Encounter for therapeutic drug level monitoring: Secondary | ICD-10-CM | POA: Diagnosis not present

## 2018-08-10 DIAGNOSIS — Z79899 Other long term (current) drug therapy: Secondary | ICD-10-CM | POA: Diagnosis not present

## 2018-08-10 DIAGNOSIS — I10 Essential (primary) hypertension: Secondary | ICD-10-CM | POA: Diagnosis not present

## 2018-10-03 DIAGNOSIS — Z5181 Encounter for therapeutic drug level monitoring: Secondary | ICD-10-CM | POA: Diagnosis not present

## 2018-10-03 DIAGNOSIS — D3192 Benign neoplasm of unspecified part of left eye: Secondary | ICD-10-CM | POA: Diagnosis not present

## 2018-10-03 DIAGNOSIS — D2230 Melanocytic nevi of unspecified part of face: Secondary | ICD-10-CM | POA: Diagnosis not present

## 2018-10-03 DIAGNOSIS — F411 Generalized anxiety disorder: Secondary | ICD-10-CM | POA: Diagnosis not present

## 2018-10-03 DIAGNOSIS — Z79899 Other long term (current) drug therapy: Secondary | ICD-10-CM | POA: Diagnosis not present

## 2018-10-12 DIAGNOSIS — D2239 Melanocytic nevi of other parts of face: Secondary | ICD-10-CM | POA: Diagnosis not present

## 2018-10-12 DIAGNOSIS — L821 Other seborrheic keratosis: Secondary | ICD-10-CM | POA: Diagnosis not present

## 2018-10-19 DIAGNOSIS — Z1389 Encounter for screening for other disorder: Secondary | ICD-10-CM | POA: Diagnosis not present

## 2018-10-19 DIAGNOSIS — G8929 Other chronic pain: Secondary | ICD-10-CM | POA: Diagnosis not present

## 2018-10-19 DIAGNOSIS — F411 Generalized anxiety disorder: Secondary | ICD-10-CM | POA: Diagnosis not present

## 2018-10-19 DIAGNOSIS — Z6823 Body mass index (BMI) 23.0-23.9, adult: Secondary | ICD-10-CM | POA: Diagnosis not present

## 2018-10-19 DIAGNOSIS — Z794 Long term (current) use of insulin: Secondary | ICD-10-CM | POA: Diagnosis not present

## 2018-10-19 DIAGNOSIS — R300 Dysuria: Secondary | ICD-10-CM | POA: Diagnosis not present

## 2018-10-19 DIAGNOSIS — E785 Hyperlipidemia, unspecified: Secondary | ICD-10-CM | POA: Diagnosis not present

## 2018-10-19 DIAGNOSIS — E1165 Type 2 diabetes mellitus with hyperglycemia: Secondary | ICD-10-CM | POA: Diagnosis not present

## 2018-10-19 DIAGNOSIS — E11319 Type 2 diabetes mellitus with unspecified diabetic retinopathy without macular edema: Secondary | ICD-10-CM | POA: Diagnosis not present

## 2018-10-19 DIAGNOSIS — D473 Essential (hemorrhagic) thrombocythemia: Secondary | ICD-10-CM | POA: Diagnosis not present

## 2018-10-19 DIAGNOSIS — I10 Essential (primary) hypertension: Secondary | ICD-10-CM | POA: Diagnosis not present

## 2019-01-22 DIAGNOSIS — R300 Dysuria: Secondary | ICD-10-CM | POA: Diagnosis not present

## 2019-01-22 DIAGNOSIS — I10 Essential (primary) hypertension: Secondary | ICD-10-CM | POA: Diagnosis not present

## 2019-01-22 DIAGNOSIS — Z79899 Other long term (current) drug therapy: Secondary | ICD-10-CM | POA: Diagnosis not present

## 2019-01-22 DIAGNOSIS — Z794 Long term (current) use of insulin: Secondary | ICD-10-CM | POA: Diagnosis not present

## 2019-01-22 DIAGNOSIS — Z5181 Encounter for therapeutic drug level monitoring: Secondary | ICD-10-CM | POA: Diagnosis not present

## 2019-01-22 DIAGNOSIS — N390 Urinary tract infection, site not specified: Secondary | ICD-10-CM | POA: Diagnosis not present

## 2019-01-22 DIAGNOSIS — E1165 Type 2 diabetes mellitus with hyperglycemia: Secondary | ICD-10-CM | POA: Diagnosis not present

## 2019-01-22 DIAGNOSIS — F411 Generalized anxiety disorder: Secondary | ICD-10-CM | POA: Diagnosis not present

## 2019-01-23 DIAGNOSIS — D473 Essential (hemorrhagic) thrombocythemia: Secondary | ICD-10-CM | POA: Diagnosis not present

## 2019-01-23 DIAGNOSIS — I10 Essential (primary) hypertension: Secondary | ICD-10-CM | POA: Diagnosis not present

## 2019-01-23 DIAGNOSIS — Z79891 Long term (current) use of opiate analgesic: Secondary | ICD-10-CM | POA: Diagnosis not present

## 2019-01-23 DIAGNOSIS — E119 Type 2 diabetes mellitus without complications: Secondary | ICD-10-CM | POA: Diagnosis not present

## 2019-01-23 DIAGNOSIS — E1165 Type 2 diabetes mellitus with hyperglycemia: Secondary | ICD-10-CM | POA: Diagnosis not present

## 2019-03-08 DIAGNOSIS — H52223 Regular astigmatism, bilateral: Secondary | ICD-10-CM | POA: Diagnosis not present

## 2019-03-08 DIAGNOSIS — Z794 Long term (current) use of insulin: Secondary | ICD-10-CM | POA: Diagnosis not present

## 2019-03-08 DIAGNOSIS — E119 Type 2 diabetes mellitus without complications: Secondary | ICD-10-CM | POA: Diagnosis not present

## 2019-03-08 DIAGNOSIS — E083212 Diabetes mellitus due to underlying condition with mild nonproliferative diabetic retinopathy with macular edema, left eye: Secondary | ICD-10-CM | POA: Diagnosis not present

## 2019-03-08 DIAGNOSIS — H5213 Myopia, bilateral: Secondary | ICD-10-CM | POA: Diagnosis not present

## 2019-03-18 DIAGNOSIS — M79641 Pain in right hand: Secondary | ICD-10-CM | POA: Diagnosis not present

## 2019-03-20 DIAGNOSIS — M79671 Pain in right foot: Secondary | ICD-10-CM | POA: Diagnosis not present

## 2019-04-12 DIAGNOSIS — R55 Syncope and collapse: Secondary | ICD-10-CM | POA: Diagnosis not present

## 2019-04-12 DIAGNOSIS — R531 Weakness: Secondary | ICD-10-CM | POA: Diagnosis not present

## 2019-04-12 DIAGNOSIS — Z8673 Personal history of transient ischemic attack (TIA), and cerebral infarction without residual deficits: Secondary | ICD-10-CM | POA: Diagnosis not present

## 2019-04-12 DIAGNOSIS — R51 Headache: Secondary | ICD-10-CM | POA: Diagnosis not present

## 2019-04-12 DIAGNOSIS — R42 Dizziness and giddiness: Secondary | ICD-10-CM | POA: Diagnosis not present

## 2019-04-16 DIAGNOSIS — Z8673 Personal history of transient ischemic attack (TIA), and cerebral infarction without residual deficits: Secondary | ICD-10-CM | POA: Diagnosis not present

## 2019-04-16 DIAGNOSIS — I10 Essential (primary) hypertension: Secondary | ICD-10-CM | POA: Diagnosis not present

## 2019-04-16 DIAGNOSIS — R42 Dizziness and giddiness: Secondary | ICD-10-CM | POA: Diagnosis not present

## 2019-04-16 DIAGNOSIS — R531 Weakness: Secondary | ICD-10-CM | POA: Diagnosis not present

## 2019-04-16 DIAGNOSIS — R55 Syncope and collapse: Secondary | ICD-10-CM | POA: Diagnosis not present

## 2019-04-16 DIAGNOSIS — F419 Anxiety disorder, unspecified: Secondary | ICD-10-CM | POA: Diagnosis not present

## 2019-04-16 DIAGNOSIS — Z5181 Encounter for therapeutic drug level monitoring: Secondary | ICD-10-CM | POA: Diagnosis not present

## 2019-04-16 DIAGNOSIS — Z79899 Other long term (current) drug therapy: Secondary | ICD-10-CM | POA: Diagnosis not present

## 2019-04-16 DIAGNOSIS — E119 Type 2 diabetes mellitus without complications: Secondary | ICD-10-CM | POA: Diagnosis not present

## 2019-04-16 DIAGNOSIS — R51 Headache: Secondary | ICD-10-CM | POA: Diagnosis not present

## 2019-04-18 ENCOUNTER — Encounter: Payer: Self-pay | Admitting: Neurology

## 2019-04-24 NOTE — Progress Notes (Deleted)
NEUROLOGY CONSULTATION NOTE  DARASIMI WALP MRN: [id number] DOB: [birth year]  Referring provider: Jaclynn Major, NP Primary care provider: Townsend Roger, MD  Reason for consult:  CVA  HISTORY OF PRESENT ILLNESS: Allison Stevens is a 53 year old ***-handed black female with hypertension, type 2 diabetes mellitus with diabetic retinopathy, thrombocythemia, migraines, anxiety and history of CVA who presents for CVA.  History supplemented by referring provider note.  For the past 3 weeks, she has been feeling unsteady on her felt and weak.  She feels like her legs are going to give out when she walks.  ***.  She did go to the ED at [facility] on 04/12/2019 for further evaluation.  CT head performed reportedly showed no acute intracranial abnormality.  She had a syncopal episode that occurred on 04/15/2019.  While brushing her hair, ***.  No preceding trigger.  When she woke up on the floor, she felt foggy-headed, dizzy, nauseous and lethargic.  She noted a headache.    She denies chest pain. She has diabetes and her blood sugars have been between 150 and 250 fasting.    PAST MEDICAL HISTORY: Past Medical History:  Diagnosis Date  . Acid reflux   . Diabetes ([place]) 2002  . Gait abnormality   . Hypertension   . Migraine   . Stroke ([place])   . Thrombocytosis ([place])     PAST SURGICAL HISTORY: Past Surgical History:  Procedure Laterality Date  . CESAREAN SECTION  1997  . GALLBLADDER SURGERY  2011    MEDICATIONS: Current Outpatient Medications on File Prior to Visit  Medication Sig Dispense Refill  . ALPRAZolam (XANAX) 0.5 MG tablet Take 0.5 mg by mouth at bedtime as needed for anxiety.    Marland Kitchen amLODipine (NORVASC) 10 MG tablet Take 10 mg by mouth daily.    Marland Kitchen escitalopram (LEXAPRO) 10 MG tablet Take 10 mg by mouth daily.    . hydroxyurea (HYDREA) 500 MG capsule Take 500 mg by mouth 4 (four) times daily. May take with food to minimize GI side effects.    Marland Kitchen ibuprofen (ADVIL,MOTRIN)  800 MG tablet Take 800 mg by mouth every 8 (eight) hours as needed.    . insulin aspart (NOVOLOG) 100 UNIT/ML injection Inject 34 Units into the skin 3 (three) times daily before meals.    . Insulin Glargine (TOUJEO MAX SOLOSTAR) 300 UNIT/ML SOPN Inject 72 Units into the skin daily.    . metFORMIN (GLUCOPHAGE) 1000 MG tablet Take 1,000 mg by mouth 2 (two) times daily with a meal.    . montelukast (SINGULAIR) 10 MG tablet Take 10 mg by mouth at bedtime.    . niacin 500 MG tablet Take 500 mg by mouth at bedtime.    Marland Kitchen oxycodone (OXY-IR) 5 MG capsule Take 10 mg by mouth every 4 (four) hours as needed.    Marland Kitchen oxyCODONE (OXYCONTIN) 40 mg 12 hr tablet Take 40 mg by mouth every 12 (twelve) hours.    . pantoprazole (PROTONIX) 40 MG tablet Take 1 tablet (40 mg total) by mouth daily. 30 tablet 6  . pravastatin (PRAVACHOL) 40 MG tablet Take 40 mg by mouth daily.    . prochlorperazine (COMPAZINE) 10 MG tablet Take 10 mg by mouth every 6 (six) hours as needed for nausea or vomiting.     No current facility-administered medications on file prior to visit.     ALLERGIES: Allergies  Allergen Reactions  . Iodinated Diagnostic Agents Other (See Comments)    FAMILY HISTORY:  Family History  Problem Relation Age of Onset  . Diabetes Mother   . Cancer Mother   . Cancer Father    ***.  SOCIAL HISTORY: Social History   Socioeconomic History  . Marital status: Married    Spouse name: Not on file  . Number of children: Not on file  . Years of education: Not on file  . Highest education level: Not on file  Occupational History  . Not on file  Social Needs  . Financial resource strain: Not on file  . Food insecurity    Worry: Not on file    Inability: Not on file  . Transportation needs    Medical: Not on file    Non-medical: Not on file  Tobacco Use  . Smoking status: Never Smoker  . Smokeless tobacco: Never Used  Substance and Sexual Activity  . Alcohol use: No  . Drug use: No  . Sexual  activity: Not on file  Lifestyle  . Physical activity    Days per week: Not on file    Minutes per session: Not on file  . Stress: Not on file  Relationships  . Social Herbalist on phone: Not on file    Gets together: Not on file    Attends religious service: Not on file    Active member of club or organization: Not on file    Attends meetings of clubs or organizations: Not on file    Relationship status: Not on file  . Intimate partner violence    Fear of current or ex partner: Not on file    Emotionally abused: Not on file    Physically abused: Not on file    Forced sexual activity: Not on file  Other Topics Concern  . Not on file  Social History Narrative  . Not on file    REVIEW OF SYSTEMS: Constitutional: No fevers, chills, or sweats, no generalized fatigue, change in appetite Eyes: No visual changes, double vision, eye pain Ear, nose and throat: No hearing loss, ear pain, nasal congestion, sore throat Cardiovascular: No chest pain, palpitations Respiratory:  No shortness of breath at rest or with exertion, wheezes GastrointestinaI: No nausea, vomiting, diarrhea, abdominal pain, fecal incontinence Genitourinary:  No dysuria, urinary retention or frequency Musculoskeletal:  No neck pain, back pain Integumentary: No rash, pruritus, skin lesions Neurological: as above Psychiatric: No depression, insomnia, anxiety Endocrine: No palpitations, fatigue, diaphoresis, mood swings, change in appetite, change in weight, increased thirst Hematologic/Lymphatic:  No purpura, petechiae. Allergic/Immunologic: no itchy/runny eyes, nasal congestion, recent allergic reactions, rashes  PHYSICAL EXAM: *** General: No acute distress.  Patient appears ***-groomed.  *** Head:  Normocephalic/atraumatic Eyes:  fundi examined but not visualized Neck: supple, no paraspinal tenderness, full range of motion Back: No paraspinal tenderness Heart: regular rate and rhythm Lungs: Clear  to auscultation bilaterally. Vascular: No carotid bruits. Neurological Exam: Mental status: alert and oriented to person, place, and time, recent and remote memory intact, fund of knowledge intact, attention and concentration intact, speech fluent and not dysarthric, language intact. Cranial nerves: CN I: not tested CN II: pupils equal, round and reactive to light, visual fields intact CN III, IV, VI:  full range of motion, no nystagmus, no ptosis CN V: facial sensation intact CN VII: upper and lower face symmetric CN VIII: hearing intact CN IX, X: gag intact, uvula midline CN XI: sternocleidomastoid and trapezius muscles intact CN XII: tongue midline Bulk & Tone: normal, no fasciculations. Motor:  5/5 throughout *** Sensation:  Pinprick *** temperature *** and vibration sensation intact.  ***. Deep Tendon Reflexes:  2+ throughout, *** toes downgoing.  *** Finger to nose testing:  Without dysmetria.  *** Heel to shin:  Without dysmetria.  *** Gait:  Normal station and stride.  Able to turn and tandem walk. Romberg ***.  IMPRESSION: ***  PLAN: ***  Thank you for allowing me to take part in the care of this patient.  Metta Clines, DO  CC: ***

## 2019-04-26 ENCOUNTER — Ambulatory Visit: Payer: Medicare Other | Admitting: Neurology

## 2019-04-29 DIAGNOSIS — E11319 Type 2 diabetes mellitus with unspecified diabetic retinopathy without macular edema: Secondary | ICD-10-CM | POA: Diagnosis not present

## 2019-04-29 DIAGNOSIS — Z7901 Long term (current) use of anticoagulants: Secondary | ICD-10-CM | POA: Diagnosis not present

## 2019-04-29 DIAGNOSIS — F419 Anxiety disorder, unspecified: Secondary | ICD-10-CM | POA: Diagnosis not present

## 2019-04-29 DIAGNOSIS — Z5181 Encounter for therapeutic drug level monitoring: Secondary | ICD-10-CM | POA: Diagnosis not present

## 2019-04-29 DIAGNOSIS — Z79899 Other long term (current) drug therapy: Secondary | ICD-10-CM | POA: Diagnosis not present

## 2019-05-06 DIAGNOSIS — D473 Essential (hemorrhagic) thrombocythemia: Secondary | ICD-10-CM | POA: Diagnosis not present

## 2019-05-06 DIAGNOSIS — K449 Diaphragmatic hernia without obstruction or gangrene: Secondary | ICD-10-CM | POA: Diagnosis not present

## 2019-05-06 DIAGNOSIS — K862 Cyst of pancreas: Secondary | ICD-10-CM | POA: Diagnosis not present

## 2019-05-06 DIAGNOSIS — R935 Abnormal findings on diagnostic imaging of other abdominal regions, including retroperitoneum: Secondary | ICD-10-CM | POA: Diagnosis not present

## 2019-05-16 DIAGNOSIS — F411 Generalized anxiety disorder: Secondary | ICD-10-CM | POA: Diagnosis not present

## 2019-05-16 DIAGNOSIS — M79641 Pain in right hand: Secondary | ICD-10-CM | POA: Diagnosis not present

## 2019-05-16 DIAGNOSIS — I10 Essential (primary) hypertension: Secondary | ICD-10-CM | POA: Diagnosis not present

## 2019-05-16 DIAGNOSIS — Z79899 Other long term (current) drug therapy: Secondary | ICD-10-CM | POA: Diagnosis not present

## 2019-05-16 DIAGNOSIS — Z5181 Encounter for therapeutic drug level monitoring: Secondary | ICD-10-CM | POA: Diagnosis not present

## 2019-05-16 DIAGNOSIS — M7989 Other specified soft tissue disorders: Secondary | ICD-10-CM | POA: Diagnosis not present

## 2019-05-16 DIAGNOSIS — M25531 Pain in right wrist: Secondary | ICD-10-CM | POA: Diagnosis not present

## 2019-05-27 NOTE — Progress Notes (Signed)
NEUROLOGY CONSULTATION NOTE  Allison Stevens MRN: [id number] DOB: January 28, 1966  Referring provider: Townsend Roger, MD Primary care provider: Townsend Roger, MD  Reason for consult:  Stroke  HISTORY OF PRESENT ILLNESS: Allison Stevens is a 53 year old right-handed black female with type 2 diabetes mellitus, hypertension, anxiety who presents for stroke.  History supplemented by referring provider note and prior neurology notes.  Since late August, she has been feeling unsteady on her felt and weak.  She feels like her legs are going to give out when she walks.  CT head performed reportedly showed no acute intracranial abnormality.  She had a syncopal episode that occurred on 04/15/2019.  While brushing her hair and then the next thing she remembers was laying on the floor in the bathroom with EMS and her family talking to her.  Her son found her on the floor unconscious.  It was suspected that she may have been on the floor for 3 hours.  No preceding trigger.  When she woke up on the floor, she felt foggy-headed, dizzy, nauseous and lethargic.  She noted a headache.    No associated facial droop, visual disturbance, or unilateral numbness or weakness.  She denies chest pain. She has diabetes and her blood sugars have been between 150 and 250 fasting.  She refused admission to the hospital but had another CT head which reportedly showed no acute abnormality.  She reports history of several strokes.  Symptoms included left sided weakness with headache and altered speech.  She has thrombocytosis and fibromuscular dysplasia.  Prior MRI of brain from 2017 showed stable white matter disease and focal chronic lacunar infarcts in the right caudate, which was reportedly new compared to a prior study in 2015.  Her episodes include headache, blurred vision, altered speech and left sided weakness and numbness.  When severe, she has passed out in the past.  They occur when she gets upset, usually once every 2  months.  She treats it with Tylenol and laying down on the bed.  For secondary stroke prevention, she has been treated with warfarin, ASA and Plavix.  She is no longer on any blood thinners.  She says she was taken off of it but not sure why.  Headaches previously been treated with Depakote ER 500mg  twice day, which was ineffective.  She has residual left sided weakness of arm and leg.  She has tried physical therapy.  She does see hematology at [facility] regarding thrombocytosis.  She is being monitored for development of leukemia.    For the past 2 months, she reports burning pain along the lateral aspect of her right hand and forearm up to the elbow.  She notes some associated finger weakness as well.     PAST MEDICAL HISTORY: Past Medical History:  Diagnosis Date   Acid reflux    Diabetes ([place]) 2002   Gait abnormality    Hypertension    Migraine    Stroke ([place])    Thrombocytosis ([place])     PAST SURGICAL HISTORY: Past Surgical History:  Procedure Laterality Date   CESAREAN SECTION  1997   GALLBLADDER SURGERY  2011    MEDICATIONS: Current Outpatient Medications on File Prior to Visit  Medication Sig Dispense Refill   ALPRAZolam (XANAX) 0.5 MG tablet Take 0.5 mg by mouth at bedtime as needed for anxiety.     amLODipine (NORVASC) 10 MG tablet Take 10 mg by mouth daily.     escitalopram (LEXAPRO) 10  MG tablet Take 10 mg by mouth daily.     hydroxyurea (HYDREA) 500 MG capsule Take 500 mg by mouth 4 (four) times daily. May take with food to minimize GI side effects.     ibuprofen (ADVIL,MOTRIN) 800 MG tablet Take 800 mg by mouth every 8 (eight) hours as needed.     insulin aspart (NOVOLOG) 100 UNIT/ML injection Inject 34 Units into the skin 3 (three) times daily before meals.     Insulin Glargine (TOUJEO MAX SOLOSTAR) 300 UNIT/ML SOPN Inject 72 Units into the skin daily.     metFORMIN (GLUCOPHAGE) 1000 MG tablet Take 1,000 mg by mouth 2 (two) times daily  with a meal.     montelukast (SINGULAIR) 10 MG tablet Take 10 mg by mouth at bedtime.     niacin 500 MG tablet Take 500 mg by mouth at bedtime.     oxycodone (OXY-IR) 5 MG capsule Take 10 mg by mouth every 4 (four) hours as needed.     oxyCODONE (OXYCONTIN) 40 mg 12 hr tablet Take 40 mg by mouth every 12 (twelve) hours.     pantoprazole (PROTONIX) 40 MG tablet Take 1 tablet (40 mg total) by mouth daily. 30 tablet 6   pravastatin (PRAVACHOL) 40 MG tablet Take 40 mg by mouth daily.     prochlorperazine (COMPAZINE) 10 MG tablet Take 10 mg by mouth every 6 (six) hours as needed for nausea or vomiting.     No current facility-administered medications on file prior to visit.     ALLERGIES: Allergies  Allergen Reactions   Iodinated Diagnostic Agents Other (See Comments)    FAMILY HISTORY: Family History  Problem Relation Age of Onset   Diabetes Mother    Cancer Mother    Cancer Father    SOCIAL HISTORY: Social History   Socioeconomic History   Marital status: Married    Spouse name: Not on file   Number of children: Not on file   Years of education: Not on file   Highest education level: Not on file  Occupational History   Not on file  Social Needs   Financial resource strain: Not on file   Food insecurity    Worry: Not on file    Inability: Not on file   Transportation needs    Medical: Not on file    Non-medical: Not on file  Tobacco Use   Smoking status: Never Smoker   Smokeless tobacco: Never Used  Substance and Sexual Activity   Alcohol use: No   Drug use: No   Sexual activity: Not on file  Lifestyle   Physical activity    Days per week: Not on file    Minutes per session: Not on file   Stress: Not on file  Relationships   Social connections    Talks on phone: Not on file    Gets together: Not on file    Attends religious service: Not on file    Active member of club or organization: Not on file    Attends meetings of clubs or  organizations: Not on file    Relationship status: Not on file   Intimate partner violence    Fear of current or ex partner: Not on file    Emotionally abused: Not on file    Physically abused: Not on file    Forced sexual activity: Not on file  Other Topics Concern   Not on file  Social History Narrative   Not on file  REVIEW OF SYSTEMS: Constitutional: No fevers, chills, or sweats, no generalized fatigue, change in appetite Eyes: No visual changes, double vision, eye pain Ear, nose and throat: No hearing loss, ear pain, nasal congestion, sore throat Cardiovascular: No chest pain, palpitations Respiratory:  No shortness of breath at rest or with exertion, wheezes GastrointestinaI: No nausea, vomiting, diarrhea, abdominal pain, fecal incontinence Genitourinary:  No dysuria, urinary retention or frequency Musculoskeletal:  No neck pain, back pain Integumentary: No rash, pruritus, skin lesions Neurological: as above Psychiatric: No depression, insomnia, anxiety Endocrine: No palpitations, fatigue, diaphoresis, mood swings, change in appetite, change in weight, increased thirst Hematologic/Lymphatic:  No purpura, petechiae. Allergic/Immunologic: no itchy/runny eyes, nasal congestion, recent allergic reactions, rashes  PHYSICAL EXAM: Blood pressure (!) 181/81, pulse 79, height 5\' 4"  (1.626 m), weight 131 lb 6.4 oz (59.6 kg), SpO2 100 %. General: No acute distress.  Patient appears well-groomed.  Head:  Normocephalic/atraumatic Eyes:  fundi examined but not visualized Neck: supple, no paraspinal tenderness, full range of motion Back: No paraspinal tenderness Heart: regular rate and rhythm Lungs: Clear to auscultation bilaterally. Vascular: No carotid bruits. Neurological Exam: Mental status: alert and oriented to person, place, and time, recent and remote memory intact, fund of knowledge intact, attention and concentration intact, speech fluent and not dysarthric, language  intact. Cranial nerves: CN I: not tested CN II: pupils equal, round and reactive to light, visual fields intact CN III, IV, VI:  full range of motion, no nystagmus, no ptosis CN V: facial sensation intact CN VII: upper and lower face symmetric CN VIII: hearing intact CN IX, X: gag intact, uvula midline CN XI: sternocleidomastoid and trapezius muscles intact CN XII: tongue midline Bulk & Tone: normal, no fasciculations. Motor:  3+/5 right adductor digiti minimi, 4+/5 left hand grip, 4+/5 left hip flexion.  Otherwise, 5/5 throughout Sensation:  Pinprick sensation slightly decreased in left upper extremity and along the ulnar aspect of right hand; and vibration sensation intact.. Deep Tendon Reflexes:  2+ throughout, toes downgoing.  Finger to nose testing:  Without dysmetria.  Heel to shin:  Without dysmetria.  Gait:  Hemiparetic gait.  Romberg negative  IMPRESSION: 1.  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  Possible spells may be migraine with aura a well.   2.  Migraine with aura, without status migrainosus, not intractable 3.  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 4.  Essential thrombocytosis 4.  Hypertension 5.  Type 2 diabetes mellitus  1.  We will check MRI and MRA of brain and carotid doppler 2.  Migraines are infrequent, so will defer prophylactic treatment 3.  I am not sure why she is not on antiplatelet therapy.  I advise the PCP to start antiplatelet therapy (ASA or Plavix) if there is no contraindication. 4.  Follow up with PCP regarding elevated blood pressure 5.  On pravastatin (LDL goal less than 70) 6.  Glycemic control 7.  NCV-EMG of right upper extremity 8.  Follow up in 4 months.  Thank you for allowing me to take part in the care of this patient.  Metta Clines, DO  CC:  Townsend Roger, MD

## 2019-05-28 ENCOUNTER — Other Ambulatory Visit: Payer: Self-pay

## 2019-05-28 ENCOUNTER — Ambulatory Visit (INDEPENDENT_AMBULATORY_CARE_PROVIDER_SITE_OTHER): Payer: Medicare Other | Admitting: Neurology

## 2019-05-28 ENCOUNTER — Encounter: Payer: Self-pay | Admitting: Neurology

## 2019-05-28 VITALS — BP 181/81 | HR 79 | Ht 64.0 in | Wt 131.4 lb

## 2019-05-28 DIAGNOSIS — G5621 Lesion of ulnar nerve, right upper limb: Secondary | ICD-10-CM

## 2019-05-28 DIAGNOSIS — I639 Cerebral infarction, unspecified: Secondary | ICD-10-CM | POA: Diagnosis not present

## 2019-05-28 DIAGNOSIS — I10 Essential (primary) hypertension: Secondary | ICD-10-CM | POA: Diagnosis not present

## 2019-05-28 DIAGNOSIS — G43109 Migraine with aura, not intractable, without status migrainosus: Secondary | ICD-10-CM

## 2019-05-28 DIAGNOSIS — D473 Essential (hemorrhagic) thrombocythemia: Secondary | ICD-10-CM

## 2019-05-28 NOTE — Patient Instructions (Addendum)
1.  We will check CTA of head and neck- change due to patient contrast allergy- MRI / MRA / VAS US Carotid   2.  We will check a nerve study of right arm. 3.  Follow up in 4 months  A referral to [facility] has been placed for your MRI someone will contact you directly to schedule your appt. They are located at [place]. Please contact them directly by calling [phone] with any questions regarding your referral.  Please call [phone] to schedule US CAROTID at [facility]

## 2019-06-03 ENCOUNTER — Ambulatory Visit (HOSPITAL_COMMUNITY)
Admission: RE | Admit: 2019-06-03 | Discharge: 2019-06-03 | Disposition: A | Discharge status: Home / Self Care | Payer: Medicare Other | Source: Ambulatory Visit | Attending: Neurology | Admitting: Neurology

## 2019-06-03 ENCOUNTER — Other Ambulatory Visit: Payer: Self-pay

## 2019-06-03 ENCOUNTER — Telehealth: Payer: Self-pay

## 2019-06-03 DIAGNOSIS — I639 Cerebral infarction, unspecified: Secondary | ICD-10-CM | POA: Diagnosis not present

## 2019-06-03 DIAGNOSIS — G43109 Migraine with aura, not intractable, without status migrainosus: Secondary | ICD-10-CM | POA: Diagnosis not present

## 2019-06-03 DIAGNOSIS — G5621 Lesion of ulnar nerve, right upper limb: Secondary | ICD-10-CM

## 2019-06-03 DIAGNOSIS — D473 Essential (hemorrhagic) thrombocythemia: Secondary | ICD-10-CM | POA: Diagnosis not present

## 2019-06-03 NOTE — Telephone Encounter (Signed)
-----   Message from Cameron Sprang, MD sent at 06/03/2019  3:34 PM EST ----- Pls let patient know carotid ultrasound did not show any significant blockage. Thanks!

## 2019-06-03 NOTE — Telephone Encounter (Signed)
Called spoke with patient she was informed of provider response regarding results and understands.

## 2019-06-03 NOTE — Progress Notes (Signed)
Carotid duplex       has been completed. Preliminary results can be found under CV proc through chart review. Arlin Sass, BS, RDMS, RVT   

## 2019-06-10 ENCOUNTER — Other Ambulatory Visit: Payer: Self-pay

## 2019-06-10 DIAGNOSIS — I639 Cerebral infarction, unspecified: Secondary | ICD-10-CM

## 2019-06-10 NOTE — Addendum Note (Signed)
Addended by: Ranae Plumber on: 06/10/2019 09:04 AM   Modules accepted: Orders

## 2019-06-10 NOTE — Progress Notes (Signed)
Received call from Mardene Celeste at [facility] requesting MRI BRAIN W/ CONTRAST be changed to w/o or w/w/o  Spoke with provider order change to MRI BRAIN W/O

## 2019-06-13 DIAGNOSIS — F419 Anxiety disorder, unspecified: Secondary | ICD-10-CM | POA: Diagnosis not present

## 2019-06-13 DIAGNOSIS — Z5181 Encounter for therapeutic drug level monitoring: Secondary | ICD-10-CM | POA: Diagnosis not present

## 2019-06-13 DIAGNOSIS — M25531 Pain in right wrist: Secondary | ICD-10-CM | POA: Diagnosis not present

## 2019-06-13 DIAGNOSIS — Z79899 Other long term (current) drug therapy: Secondary | ICD-10-CM | POA: Diagnosis not present

## 2019-06-13 DIAGNOSIS — E1165 Type 2 diabetes mellitus with hyperglycemia: Secondary | ICD-10-CM | POA: Diagnosis not present

## 2019-06-13 DIAGNOSIS — E11319 Type 2 diabetes mellitus with unspecified diabetic retinopathy without macular edema: Secondary | ICD-10-CM | POA: Diagnosis not present

## 2019-06-24 ENCOUNTER — Other Ambulatory Visit: Payer: Self-pay

## 2019-06-25 ENCOUNTER — Encounter: Payer: Medicare Other | Admitting: Neurology

## 2019-07-12 DIAGNOSIS — D473 Essential (hemorrhagic) thrombocythemia: Secondary | ICD-10-CM | POA: Diagnosis not present

## 2019-07-16 ENCOUNTER — Encounter: Payer: Medicare Other | Admitting: Neurology

## 2019-08-06 ENCOUNTER — Encounter: Payer: Medicare Other | Admitting: Neurology

## 2019-08-12 DIAGNOSIS — Z79899 Other long term (current) drug therapy: Secondary | ICD-10-CM | POA: Diagnosis not present

## 2019-10-01 DIAGNOSIS — E1165 Type 2 diabetes mellitus with hyperglycemia: Secondary | ICD-10-CM | POA: Diagnosis not present

## 2019-10-01 DIAGNOSIS — F419 Anxiety disorder, unspecified: Secondary | ICD-10-CM | POA: Diagnosis not present

## 2019-10-01 DIAGNOSIS — Z79899 Other long term (current) drug therapy: Secondary | ICD-10-CM | POA: Diagnosis not present

## 2019-10-01 DIAGNOSIS — Z5181 Encounter for therapeutic drug level monitoring: Secondary | ICD-10-CM | POA: Diagnosis not present

## 2019-10-01 DIAGNOSIS — I10 Essential (primary) hypertension: Secondary | ICD-10-CM | POA: Diagnosis not present

## 2019-10-02 NOTE — Progress Notes (Signed)
Virtual Visit via Video Note The purpose of this virtual visit is to provide medical care while limiting exposure to the novel coronavirus.    Consent was obtained for video visit:  Yes.   Answered questions that patient had about telehealth interaction:  Yes.   I discussed the limitations, risks, security and privacy concerns of performing an evaluation and management service by telemedicine. I also discussed with the patient that there may be a patient responsible charge related to this service. The patient expressed understanding and agreed to proceed.  Pt location: Home Physician Location: office Name of referring provider:  Townsend Roger, MD I connected with Allison Stevens at patients initiation/request on 10/03/2019 at 10:50 AM EST by video enabled telemedicine application and verified that I am speaking with the correct person using two identifiers. Pt MRN:  [id number] Pt DOB:  1966/07/14 Video Participants:  Allison Stevens   History of Present Illness:  Allison Stevens is a 54 year old right-handed black female with type 2 diabetes mellitus, hypertension, anxiety who follows up for stroke.  UPDATE: 06/03/2019 Carotid US:  No hemodynamically significant ICA stenosis. MRI/MRA of brain was not performed.  It appears that it was never scheduled.  NCV-EMG right upper extremity was not performed because she was never called to schedule.  She has been doing well.  Memory is an issue.  Her husband does most of the driving because she may forget where she is.  Her husband monitors her medication and fills her pillbox.  She is now on Plavix.  HISTORY: Since late August 2020, she has been feeling unsteady on her felt and weak. She feels like her legs are going to give out when she walks. CT head performed reportedly showed no acute intracranial abnormality. She had a syncopal episode that occurred on 04/15/2019. While brushing her hair and then the next thing she  remembers was laying on the floor in the bathroom with EMS and her family talking to her.  Her son found her on the floor unconscious.  It was suspected that she may have been on the floor for 3 hours. No preceding trigger. When she woke up on the floor, she felt foggy-headed, dizzy, nauseous and lethargic. She noted a headache. No associated facial droop, visual disturbance, or unilateral numbness or weakness.  She denies chest pain. She has diabetes and her blood sugars have been between 150 and 250 fasting. She refused admission to the hospital but had another CT head which reportedly showed no acute abnormality.  She reports history of several strokes.  Symptoms included left sided weakness with headache and altered speech.  She has thrombocytosis and fibromuscular dysplasia.  Prior MRI of brain from 2017 showed stable white matter disease and focal chronic lacunar infarcts in the right caudate, which was reportedly new compared to a prior study in 2015.  Her episodes include headache, blurred vision, altered speech and left sided weakness and numbness.  When severe, she has passed out in the past.  They occur when she gets upset, usually once every 2 months.  She treats it with Tylenol and laying down on the bed.  For secondary stroke prevention, she has been treated with warfarin, ASA and Plavix.  She is no longer on any blood thinners.  She says she was taken off of it but not sure why.  Headaches previously been treated with Depakote ER 500mg  twice day, which was ineffective.  She has residual left sided weakness of arm  and leg.  She has tried physical therapy.  She does see hematology at [facility] regarding thrombocytosis.  She is being monitored for development of leukemia.    For the past 2 months, she reports burning pain along the lateral aspect of her right hand and forearm up to the elbow.  She notes some associated finger weakness as well.    PAST MEDICAL HISTORY: Past  Medical History:  Diagnosis Date  . Acid reflux   . Diabetes ([place]) 2002  . Gait abnormality   . Hypertension   . Migraine   . Stroke ([place])   . Thrombocytosis ([place])     MEDICATIONS: Current Outpatient Medications on File Prior to Visit  Medication Sig Dispense Refill  . ALPRAZolam (XANAX) 0.5 MG tablet Take 0.5 mg by mouth at bedtime as needed for anxiety.    Marland Kitchen amLODipine (NORVASC) 10 MG tablet Take 10 mg by mouth daily.    . clopidogrel (PLAVIX) 75 MG tablet Take 75 mg by mouth daily.    . enalapril (VASOTEC) 10 MG tablet Take 10 mg by mouth 2 (two) times daily.    Marland Kitchen escitalopram (LEXAPRO) 10 MG tablet Take 10 mg by mouth daily.    . hydroxyurea (HYDREA) 500 MG capsule Take 500 mg by mouth 4 (four) times daily. May take with food to minimize GI side effects.    Marland Kitchen ibuprofen (ADVIL,MOTRIN) 800 MG tablet Take 800 mg by mouth every 8 (eight) hours as needed.    . insulin aspart (NOVOLOG) 100 UNIT/ML injection Inject 34 Units into the skin 3 (three) times daily before meals.    . Insulin Detemir (LEVEMIR) 100 UNIT/ML Pen Inject into the skin.    . Insulin Glargine (TOUJEO MAX SOLOSTAR) 300 UNIT/ML SOPN Inject 72 Units into the skin daily.    . metFORMIN (GLUCOPHAGE) 1000 MG tablet Take 1,000 mg by mouth 2 (two) times daily with a meal.    . montelukast (SINGULAIR) 10 MG tablet Take 10 mg by mouth at bedtime.    . niacin 500 MG tablet Take 500 mg by mouth at bedtime.    Marland Kitchen oxycodone (OXY-IR) 5 MG capsule Take 10 mg by mouth every 4 (four) hours as needed.    Marland Kitchen oxyCODONE (OXYCONTIN) 40 mg 12 hr tablet Take 40 mg by mouth every 12 (twelve) hours.    . pantoprazole (PROTONIX) 40 MG tablet Take 1 tablet (40 mg total) by mouth daily. 30 tablet 6  . pravastatin (PRAVACHOL) 40 MG tablet Take 40 mg by mouth daily.    . prochlorperazine (COMPAZINE) 10 MG tablet Take 10 mg by mouth every 6 (six) hours as needed for nausea or vomiting.    . TRULICITY 3 0000000 SOPN      No current  facility-administered medications on file prior to visit.    ALLERGIES: Allergies  Allergen Reactions  . Iodinated Diagnostic Agents Other (See Comments) and Shortness Of Breath    FAMILY HISTORY: Family History  Problem Relation Age of Onset  . Diabetes Mother   . Cancer Mother   . Cancer Father   . Healthy Child     SOCIAL HISTORY: Social History   Socioeconomic History  . Marital status: Married    Spouse name: Not on file  . Number of children: 4  . Years of education: Not on file  . Highest education level: 11th grade  Occupational History  . Not on file  Tobacco Use  . Smoking status: Never Smoker  . Smokeless  tobacco: Never Used  Substance and Sexual Activity  . Alcohol use: No  . Drug use: No  . Sexual activity: Not on file  Other Topics Concern  . Not on file  Social History Narrative   Pt lives with spouse in 1 story home   She has 4 children   Right handed   Drinks soda 3 times a week no coffee no tea   No regular work outs   Social Determinants of Radio broadcast assistant Strain:   . Difficulty of Paying Living Expenses: Not on file  Food Insecurity:   . Worried About Charity fundraiser in the Last Year: Not on file  . Ran Out of Food in the Last Year: Not on file  Transportation Needs:   . Lack of Transportation (Medical): Not on file  . Lack of Transportation (Non-Medical): Not on file  Physical Activity:   . Days of Exercise per Week: Not on file  . Minutes of Exercise per Session: Not on file  Stress:   . Feeling of Stress : Not on file  Social Connections:   . Frequency of Communication with Friends and Family: Not on file  . Frequency of Social Gatherings with Friends and Family: Not on file  . Attends Religious Services: Not on file  . Active Member of Clubs or Organizations: Not on file  . Attends Archivist Meetings: Not on file  . Marital Status: Not on file  Intimate Partner Violence:   . Fear of Current or  Ex-Partner: Not on file  . Emotionally Abused: Not on file  . Physically Abused: Not on file  . Sexually Abused: Not on file    PHYSICAL EXAM: There were no vitals taken for this visit.  IMPRESSION: 1.  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  Possible spells may be migraine with aura as well 2.  Migraine with aura, without status migrainosus, not intractable 3.  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  Essential thrombocytosis 5.  Hypertension 6.  Type 2 diabetes mellitus 7.  Neurocognitive disorder, vascular  PLAN: 1.  Will get MRI and MRA of brain  2.  We will order NCV-EMG of right upper extremity  Secondary stroke prevention as managed by PCP: 3.  Continue Plavix 75mg  daily for secondary stroke prevention 4.  Continue statin therapy (LDL goal less than 70) 5.  Continue glycemic control (Hgb A1c goal less than 7) 6.  Blood pressure control  7.  Advised no driving 8.  Follow up in 6 months.  Follow Up Instructions:    -I discussed the assessment and treatment plan with the patient. The patient was provided an opportunity to ask questions and all were answered. The patient agreed with the plan and demonstrated an understanding of the instructions.   The patient was advised to call back or seek an in-person evaluation if the symptoms worsen or if the condition fails to improve as anticipated.    Dudley Major, DO

## 2019-10-03 ENCOUNTER — Encounter: Payer: Self-pay | Admitting: Neurology

## 2019-10-03 ENCOUNTER — Telehealth (INDEPENDENT_AMBULATORY_CARE_PROVIDER_SITE_OTHER): Payer: Medicare Other | Admitting: Neurology

## 2019-10-03 ENCOUNTER — Other Ambulatory Visit: Payer: Self-pay

## 2019-10-03 DIAGNOSIS — G43109 Migraine with aura, not intractable, without status migrainosus: Secondary | ICD-10-CM

## 2019-10-03 DIAGNOSIS — G5621 Lesion of ulnar nerve, right upper limb: Secondary | ICD-10-CM | POA: Diagnosis not present

## 2019-10-03 DIAGNOSIS — Z8673 Personal history of transient ischemic attack (TIA), and cerebral infarction without residual deficits: Secondary | ICD-10-CM | POA: Diagnosis not present

## 2019-10-03 DIAGNOSIS — E119 Type 2 diabetes mellitus without complications: Secondary | ICD-10-CM | POA: Diagnosis not present

## 2019-10-03 DIAGNOSIS — I10 Essential (primary) hypertension: Secondary | ICD-10-CM

## 2019-10-03 DIAGNOSIS — R419 Unspecified symptoms and signs involving cognitive functions and awareness: Secondary | ICD-10-CM | POA: Diagnosis not present

## 2019-10-03 DIAGNOSIS — I6789 Other cerebrovascular disease: Secondary | ICD-10-CM

## 2019-10-03 DIAGNOSIS — Z7902 Long term (current) use of antithrombotics/antiplatelets: Secondary | ICD-10-CM | POA: Diagnosis not present

## 2019-10-03 DIAGNOSIS — I6330 Cerebral infarction due to thrombosis of unspecified cerebral artery: Secondary | ICD-10-CM

## 2019-10-03 DIAGNOSIS — D473 Essential (hemorrhagic) thrombocythemia: Secondary | ICD-10-CM | POA: Diagnosis not present

## 2019-10-03 DIAGNOSIS — I639 Cerebral infarction, unspecified: Secondary | ICD-10-CM

## 2019-10-09 ENCOUNTER — Encounter: Payer: Medicare Other | Admitting: Neurology

## 2019-10-10 DIAGNOSIS — D473 Essential (hemorrhagic) thrombocythemia: Secondary | ICD-10-CM | POA: Diagnosis not present

## 2019-12-02 DIAGNOSIS — Z5181 Encounter for therapeutic drug level monitoring: Secondary | ICD-10-CM | POA: Diagnosis not present

## 2019-12-02 DIAGNOSIS — E1165 Type 2 diabetes mellitus with hyperglycemia: Secondary | ICD-10-CM | POA: Diagnosis not present

## 2019-12-02 DIAGNOSIS — Z79899 Other long term (current) drug therapy: Secondary | ICD-10-CM | POA: Diagnosis not present

## 2019-12-02 DIAGNOSIS — E1139 Type 2 diabetes mellitus with other diabetic ophthalmic complication: Secondary | ICD-10-CM | POA: Diagnosis not present

## 2019-12-02 DIAGNOSIS — F411 Generalized anxiety disorder: Secondary | ICD-10-CM | POA: Diagnosis not present

## 2019-12-02 DIAGNOSIS — I10 Essential (primary) hypertension: Secondary | ICD-10-CM | POA: Diagnosis not present

## 2020-01-01 DIAGNOSIS — K862 Cyst of pancreas: Secondary | ICD-10-CM | POA: Diagnosis not present

## 2020-01-01 DIAGNOSIS — E119 Type 2 diabetes mellitus without complications: Secondary | ICD-10-CM | POA: Diagnosis not present

## 2020-01-01 DIAGNOSIS — Z8673 Personal history of transient ischemic attack (TIA), and cerebral infarction without residual deficits: Secondary | ICD-10-CM | POA: Diagnosis not present

## 2020-01-01 DIAGNOSIS — D473 Essential (hemorrhagic) thrombocythemia: Secondary | ICD-10-CM | POA: Diagnosis not present

## 2020-03-03 DIAGNOSIS — F419 Anxiety disorder, unspecified: Secondary | ICD-10-CM | POA: Diagnosis not present

## 2020-03-03 DIAGNOSIS — Z5181 Encounter for therapeutic drug level monitoring: Secondary | ICD-10-CM | POA: Diagnosis not present

## 2020-03-03 DIAGNOSIS — I10 Essential (primary) hypertension: Secondary | ICD-10-CM | POA: Diagnosis not present

## 2020-03-03 DIAGNOSIS — E1165 Type 2 diabetes mellitus with hyperglycemia: Secondary | ICD-10-CM | POA: Diagnosis not present

## 2020-03-03 DIAGNOSIS — Z79899 Other long term (current) drug therapy: Secondary | ICD-10-CM | POA: Diagnosis not present

## 2020-03-03 DIAGNOSIS — E11319 Type 2 diabetes mellitus with unspecified diabetic retinopathy without macular edema: Secondary | ICD-10-CM | POA: Diagnosis not present

## 2020-03-31 DIAGNOSIS — Z808 Family history of malignant neoplasm of other organs or systems: Secondary | ICD-10-CM | POA: Diagnosis not present

## 2020-03-31 DIAGNOSIS — D473 Essential (hemorrhagic) thrombocythemia: Secondary | ICD-10-CM | POA: Diagnosis not present

## 2020-03-31 DIAGNOSIS — R140 Abdominal distension (gaseous): Secondary | ICD-10-CM | POA: Diagnosis not present

## 2020-03-31 DIAGNOSIS — Z8673 Personal history of transient ischemic attack (TIA), and cerebral infarction without residual deficits: Secondary | ICD-10-CM | POA: Diagnosis not present

## 2020-03-31 DIAGNOSIS — R945 Abnormal results of liver function studies: Secondary | ICD-10-CM | POA: Diagnosis not present

## 2020-03-31 DIAGNOSIS — K862 Cyst of pancreas: Secondary | ICD-10-CM | POA: Diagnosis not present

## 2020-04-02 DIAGNOSIS — Z9049 Acquired absence of other specified parts of digestive tract: Secondary | ICD-10-CM | POA: Diagnosis not present

## 2020-04-02 DIAGNOSIS — R935 Abnormal findings on diagnostic imaging of other abdominal regions, including retroperitoneum: Secondary | ICD-10-CM | POA: Diagnosis not present

## 2020-04-02 DIAGNOSIS — K8689 Other specified diseases of pancreas: Secondary | ICD-10-CM | POA: Diagnosis not present

## 2020-04-02 DIAGNOSIS — R140 Abdominal distension (gaseous): Secondary | ICD-10-CM | POA: Diagnosis not present

## 2020-04-02 DIAGNOSIS — K862 Cyst of pancreas: Secondary | ICD-10-CM | POA: Diagnosis not present

## 2020-04-03 NOTE — Progress Notes (Deleted)
NEUROLOGY FOLLOW UP OFFICE NOTE  Allison Stevens [id number]  HISTORY OF PRESENT ILLNESS: Allison A. Baldwinis a 14 year oldright-handed black female with type 2 diabetes mellitus, hypertension, anxiety who follows up for stroke.  UPDATE: Last visit, MRI and MRA of head and NCV-EMG of right upper extremity were again ordered but never performed.  ***  HISTORY: Since late August 2020, she has been feeling unsteady on her felt and weak. She feels like her legs are going to give out when she walks. CT head performed reportedly showed no acute intracranial abnormality. She had a syncopal episode that occurred on 04/15/2019. While brushing her hairand then the next thing she remembers was laying on the floor in the bathroom with EMS and her family talking to her. Her son found her on the floor unconscious. It was suspected that she may have been on the floor for 3 hours. No preceding trigger. When she woke up on the floor, she felt foggy-headed, dizzy, nauseous and lethargic. She noted a headache.No associated facial droop, visual disturbance, or unilateral numbness or weakness.She denies chest pain. She has diabetes and her blood sugars have been between 150 and 250 fasting.She refused admission to the hospital but had another CT head which reportedly showed no acute abnormality.  She reports history of several strokes. Symptoms included left sided weakness with headache and altered speech. She has thrombocytosis and fibromuscular dysplasia. Prior MRI of brain from 2017 showed stable white matter disease and focal chronic lacunar infarcts in the right caudate, which was reportedly new compared to a prior study in 2015. Her episodes include headache, blurred vision, altered speech and left sided weakness and numbness. When severe, she has passed out in the past. They occur when she gets upset, usually once every 2 months. She treats it with Tylenol and laying down on  the bed. For secondary stroke prevention, she has been treated with warfarin, ASA and Plavix. She is no longer on any blood thinners. She says she was taken off of it but not sure why. Headaches previously been treated with Depakote ER 500mg  twice day, which was ineffective. She has residual left sided weakness of arm and leg. She has tried physical therapy.  Carotid ultrasound on 06/03/2019 showed no hemodynamically significant ICA stenosis. She does see hematology at [facility] regarding thrombocytosis. She is being monitored for development of leukemia.   She reports burning pain along the lateral aspect of her right hand and forearm up to the elbow. She notes some associated finger weakness as well.   PAST MEDICAL HISTORY: Past Medical History:  Diagnosis Date  . Acid reflux   . Diabetes ([place]) 2002  . Gait abnormality   . Hypertension   . Migraine   . Stroke ([place])   . Thrombocytosis ([place])     MEDICATIONS: Current Outpatient Medications on File Prior to Visit  Medication Sig Dispense Refill  . ALPRAZolam (XANAX) 0.5 MG tablet Take 0.5 mg by mouth at bedtime as needed for anxiety.    Marland Kitchen amLODipine (NORVASC) 10 MG tablet Take 10 mg by mouth daily.    . clopidogrel (PLAVIX) 75 MG tablet Take 75 mg by mouth daily.    . enalapril (VASOTEC) 10 MG tablet Take 10 mg by mouth 2 (two) times daily.    Marland Kitchen escitalopram (LEXAPRO) 10 MG tablet Take 10 mg by mouth daily.    . hydroxyurea (HYDREA) 500 MG capsule Take 500 mg by mouth 4 (four) times daily. May take with food  to minimize GI side effects.    Marland Kitchen ibuprofen (ADVIL,MOTRIN) 800 MG tablet Take 800 mg by mouth every 8 (eight) hours as needed.    . insulin aspart (NOVOLOG) 100 UNIT/ML injection Inject 34 Units into the skin 3 (three) times daily before meals.    . Insulin Detemir (LEVEMIR) 100 UNIT/ML Pen Inject into the skin.    . Insulin Glargine (TOUJEO MAX SOLOSTAR) 300 UNIT/ML SOPN Inject 72 Units into the skin daily.      . metFORMIN (GLUCOPHAGE) 1000 MG tablet Take 1,000 mg by mouth 2 (two) times daily with a meal.    . montelukast (SINGULAIR) 10 MG tablet Take 10 mg by mouth at bedtime.    . niacin 500 MG tablet Take 500 mg by mouth at bedtime.    Marland Kitchen oxycodone (OXY-IR) 5 MG capsule Take 10 mg by mouth every 4 (four) hours as needed.    Marland Kitchen oxyCODONE (OXYCONTIN) 40 mg 12 hr tablet Take 40 mg by mouth every 12 (twelve) hours.    . pantoprazole (PROTONIX) 40 MG tablet Take 1 tablet (40 mg total) by mouth daily. 30 tablet 6  . pravastatin (PRAVACHOL) 40 MG tablet Take 40 mg by mouth daily.    . prochlorperazine (COMPAZINE) 10 MG tablet Take 10 mg by mouth every 6 (six) hours as needed for nausea or vomiting.    . TRULICITY 3 VQ/2.5ZD SOPN      No current facility-administered medications on file prior to visit.    ALLERGIES: Allergies  Allergen Reactions  . Iodinated Diagnostic Agents Other (See Comments) and Shortness Of Breath    FAMILY HISTORY: Family History  Problem Relation Age of Onset  . Diabetes Mother   . Cancer Mother   . Cancer Father   . Healthy Child    ***.  SOCIAL HISTORY: Social History   Socioeconomic History  . Marital status: Married    Spouse name: Not on file  . Number of children: 4  . Years of education: Not on file  . Highest education level: 11th grade  Occupational History  . Not on file  Tobacco Use  . Smoking status: Never Smoker  . Smokeless tobacco: Never Used  Vaping Use  . Vaping Use: Never used  Substance and Sexual Activity  . Alcohol use: No  . Drug use: No  . Sexual activity: Not on file  Other Topics Concern  . Not on file  Social History Narrative   Pt lives with spouse in 1 story home   She has 4 children   Right handed   Drinks soda 3 times a week no coffee no tea   No regular work outs   Social Determinants of Radio broadcast assistant Strain:   . Difficulty of Paying Living Expenses: Not on file  Food Insecurity:   . Worried About  Charity fundraiser in the Last Year: Not on file  . Ran Out of Food in the Last Year: Not on file  Transportation Needs:   . Lack of Transportation (Medical): Not on file  . Lack of Transportation (Non-Medical): Not on file  Physical Activity:   . Days of Exercise per Week: Not on file  . Minutes of Exercise per Session: Not on file  Stress:   . Feeling of Stress : Not on file  Social Connections:   . Frequency of Communication with Friends and Family: Not on file  . Frequency of Social Gatherings with Friends and Family: Not on file  .  Attends Religious Services: Not on file  . Active Member of Clubs or Organizations: Not on file  . Attends Archivist Meetings: Not on file  . Marital Status: Not on file  Intimate Partner Violence:   . Fear of Current or Ex-Partner: Not on file  . Emotionally Abused: Not on file  . Physically Abused: Not on file  . Sexually Abused: Not on file   PHYSICAL EXAM: *** General: No acute distress.  Patient appears well-groomed.   Head:  Normocephalic/atraumatic Eyes:  Fundi examined but not visualized Neck: supple, no paraspinal tenderness, full range of motion Heart:  Regular rate and rhythm Lungs:  Clear to auscultation bilaterally Back: No paraspinal tenderness Neurological Exam: alert and oriented to person, place, and time. Attention span and concentration intact, recent and remote memory intact, fund of knowledge intact.  Speech fluent and not dysarthric, language intact.  CN II-XII intact. Bulk and tone normal, muscle strength 5/5 throughout.  Sensation to light touch, temperature and vibration intact.  Deep tendon reflexes 2+ throughout, toes downgoing.  Finger to nose and heel to shin testing intact.  Gait normal, Romberg negative.  IMPRESSION: 1.  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  Possible spells may be migraine with aura as well. 2.  Migraine with aura, without status  migrainosus, not intractable 3.  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  Essential thrombocytosis 5.  Hypertension 6.  Type 2 diabetes mellitus 7.   Vascular mild neurocognitive disorder  PLAN: 1.  Secondary stroke prevention as managed by PCP:  - Plavix 75mg  daily  -  Statin therapy.  LDL goal less than 70  -  Blood pressure control  -  Glycemic control. Hgb A1c goal less than 7. 2.  ***  Metta Clines, DO  CC:  Townsend Roger, MD

## 2020-04-07 ENCOUNTER — Ambulatory Visit: Payer: Medicare Other | Admitting: Neurology

## 2020-05-29 DIAGNOSIS — Z5181 Encounter for therapeutic drug level monitoring: Secondary | ICD-10-CM | POA: Diagnosis not present

## 2020-05-29 DIAGNOSIS — E1165 Type 2 diabetes mellitus with hyperglycemia: Secondary | ICD-10-CM | POA: Diagnosis not present

## 2020-05-29 DIAGNOSIS — D693 Immune thrombocytopenic purpura: Secondary | ICD-10-CM | POA: Diagnosis not present

## 2020-05-29 DIAGNOSIS — E782 Mixed hyperlipidemia: Secondary | ICD-10-CM | POA: Diagnosis not present

## 2020-05-29 DIAGNOSIS — Z79899 Other long term (current) drug therapy: Secondary | ICD-10-CM | POA: Diagnosis not present

## 2020-05-29 DIAGNOSIS — Z1389 Encounter for screening for other disorder: Secondary | ICD-10-CM | POA: Diagnosis not present

## 2020-05-29 DIAGNOSIS — Z0000 Encounter for general adult medical examination without abnormal findings: Secondary | ICD-10-CM | POA: Diagnosis not present

## 2020-05-29 DIAGNOSIS — E11319 Type 2 diabetes mellitus with unspecified diabetic retinopathy without macular edema: Secondary | ICD-10-CM | POA: Diagnosis not present

## 2020-05-29 DIAGNOSIS — E119 Type 2 diabetes mellitus without complications: Secondary | ICD-10-CM | POA: Diagnosis not present

## 2020-05-29 DIAGNOSIS — I6930 Unspecified sequelae of cerebral infarction: Secondary | ICD-10-CM | POA: Diagnosis not present

## 2020-05-29 DIAGNOSIS — I10 Essential (primary) hypertension: Secondary | ICD-10-CM | POA: Diagnosis not present

## 2020-05-29 DIAGNOSIS — E1169 Type 2 diabetes mellitus with other specified complication: Secondary | ICD-10-CM | POA: Diagnosis not present

## 2020-05-29 DIAGNOSIS — Z6825 Body mass index (BMI) 25.0-25.9, adult: Secondary | ICD-10-CM | POA: Diagnosis not present

## 2020-06-16 DIAGNOSIS — J029 Acute pharyngitis, unspecified: Secondary | ICD-10-CM | POA: Diagnosis not present

## 2020-06-16 DIAGNOSIS — I10 Essential (primary) hypertension: Secondary | ICD-10-CM | POA: Diagnosis not present

## 2020-06-24 ENCOUNTER — Other Ambulatory Visit: Payer: Self-pay

## 2020-06-24 DIAGNOSIS — M898X9 Other specified disorders of bone, unspecified site: Secondary | ICD-10-CM

## 2020-06-24 DIAGNOSIS — R161 Splenomegaly, not elsewhere classified: Secondary | ICD-10-CM

## 2020-06-24 DIAGNOSIS — D473 Essential (hemorrhagic) thrombocythemia: Secondary | ICD-10-CM

## 2020-06-24 MED ORDER — OXYCODONE HCL ER 40 MG PO T12A: 40.0000 mg | EXTENDED_RELEASE_TABLET | Freq: Two times a day (BID) | ORAL | 0 refills | Status: DC

## 2020-06-24 MED ORDER — OXYCODONE HCL 10 MG PO TABS: 10.0000 mg | ORAL_TABLET | ORAL | 0 refills | Status: DC | PRN

## 2020-06-28 ENCOUNTER — Other Ambulatory Visit: Payer: Self-pay | Admitting: Oncology

## 2020-06-28 DIAGNOSIS — D473 Essential (hemorrhagic) thrombocythemia: Secondary | ICD-10-CM

## 2020-06-29 ENCOUNTER — Inpatient Hospital Stay: Payer: Medicare Other | Admitting: Oncology

## 2020-06-29 ENCOUNTER — Inpatient Hospital Stay: Payer: Medicare Other | Attending: Oncology

## 2020-07-08 ENCOUNTER — Other Ambulatory Visit: Payer: Self-pay | Admitting: Hematology and Oncology

## 2020-07-08 DIAGNOSIS — D473 Essential (hemorrhagic) thrombocythemia: Secondary | ICD-10-CM

## 2020-07-17 ENCOUNTER — Telehealth: Payer: Self-pay | Admitting: Hematology and Oncology

## 2020-07-17 ENCOUNTER — Inpatient Hospital Stay: Payer: Medicare Other | Attending: Oncology | Admitting: Hematology and Oncology

## 2020-07-17 ENCOUNTER — Other Ambulatory Visit: Payer: Self-pay | Admitting: Hematology and Oncology

## 2020-07-17 ENCOUNTER — Inpatient Hospital Stay: Payer: Medicare Other

## 2020-07-17 ENCOUNTER — Other Ambulatory Visit: Payer: Self-pay

## 2020-07-17 VITALS — BP 192/86 | HR 86 | Temp 98.4°F | Resp 18 | Ht 63.5 in | Wt 148.2 lb

## 2020-07-17 DIAGNOSIS — D473 Essential (hemorrhagic) thrombocythemia: Secondary | ICD-10-CM

## 2020-07-17 DIAGNOSIS — D649 Anemia, unspecified: Secondary | ICD-10-CM | POA: Diagnosis not present

## 2020-07-17 DIAGNOSIS — Z0001 Encounter for general adult medical examination with abnormal findings: Secondary | ICD-10-CM | POA: Diagnosis not present

## 2020-07-17 LAB — HEPATIC FUNCTION PANEL
ALT: 49 — AB (ref 7–35)
AST: 39 — AB (ref 13–35)
Alkaline Phosphatase: 82 (ref 25–125)
Bilirubin, Total: 0.5

## 2020-07-17 LAB — CBC AND DIFFERENTIAL
HCT: 32 — AB (ref 36–46)
Hemoglobin: 10.7 — AB (ref 12.0–16.0)
Neutrophils Absolute: 3.46
Platelets: 455 — AB (ref 150–399)
WBC: 5.4

## 2020-07-17 LAB — BASIC METABOLIC PANEL
BUN: 10 (ref 4–21)
CO2: 29 — AB (ref 13–22)
Chloride: 107 (ref 99–108)
Creatinine: 0.7 (ref 0.5–1.1)
Glucose: 105
Potassium: 3.6 (ref 3.4–5.3)
Sodium: 142 (ref 137–147)

## 2020-07-17 LAB — COMPREHENSIVE METABOLIC PANEL
Albumin: 4.1 (ref 3.5–5.0)
Calcium: 9.4 (ref 8.7–10.7)

## 2020-07-17 LAB — CBC: RBC: 3.53 — AB (ref 3.87–5.11)

## 2020-07-17 MED ORDER — CLOPIDOGREL BISULFATE 75 MG PO TABS: 75.0000 mg | ORAL_TABLET | Freq: Every day | ORAL | 0 refills | Status: DC

## 2020-07-17 NOTE — Telephone Encounter (Signed)
Per 12/17 LOS, patient scheduled for April 2022 Appt's.  Gave patient Appt Summary

## 2020-07-17 NOTE — Progress Notes (Signed)
[facility]  [address] [place],  [place]  [address] [phone]  Clinic Day:  07/17/2020  Referring physician: Townsend Roger, MD   CHIEF COMPLAINT:  CC: Essential thrombocythemia  Current Treatment:  Hydroxyurea 500 mg 4 times daily   HISTORY OF PRESENT ILLNESS:  Allison Stevens is a 54 y.o. 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  This has been treated with hydroxyurea, but the patient has often been non-compliant.  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  She has persistent pain in the left upper quadrant, attributed to splenomegaly, as well as bone pain, for which she remains on OxyContin with oxycodone for breakthrough.  We tapered her opioids down as much as possible over time.  She remains on 40 mg every 12 hours with oxycodone 10 mg every 4 hours as needed for breakthrough pain.  She saw Dr. Lacinda Axon, her gastroenterologist at [facility], and underwent an esophageal dilatation in August 2016.  She had already had a colonoscopy due to previous gastrointestinal complaints.  We have been following a lesion in the pancreas seen incidentally on CT done in the emergency room for abdominal pain.  This has been felt to be a benign cyst.  Repeat MRI abdomen in November 2016, revealed a slight increase in the benign appearing cyst of the pancreas.  No splenomegaly was seen.  Repeat MRI abdomen in 1 year was recommended.  She was hospitalized in September 2017 and underwent colonoscopy with removal of a small polyp in the sigmoid colon.  Small internal hemorrhoids were also seen.  Dr. Lyndel Safe also obtained a CEA and CA 19-9, and. the CA 19-9 was elevated at 145, the CEA was normal.  Repeat MRI abdomen with contrast in September 2017 after discharge from the hospital revealed a stable 14 mm cystic lesion in the pancreas.  Again, no splenomegaly was seen.  Follow-up MRI in 1 year was recommended.   Repeat MRI abdomen was done in March 2019 and revealed a 17 mm cystic lesion in the pancreatic uncinate process, which was felt to be stable.  No significant liver lesions were seen.  Repeat MRI abdomen in 1 year was recommended.  Repeat MRI abdomen in September 2019 revealed a stable 19 mm cystic lesion of the pancreas, which appeared to be benign and still felt to be stable.  Repeat MRI abdomen with and without contrast in 1 year was recommended.  MRI abdomen in October 2020 revealed a stable 17 mm cystic lesion of the pancreas.  Continued yearly follow up MRI abdomen was recommended.  Screening mammogram in February 2016 revealed a possible mass in the right breast.  Right diagnostic mammogram and ultrasound was done and revealed 2 well circumscribed hypoechoic lesions in the right breast at 11 o 'clock, felt to represent cysts.  Repeat mammogram and ultrasound in August revealed heterogeneous fibroglandular tissue felt to be benign, with resolution of the previously seen cysts and no masses.  Repeat diagnostic bilateral mammogram and ultrasound in 6 months was once again recommended.  Bilateral diagnostic mammogram in March 2017 did not reveal any evidence of malignancy, so 1 year follow-up screening mammogram was recommended.  Bilateral screening mammogram in March 2018 did not reveal any evidence of malignancy.  Bone density scan in June 2018 revealed osteopenia with a T-score of -1.5 in the femur.  The patient was instructed to take calcium and vitamin-D twice daily.  The patient's family history is  significant in that her father had pancreatic cancer at age 43, a paternal uncle had pancreatic cancer and a paternal aunt had pancreatic cancer.  Her brother also had pancreatic cancer at age 7.  She is appropriate for genetic testing, but this has not been done yet due to insurance coverage.  Bilateral screening mammogram in December 2020 did not reveal any evidence of malignancy.  At her visit in June 2020,  we tried decreasing her OxyContin 30 mg every 12 hours, but then increased back to 40 mg every 12 hours, as her pain was not controlled.  She has since continued the same narcotic regimen.  Repeat MRI abdomen in September revealed a 1.9 cm cystic lesion in the pancreatic uncinate process, which showed no significant change compared to more recent exams, but has shown a slow increase in size since 2016. 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 Recommend continued follow-up by MRI in 2 years   INTERVAL HISTORY:  Allison Stevens is here today for routine follow up and states she continues hydroxyurea 500 mg 4 times daily.  She continues to have bone pain. She also has chronic headaches, but reports a headache today which she rates as 6/10 today. She feels her left arm strength is not as good as previous.  She denies other neurologic symptoms such as vision changes, nausea or vomiting or paresthesias. She continues OxyContin 40 mg every 12 hours with oxycodone 10 mg every 4 hours as needed.  She denies fevers or chills. She  denies pain. Her appetite is good. Her weight has been stable.  She states she has been out of her Plavix for some time now.  She states that she continues her blood pressure medications.   She checks her blood pressure at home and it has been running higher than normal for the past week. She is not scheduled to see her primary care provider until January.  REVIEW OF SYSTEMS:  Review of Systems  Constitutional: Positive for fatigue. Negative for appetite change, chills, fever and unexpected weight change.  HENT:   Negative for lump/mass, mouth sores and sore throat.   Respiratory: Negative for cough and shortness of breath.   Cardiovascular: Negative for chest pain and leg swelling.  Gastrointestinal: Negative for abdominal pain, constipation, diarrhea, nausea and vomiting.  Genitourinary: Negative for difficulty  urinating, dysuria, frequency and hematuria.   Musculoskeletal: Positive for arthralgias.  Skin: Negative for rash.  Neurological: Positive for extremity weakness and headaches. Negative for dizziness.  Psychiatric/Behavioral: Negative for depression. The patient is not nervous/anxious.      VITALS:  Blood pressure (!) 192/86, pulse 86, temperature 98.4 F (36.9 C), temperature source Oral, resp. rate 18, height 5' 3.5" (1.613 m), weight 148 lb 3.2 oz (67.2 kg), SpO2 95 %.  Wt Readings from Last 3 Encounters:  07/17/20 148 lb 3.2 oz (67.2 kg)  05/28/19 131 lb 6.4 oz (59.6 kg)  12/27/16 141 lb 3.2 oz (64 kg)    Body mass index is 25.84 kg/m.  Performance status (ECOG): 1 - Symptomatic but completely ambulatory  PHYSICAL EXAM:  Physical Exam Vitals and nursing note reviewed.  Constitutional:      General: She is not in acute distress.    Appearance: Normal appearance. She is normal weight.  HENT:     Mouth/Throat:     Mouth: Mucous membranes are moist.     Pharynx: Oropharynx is clear. No oropharyngeal exudate or posterior oropharyngeal erythema.  Eyes:     General: No scleral icterus.    Extraocular Movements: Extraocular movements intact.     Conjunctiva/sclera: Conjunctivae normal.     Pupils: Pupils are equal, round, and reactive to light.  Cardiovascular:     Rate and Rhythm: Normal rate and regular rhythm.     Heart sounds: Normal heart sounds. No murmur heard. No friction rub. No gallop.   Pulmonary:     Effort: No respiratory distress.     Breath sounds: Normal breath sounds. No stridor. No wheezing, rhonchi or rales.  Chest:  Breasts:     Right: No axillary adenopathy or supraclavicular adenopathy.     Left: No axillary adenopathy or supraclavicular adenopathy.    Abdominal:     General: There is no distension.     Palpations: Abdomen is soft. There is no hepatomegaly, splenomegaly or mass.     Tenderness: There is no abdominal tenderness. There is no  guarding.     Hernia: No hernia is present.  Musculoskeletal:     Cervical back: Neck supple. No tenderness.     Right lower leg: No edema.     Left lower leg: No edema.  Lymphadenopathy:     Cervical: No cervical adenopathy.     Upper Body:     Right upper body: No supraclavicular or axillary adenopathy.     Left upper body: No supraclavicular or axillary adenopathy.     Lower Body: No right inguinal adenopathy. No left inguinal adenopathy.  Skin:    General: Skin is warm.     Coloration: Skin is not jaundiced.     Findings: No rash.  Neurological:     General: No focal deficit present.     Mental Status: She is alert and oriented to person, place, and time.     Cranial Nerves: Cranial nerves are intact. No cranial nerve deficit, dysarthria or facial asymmetry.     Sensory: Sensation is intact. No sensory deficit.     Motor: Motor function is intact. No weakness.     Coordination: Coordination is intact. Coordination normal. Finger-Nose-Finger Test normal. Rapid alternating movements normal.  Psychiatric:        Mood and Affect: Mood normal.        Behavior: Behavior normal.    LABS:   CBC Latest Ref Rng & Units 07/17/2020  WBC - 5.4  Hemoglobin 12.0 - 16.0 10.7(A)  Hematocrit 36 - 46 32(A)  Platelets 150 - 399 455(A)   CMP Latest Ref Rng & Units 07/17/2020  BUN 4 - 21 10  Creatinine 0.5 - 1.1 0.7  Sodium 137 - 147 142  Potassium 3.4 - 5.3 3.6  Chloride 99 - 108 107  CO2 13 - 22 29(A)  Calcium 8.7 - 10.7 9.4  Alkaline Phos 25 - 125 82  AST 13 - 35 39(A)  ALT 7 - 35 49(A)     No results found for: CEA1 / No results found for: CEA1 No results found for: PSA1 No results found for: EQA834 No results found for: HDQ222  No results found for: TOTALPROTELP, ALBUMINELP, A1GS, A2GS, BETS, BETA2SER, GAMS, MSPIKE, SPEI No results found for: TIBC, FERRITIN, IRONPCTSAT No results found for: LDH  STUDIES:  No results found.    HISTORY:   Past Medical History:   Diagnosis Date  . Acid reflux   . Diabetes ([place]) 2002  . Gait abnormality   . Hypertension   . Migraine   . Stroke ([place])   .  Thrombocytosis ([place])     Past Surgical History:  Procedure Laterality Date  . CESAREAN SECTION  1997  . GALLBLADDER SURGERY  2011    Family History  Problem Relation Age of Onset  . Diabetes Mother   . Cancer Mother   . Cancer Father   . Healthy Child     Social History:  reports that she has never smoked. She has never used smokeless tobacco. She reports that she does not drink alcohol and does not use drugs.The patient is alone today.  Allergies:  Allergies  Allergen Reactions  . Iodinated Diagnostic Agents Other (See Comments) and Shortness Of Breath    Current Medications: Current Outpatient Medications  Medication Sig Dispense Refill  . ALPRAZolam (XANAX) 0.5 MG tablet Take 0.5 mg by mouth at bedtime as needed for anxiety.    Marland Kitchen amLODipine (NORVASC) 10 MG tablet Take 10 mg by mouth daily.    . clopidogrel (PLAVIX) 75 MG tablet Take 75 mg by mouth daily.    . enalapril (VASOTEC) 10 MG tablet Take 10 mg by mouth 2 (two) times daily.    Marland Kitchen escitalopram (LEXAPRO) 10 MG tablet Take 10 mg by mouth daily.    . hydroxyurea (HYDREA) 500 MG capsule TAKE 1 CAPSULE BY MOUTH FOUR TIMES A DAY 360 capsule 1  . ibuprofen (ADVIL,MOTRIN) 800 MG tablet Take 800 mg by mouth every 8 (eight) hours as needed.    . insulin aspart (NOVOLOG) 100 UNIT/ML injection Inject 34 Units into the skin 3 (three) times daily before meals.    . Insulin Detemir (LEVEMIR) 100 UNIT/ML Pen Inject into the skin.    . Insulin Glargine (TOUJEO MAX SOLOSTAR) 300 UNIT/ML SOPN Inject 72 Units into the skin daily.    . metFORMIN (GLUCOPHAGE) 1000 MG tablet Take 1,000 mg by mouth 2 (two) times daily with a meal.    . montelukast (SINGULAIR) 10 MG tablet Take 10 mg by mouth at bedtime.    . niacin 500 MG tablet Take 500 mg by mouth at bedtime.    Marland Kitchen oxycodone (OXY-IR) 5 MG capsule Take 10 mg  by mouth every 4 (four) hours as needed.    Marland Kitchen oxyCODONE (OXYCONTIN) 40 mg 12 hr tablet Take 1 tablet (40 mg total) by mouth every 12 (twelve) hours. 60 tablet 0  . Oxycodone HCl 10 MG TABS Take 1 tablet (10 mg total) by mouth every 4 (four) hours as needed (pain). 120 tablet 0  . pantoprazole (PROTONIX) 40 MG tablet Take 1 tablet (40 mg total) by mouth daily. 30 tablet 6  . pravastatin (PRAVACHOL) 40 MG tablet Take 40 mg by mouth daily.    . prochlorperazine (COMPAZINE) 10 MG tablet Take 10 mg by mouth every 6 (six) hours as needed for nausea or vomiting.    . TRULICITY 3 JS/2.8BT SOPN      No current facility-administered medications for this visit.     ASSESSMENT & PLAN:   Assessment:  1. Essential thrombocytosis, well controlled with hydroxyurea 500 mg 4 times daily.  She is on chronic opioids due to bone pain.   2. Prior strokes due to thrombocytosis.  I will give her a 30 day supply Plavix today. 3. Cystic lesion of the pancreas, for which she has been undergoing annual MRI abdomen, which is due in October. 4. Diabetes, for which she sees Dr. Nona Dell.  5. Elevated liver transaminases of uncertain etiology, these have fluctuated up and down and have once again improved.  6. History of pancreatic cyst, which we are monitoring. 7. First-degree relatives with pancreatic cancer.  Genetic testing has been recommended, but not done due to lack of insurance coverage. Her brother is currently being treated for prostate cancer and I will discuss genetic testing with him. 8. Uncontrolled hypertension .  I recommended she see her primary care provider as soon as possible and she assured me she would call for an appointment.    Plan: She knows to come to the emergency room if her headache worsens, she has other neurologic symptoms or her blood pressure is higher. She knows to continue hydroxyurea 4 times daily.  We will continue OxyContin 40 mg every 12 hours and oxycodone 10 mg every 4 hours as  needed for breakthrough pain.  As above, she will see her primary care provider soon as possible regarding her uncontrolled hypertension.  We will see her back in 3 months with CBC and comprehensive metabolic profile for continue follow-up.  The patient understands the plans discussed today and is in agreement with them.  She knows to contact our office if she develops concerns prior to her next appointment.    [name], PA-C

## 2020-07-20 ENCOUNTER — Other Ambulatory Visit: Payer: Self-pay

## 2020-07-20 ENCOUNTER — Other Ambulatory Visit: Payer: Self-pay | Admitting: Hematology and Oncology

## 2020-07-20 DIAGNOSIS — R161 Splenomegaly, not elsewhere classified: Secondary | ICD-10-CM

## 2020-07-20 DIAGNOSIS — M898X9 Other specified disorders of bone, unspecified site: Secondary | ICD-10-CM

## 2020-07-20 MED ORDER — OXYCODONE HCL 10 MG PO TABS: 10.0000 mg | ORAL_TABLET | ORAL | 0 refills | Status: DC | PRN

## 2020-07-20 MED ORDER — OXYCODONE HCL ER 40 MG PO T12A: 40.0000 mg | EXTENDED_RELEASE_TABLET | Freq: Two times a day (BID) | ORAL | 0 refills | Status: DC

## 2020-07-21 ENCOUNTER — Encounter: Payer: Self-pay | Admitting: Hematology and Oncology

## 2020-08-10 DIAGNOSIS — R109 Unspecified abdominal pain: Secondary | ICD-10-CM | POA: Diagnosis not present

## 2020-08-10 DIAGNOSIS — M545 Low back pain, unspecified: Secondary | ICD-10-CM | POA: Diagnosis not present

## 2020-08-14 DIAGNOSIS — M25551 Pain in right hip: Secondary | ICD-10-CM | POA: Diagnosis not present

## 2020-08-19 DIAGNOSIS — R1110 Vomiting, unspecified: Secondary | ICD-10-CM | POA: Diagnosis not present

## 2020-08-19 DIAGNOSIS — E860 Dehydration: Secondary | ICD-10-CM | POA: Diagnosis not present

## 2020-08-19 DIAGNOSIS — K760 Fatty (change of) liver, not elsewhere classified: Secondary | ICD-10-CM | POA: Diagnosis not present

## 2020-08-19 DIAGNOSIS — E1110 Type 2 diabetes mellitus with ketoacidosis without coma: Secondary | ICD-10-CM | POA: Diagnosis not present

## 2020-08-19 DIAGNOSIS — R109 Unspecified abdominal pain: Secondary | ICD-10-CM | POA: Diagnosis not present

## 2020-08-19 DIAGNOSIS — R1012 Left upper quadrant pain: Secondary | ICD-10-CM | POA: Diagnosis not present

## 2020-08-19 DIAGNOSIS — R112 Nausea with vomiting, unspecified: Secondary | ICD-10-CM | POA: Diagnosis not present

## 2020-08-20 DIAGNOSIS — K760 Fatty (change of) liver, not elsewhere classified: Secondary | ICD-10-CM | POA: Diagnosis not present

## 2020-08-20 DIAGNOSIS — Z8719 Personal history of other diseases of the digestive system: Secondary | ICD-10-CM | POA: Diagnosis not present

## 2020-08-20 DIAGNOSIS — Z8673 Personal history of transient ischemic attack (TIA), and cerebral infarction without residual deficits: Secondary | ICD-10-CM | POA: Diagnosis not present

## 2020-08-20 DIAGNOSIS — K219 Gastro-esophageal reflux disease without esophagitis: Secondary | ICD-10-CM | POA: Diagnosis present

## 2020-08-20 DIAGNOSIS — Z7902 Long term (current) use of antithrombotics/antiplatelets: Secondary | ICD-10-CM | POA: Diagnosis not present

## 2020-08-20 DIAGNOSIS — D473 Essential (hemorrhagic) thrombocythemia: Secondary | ICD-10-CM | POA: Diagnosis present

## 2020-08-20 DIAGNOSIS — Z794 Long term (current) use of insulin: Secondary | ICD-10-CM | POA: Diagnosis not present

## 2020-08-20 DIAGNOSIS — G8929 Other chronic pain: Secondary | ICD-10-CM | POA: Diagnosis present

## 2020-08-20 DIAGNOSIS — B349 Viral infection, unspecified: Secondary | ICD-10-CM | POA: Diagnosis not present

## 2020-08-20 DIAGNOSIS — R7401 Elevation of levels of liver transaminase levels: Secondary | ICD-10-CM | POA: Diagnosis present

## 2020-08-20 DIAGNOSIS — E876 Hypokalemia: Secondary | ICD-10-CM | POA: Diagnosis present

## 2020-08-20 DIAGNOSIS — F418 Other specified anxiety disorders: Secondary | ICD-10-CM | POA: Diagnosis present

## 2020-08-20 DIAGNOSIS — E1110 Type 2 diabetes mellitus with ketoacidosis without coma: Secondary | ICD-10-CM | POA: Diagnosis not present

## 2020-08-20 DIAGNOSIS — Z79899 Other long term (current) drug therapy: Secondary | ICD-10-CM | POA: Diagnosis not present

## 2020-08-20 DIAGNOSIS — R109 Unspecified abdominal pain: Secondary | ICD-10-CM | POA: Diagnosis not present

## 2020-08-20 DIAGNOSIS — R0902 Hypoxemia: Secondary | ICD-10-CM | POA: Diagnosis present

## 2020-08-20 DIAGNOSIS — R1110 Vomiting, unspecified: Secondary | ICD-10-CM | POA: Diagnosis not present

## 2020-08-20 DIAGNOSIS — K862 Cyst of pancreas: Secondary | ICD-10-CM | POA: Diagnosis present

## 2020-08-20 DIAGNOSIS — R1012 Left upper quadrant pain: Secondary | ICD-10-CM | POA: Diagnosis not present

## 2020-08-20 DIAGNOSIS — R0989 Other specified symptoms and signs involving the circulatory and respiratory systems: Secondary | ICD-10-CM | POA: Diagnosis present

## 2020-08-20 DIAGNOSIS — E860 Dehydration: Secondary | ICD-10-CM | POA: Diagnosis not present

## 2020-08-20 DIAGNOSIS — R112 Nausea with vomiting, unspecified: Secondary | ICD-10-CM | POA: Diagnosis present

## 2020-08-20 DIAGNOSIS — Z79891 Long term (current) use of opiate analgesic: Secondary | ICD-10-CM | POA: Diagnosis not present

## 2020-08-20 DIAGNOSIS — E1165 Type 2 diabetes mellitus with hyperglycemia: Secondary | ICD-10-CM | POA: Diagnosis present

## 2020-08-20 DIAGNOSIS — E118 Type 2 diabetes mellitus with unspecified complications: Secondary | ICD-10-CM | POA: Diagnosis present

## 2020-08-24 ENCOUNTER — Other Ambulatory Visit: Payer: Self-pay

## 2020-08-24 DIAGNOSIS — R161 Splenomegaly, not elsewhere classified: Secondary | ICD-10-CM

## 2020-08-24 DIAGNOSIS — M898X9 Other specified disorders of bone, unspecified site: Secondary | ICD-10-CM

## 2020-08-24 MED ORDER — OXYCODONE HCL ER 40 MG PO T12A: 40.0000 mg | EXTENDED_RELEASE_TABLET | Freq: Two times a day (BID) | ORAL | 0 refills | Status: DC

## 2020-08-24 MED ORDER — OXYCODONE HCL 10 MG PO TABS: 10.0000 mg | ORAL_TABLET | ORAL | 0 refills | Status: DC | PRN

## 2020-08-25 DIAGNOSIS — R112 Nausea with vomiting, unspecified: Secondary | ICD-10-CM | POA: Diagnosis not present

## 2020-08-25 DIAGNOSIS — R1012 Left upper quadrant pain: Secondary | ICD-10-CM | POA: Diagnosis not present

## 2020-08-25 DIAGNOSIS — E860 Dehydration: Secondary | ICD-10-CM | POA: Diagnosis not present

## 2020-08-25 DIAGNOSIS — I10 Essential (primary) hypertension: Secondary | ICD-10-CM | POA: Diagnosis not present

## 2020-08-25 DIAGNOSIS — E876 Hypokalemia: Secondary | ICD-10-CM | POA: Diagnosis not present

## 2020-08-25 DIAGNOSIS — K862 Cyst of pancreas: Secondary | ICD-10-CM | POA: Diagnosis not present

## 2020-08-25 DIAGNOSIS — I959 Hypotension, unspecified: Secondary | ICD-10-CM | POA: Diagnosis not present

## 2020-09-08 DIAGNOSIS — I10 Essential (primary) hypertension: Secondary | ICD-10-CM | POA: Diagnosis not present

## 2020-09-08 DIAGNOSIS — Z79899 Other long term (current) drug therapy: Secondary | ICD-10-CM | POA: Diagnosis not present

## 2020-09-08 DIAGNOSIS — E119 Type 2 diabetes mellitus without complications: Secondary | ICD-10-CM | POA: Diagnosis not present

## 2020-09-08 DIAGNOSIS — Z5181 Encounter for therapeutic drug level monitoring: Secondary | ICD-10-CM | POA: Diagnosis not present

## 2020-09-22 ENCOUNTER — Other Ambulatory Visit: Payer: Self-pay

## 2020-09-22 DIAGNOSIS — M898X9 Other specified disorders of bone, unspecified site: Secondary | ICD-10-CM

## 2020-09-22 DIAGNOSIS — R161 Splenomegaly, not elsewhere classified: Secondary | ICD-10-CM

## 2020-09-22 MED ORDER — OXYCODONE HCL ER 40 MG PO T12A: 40.0000 mg | EXTENDED_RELEASE_TABLET | Freq: Two times a day (BID) | ORAL | 0 refills | Status: DC

## 2020-09-22 MED ORDER — OXYCODONE HCL 10 MG PO TABS: 10.0000 mg | ORAL_TABLET | ORAL | 0 refills | Status: DC | PRN

## 2020-10-20 ENCOUNTER — Other Ambulatory Visit: Payer: Self-pay

## 2020-10-20 DIAGNOSIS — R161 Splenomegaly, not elsewhere classified: Secondary | ICD-10-CM

## 2020-10-20 DIAGNOSIS — M898X9 Other specified disorders of bone, unspecified site: Secondary | ICD-10-CM

## 2020-10-20 MED ORDER — OXYCODONE HCL 10 MG PO TABS: 10.0000 mg | ORAL_TABLET | ORAL | 0 refills | Status: DC | PRN

## 2020-10-20 MED ORDER — OXYCODONE HCL ER 40 MG PO T12A: 40.0000 mg | EXTENDED_RELEASE_TABLET | Freq: Two times a day (BID) | ORAL | 0 refills | Status: DC

## 2020-11-10 DIAGNOSIS — M7989 Other specified soft tissue disorders: Secondary | ICD-10-CM | POA: Diagnosis not present

## 2020-11-10 DIAGNOSIS — R296 Repeated falls: Secondary | ICD-10-CM | POA: Diagnosis not present

## 2020-11-10 DIAGNOSIS — Z5181 Encounter for therapeutic drug level monitoring: Secondary | ICD-10-CM | POA: Diagnosis not present

## 2020-11-10 DIAGNOSIS — R300 Dysuria: Secondary | ICD-10-CM | POA: Diagnosis not present

## 2020-11-10 DIAGNOSIS — Z79899 Other long term (current) drug therapy: Secondary | ICD-10-CM | POA: Diagnosis not present

## 2020-11-13 ENCOUNTER — Other Ambulatory Visit: Payer: Self-pay | Admitting: Hematology and Oncology

## 2020-11-13 DIAGNOSIS — M65332 Trigger finger, left middle finger: Secondary | ICD-10-CM | POA: Diagnosis not present

## 2020-11-13 DIAGNOSIS — M65331 Trigger finger, right middle finger: Secondary | ICD-10-CM | POA: Diagnosis not present

## 2020-11-13 DIAGNOSIS — M24541 Contracture, right hand: Secondary | ICD-10-CM | POA: Diagnosis not present

## 2020-11-13 DIAGNOSIS — M24542 Contracture, left hand: Secondary | ICD-10-CM | POA: Diagnosis not present

## 2020-11-13 DIAGNOSIS — M25441 Effusion, right hand: Secondary | ICD-10-CM | POA: Diagnosis not present

## 2020-11-13 DIAGNOSIS — M6281 Muscle weakness (generalized): Secondary | ICD-10-CM | POA: Diagnosis not present

## 2020-11-13 DIAGNOSIS — D473 Essential (hemorrhagic) thrombocythemia: Secondary | ICD-10-CM

## 2020-11-13 NOTE — Progress Notes (Signed)
[facility]  [address] [place],  [place]  [address] [phone]  Clinic Day:  11/13/2020  Referring physician: Townsend Roger, MD   CHIEF COMPLAINT:  CC: A 55 year old female with history of essential thrombocythemia here for 3 month evaluation  Current Treatment:  Hydroxyurea 500 mg 4 times daily   HISTORY OF PRESENT ILLNESS:  Allison Stevens is a 55 y.o. 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  This has been treated with hydroxyurea, but the patient has often been non-compliant.  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  She has persistent pain in the left upper quadrant, attributed to splenomegaly, as well as bone pain, for which she remains on OxyContin with oxycodone for breakthrough.  We tapered her opioids down as much as possible over time.  She remains on 40 mg every 12 hours with oxycodone 10 mg every 4 hours as needed for breakthrough pain.  She saw Dr. Lacinda Axon, her gastroenterologist at [facility], and underwent an esophageal dilatation in August 2016.  She had already had a colonoscopy due to previous gastrointestinal complaints.  We have been following a lesion in the pancreas seen incidentally on CT done in the emergency room for abdominal pain.  This has been felt to be a benign cyst.  Repeat MRI abdomen in November 2016, revealed a slight increase in the benign appearing cyst of the pancreas.  No splenomegaly was seen.  Repeat MRI abdomen in 1 year was recommended.  She was hospitalized in September 2017 and underwent colonoscopy with removal of a small polyp in the sigmoid colon.  Small internal hemorrhoids were also seen.  Dr. Lyndel Safe also obtained a CEA and CA 19-9, and. the CA 19-9 was elevated at 145, the CEA was normal.  Repeat MRI abdomen with contrast in September 2017 after discharge from the hospital revealed a stable 14 mm cystic lesion in the pancreas.  Again, no  splenomegaly was seen.  Follow-up MRI in 1 year was recommended.  Repeat MRI abdomen was done in March 2019 and revealed a 17 mm cystic lesion in the pancreatic uncinate process, which was felt to be stable.  No significant liver lesions were seen.  Repeat MRI abdomen in 1 year was recommended.  Repeat MRI abdomen in September 2019 revealed a stable 19 mm cystic lesion of the pancreas, which appeared to be benign and still felt to be stable.  Repeat MRI abdomen with and without contrast in 1 year was recommended.  MRI abdomen in October 2020 revealed a stable 17 mm cystic lesion of the pancreas.  Continued yearly follow up MRI abdomen was recommended.  Screening mammogram in February 2016 revealed a possible mass in the right breast.  Right diagnostic mammogram and ultrasound was done and revealed 2 well circumscribed hypoechoic lesions in the right breast at 11 o 'clock, felt to represent cysts.  Repeat mammogram and ultrasound in August revealed heterogeneous fibroglandular tissue felt to be benign, with resolution of the previously seen cysts and no masses.  Repeat diagnostic bilateral mammogram and ultrasound in 6 months was once again recommended.  Bilateral diagnostic mammogram in March 2017 did not reveal any evidence of malignancy, so 1 year follow-up screening mammogram was recommended.  Bilateral screening mammogram in March 2018 did not reveal any evidence of malignancy.  Bone density scan in June 2018 revealed osteopenia with a T-score of -1.5 in the femur.  The patient was instructed to  take calcium and vitamin-D twice daily.  The patient's family history is significant in that her father had pancreatic cancer at age 21, a paternal uncle had pancreatic cancer and a paternal aunt had pancreatic cancer.  Her brother also had pancreatic cancer at age 53.  She is appropriate for genetic testing, but this has not been done yet due to insurance coverage.  Bilateral screening mammogram in December 2020 did  not reveal any evidence of malignancy.  At her visit in June 2020, we tried decreasing her OxyContin 30 mg every 12 hours, but then increased back to 40 mg every 12 hours, as her pain was not controlled.  She has since continued the same narcotic regimen.  Repeat MRI abdomen in September revealed a 1.9 cm cystic lesion in the pancreatic uncinate process, which showed no significant change compared to more recent exams, but has shown a slow increase in size since 2016. 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 Recommend continued follow-up by MRI in 2 years   INTERVAL HISTORY:  Allison Stevens is here today for evaluation of essential thrombocythemia. She continue with hydroxyurea 4 times daily and is tolerating well. She continues to have chronic left sided abdominal pain which she rates as 4/10 today.She continues to have chronic headaches. She reports multiple falls since her last visit. She states her legs become weak and she goes down. Her appetite is good and she is down 2 pounds since last visit. She denies fever, chills, nausea, or vomiting. She denies issue with bowel or bladder. She denies shortness of breath, chest pain or cough. CBC and CMP are unremarkable today.  REVIEW OF SYSTEMS:  Review of Systems  Constitutional: Negative for appetite change, chills, diaphoresis, fatigue, fever and unexpected weight change.  HENT:   Negative for hearing loss, lump/mass, mouth sores, nosebleeds, sore throat, tinnitus, trouble swallowing and voice change.   Eyes: Negative for eye problems and icterus.  Respiratory: Negative for chest tightness, cough, hemoptysis, shortness of breath and wheezing.   Cardiovascular: Negative for chest pain, leg swelling and palpitations.  Gastrointestinal: Positive for abdominal pain. Negative for abdominal distention, blood in stool, constipation, diarrhea, nausea, rectal pain and vomiting.  Endocrine:  Negative for hot flashes.  Genitourinary: Negative for bladder incontinence, difficulty urinating, dyspareunia, dysuria, frequency, hematuria and nocturia.   Musculoskeletal: Negative for arthralgias, back pain, flank pain, gait problem, myalgias, neck pain and neck stiffness.  Skin: Negative for itching, rash and wound.  Neurological: Positive for extremity weakness and headaches. Negative for dizziness, gait problem, light-headedness, numbness, seizures and speech difficulty.  Hematological: Negative for adenopathy. Does not bruise/bleed easily.  Psychiatric/Behavioral: Negative for confusion, decreased concentration, depression, sleep disturbance and suicidal ideas. The patient is not nervous/anxious.      VITALS:  There were no vitals taken for this visit.  Wt Readings from Last 3 Encounters:  07/17/20 148 lb 3.2 oz (67.2 kg)  05/28/19 131 lb 6.4 oz (59.6 kg)  12/27/16 141 lb 3.2 oz (64 kg)    There is no height or weight on file to calculate BMI.  Performance status (ECOG): 1 - Symptomatic but completely ambulatory  PHYSICAL EXAM:  Physical Exam Constitutional:      General: She is not in acute distress.    Appearance: Normal appearance. She is normal weight. She is not ill-appearing, toxic-appearing or diaphoretic.  HENT:     Head: Normocephalic and atraumatic.     Nose: Nose normal. No congestion  or rhinorrhea.     Mouth/Throat:     Mouth: Mucous membranes are moist.     Pharynx: Oropharynx is clear. No oropharyngeal exudate or posterior oropharyngeal erythema.  Eyes:     General: No scleral icterus.       Right eye: No discharge.        Left eye: No discharge.     Extraocular Movements: Extraocular movements intact.     Conjunctiva/sclera: Conjunctivae normal.     Pupils: Pupils are equal, round, and reactive to light.  Neck:     Vascular: No carotid bruit.  Cardiovascular:     Rate and Rhythm: Normal rate and regular rhythm.     Heart sounds: No murmur heard. No  friction rub. No gallop.   Pulmonary:     Effort: Pulmonary effort is normal. No respiratory distress.     Breath sounds: Normal breath sounds. No stridor. No wheezing, rhonchi or rales.  Chest:     Chest wall: No tenderness.  Abdominal:     General: Abdomen is flat. Bowel sounds are normal. There is no distension.     Palpations: There is no mass.     Tenderness: There is no abdominal tenderness. There is no right CVA tenderness, left CVA tenderness, guarding or rebound.     Hernia: No hernia is present.  Musculoskeletal:        General: No swelling, tenderness, deformity or signs of injury. Normal range of motion.     Cervical back: Normal range of motion and neck supple. No rigidity or tenderness.     Right lower leg: No edema.     Left lower leg: No edema.  Lymphadenopathy:     Cervical: No cervical adenopathy.  Skin:    General: Skin is warm and dry.     Capillary Refill: Capillary refill takes less than 2 seconds.     Coloration: Skin is not jaundiced or pale.     Findings: No bruising, erythema, lesion or rash.  Neurological:     General: No focal deficit present.     Mental Status: She is alert and oriented to person, place, and time. Mental status is at baseline.     Cranial Nerves: No cranial nerve deficit.     Sensory: No sensory deficit.     Motor: No weakness.     Coordination: Coordination normal.     Gait: Gait normal.     Deep Tendon Reflexes: Reflexes normal.  Psychiatric:        Mood and Affect: Mood normal.        Behavior: Behavior normal.        Thought Content: Thought content normal.        Judgment: Judgment normal.    LABS:   CBC Latest Ref Rng & Units 07/17/2020  WBC - 5.4  Hemoglobin 12.0 - 16.0 10.7(A)  Hematocrit 36 - 46 32(A)  Platelets 150 - 399 455(A)   CMP Latest Ref Rng & Units 07/17/2020  BUN 4 - 21 10  Creatinine 0.5 - 1.1 0.7  Sodium 137 - 147 142  Potassium 3.4 - 5.3 3.6  Chloride 99 - 108 107  CO2 13 - 22 29(A)  Calcium 8.7  - 10.7 9.4  Alkaline Phos 25 - 125 82  AST 13 - 35 39(A)  ALT 7 - 35 49(A)     No results found for: CEA1 / No results found for: CEA1 No results found for: PSA1 No results found for: OIB704 No  results found for: CAN125  No results found for: TOTALPROTELP, ALBUMINELP, A1GS, A2GS, BETS, BETA2SER, GAMS, MSPIKE, SPEI No results found for: TIBC, FERRITIN, IRONPCTSAT No results found for: LDH  STUDIES:  No results found.    HISTORY:   Past Medical History:  Diagnosis Date  . Acid reflux   . Diabetes ([place]) 2002  . Gait abnormality   . Hypertension   . Migraine   . Stroke ([place])   . Thrombocytosis     Past Surgical History:  Procedure Laterality Date  . CESAREAN SECTION  1997  . GALLBLADDER SURGERY  2011    Family History  Problem Relation Age of Onset  . Diabetes Mother   . Cancer Mother   . Cancer Father   . Healthy Child     Social History:  reports that she has never smoked. She has never used smokeless tobacco. She reports that she does not drink alcohol and does not use drugs.The patient is alone today.  Allergies:  Allergies  Allergen Reactions  . Iodinated Diagnostic Agents Other (See Comments) and Shortness Of Breath    Current Medications: Current Outpatient Medications  Medication Sig Dispense Refill  . ALPRAZolam (XANAX) 0.5 MG tablet Take 0.5 mg by mouth at bedtime as needed for anxiety.    Marland Kitchen amLODipine (NORVASC) 10 MG tablet Take 10 mg by mouth daily.    . clopidogrel (PLAVIX) 75 MG tablet Take 1 tablet (75 mg total) by mouth daily. 30 tablet 0  . enalapril (VASOTEC) 10 MG tablet Take 10 mg by mouth 2 (two) times daily.    Marland Kitchen escitalopram (LEXAPRO) 10 MG tablet Take 10 mg by mouth daily.    . hydroxyurea (HYDREA) 500 MG capsule TAKE 1 CAPSULE BY MOUTH FOUR TIMES A DAY 360 capsule 1  . ibuprofen (ADVIL,MOTRIN) 800 MG tablet Take 800 mg by mouth every 8 (eight) hours as needed.    . insulin aspart (NOVOLOG) 100 UNIT/ML injection Inject 34 Units into  the skin 3 (three) times daily before meals.    . Insulin Detemir (LEVEMIR) 100 UNIT/ML Pen Inject into the skin.    . Insulin Glargine (TOUJEO MAX SOLOSTAR) 300 UNIT/ML SOPN Inject 72 Units into the skin daily.    . metFORMIN (GLUCOPHAGE) 1000 MG tablet Take 1,000 mg by mouth 2 (two) times daily with a meal.    . montelukast (SINGULAIR) 10 MG tablet Take 10 mg by mouth at bedtime.    . niacin 500 MG tablet Take 500 mg by mouth at bedtime.    Marland Kitchen oxyCODONE (OXYCONTIN) 40 mg 12 hr tablet Take 1 tablet (40 mg total) by mouth every 12 (twelve) hours. 60 tablet 0  . Oxycodone HCl 10 MG TABS Take 1 tablet (10 mg total) by mouth every 4 (four) hours as needed (pain). 120 tablet 0  . pantoprazole (PROTONIX) 40 MG tablet Take 1 tablet (40 mg total) by mouth daily. 30 tablet 6  . pravastatin (PRAVACHOL) 40 MG tablet Take 40 mg by mouth daily.    . prochlorperazine (COMPAZINE) 10 MG tablet Take 10 mg by mouth every 6 (six) hours as needed for nausea or vomiting.    . TRULICITY 3 OM/3.5DH SOPN      No current facility-administered medications for this visit.     ASSESSMENT & PLAN:   Assessment:  1. Essential thrombocytosis, well controlled with hydroxyurea 500 mg 4 times daily.  She is on chronic opioids due to bone pain.    2. First-degree relatives with pancreatic  cancer.  Genetic testing has been recommended, but not done due to lack of insurance coverage. Her brother is currently being treated for prostate cancer.  3. Uncontrolled hypertension .  She monitors this at home as well.   Plan: She will continue hydroxyurea 4 times daily. We discussed continuing to see her primary care physician who is managing her multiple other health concerns, especially her hypertension which seems to remain elevated. We discussed the use of a cane for her extremity weakness as she reports this is the reason for her falls. She will also discuss this with her PCP. She will return to clinic in 3 months with repeat CBC,  CMP and evaluation.   She verbalizes understanding of and agreement to the plans discussed today. She knows to call the office should any new questions or concerns arise.    Melodye Ped, NP

## 2020-11-16 ENCOUNTER — Other Ambulatory Visit: Payer: Self-pay

## 2020-11-16 ENCOUNTER — Encounter: Payer: Self-pay | Admitting: Hematology and Oncology

## 2020-11-16 ENCOUNTER — Ambulatory Visit: Payer: Medicare Other | Admitting: Oncology

## 2020-11-16 ENCOUNTER — Inpatient Hospital Stay (INDEPENDENT_AMBULATORY_CARE_PROVIDER_SITE_OTHER): Payer: Medicare Other | Admitting: Hematology and Oncology

## 2020-11-16 ENCOUNTER — Inpatient Hospital Stay: Payer: Medicare Other | Attending: Hematology and Oncology

## 2020-11-16 VITALS — BP 187/81 | HR 82 | Temp 98.6°F | Resp 18 | Ht 63.5 in | Wt 146.7 lb

## 2020-11-16 DIAGNOSIS — D473 Essential (hemorrhagic) thrombocythemia: Secondary | ICD-10-CM

## 2020-11-16 DIAGNOSIS — D649 Anemia, unspecified: Secondary | ICD-10-CM | POA: Diagnosis not present

## 2020-11-17 DIAGNOSIS — M25441 Effusion, right hand: Secondary | ICD-10-CM | POA: Diagnosis not present

## 2020-11-17 DIAGNOSIS — M6281 Muscle weakness (generalized): Secondary | ICD-10-CM | POA: Diagnosis not present

## 2020-11-17 DIAGNOSIS — M24542 Contracture, left hand: Secondary | ICD-10-CM | POA: Diagnosis not present

## 2020-11-17 DIAGNOSIS — M65332 Trigger finger, left middle finger: Secondary | ICD-10-CM | POA: Diagnosis not present

## 2020-11-17 DIAGNOSIS — M65331 Trigger finger, right middle finger: Secondary | ICD-10-CM | POA: Diagnosis not present

## 2020-11-17 DIAGNOSIS — M24541 Contracture, right hand: Secondary | ICD-10-CM | POA: Diagnosis not present

## 2020-11-23 ENCOUNTER — Other Ambulatory Visit: Payer: Self-pay

## 2020-11-23 DIAGNOSIS — R161 Splenomegaly, not elsewhere classified: Secondary | ICD-10-CM

## 2020-11-23 DIAGNOSIS — M898X9 Other specified disorders of bone, unspecified site: Secondary | ICD-10-CM

## 2020-11-23 MED ORDER — OXYCODONE HCL ER 40 MG PO T12A: 40.0000 mg | EXTENDED_RELEASE_TABLET | Freq: Two times a day (BID) | ORAL | 0 refills | Status: DC

## 2020-11-23 MED ORDER — OXYCODONE HCL 10 MG PO TABS: 10.0000 mg | ORAL_TABLET | ORAL | 0 refills | Status: DC | PRN

## 2020-11-24 DIAGNOSIS — N390 Urinary tract infection, site not specified: Secondary | ICD-10-CM | POA: Diagnosis not present

## 2020-11-24 DIAGNOSIS — F419 Anxiety disorder, unspecified: Secondary | ICD-10-CM | POA: Diagnosis not present

## 2020-11-24 DIAGNOSIS — E162 Hypoglycemia, unspecified: Secondary | ICD-10-CM | POA: Diagnosis not present

## 2020-11-24 DIAGNOSIS — E782 Mixed hyperlipidemia: Secondary | ICD-10-CM | POA: Diagnosis not present

## 2020-11-24 DIAGNOSIS — E11319 Type 2 diabetes mellitus with unspecified diabetic retinopathy without macular edema: Secondary | ICD-10-CM | POA: Diagnosis not present

## 2020-11-30 ENCOUNTER — Encounter: Payer: Self-pay | Admitting: Hematology and Oncology

## 2020-12-11 ENCOUNTER — Telehealth: Payer: Self-pay | Admitting: Hematology and Oncology

## 2020-12-11 DIAGNOSIS — Z9049 Acquired absence of other specified parts of digestive tract: Secondary | ICD-10-CM | POA: Diagnosis not present

## 2020-12-11 DIAGNOSIS — E119 Type 2 diabetes mellitus without complications: Secondary | ICD-10-CM | POA: Diagnosis not present

## 2020-12-11 DIAGNOSIS — G319 Degenerative disease of nervous system, unspecified: Secondary | ICD-10-CM | POA: Diagnosis not present

## 2020-12-11 DIAGNOSIS — Z8673 Personal history of transient ischemic attack (TIA), and cerebral infarction without residual deficits: Secondary | ICD-10-CM | POA: Diagnosis not present

## 2020-12-11 DIAGNOSIS — J019 Acute sinusitis, unspecified: Secondary | ICD-10-CM | POA: Diagnosis not present

## 2020-12-11 DIAGNOSIS — R29898 Other symptoms and signs involving the musculoskeletal system: Secondary | ICD-10-CM | POA: Diagnosis not present

## 2020-12-11 DIAGNOSIS — R4701 Aphasia: Secondary | ICD-10-CM | POA: Diagnosis not present

## 2020-12-11 DIAGNOSIS — R4781 Slurred speech: Secondary | ICD-10-CM | POA: Diagnosis not present

## 2020-12-11 DIAGNOSIS — R4789 Other speech disturbances: Secondary | ICD-10-CM | POA: Diagnosis not present

## 2020-12-11 DIAGNOSIS — I10 Essential (primary) hypertension: Secondary | ICD-10-CM | POA: Diagnosis not present

## 2020-12-11 DIAGNOSIS — R531 Weakness: Secondary | ICD-10-CM | POA: Diagnosis not present

## 2020-12-11 NOTE — Telephone Encounter (Signed)
Per 4/25 Staff Message, patient scheduled for July Appt's.  Notified patient

## 2020-12-18 DIAGNOSIS — Z79899 Other long term (current) drug therapy: Secondary | ICD-10-CM | POA: Diagnosis not present

## 2020-12-18 DIAGNOSIS — E1100 Type 2 diabetes mellitus with hyperosmolarity without nonketotic hyperglycemic-hyperosmolar coma (NKHHC): Secondary | ICD-10-CM | POA: Diagnosis not present

## 2020-12-18 DIAGNOSIS — Z7984 Long term (current) use of oral hypoglycemic drugs: Secondary | ICD-10-CM | POA: Diagnosis not present

## 2020-12-18 DIAGNOSIS — Z5321 Procedure and treatment not carried out due to patient leaving prior to being seen by health care provider: Secondary | ICD-10-CM | POA: Diagnosis not present

## 2020-12-18 DIAGNOSIS — R630 Anorexia: Secondary | ICD-10-CM | POA: Diagnosis not present

## 2020-12-18 DIAGNOSIS — E1169 Type 2 diabetes mellitus with other specified complication: Secondary | ICD-10-CM | POA: Diagnosis not present

## 2020-12-18 DIAGNOSIS — Z7982 Long term (current) use of aspirin: Secondary | ICD-10-CM | POA: Diagnosis not present

## 2020-12-18 DIAGNOSIS — Z28310 Unvaccinated for covid-19: Secondary | ICD-10-CM | POA: Diagnosis not present

## 2020-12-18 DIAGNOSIS — U071 COVID-19: Secondary | ICD-10-CM | POA: Diagnosis not present

## 2020-12-18 DIAGNOSIS — Z794 Long term (current) use of insulin: Secondary | ICD-10-CM | POA: Diagnosis not present

## 2020-12-18 DIAGNOSIS — Z8673 Personal history of transient ischemic attack (TIA), and cerebral infarction without residual deficits: Secondary | ICD-10-CM | POA: Diagnosis not present

## 2020-12-18 DIAGNOSIS — I10 Essential (primary) hypertension: Secondary | ICD-10-CM | POA: Diagnosis not present

## 2020-12-22 ENCOUNTER — Other Ambulatory Visit: Payer: Self-pay

## 2020-12-22 DIAGNOSIS — R161 Splenomegaly, not elsewhere classified: Secondary | ICD-10-CM

## 2020-12-22 DIAGNOSIS — M898X9 Other specified disorders of bone, unspecified site: Secondary | ICD-10-CM

## 2020-12-22 MED ORDER — OXYCODONE HCL 10 MG PO TABS: 10.0000 mg | ORAL_TABLET | ORAL | 0 refills | Status: DC | PRN

## 2020-12-22 MED ORDER — OXYCODONE HCL ER 40 MG PO T12A: 40.0000 mg | EXTENDED_RELEASE_TABLET | Freq: Two times a day (BID) | ORAL | 0 refills | Status: DC

## 2021-01-02 ENCOUNTER — Other Ambulatory Visit: Payer: Self-pay | Admitting: Hematology and Oncology

## 2021-01-02 DIAGNOSIS — D473 Essential (hemorrhagic) thrombocythemia: Secondary | ICD-10-CM

## 2021-01-19 ENCOUNTER — Other Ambulatory Visit: Payer: Self-pay

## 2021-01-19 DIAGNOSIS — M898X9 Other specified disorders of bone, unspecified site: Secondary | ICD-10-CM

## 2021-01-19 DIAGNOSIS — R161 Splenomegaly, not elsewhere classified: Secondary | ICD-10-CM

## 2021-01-19 MED ORDER — OXYCODONE HCL 10 MG PO TABS: 10.0000 mg | ORAL_TABLET | ORAL | 0 refills | Status: DC | PRN

## 2021-01-19 MED ORDER — OXYCODONE HCL ER 40 MG PO T12A: 40.0000 mg | EXTENDED_RELEASE_TABLET | Freq: Two times a day (BID) | ORAL | 0 refills | Status: DC

## 2021-01-22 DIAGNOSIS — R350 Frequency of micturition: Secondary | ICD-10-CM | POA: Diagnosis not present

## 2021-01-28 ENCOUNTER — Other Ambulatory Visit: Payer: Self-pay

## 2021-01-28 ENCOUNTER — Inpatient Hospital Stay: Payer: Medicare Other

## 2021-01-28 ENCOUNTER — Encounter: Payer: Self-pay | Admitting: Hematology and Oncology

## 2021-01-28 ENCOUNTER — Telehealth: Payer: Self-pay | Admitting: Hematology and Oncology

## 2021-01-28 ENCOUNTER — Other Ambulatory Visit: Payer: Self-pay | Admitting: Hematology and Oncology

## 2021-01-28 ENCOUNTER — Inpatient Hospital Stay: Payer: Medicare Other | Attending: Hematology and Oncology | Admitting: Hematology and Oncology

## 2021-01-28 VITALS — BP 196/97 | HR 85 | Temp 98.6°F | Resp 16 | Ht 63.5 in | Wt 143.1 lb

## 2021-01-28 DIAGNOSIS — D473 Essential (hemorrhagic) thrombocythemia: Secondary | ICD-10-CM | POA: Diagnosis not present

## 2021-01-28 DIAGNOSIS — D649 Anemia, unspecified: Secondary | ICD-10-CM | POA: Diagnosis not present

## 2021-01-28 LAB — BASIC METABOLIC PANEL
BUN: 7 (ref 4–21)
CO2: 25 — AB (ref 13–22)
Chloride: 105 (ref 99–108)
Creatinine: 0.6 (ref 0.5–1.1)
Glucose: 140
Potassium: 3.9 (ref 3.4–5.3)
Sodium: 139 (ref 137–147)

## 2021-01-28 LAB — CBC AND DIFFERENTIAL
HCT: 31 — AB (ref 36–46)
Hemoglobin: 10.1 — AB (ref 12.0–16.0)
Neutrophils Absolute: 2.75
Platelets: 362 (ref 150–399)
WBC: 4.3

## 2021-01-28 LAB — CBC: RBC: 3.29 — AB (ref 3.87–5.11)

## 2021-01-28 LAB — COMPREHENSIVE METABOLIC PANEL
Albumin: 4.2 (ref 3.5–5.0)
Calcium: 9.2 (ref 8.7–10.7)

## 2021-01-28 LAB — HEPATIC FUNCTION PANEL
ALT: 24 (ref 7–35)
AST: 41 — AB (ref 13–35)
Alkaline Phosphatase: 70 (ref 25–125)
Bilirubin, Total: 0.4

## 2021-01-28 NOTE — Telephone Encounter (Signed)
Per 6/30 los next appt scheduled and given to patient 

## 2021-01-28 NOTE — Progress Notes (Signed)
Allison Stevens  [address] [place],    [id number] [phone]  Clinic Day:  01/28/2021  Referring physician: Townsend Roger, MD   CHIEF COMPLAINT:  CC: Essential thrombocythemia  Current Treatment:   Hydroxyurea 500 mg 4 times daily   HISTORY OF PRESENT ILLNESS:  Allison Stevens is a 55 y.o. female with a history of essential thrombocythemia originally diagnosed in January 2005.  This has been treated with hydroxyurea, but the patient has often been non-compliant.  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  She has persistent pain in the left upper quadrant, attributed to splenomegaly, as well as bone pain, for which she remains on OxyContin with oxycodone for breakthrough.  We tapered her opioids down as much as possible over time.    She saw Dr. Lacinda Axon, her gastroenterologist at [facility], and underwent an esophageal dilatation in August 2016.  She had already had a colonoscopy due to previous gastrointestinal complaints.  We have been following a lesion in the pancreas seen incidentally on CT done in the emergency room for abdominal pain.  This has been felt to be a benign cyst.  Repeat MRI abdomen in November 2016, revealed a slight increase in the benign appearing cyst of the pancreas.  No splenomegaly was seen.  Repeat MRI abdomen in 1 year was recommended.  She was hospitalized in September 2017 and underwent colonoscopy with removal of a small polyp in the sigmoid colon.  Small internal hemorrhoids were also seen.  Dr. Lyndel Safe also obtained a CEA and CA 19-9, and. the CA 19-9 was elevated at 145, the CEA was normal.  Repeat MRI abdomen with contrast in September 2017 revealed a stable 14 mm cystic lesion in the pancreas.  Again, no splenomegaly was seen.  Follow-up MRI in 1 year was recommended.  Repeat MRI abdomen in March 2019 revealed a 17 mm cystic lesion in the pancreatic uncinate process, which was felt to be  stable.  No significant liver lesions were seen.  Repeat MRI abdomen in 1 year was recommended.  Repeat MRI abdomen in September 2019 revealed a stable 19 mm cystic lesion of the pancreas, which appeared to be benign and still felt to be stable.  Repeat MRI abdomen with and without contrast in 1 year was recommended.  MRI abdomen in October 2020 revealed a stable 17 mm cystic lesion of the pancreas.  Continued yearly follow up MRI abdomen was recommended. At her visit in June 2020, we tried decreasing her OxyContin 30 mg every 12 hours, but then increased back to 40 mg every 12 hours, as her pain was not controlled.  She has since continued the same narcotic regimen.   Screening mammogram in February 2016 revealed a possible mass in the right breast.  Right diagnostic mammogram and ultrasound was done and revealed 2 well circumscribed hypoechoic lesions in the right breast at 11 o 'clock, felt to represent cysts.  Repeat mammogram and ultrasound in August revealed heterogeneous fibroglandular tissue felt to be benign, with resolution of the previously seen cysts and no masses.  Repeat diagnostic bilateral mammogram and ultrasound in 6 months was once again recommended.  Bilateral diagnostic mammogram in March 2017 did not reveal any evidence of malignancy, so 1 year follow-up screening mammogram was recommended.  Bilateral screening mammogram in March 2018 did not reveal any evidence of malignancy.  Bone density scan in June 2018 revealed osteopenia with a T-score of -1.5 in the  femur.  The patient was instructed to take calcium and vitamin-D twice daily.  The patient's family history is significant in that her father had pancreatic cancer at age 30, a paternal uncle had pancreatic cancer and a paternal aunt had pancreatic cancer.  Her brother also had pancreatic cancer at age 22.  She is appropriate for genetic testing, but this has not been done yet due to insurance coverage.  Bilateral screening mammogram in  December 2020 did not reveal any evidence of malignancy.   Repeat MRI abdomen in September 2021 revealed a 1.9 cm cystic lesion in the pancreatic uncinate process, which showed no significant change compared to more recent exams, but has shown a slow increase in size since 2016. 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 Follow-up by MRI in 2 years was recommended. She remains on 40 mg every 12 hours with oxycodone 10 mg every 4 hours as needed for breakthrough pain.  INTERVAL HISTORY:  Allison Stevens is here today for repeat clinical assessment and states she is only taking hydroxyurea 3 times daily.  She reports having another stroke in May. She attributes this to stress.  She had more right-sided symptoms with this episode and reports persistent weakness of the bilateral upper and lower extremities. She then had COVID later in May and was hospitalized due to this.  She has been having headaches since having COVID.  She uses a walker to ambulate She denies fevers or chills. She states her pain is fairly well controlled with the current regimen. Her appetite is decreased. Her weight has decreased 3 pounds over last 3 months .  She states her blood pressure is always elevated in the doctor's office and she recently saw Dr. Manson Allan. She states her husband has just been diagnosed with kidney failure and they are doing his dialysis at home.  She also cares for her 94-year-old grandson. She states she is coping fairly well.  REVIEW OF SYSTEMS:  Review of Systems  Constitutional:  Positive for appetite change and unexpected weight change. Negative for chills, fatigue and fever.  HENT:   Negative for lump/mass, mouth sores and sore throat.   Respiratory:  Negative for cough and shortness of breath.   Cardiovascular:  Negative for chest pain and leg swelling.  Gastrointestinal:  Positive for nausea. Negative for abdominal pain, constipation,  diarrhea and vomiting.  Endocrine: Negative for hot flashes.  Genitourinary:  Negative for difficulty urinating, dysuria, frequency and hematuria.   Musculoskeletal:  Positive for gait problem. Negative for arthralgias, back pain and myalgias.  Skin:  Negative for rash.  Neurological:  Positive for extremity weakness, gait problem and headaches. Negative for dizziness.  Hematological:  Negative for adenopathy. Does not bruise/bleed easily.  Psychiatric/Behavioral:  Negative for depression and sleep disturbance. The patient is not nervous/anxious.     VITALS:  Blood pressure (!) 196/97, pulse 85, temperature 98.6 F (37 C), temperature source Oral, resp. rate 16, height 5' 3.5" (1.613 m), weight 143 lb 1.6 oz (64.9 kg), SpO2 100 %.  Wt Readings from Last 3 Encounters:  01/28/21 143 lb 1.6 oz (64.9 kg)  11/16/20 146 lb 11.2 oz (66.5 kg)  07/17/20 148 lb 3.2 oz (67.2 kg)    Body mass index is 24.95 kg/m.  Performance status (ECOG): 2 - Symptomatic, <50% confined to bed  PHYSICAL EXAM:  Physical Exam Vitals and nursing note reviewed.  Constitutional:      General: She is not in  acute distress.    Appearance: Normal appearance.  HENT:     Head: Normocephalic and atraumatic.     Mouth/Throat:     Mouth: Mucous membranes are moist.     Pharynx: Oropharynx is clear. No oropharyngeal exudate or posterior oropharyngeal erythema.  Eyes:     General: No scleral icterus.    Extraocular Movements: Extraocular movements intact.     Conjunctiva/sclera: Conjunctivae normal.     Pupils: Pupils are equal, round, and reactive to light.  Cardiovascular:     Rate and Rhythm: Normal rate and regular rhythm.     Heart sounds: Normal heart sounds. No murmur heard.   No friction rub. No gallop.  Pulmonary:     Effort: Pulmonary effort is normal.     Breath sounds: Normal breath sounds. No wheezing, rhonchi or rales.  Chest:  Breasts:    Right: No axillary adenopathy or supraclavicular  adenopathy.     Left: No axillary adenopathy or supraclavicular adenopathy.  Abdominal:     General: There is no distension.     Palpations: Abdomen is soft. There is no hepatomegaly, splenomegaly or mass.     Tenderness: There is abdominal tenderness in the left upper quadrant.  Musculoskeletal:        General: Normal range of motion.     Cervical back: Normal range of motion and neck supple. No tenderness.     Right lower leg: No edema.     Left lower leg: No edema.  Lymphadenopathy:     Cervical: No cervical adenopathy.     Upper Body:     Right upper body: No supraclavicular or axillary adenopathy.     Left upper body: No supraclavicular or axillary adenopathy.     Lower Body: No right inguinal adenopathy. No left inguinal adenopathy.  Skin:    General: Skin is warm and dry.     Coloration: Skin is not jaundiced.     Findings: No rash.  Neurological:     Mental Status: She is alert and oriented to person, place, and time.     Cranial Nerves: Cranial nerves are intact. No cranial nerve deficit.     Motor: Weakness (decreased grip, bilateral hands) present.     Gait: Gait abnormal.  Psychiatric:        Mood and Affect: Mood normal.        Behavior: Behavior normal.        Thought Content: Thought content normal.   LABS:   CBC Latest Ref Rng & Units 01/28/2021 07/17/2020  WBC - 4.3 5.4  Hemoglobin 12.0 - 16.0 10.1(A) 10.7(A)  Hematocrit 36 - 46 31(A) 32(A)  Platelets 150 - 399 362 455(A)   CMP Latest Ref Rng & Units 01/28/2021 07/17/2020  BUN 4 - 21 7 10   Creatinine 0.5 - 1.1 0.6 0.7  Sodium 137 - 147 139 142  Potassium 3.4 - 5.3 3.9 3.6  Chloride 99 - 108 105 107  CO2 13 - 22 25(A) 29(A)  Calcium 8.7 - 10.7 9.2 9.4  Alkaline Phos 25 - 125 70 82  AST 13 - 35 41(A) 39(A)  ALT 7 - 35 24 49(A)     No results found for: CEA1 / No results found for: CEA1 No results found for: PSA1 No results found for: LFY101 No results found for: BPZ025  No results found for:  TOTALPROTELP, ALBUMINELP, A1GS, A2GS, BETS, BETA2SER, GAMS, MSPIKE, SPEI No results found for: TIBC, FERRITIN, IRONPCTSAT No results found for: LDH  STUDIES:  No results found.    HISTORY:   Past Medical History:  Diagnosis Date   Acid reflux    Diabetes ([place]) 2002   Gait abnormality    Hypertension    Migraine    Stroke ([place])    Thrombocytosis     Past Surgical History:  Procedure Laterality Date   CESAREAN SECTION  1997   GALLBLADDER SURGERY  2011    Family History  Problem Relation Age of Onset   Diabetes Mother    Cancer Mother    Cancer Father    Healthy Child     Social History:  reports that she has never smoked. She has never used smokeless tobacco. She reports that she does not drink alcohol and does not use drugs.The patient is alone today.  Allergies:  Allergies  Allergen Reactions   Iodinated Diagnostic Agents Other (See Comments) and Shortness Of Breath    Current Medications: Current Outpatient Medications  Medication Sig Dispense Refill   ALPRAZolam (XANAX) 0.5 MG tablet Take 0.5 mg by mouth at bedtime as needed for anxiety.     amLODipine (NORVASC) 10 MG tablet Take 10 mg by mouth daily.     carvedilol (COREG) 12.5 MG tablet Take 12.5 mg by mouth 2 (two) times daily.     clopidogrel (PLAVIX) 75 MG tablet Take 1 tablet (75 mg total) by mouth daily. 30 tablet 0   diclofenac Sodium (VOLTAREN) 1 % GEL Apply 4 g topically 4 (four) times daily.     enalapril (VASOTEC) 10 MG tablet Take 10 mg by mouth 2 (two) times daily.     escitalopram (LEXAPRO) 10 MG tablet Take 10 mg by mouth daily.     hydroxyurea (HYDREA) 500 MG capsule TAKE 1 CAPSULE BY MOUTH FOUR TIMES A DAY 360 capsule 1   ibuprofen (ADVIL,MOTRIN) 800 MG tablet Take 800 mg by mouth every 8 (eight) hours as needed.     insulin aspart (NOVOLOG) 100 UNIT/ML injection Inject 34 Units into the skin 3 (three) times daily before meals.     Insulin Detemir (LEVEMIR) 100 UNIT/ML Pen Inject into the  skin.     Insulin Glargine (TOUJEO MAX SOLOSTAR) 300 UNIT/ML SOPN Inject 72 Units into the skin daily.     metFORMIN (GLUCOPHAGE) 1000 MG tablet Take 1,000 mg by mouth 2 (two) times daily with a meal.     metoCLOPramide (REGLAN) 5 MG tablet Take 5 mg by mouth every 8 (eight) hours.     montelukast (SINGULAIR) 10 MG tablet Take 10 mg by mouth at bedtime.     niacin 500 MG tablet Take 500 mg by mouth at bedtime.     nitrofurantoin, macrocrystal-monohydrate, (MACROBID) 100 MG capsule Take 100 mg by mouth 2 (two) times daily.     ondansetron (ZOFRAN-ODT) 4 MG disintegrating tablet TAKE 1 TABLET BY MOUTH EVERY 6 HOURS AS NEEDED FOR NAUSEA/VOMITING     oxyCODONE (OXYCONTIN) 40 mg 12 hr tablet Take 1 tablet (40 mg total) by mouth every 12 (twelve) hours. 60 tablet 0   Oxycodone HCl 10 MG TABS Take 1 tablet (10 mg total) by mouth every 4 (four) hours as needed (pain). 120 tablet 0   pantoprazole (PROTONIX) 40 MG tablet Take 1 tablet (40 mg total) by mouth daily. 30 tablet 6   Potassium Chloride ER 20 MEQ TBCR Take 1 tablet by mouth daily.     pravastatin (PRAVACHOL) 40 MG tablet Take 40 mg by mouth daily.     prochlorperazine (  COMPAZINE) 10 MG tablet Take 10 mg by mouth every 6 (six) hours as needed for nausea or vomiting.     sertraline (ZOLOFT) 25 MG tablet Take 25 mg by mouth daily.     spironolactone (ALDACTONE) 25 MG tablet Take 0.5 tablets by mouth daily.     TRULICITY 3 FI/4.3PI SOPN      No current facility-administered medications for this visit.     ASSESSMENT & PLAN:   Assessment:   1. Essential thrombocythemia, well controlled with hydroxyurea 500 mg, which she is only taking 3 times daily.  She is on chronic opioids due to bone pain.     2. First-degree relatives with pancreatic cancer.  Genetic testing has been recommended, but not done due to lack of insurance coverage. Her brother is currently being treated for prostate cancer.   3. Uncontrolled hypertension .  She monitors this  at home and continues to follow with Dr. Manson Allan.  Plan:   She will continue hydroxyurea 500 mg 3 times daily.  I told her I am concerned about her blood pressure and headache, but she states she is fine.  We will plan to see her back in 3 months with a CBC and comprehensive metabolic panel for repeat clinical assessment. The patient understands the plans discussed today and is in agreement with them.  She knows to contact our office if she develops concerns prior to her next appointment.      [name], PA-C

## 2021-02-09 ENCOUNTER — Ambulatory Visit: Payer: Medicare Other | Admitting: Hematology and Oncology

## 2021-02-09 ENCOUNTER — Other Ambulatory Visit: Payer: Medicare Other

## 2021-02-18 ENCOUNTER — Other Ambulatory Visit: Payer: Self-pay

## 2021-02-18 DIAGNOSIS — R161 Splenomegaly, not elsewhere classified: Secondary | ICD-10-CM

## 2021-02-18 DIAGNOSIS — M898X9 Other specified disorders of bone, unspecified site: Secondary | ICD-10-CM

## 2021-02-18 MED ORDER — OXYCODONE HCL 10 MG PO TABS: 10.0000 mg | ORAL_TABLET | ORAL | 0 refills | Status: DC | PRN

## 2021-02-18 MED ORDER — OXYCODONE HCL ER 40 MG PO T12A: 40.0000 mg | EXTENDED_RELEASE_TABLET | Freq: Two times a day (BID) | ORAL | 0 refills | Status: DC

## 2021-02-22 DIAGNOSIS — M79642 Pain in left hand: Secondary | ICD-10-CM | POA: Diagnosis not present

## 2021-02-22 DIAGNOSIS — E11319 Type 2 diabetes mellitus with unspecified diabetic retinopathy without macular edema: Secondary | ICD-10-CM | POA: Diagnosis not present

## 2021-02-22 DIAGNOSIS — R350 Frequency of micturition: Secondary | ICD-10-CM | POA: Diagnosis not present

## 2021-02-22 DIAGNOSIS — M79641 Pain in right hand: Secondary | ICD-10-CM | POA: Diagnosis not present

## 2021-02-22 DIAGNOSIS — F419 Anxiety disorder, unspecified: Secondary | ICD-10-CM | POA: Diagnosis not present

## 2021-02-24 DIAGNOSIS — R8290 Unspecified abnormal findings in urine: Secondary | ICD-10-CM | POA: Diagnosis not present

## 2021-02-24 DIAGNOSIS — E08319 Diabetes mellitus due to underlying condition with unspecified diabetic retinopathy without macular edema: Secondary | ICD-10-CM | POA: Diagnosis not present

## 2021-03-08 DIAGNOSIS — G5621 Lesion of ulnar nerve, right upper limb: Secondary | ICD-10-CM | POA: Diagnosis not present

## 2021-03-08 DIAGNOSIS — M79641 Pain in right hand: Secondary | ICD-10-CM | POA: Diagnosis not present

## 2021-03-11 ENCOUNTER — Other Ambulatory Visit: Payer: Self-pay

## 2021-03-11 DIAGNOSIS — D473 Essential (hemorrhagic) thrombocythemia: Secondary | ICD-10-CM

## 2021-03-19 ENCOUNTER — Other Ambulatory Visit: Payer: Self-pay

## 2021-03-19 DIAGNOSIS — R161 Splenomegaly, not elsewhere classified: Secondary | ICD-10-CM

## 2021-03-19 DIAGNOSIS — M898X9 Other specified disorders of bone, unspecified site: Secondary | ICD-10-CM

## 2021-03-19 MED ORDER — OXYCODONE HCL ER 40 MG PO T12A: 40.0000 mg | EXTENDED_RELEASE_TABLET | Freq: Two times a day (BID) | ORAL | 0 refills | Status: DC

## 2021-03-19 MED ORDER — OXYCODONE HCL 10 MG PO TABS: 10.0000 mg | ORAL_TABLET | ORAL | 0 refills | Status: DC | PRN

## 2021-03-23 DIAGNOSIS — G5623 Lesion of ulnar nerve, bilateral upper limbs: Secondary | ICD-10-CM | POA: Diagnosis not present

## 2021-03-23 DIAGNOSIS — M65331 Trigger finger, right middle finger: Secondary | ICD-10-CM | POA: Diagnosis not present

## 2021-03-23 DIAGNOSIS — M65332 Trigger finger, left middle finger: Secondary | ICD-10-CM | POA: Diagnosis not present

## 2021-03-23 DIAGNOSIS — G5603 Carpal tunnel syndrome, bilateral upper limbs: Secondary | ICD-10-CM | POA: Diagnosis not present

## 2021-04-19 NOTE — Progress Notes (Signed)
[place]  [address] [place],  [place]  [address] [phone]  Clinic Day:  04/26/2021  Referring physician: Townsend Roger, MD  This document serves as a record of services personally performed by Hosie Poisson, MD. It was created on their behalf by [facility], a trained medical scribe. The creation of this record is based on the scribe's personal observations and the provider's statements to them.  CHIEF COMPLAINT:  CC: Essential thrombocythemia  Current Treatment:   Hydroxyurea 500 mg 3 times daily   HISTORY OF PRESENT ILLNESS:  Allison Stevens is a 55 y.o. female with a history of essential thrombocythemia originally diagnosed in January 2005.  This has been treated with hydroxyurea, but the patient has often been non-compliant.  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  She has persistent pain in the left upper quadrant, attributed to splenomegaly, as well as bone pain, for which she remains on OxyContin with oxycodone for breakthrough.  We tapered her opioids down as much as possible over time.    She saw Dr. Lacinda Axon, her gastroenterologist at [facility], and underwent an esophageal dilatation in August 2016.  She had already had a colonoscopy due to previous gastrointestinal complaints.  We have been following a lesion in the pancreas seen incidentally on CT done in the emergency room for abdominal pain.  This has been felt to be a benign cyst.  Repeat MRI abdomen in November 2016, revealed a slight increase in the benign appearing cyst of the pancreas.  No splenomegaly was seen.  Repeat MRI abdomen in 1 year was recommended.  She was hospitalized in September 2017 and underwent colonoscopy with removal of a small polyp in the sigmoid colon.  Small internal hemorrhoids were also seen.  Dr. Lyndel Safe also obtained a CEA and CA 19-9, and. the CA 19-9 was elevated at 145, the CEA was normal.  Repeat MRI abdomen with  contrast in September 2017 revealed a stable 14 mm cystic lesion in the pancreas.  Again, no splenomegaly was seen.  Follow-up MRI in 1 year was recommended.  Repeat MRI abdomen in March 2019 revealed a 17 mm cystic lesion in the pancreatic uncinate process, which was felt to be stable.  No significant liver lesions were seen.  Repeat MRI abdomen in 1 year was recommended.  Repeat MRI abdomen in September 2019 revealed a stable 19 mm cystic lesion of the pancreas, which appeared to be benign and still felt to be stable.  Repeat MRI abdomen with and without contrast in 1 year was recommended.  MRI abdomen in October 2020 revealed a stable 17 mm cystic lesion of the pancreas.  Continued yearly follow up MRI abdomen was recommended. At her visit in June 2020, we tried decreasing her OxyContin 30 mg every 12 hours, but then increased back to 40 mg every 12 hours, as her pain was not controlled.  She has since continued the same narcotic regimen.   Screening mammogram in February 2016 revealed a possible mass in the right breast.  Right diagnostic mammogram and ultrasound was done and revealed 2 well circumscribed hypoechoic lesions in the right breast at 11 o 'clock, felt to represent cysts.  Repeat mammogram and ultrasound in August revealed heterogeneous fibroglandular tissue felt to be benign, with resolution of the previously seen cysts and no masses.  Repeat diagnostic bilateral mammogram and ultrasound in 6 months was once again recommended.  Bilateral diagnostic mammogram in March 2017  did not reveal any evidence of malignancy, so 1 year follow-up screening mammogram was recommended.  Bilateral screening mammogram in March 2018 did not reveal any evidence of malignancy.  Bone density scan in June 2018 revealed osteopenia with a T-score of -1.5 in the femur.  The patient was instructed to take calcium and vitamin-D twice daily.  The patient's family history is significant in that her father had pancreatic cancer  at age 42, a paternal uncle had pancreatic cancer and a paternal aunt had pancreatic cancer.  Her brother also had pancreatic cancer at age 27.  She is appropriate for genetic testing, but this has not been done yet due to insurance coverage.  Bilateral screening mammogram in December 2020 did not reveal any evidence of malignancy.   Repeat MRI abdomen in September 2021 revealed a 1.9 cm cystic lesion in the pancreatic uncinate process, which showed no significant change compared to more recent exams, but has shown a slow increase in size since 2016. 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 Follow-up by MRI in 2 years was recommended. She remains on 40 mg every 12 hours with oxycodone 10 mg every 4 hours as needed for breakthrough pain. She reports having another stroke in May 2022.  She then had COVID later in May and was hospitalized due to this.  She states her husband has just been diagnosed with kidney failure and they are doing his dialysis at home.  INTERVAL HISTORY:  Allison Stevens is here for routine follow up and states that she is doing well and denies complaints. She continues Hydrea 500 mg QID. Hemoglobin is stable at 10.0, platelets remain normal at 328,000, and white count is normal. I advised that she decrease the Hydrea to 3 times daily. Chemistries are unremarkable. Non-fasting blood glucose is 244. Her  appetite is good, and she has gained 8 pounds since her last visit.  She denies fever, chills or other signs of infection.  She denies nausea, vomiting, bowel issues, or abdominal pain.  She denies sore throat, cough, dyspnea, or chest pain.  REVIEW OF SYSTEMS:  Review of Systems  Constitutional: Negative.  Negative for appetite change, chills, fatigue, fever and unexpected weight change.  HENT:  Negative.    Eyes: Negative.   Respiratory: Negative.  Negative for chest tightness, cough, hemoptysis, shortness of breath  and wheezing.   Cardiovascular: Negative.  Negative for chest pain, leg swelling and palpitations.  Gastrointestinal: Negative.  Negative for abdominal distention, abdominal pain, blood in stool, constipation, diarrhea, nausea and vomiting.  Endocrine: Negative.   Genitourinary: Negative.  Negative for difficulty urinating, dysuria, frequency and hematuria.   Musculoskeletal: Negative.  Negative for arthralgias, back pain, flank pain, gait problem and myalgias.  Skin: Negative.   Neurological: Negative.  Negative for dizziness, extremity weakness, gait problem, headaches, light-headedness, numbness, seizures and speech difficulty.  Hematological: Negative.   Psychiatric/Behavioral: Negative.  Negative for depression and sleep disturbance. The patient is not nervous/anxious.     VITALS:  Blood pressure (!) 196/95, pulse 83, temperature 98.2 F (36.8 C), temperature source Oral, resp. rate 18, height 5' 3.5" (1.613 m), weight 151 lb 8 oz (68.7 kg), SpO2 99 %.  Wt Readings from Last 3 Encounters:  04/26/21 151 lb 8 oz (68.7 kg)  01/28/21 143 lb 1.6 oz (64.9 kg)  11/16/20 146 lb 11.2 oz (66.5 kg)    Body mass index is 26.42 kg/m.  Performance status (ECOG): 0 - Asymptomatic  PHYSICAL EXAM:  Physical Exam Constitutional:      General: She is not in acute distress.    Appearance: Normal appearance. She is normal weight.  HENT:     Head: Normocephalic and atraumatic.  Eyes:     General: No scleral icterus.    Extraocular Movements: Extraocular movements intact.     Conjunctiva/sclera: Conjunctivae normal.     Pupils: Pupils are equal, round, and reactive to light.  Cardiovascular:     Rate and Rhythm: Normal rate and regular rhythm.     Pulses: Normal pulses.     Heart sounds: Normal heart sounds. No murmur heard.   No friction rub. No gallop.  Pulmonary:     Effort: Pulmonary effort is normal. No respiratory distress.     Breath sounds: Normal breath sounds.  Abdominal:      General: Bowel sounds are normal. There is no distension.     Palpations: Abdomen is soft. There is no hepatomegaly, splenomegaly or mass.     Tenderness: There is abdominal tenderness in the left upper quadrant.  Musculoskeletal:        General: Normal range of motion.     Cervical back: Normal range of motion and neck supple.     Right lower leg: No edema.     Left lower leg: No edema.  Lymphadenopathy:     Cervical: No cervical adenopathy.  Skin:    General: Skin is warm and dry.  Neurological:     General: No focal deficit present.     Mental Status: She is alert and oriented to person, place, and time. Mental status is at baseline.  Psychiatric:        Mood and Affect: Mood normal.        Behavior: Behavior normal.        Thought Content: Thought content normal.        Judgment: Judgment normal.   LABS:   CBC Latest Ref Rng & Units 04/26/2021 01/28/2021 07/17/2020  WBC - 4.5 4.3 5.4  Hemoglobin 12.0 - 16.0 10.0(A) 10.1(A) 10.7(A)  Hematocrit 36 - 46 29(A) 31(A) 32(A)  Platelets 150 - 399 328 362 455(A)   CMP Latest Ref Rng & Units 04/26/2021 01/28/2021 07/17/2020  BUN 4 - '21 9 7 10  '$ Creatinine 0.5 - 1.1 0.8 0.6 0.7  Sodium 137 - 147 140 139 142  Potassium 3.4 - 5.3 3.9 3.9 3.6  Chloride 99 - 108 106 105 107  CO2 13 - 22 25(A) 25(A) 29(A)  Calcium 8.7 - 10.7 9.1 9.2 9.4  Alkaline Phos 25 - 125 67 70 82  AST 13 - 35 36(A) 41(A) 39(A)  ALT 7 - 35 26 24 49(A)    STUDIES:  No results found.    HISTORY:   Allergies:  Allergies  Allergen Reactions   Iodinated Diagnostic Agents Other (See Comments) and Shortness Of Breath    Current Medications: Current Outpatient Medications  Medication Sig Dispense Refill   aspirin 81 MG chewable tablet Chew 1 tablet by mouth daily.     ALPRAZolam (XANAX) 0.5 MG tablet Take 0.5 mg by mouth at bedtime as needed for anxiety.     amLODipine (NORVASC) 10 MG tablet Take 10 mg by mouth daily.     carvedilol (COREG) 12.5 MG tablet Take  12.5 mg by mouth 2 (two) times daily.     clopidogrel (PLAVIX) 75 MG tablet Take 1 tablet (75 mg total) by mouth daily. 30 tablet 0   diclofenac  Sodium (VOLTAREN) 1 % GEL Apply 4 g topically 4 (four) times daily.     enalapril (VASOTEC) 10 MG tablet Take 10 mg by mouth 2 (two) times daily.     escitalopram (LEXAPRO) 10 MG tablet Take 10 mg by mouth daily.     glimepiride (AMARYL) 2 MG tablet Take by mouth.     hydrochlorothiazide (HYDRODIURIL) 12.5 MG tablet Take 12.5 mg by mouth daily.     hydroxyurea (HYDREA) 500 MG capsule Take 500 mg by mouth 3 (three) times daily. May take with food to minimize GI side effects.     ibuprofen (ADVIL,MOTRIN) 800 MG tablet Take 800 mg by mouth every 8 (eight) hours as needed.     insulin aspart (NOVOLOG) 100 UNIT/ML injection Inject 34 Units into the skin 3 (three) times daily before meals.     Insulin Detemir (LEVEMIR) 100 UNIT/ML Pen Inject into the skin.     metFORMIN (GLUCOPHAGE) 1000 MG tablet Take 1,000 mg by mouth 2 (two) times daily with a meal.     metoCLOPramide (REGLAN) 5 MG tablet Take 5 mg by mouth every 8 (eight) hours.     montelukast (SINGULAIR) 10 MG tablet Take 10 mg by mouth at bedtime.     niacin 500 MG tablet Take 500 mg by mouth at bedtime.     ondansetron (ZOFRAN-ODT) 4 MG disintegrating tablet TAKE 1 TABLET BY MOUTH EVERY 6 HOURS AS NEEDED FOR NAUSEA/VOMITING     oxyCODONE (OXYCONTIN) 40 mg 12 hr tablet Take 1 tablet (40 mg total) by mouth every 12 (twelve) hours. 60 tablet 0   Oxycodone HCl 10 MG TABS Take 1 tablet (10 mg total) by mouth every 4 (four) hours as needed (pain). 120 tablet 0   pantoprazole (PROTONIX) 40 MG tablet Take 1 tablet (40 mg total) by mouth daily. 30 tablet 6   Potassium Chloride ER 20 MEQ TBCR Take 1 tablet by mouth daily.     pravastatin (PRAVACHOL) 40 MG tablet Take 40 mg by mouth daily.     prochlorperazine (COMPAZINE) 10 MG tablet Take 10 mg by mouth every 6 (six) hours as needed for nausea or vomiting.      sertraline (ZOLOFT) 25 MG tablet Take 25 mg by mouth daily.     spironolactone (ALDACTONE) 25 MG tablet Take 0.5 tablets by mouth daily.     TOUJEO MAX SOLOSTAR 300 UNIT/ML Solostar Pen SMARTSIG:80 Unit(s) SUB-Q Every Night     TRULICITY 4.5 0000000 SOPN SMARTSIG:1 Pre-Filled Pen Syringe SUB-Q Once a Week     No current facility-administered medications for this visit.     ASSESSMENT & PLAN:   Assessment:  1. Essential thrombocythemia, well controlled with hydroxyurea 500 mg, which she is taking 4 times daily.  I advised that she go down to 3 daily as her platelets remain in a good range. She is on chronic opioids due to bone pain.     2. First-degree relatives with pancreatic cancer.  Genetic testing has been recommended, but not done due to lack of insurance coverage. Her brother was diagnosed with pancreatic cancer.   3. Uncontrolled hypertension .  She monitors this at home and continues to follow with Dr. Manson Allan.  4. 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 This has slowly increased since 2016, but was stable on last imaging in September 2021. We will repeat abdominal MRI imaging now since it has been 1 year.  Plan:    She will continue hydroxyurea 500  mg, but will decrease to TID.  I will order abdominal MRI and bilateral mammogram now, and I will call her with the results. We will plan to see her back in 3 months with a CBC and comprehensive metabolic panel for repeat clinical assessment. The patient understands the plans discussed today and is in agreement with them.  She knows to contact our office if she develops concerns prior to her next appointment.   I, Rita Ohara, am acting as scribe for Derwood Kaplan, MD  I have reviewed this report as typed by the medical scribe, and it is complete and accurate.

## 2021-04-22 ENCOUNTER — Other Ambulatory Visit: Payer: Self-pay

## 2021-04-22 DIAGNOSIS — R161 Splenomegaly, not elsewhere classified: Secondary | ICD-10-CM

## 2021-04-22 DIAGNOSIS — M898X9 Other specified disorders of bone, unspecified site: Secondary | ICD-10-CM

## 2021-04-22 MED ORDER — OXYCODONE HCL ER 40 MG PO T12A: 40.0000 mg | EXTENDED_RELEASE_TABLET | Freq: Two times a day (BID) | ORAL | 0 refills | Status: DC

## 2021-04-22 MED ORDER — OXYCODONE HCL 10 MG PO TABS: 10.0000 mg | ORAL_TABLET | ORAL | 0 refills | Status: DC | PRN

## 2021-04-25 ENCOUNTER — Other Ambulatory Visit: Payer: Self-pay | Admitting: Oncology

## 2021-04-25 DIAGNOSIS — D473 Essential (hemorrhagic) thrombocythemia: Secondary | ICD-10-CM

## 2021-04-26 ENCOUNTER — Other Ambulatory Visit: Payer: Self-pay | Admitting: Oncology

## 2021-04-26 ENCOUNTER — Inpatient Hospital Stay (INDEPENDENT_AMBULATORY_CARE_PROVIDER_SITE_OTHER): Payer: Medicare Other | Admitting: Oncology

## 2021-04-26 ENCOUNTER — Telehealth: Payer: Self-pay | Admitting: Oncology

## 2021-04-26 ENCOUNTER — Inpatient Hospital Stay: Payer: Medicare Other | Attending: Oncology

## 2021-04-26 ENCOUNTER — Other Ambulatory Visit: Payer: Self-pay | Admitting: Hematology and Oncology

## 2021-04-26 ENCOUNTER — Encounter: Payer: Self-pay | Admitting: Oncology

## 2021-04-26 VITALS — BP 196/95 | HR 83 | Temp 98.2°F | Resp 18 | Ht 63.5 in | Wt 151.5 lb

## 2021-04-26 DIAGNOSIS — K862 Cyst of pancreas: Secondary | ICD-10-CM

## 2021-04-26 DIAGNOSIS — D649 Anemia, unspecified: Secondary | ICD-10-CM | POA: Diagnosis not present

## 2021-04-26 DIAGNOSIS — D473 Essential (hemorrhagic) thrombocythemia: Secondary | ICD-10-CM

## 2021-04-26 DIAGNOSIS — Z1231 Encounter for screening mammogram for malignant neoplasm of breast: Secondary | ICD-10-CM

## 2021-04-26 LAB — CBC AND DIFFERENTIAL
HCT: 29 — AB (ref 36–46)
Hemoglobin: 10 — AB (ref 12.0–16.0)
Neutrophils Absolute: 3.11
Platelets: 328 (ref 150–399)
WBC: 4.5

## 2021-04-26 LAB — HEPATIC FUNCTION PANEL
ALT: 26 (ref 7–35)
AST: 36 — AB (ref 13–35)
Alkaline Phosphatase: 67 (ref 25–125)
Bilirubin, Total: 0.5

## 2021-04-26 LAB — BASIC METABOLIC PANEL
BUN: 9 (ref 4–21)
CO2: 25 — AB (ref 13–22)
Chloride: 106 (ref 99–108)
Creatinine: 0.8 (ref 0.5–1.1)
Glucose: 244
Potassium: 3.9 (ref 3.4–5.3)
Sodium: 140 (ref 137–147)

## 2021-04-26 LAB — COMPREHENSIVE METABOLIC PANEL
Albumin: 4.1 (ref 3.5–5.0)
Calcium: 9.1 (ref 8.7–10.7)

## 2021-04-26 LAB — CBC: RBC: 3.25 — AB (ref 3.87–5.11)

## 2021-04-26 NOTE — Telephone Encounter (Signed)
Per 9/26 LOS, patient scheduled for Jan 2023 Appt's.  Gave patient Appt Summary Sending Mammo/MRI order to scheduling

## 2021-04-30 ENCOUNTER — Other Ambulatory Visit: Payer: Medicare Other

## 2021-04-30 ENCOUNTER — Ambulatory Visit: Payer: Medicare Other | Admitting: Oncology

## 2021-04-30 ENCOUNTER — Telehealth: Payer: Self-pay | Admitting: *Deleted

## 2021-04-30 NOTE — Telephone Encounter (Signed)
Patient no showed PV today at 230 pm- Called patient  at 225 pm called again at 230 pm and again at 240 pm  and  at 240 pm left message to return call by 5 pm today - If no call by 5 pm, PV and procedure will be canceled - no call at 5 pm -  PV and Procedure both canceled- No Show letter mailed to patient

## 2021-05-06 DIAGNOSIS — G5621 Lesion of ulnar nerve, right upper limb: Secondary | ICD-10-CM | POA: Diagnosis not present

## 2021-05-14 ENCOUNTER — Encounter: Payer: Self-pay | Admitting: Oncology

## 2021-05-17 DIAGNOSIS — S93401A Sprain of unspecified ligament of right ankle, initial encounter: Secondary | ICD-10-CM | POA: Diagnosis not present

## 2021-05-17 DIAGNOSIS — M79671 Pain in right foot: Secondary | ICD-10-CM | POA: Diagnosis not present

## 2021-05-20 ENCOUNTER — Other Ambulatory Visit: Payer: Self-pay

## 2021-05-20 DIAGNOSIS — R161 Splenomegaly, not elsewhere classified: Secondary | ICD-10-CM

## 2021-05-20 DIAGNOSIS — M898X9 Other specified disorders of bone, unspecified site: Secondary | ICD-10-CM

## 2021-05-20 MED ORDER — OXYCODONE HCL 10 MG PO TABS: 10.0000 mg | ORAL_TABLET | ORAL | 0 refills | Status: DC | PRN

## 2021-05-20 MED ORDER — OXYCODONE HCL ER 40 MG PO T12A
40.0000 mg | EXTENDED_RELEASE_TABLET | Freq: Two times a day (BID) | ORAL | 0 refills | Status: DC
Start: 2021-05-20 — End: 2021-06-16

## 2021-05-25 ENCOUNTER — Other Ambulatory Visit: Payer: Self-pay | Admitting: Orthopedic Surgery

## 2021-05-25 DIAGNOSIS — G5621 Lesion of ulnar nerve, right upper limb: Secondary | ICD-10-CM | POA: Diagnosis not present

## 2021-05-25 DIAGNOSIS — M65332 Trigger finger, left middle finger: Secondary | ICD-10-CM | POA: Diagnosis not present

## 2021-05-25 DIAGNOSIS — M65331 Trigger finger, right middle finger: Secondary | ICD-10-CM | POA: Diagnosis not present

## 2021-05-25 DIAGNOSIS — G5622 Lesion of ulnar nerve, left upper limb: Secondary | ICD-10-CM | POA: Diagnosis not present

## 2021-05-31 DIAGNOSIS — Z1331 Encounter for screening for depression: Secondary | ICD-10-CM | POA: Diagnosis not present

## 2021-05-31 DIAGNOSIS — K219 Gastro-esophageal reflux disease without esophagitis: Secondary | ICD-10-CM | POA: Diagnosis not present

## 2021-05-31 DIAGNOSIS — Z6826 Body mass index (BMI) 26.0-26.9, adult: Secondary | ICD-10-CM | POA: Diagnosis not present

## 2021-05-31 DIAGNOSIS — E782 Mixed hyperlipidemia: Secondary | ICD-10-CM | POA: Diagnosis not present

## 2021-05-31 DIAGNOSIS — E11319 Type 2 diabetes mellitus with unspecified diabetic retinopathy without macular edema: Secondary | ICD-10-CM | POA: Diagnosis not present

## 2021-05-31 DIAGNOSIS — D693 Immune thrombocytopenic purpura: Secondary | ICD-10-CM | POA: Diagnosis not present

## 2021-05-31 DIAGNOSIS — E1169 Type 2 diabetes mellitus with other specified complication: Secondary | ICD-10-CM | POA: Diagnosis not present

## 2021-05-31 DIAGNOSIS — I10 Essential (primary) hypertension: Secondary | ICD-10-CM | POA: Diagnosis not present

## 2021-05-31 DIAGNOSIS — E113213 Type 2 diabetes mellitus with mild nonproliferative diabetic retinopathy with macular edema, bilateral: Secondary | ICD-10-CM | POA: Diagnosis not present

## 2021-05-31 DIAGNOSIS — F419 Anxiety disorder, unspecified: Secondary | ICD-10-CM | POA: Diagnosis not present

## 2021-05-31 DIAGNOSIS — Z0000 Encounter for general adult medical examination without abnormal findings: Secondary | ICD-10-CM | POA: Diagnosis not present

## 2021-05-31 DIAGNOSIS — M6530 Trigger finger, unspecified finger: Secondary | ICD-10-CM | POA: Diagnosis not present

## 2021-06-03 ENCOUNTER — Encounter: Payer: Medicare Other | Admitting: Gastroenterology

## 2021-06-03 DIAGNOSIS — M5431 Sciatica, right side: Secondary | ICD-10-CM | POA: Diagnosis not present

## 2021-06-07 ENCOUNTER — Encounter: Payer: Self-pay | Admitting: Hematology and Oncology

## 2021-06-07 ENCOUNTER — Inpatient Hospital Stay: Payer: Medicare Other | Attending: Oncology

## 2021-06-07 DIAGNOSIS — E11319 Type 2 diabetes mellitus with unspecified diabetic retinopathy without macular edema: Secondary | ICD-10-CM | POA: Diagnosis not present

## 2021-06-07 DIAGNOSIS — D649 Anemia, unspecified: Secondary | ICD-10-CM | POA: Diagnosis not present

## 2021-06-07 LAB — BASIC METABOLIC PANEL
BUN: 16 (ref 4–21)
CO2: 31 — AB (ref 13–22)
Chloride: 104 (ref 99–108)
Creatinine: 0.8 (ref 0.5–1.1)
Glucose: 118
Potassium: 4.1 (ref 3.4–5.3)
Sodium: 144 (ref 137–147)

## 2021-06-07 LAB — HEPATIC FUNCTION PANEL
ALT: 33 (ref 7–35)
AST: 39 — AB (ref 13–35)
Alkaline Phosphatase: 78 (ref 25–125)
Bilirubin, Total: 0.4

## 2021-06-07 LAB — LIPID PANEL
Cholesterol: 207 — AB (ref 0–200)
HDL: 48 (ref 35–70)
LDL Cholesterol: 144
Triglycerides: 77 (ref 40–160)

## 2021-06-07 LAB — CBC AND DIFFERENTIAL
HCT: 34 — AB (ref 36–46)
Hemoglobin: 11.6 — AB (ref 12.0–16.0)
Neutrophils Absolute: 6.39
Platelets: 442 — AB (ref 150–399)
WBC: 7.7

## 2021-06-07 LAB — HEMOGLOBIN A1C: Hemoglobin A1C: 6.8

## 2021-06-07 LAB — COMPREHENSIVE METABOLIC PANEL
Albumin: 4.4 (ref 3.5–5.0)
Calcium: 9.4 (ref 8.7–10.7)

## 2021-06-07 LAB — CBC: RBC: 3.77 — AB (ref 3.87–5.11)

## 2021-06-08 DIAGNOSIS — M25551 Pain in right hip: Secondary | ICD-10-CM | POA: Diagnosis not present

## 2021-06-08 DIAGNOSIS — Z1231 Encounter for screening mammogram for malignant neoplasm of breast: Secondary | ICD-10-CM | POA: Diagnosis not present

## 2021-06-08 DIAGNOSIS — M5431 Sciatica, right side: Secondary | ICD-10-CM | POA: Diagnosis not present

## 2021-06-16 ENCOUNTER — Other Ambulatory Visit: Payer: Self-pay

## 2021-06-16 DIAGNOSIS — M898X9 Other specified disorders of bone, unspecified site: Secondary | ICD-10-CM

## 2021-06-16 DIAGNOSIS — R161 Splenomegaly, not elsewhere classified: Secondary | ICD-10-CM

## 2021-06-16 MED ORDER — OXYCODONE HCL ER 40 MG PO T12A: 40.0000 mg | EXTENDED_RELEASE_TABLET | Freq: Two times a day (BID) | ORAL | 0 refills | Status: DC

## 2021-06-16 MED ORDER — OXYCODONE HCL 10 MG PO TABS: 10.0000 mg | ORAL_TABLET | ORAL | 0 refills | Status: DC | PRN

## 2021-06-21 DIAGNOSIS — S93401A Sprain of unspecified ligament of right ankle, initial encounter: Secondary | ICD-10-CM | POA: Diagnosis not present

## 2021-06-21 DIAGNOSIS — M79671 Pain in right foot: Secondary | ICD-10-CM | POA: Diagnosis not present

## 2021-07-08 ENCOUNTER — Other Ambulatory Visit: Payer: Self-pay | Admitting: Hematology and Oncology

## 2021-07-08 DIAGNOSIS — D473 Essential (hemorrhagic) thrombocythemia: Secondary | ICD-10-CM

## 2021-07-16 ENCOUNTER — Other Ambulatory Visit: Payer: Self-pay

## 2021-07-16 DIAGNOSIS — M898X9 Other specified disorders of bone, unspecified site: Secondary | ICD-10-CM

## 2021-07-16 DIAGNOSIS — R161 Splenomegaly, not elsewhere classified: Secondary | ICD-10-CM

## 2021-07-16 MED ORDER — OXYCODONE HCL 10 MG PO TABS: 10.0000 mg | ORAL_TABLET | ORAL | 0 refills | Status: DC | PRN

## 2021-07-16 MED ORDER — OXYCODONE HCL ER 40 MG PO T12A: 40.0000 mg | EXTENDED_RELEASE_TABLET | Freq: Two times a day (BID) | ORAL | 0 refills | Status: DC

## 2021-07-22 ENCOUNTER — Encounter (HOSPITAL_BASED_OUTPATIENT_CLINIC_OR_DEPARTMENT_OTHER): Payer: Self-pay | Admitting: Orthopedic Surgery

## 2021-07-22 ENCOUNTER — Other Ambulatory Visit: Payer: Self-pay

## 2021-08-04 NOTE — Progress Notes (Signed)
Sent text reminding pt to come in for lab work.  

## 2021-08-05 ENCOUNTER — Ambulatory Visit (HOSPITAL_BASED_OUTPATIENT_CLINIC_OR_DEPARTMENT_OTHER): Admission: RE | Admit: 2021-08-05 | Payer: Medicare Other | Source: Home / Self Care | Admitting: Orthopedic Surgery

## 2021-08-05 DIAGNOSIS — I10 Essential (primary) hypertension: Secondary | ICD-10-CM

## 2021-08-05 SURGERY — ULNAR NERVE DECOMPRESSION/TRANSPOSITION
Anesthesia: Choice | Laterality: Left

## 2021-08-06 ENCOUNTER — Other Ambulatory Visit: Payer: Self-pay

## 2021-08-09 ENCOUNTER — Inpatient Hospital Stay (INDEPENDENT_AMBULATORY_CARE_PROVIDER_SITE_OTHER): Payer: Medicare Other | Admitting: Hematology and Oncology

## 2021-08-09 ENCOUNTER — Telehealth: Payer: Self-pay | Admitting: Oncology

## 2021-08-09 ENCOUNTER — Encounter: Payer: Self-pay | Admitting: Hematology and Oncology

## 2021-08-09 ENCOUNTER — Inpatient Hospital Stay: Payer: Medicare Other | Attending: Oncology

## 2021-08-09 ENCOUNTER — Other Ambulatory Visit: Payer: Self-pay

## 2021-08-09 DIAGNOSIS — K862 Cyst of pancreas: Secondary | ICD-10-CM

## 2021-08-09 DIAGNOSIS — D473 Essential (hemorrhagic) thrombocythemia: Secondary | ICD-10-CM

## 2021-08-09 DIAGNOSIS — Z800 Family history of malignant neoplasm of digestive organs: Secondary | ICD-10-CM

## 2021-08-09 DIAGNOSIS — I10 Essential (primary) hypertension: Secondary | ICD-10-CM

## 2021-08-09 HISTORY — DX: Family history of malignant neoplasm of digestive organs: Z80.0

## 2021-08-09 HISTORY — DX: Cyst of pancreas: K86.2

## 2021-08-09 LAB — CBC AND DIFFERENTIAL
HCT: 32 — AB (ref 36–46)
Hemoglobin: 10.4 — AB (ref 12.0–16.0)
Neutrophils Absolute: 3.55
Platelets: 343 (ref 150–399)
WBC: 5

## 2021-08-09 LAB — COMPREHENSIVE METABOLIC PANEL
Albumin: 4.1 (ref 3.5–5.0)
Calcium: 9.1 (ref 8.7–10.7)

## 2021-08-09 LAB — HEPATIC FUNCTION PANEL
ALT: 34 (ref 7–35)
AST: 36 — AB (ref 13–35)
Alkaline Phosphatase: 77 (ref 25–125)
Bilirubin, Total: 0.5

## 2021-08-09 LAB — BASIC METABOLIC PANEL
BUN: 9 (ref 4–21)
CO2: 26 — AB (ref 13–22)
Chloride: 109 — AB (ref 99–108)
Creatinine: 0.8 (ref 0.5–1.1)
Glucose: 152
Potassium: 4.2 (ref 3.4–5.3)
Sodium: 140 (ref 137–147)

## 2021-08-09 LAB — CBC: RBC: 3.45 — AB (ref 3.87–5.11)

## 2021-08-09 NOTE — Assessment & Plan Note (Signed)
Poorly controlled hypertension, she continues to follow with Dr. Manson Allan.

## 2021-08-09 NOTE — Progress Notes (Signed)
[facility]  [address] [place],  [name]  [address] [phone]  Clinic Day:  08/09/2021  Referring physician: Townsend Roger, MD  ASSESSMENT & PLAN:   Assessment & Plan: Essential thrombocytosis ([place]) Essential thrombocythemia, well controlled with hydroxyurea 500 mg 3 times a day, which she will continue. She is on chronic opioids due to bone pain. We will plan to see her back in 3 months with a CBC and comprehensive metabolic panel for repeat clinical assessment.  Family history of pancreatic cancer First-degree relative, brother, with pancreatic cancer.  He declined genetic testing.  Genetic testing has been recommended for the patient, but not done due to lack of insurance coverage.  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 This has slowly increased since 201.  MRI abdomen was recommended by Dr. Hinton Rao in September, but as her last MRI in September 2021 was stable, follow up MRI in 2 years was recommended.  Hypertension Poorly controlled hypertension, she continues to follow with Dr. Manson Allan.   The patient understands the plans discussed today and is in agreement with them.  She knows to contact our office if she develops concerns prior to her next appointment.     [name], PA-C  [facility] AT [facility] [address] [place] [address] Dept: [phone] Dept Fax: [phone]   No orders of the defined types were placed in this encounter.     CHIEF COMPLAINT:  CC: Essential thrombocythemia  Current Treatment:  Hydroxyurea 3 times daily   HISTORY OF PRESENT ILLNESS:  Allison Stevens is a 56 y.o. female with a history of essential thrombocythemia originally diagnosed in January 2005.  This has been treated with hydroxyurea, but the patient has often been non-compliant.  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  She has persistent pain in the left upper quadrant, attributed to splenomegaly, as well as bone pain, for which she remains on OxyContin with oxycodone for breakthrough.  We tapered her opioids down as much as possible over time.    She saw Dr. Lacinda Axon, her gastroenterologist at [facility], and underwent an esophageal dilatation in August 2016.  She had already had a colonoscopy due to previous gastrointestinal complaints.  We have been following a lesion in the pancreas seen incidentally on CT done in the emergency room for abdominal pain.  This has been felt to be a benign cyst.  Repeat MRI abdomen in November 2016, revealed a slight increase in the benign appearing cyst of the pancreas.  No splenomegaly was seen.  Repeat MRI abdomen in 1 year was recommended.  She was hospitalized in September 2017 and underwent colonoscopy with removal of a small polyp in the sigmoid colon.  Small internal hemorrhoids were also seen.  Dr. Lyndel Safe also obtained a CEA and CA 19-9, and. the CA 19-9 was elevated at 145, the CEA was normal.  Repeat MRI abdomen with contrast in September 2017 revealed a stable 14 mm cystic lesion in the pancreas.  Again, no splenomegaly was seen.  Follow-up MRI in 1 year was recommended.  Repeat MRI abdomen in March 2019 revealed a 17 mm cystic lesion in the pancreatic uncinate process, which was felt to be stable.  No significant liver lesions were seen.  Repeat MRI abdomen in 1 year was recommended.  Repeat MRI abdomen in September 2019 revealed a stable 19 mm cystic lesion  of the pancreas, which appeared to be benign and still felt to be stable.  Repeat MRI abdomen with and without contrast in 1 year was recommended.  MRI abdomen in October 2020 revealed a stable 17 mm cystic lesion of the pancreas.  Continued yearly follow up MRI abdomen was recommended. At her visit in June 2020, we tried decreasing her OxyContin 30 mg every 12 hours, but then increased back to 40 mg every  12 hours, as her pain was not controlled.  She has since continued the same narcotic regimen.   Screening mammogram in February 2016 revealed a possible mass in the right breast.  Right diagnostic mammogram and ultrasound was done and revealed 2 well circumscribed hypoechoic lesions in the right breast at 11 o 'clock, felt to represent cysts.  Repeat mammogram and ultrasound in August revealed heterogeneous fibroglandular tissue felt to be benign, with resolution of the previously seen cysts and no masses.  Repeat diagnostic bilateral mammogram and ultrasound in 6 months was once again recommended.  Bilateral diagnostic mammogram in March 2017 did not reveal any evidence of malignancy, so 1 year follow-up screening mammogram was recommended.  Bilateral screening mammogram in March 2018 did not reveal any evidence of malignancy.  Bone density scan in June 2018 revealed osteopenia with a T-score of -1.5 in the femur.  The patient was instructed to take calcium and vitamin-D twice daily.  The patient's family history is significant in that her father had pancreatic cancer at age 89, a paternal uncle had pancreatic cancer and a paternal aunt had pancreatic cancer.  Her brother also had pancreatic cancer at age 72.  She is appropriate for genetic testing, but this has not been done yet due to insurance coverage.  Bilateral screening mammogram in December 2020 did not reveal any evidence of malignancy.    Repeat MRI abdomen in September 2021 revealed a 1.9 cm cystic lesion in the pancreatic uncinate process, which showed no significant change compared to more recent exams, but has shown a slow increase in size since 2016. 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 Follow-up by MRI in 2 years was recommended. She remains on Oxycontin 40 mg every 12 hours with oxycodone 10 mg every 4 hours as needed for breakthrough pain. She reports having another  stroke in May 2022.  She then had COVID later in May and was hospitalized due to this.  She had been on hydroxyurea 4 times a day, but this was reduced in September to 3 times a day as her platelets were controlled and she was more anemic with a hemoglobin of 10. Repeat CBC in November revealed her hemoglobin to be back up to 11.6.  INTERVAL HISTORY:  Allison Stevens is here today for repeat clinical assessment and states she has been doing relatively well since her last visit.  She states she continues hydroxyurea at least 3 times daily, and occasionally for, as she forgets.  She has had several falls due to balance difficulties, with last 1 being yesterday.  She has discomfort of her right hip, but is able to ambulate without difficulty, so does not wish to have an x-ray.  She denies other neurologic symptoms, such as headaches, vision changes, new paresthesias or weakness.  She states she has been out of her Plavix for about 1 month, but does not remember which doctor prescribed this.  I asked her to contact Dr. Manson Allan for this.  She states she has  recently been on a Medrol dose pack for right trigger finger.  She denies fevers or chills. She denies pain. Her appetite is good. Her weight has increased 4 pounds over last 3 months .  She had a bilateral screening mammogram in November that did not reveal any evidence of malignancy.  She is disabled due to her multiple strokes, as well as risk for future thrombosis.  REVIEW OF SYSTEMS:  Review of Systems  Constitutional:  Negative for appetite change, chills, fatigue, fever and unexpected weight change.  HENT:   Negative for lump/mass, mouth sores and sore throat.   Respiratory:  Negative for cough and shortness of breath.   Cardiovascular:  Negative for chest pain and leg swelling.  Gastrointestinal:  Negative for abdominal pain, constipation, diarrhea, nausea and vomiting.  Endocrine: Negative for hot flashes.  Genitourinary:  Negative for difficulty  urinating, dysuria, frequency and hematuria.   Musculoskeletal:  Positive for arthralgias (right hip since fall) and gait problem (limping due to previous stroke). Negative for back pain and myalgias.  Skin:  Negative for rash.  Neurological:  Positive for gait problem (limping due to previous stroke). Negative for dizziness, headaches and numbness.  Hematological:  Negative for adenopathy. Does not bruise/bleed easily.  Psychiatric/Behavioral:  Negative for depression and sleep disturbance. The patient is not nervous/anxious.     VITALS:  Blood pressure (!) 189/85, pulse 80, temperature 98.1 F (36.7 C), temperature source Oral, resp. rate 20, height 5\' 4"  (1.626 m), weight 155 lb 6.4 oz (70.5 kg), SpO2 96 %.  Wt Readings from Last 3 Encounters:  08/09/21 155 lb 6.4 oz (70.5 kg)  04/26/21 151 lb 8 oz (68.7 kg)  01/28/21 143 lb 1.6 oz (64.9 kg)    Body mass index is 26.67 kg/m.  Performance status (ECOG): 1 - Symptomatic but completely ambulatory  PHYSICAL EXAM:  Physical Exam Vitals and nursing note reviewed.  Constitutional:      General: She is not in acute distress.    Appearance: Normal appearance.  HENT:     Head: Normocephalic and atraumatic.     Mouth/Throat:     Mouth: Mucous membranes are moist.     Pharynx: Oropharynx is clear. No oropharyngeal exudate or posterior oropharyngeal erythema.  Eyes:     General: No scleral icterus.    Extraocular Movements: Extraocular movements intact.     Conjunctiva/sclera: Conjunctivae normal.     Pupils: Pupils are equal, round, and reactive to light.  Cardiovascular:     Rate and Rhythm: Normal rate and regular rhythm.     Heart sounds: Normal heart sounds. No murmur heard.   No friction rub. No gallop.  Pulmonary:     Effort: Pulmonary effort is normal.     Breath sounds: Normal breath sounds. No wheezing, rhonchi or rales.  Abdominal:     General: There is no distension.     Palpations: Abdomen is soft. There is no  hepatomegaly, splenomegaly or mass.     Tenderness: There is no abdominal tenderness.  Musculoskeletal:        General: Normal range of motion.     Cervical back: Normal range of motion and neck supple. No tenderness.     Right lower leg: No edema.     Left lower leg: No edema.  Lymphadenopathy:     Cervical: No cervical adenopathy.     Upper Body:     Right upper body: No supraclavicular or axillary adenopathy.     Left upper body:  No supraclavicular or axillary adenopathy.     Lower Body: No right inguinal adenopathy. No left inguinal adenopathy.  Skin:    General: Skin is warm and dry.     Coloration: Skin is not jaundiced.     Findings: No rash.  Neurological:     Mental Status: She is alert and oriented to person, place, and time.     Cranial Nerves: No cranial nerve deficit.  Psychiatric:        Mood and Affect: Mood normal.        Behavior: Behavior normal.        Thought Content: Thought content normal.   LABS:   CBC Latest Ref Rng & Units 08/09/2021 06/07/2021 04/26/2021  WBC - 5.0 7.7 4.5  Hemoglobin 12.0 - 16.0 10.4(A) 11.6(A) 10.0(A)  Hematocrit 36 - 46 32(A) 34(A) 29(A)  Platelets 150 - 399 343 442(A) 328   CMP Latest Ref Rng & Units 08/09/2021 06/07/2021 04/26/2021  BUN 4 - 21 9 16 9   Creatinine 0.5 - 1.1 0.8 0.8 0.8  Sodium 137 - 147 140 144 140  Potassium 3.4 - 5.3 4.2 4.1 3.9  Chloride 99 - 108 109(A) 104 106  CO2 13 - 22 26(A) 31(A) 25(A)  Calcium 8.7 - 10.7 9.1 9.4 9.1  Alkaline Phos 25 - 125 77 78 67  AST 13 - 35 36(A) 39(A) 36(A)  ALT 7 - 35 34 33 26     No results found for: CEA1 / No results found for: CEA1 No results found for: PSA1 No results found for: YNW295 No results found for: AOZ308  No results found for: TOTALPROTELP, ALBUMINELP, A1GS, A2GS, BETS, BETA2SER, GAMS, MSPIKE, SPEI No results found for: TIBC, FERRITIN, IRONPCTSAT No results found for: LDH  STUDIES:  No results found.    Exam(s): [id number] MAM/MAM DIGITAL SCREENING-BILAT   CLINICAL DATA: Screening.  EXAM:  DIGITAL SCREENING BILATERAL MAMMOGRAM WITH CAD  TECHNIQUE:  Bilateral screening digital craniocaudal and mediolateral oblique  mammograms were obtained. The images were evaluated with  computer-aided detection.  COMPARISON: Previous exam(s).  ACR Breast Density Category b: There are scattered areas of  fibroglandular density.  FINDINGS:  There are no findings suspicious for malignancy.  IMPRESSION:  No mammographic evidence of malignancy. A result letter of this  screening mammogram will be mailed directly to the patient.  RECOMMENDATION:  Screening mammogram in one year. (Code:SM-B-01Y)  BI-RADS CATEGORY 1: Negative.   HISTORY:   Past Medical History:  Diagnosis Date   Acid reflux    Diabetes ([place]) 2002   Family history of pancreatic cancer 08/09/2021   Gait abnormality    Hypertension    Migraine    Pancreatic cyst 08/09/2021   Stroke ([place])    Thrombocytosis     Past Surgical History:  Procedure Laterality Date   CESAREAN SECTION  1997   GALLBLADDER SURGERY  2011    Family History  Problem Relation Age of Onset   Diabetes Mother    Cancer Mother    Cancer Father    Healthy Child     Social History:  reports that she has never smoked. She has never used smokeless tobacco. She reports that she does not drink alcohol and does not use drugs.The patient is alone today.  Allergies:  Allergies  Allergen Reactions   Iodinated Contrast Media Other (See Comments) and Shortness Of Breath    Current Medications: Current Outpatient Medications  Medication Sig Dispense Refill   ALPRAZolam (XANAX) 0.5 MG tablet  Take 0.5 mg by mouth at bedtime as needed for anxiety.     amLODipine (NORVASC) 10 MG tablet Take 10 mg by mouth daily.     aspirin 81 MG chewable tablet Chew 1 tablet by mouth daily.     carvedilol (COREG) 12.5 MG tablet Take 12.5 mg by mouth 2 (two) times daily.     clopidogrel (PLAVIX) 75 MG tablet Take 75 mg by mouth daily.  (Patient not taking: Reported on 08/09/2021)     diclofenac Sodium (VOLTAREN) 1 % GEL Apply 4 g topically 4 (four) times daily.     enalapril (VASOTEC) 10 MG tablet Take 10 mg by mouth 2 (two) times daily.     escitalopram (LEXAPRO) 10 MG tablet Take 10 mg by mouth daily.     glimepiride (AMARYL) 2 MG tablet Take by mouth.     hydrochlorothiazide (HYDRODIURIL) 12.5 MG tablet Take 12.5 mg by mouth daily.     hydroxyurea (HYDREA) 500 MG capsule Take 1 capsule (500 mg total) by mouth 3 (three) times daily. 180 capsule 1   ibuprofen (ADVIL,MOTRIN) 800 MG tablet Take 800 mg by mouth every 8 (eight) hours as needed.     Insulin Detemir (LEVEMIR) 100 UNIT/ML Pen Inject into the skin.     lidocaine (XYLOCAINE) 1 % (with preservative) injection by Infiltration route.     metFORMIN (GLUCOPHAGE) 1000 MG tablet Take 1,000 mg by mouth 2 (two) times daily with a meal.     metoCLOPramide (REGLAN) 5 MG tablet Take 5 mg by mouth every 8 (eight) hours.     montelukast (SINGULAIR) 10 MG tablet Take 10 mg by mouth at bedtime.     niacin 500 MG tablet Take 500 mg by mouth at bedtime.     NOVOLOG FLEXPEN 100 UNIT/ML FlexPen SMARTSIG:34 Unit(s) SUB-Q 3 Times Daily     ondansetron (ZOFRAN-ODT) 4 MG disintegrating tablet TAKE 1 TABLET BY MOUTH EVERY 6 HOURS AS NEEDED FOR NAUSEA/VOMITING     oxyCODONE (OXYCONTIN) 40 mg 12 hr tablet Take 1 tablet (40 mg total) by mouth every 12 (twelve) hours. 60 tablet 0   Oxycodone HCl 10 MG TABS Take 1 tablet (10 mg total) by mouth every 4 (four) hours as needed (pain). 120 tablet 0   pantoprazole (PROTONIX) 40 MG tablet Take 1 tablet (40 mg total) by mouth daily. 30 tablet 6   Potassium Chloride ER 20 MEQ TBCR Take 1 tablet by mouth daily.     pravastatin (PRAVACHOL) 40 MG tablet Take 40 mg by mouth daily.     prochlorperazine (COMPAZINE) 10 MG tablet Take 10 mg by mouth every 6 (six) hours as needed for nausea or vomiting.     sertraline (ZOLOFT) 25 MG tablet Take 25 mg by mouth  daily.     spironolactone (ALDACTONE) 25 MG tablet Take 0.5 tablets by mouth daily.     TOUJEO MAX SOLOSTAR 300 UNIT/ML Solostar Pen SMARTSIG:80 Unit(s) SUB-Q Every Night     triamcinolone acetonide (TRIESENCE) 40 MG/ML SUSP Inject into the articular space.     TRULICITY 4.5 PQ/3.3AQ SOPN SMARTSIG:1 Pre-Filled Pen Syringe SUB-Q Once a Week     No current facility-administered medications for this visit.

## 2021-08-09 NOTE — Assessment & Plan Note (Signed)
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 This has slowly increased since 201.  MRI abdomen was recommended by Dr. Hinton Rao in September, but as her last MRI in September 2021 was stable, follow up MRI in 2 years was recommended.

## 2021-08-09 NOTE — Assessment & Plan Note (Signed)
First-degree relative, brother, with pancreatic cancer.  He declined genetic testing.  Genetic testing has been recommended for the patient, but not done due to lack of insurance coverage. 

## 2021-08-09 NOTE — Assessment & Plan Note (Addendum)
Essential thrombocythemia, well controlled with hydroxyurea 500 mg 3 times a day, which she will continue. She is on chronic opioids due to bone pain. We will plan to see her back in 3 months with a CBC and comprehensive metabolic panel for repeat clinical assessment.

## 2021-08-09 NOTE — Telephone Encounter (Signed)
08/09/21 spoke with patient and confirmed next appt

## 2021-08-11 DIAGNOSIS — D649 Anemia, unspecified: Secondary | ICD-10-CM | POA: Insufficient documentation

## 2021-08-11 DIAGNOSIS — D539 Nutritional anemia, unspecified: Secondary | ICD-10-CM | POA: Insufficient documentation

## 2021-08-18 ENCOUNTER — Other Ambulatory Visit: Payer: Self-pay

## 2021-08-18 DIAGNOSIS — M898X9 Other specified disorders of bone, unspecified site: Secondary | ICD-10-CM

## 2021-08-18 DIAGNOSIS — R161 Splenomegaly, not elsewhere classified: Secondary | ICD-10-CM

## 2021-08-18 MED ORDER — OXYCODONE HCL 10 MG PO TABS: 10.0000 mg | ORAL_TABLET | ORAL | 0 refills | Status: DC | PRN

## 2021-08-18 MED ORDER — OXYCODONE HCL ER 40 MG PO T12A: 40.0000 mg | EXTENDED_RELEASE_TABLET | Freq: Two times a day (BID) | ORAL | 0 refills | Status: DC

## 2021-08-20 DIAGNOSIS — F419 Anxiety disorder, unspecified: Secondary | ICD-10-CM | POA: Diagnosis not present

## 2021-08-20 DIAGNOSIS — M1611 Unilateral primary osteoarthritis, right hip: Secondary | ICD-10-CM | POA: Diagnosis not present

## 2021-08-20 DIAGNOSIS — R8290 Unspecified abnormal findings in urine: Secondary | ICD-10-CM | POA: Diagnosis not present

## 2021-08-20 DIAGNOSIS — I10 Essential (primary) hypertension: Secondary | ICD-10-CM | POA: Diagnosis not present

## 2021-08-20 DIAGNOSIS — E113213 Type 2 diabetes mellitus with mild nonproliferative diabetic retinopathy with macular edema, bilateral: Secondary | ICD-10-CM | POA: Diagnosis not present

## 2021-08-27 DIAGNOSIS — M1611 Unilateral primary osteoarthritis, right hip: Secondary | ICD-10-CM | POA: Diagnosis not present

## 2021-09-13 DIAGNOSIS — M5416 Radiculopathy, lumbar region: Secondary | ICD-10-CM | POA: Diagnosis not present

## 2021-09-13 DIAGNOSIS — M1611 Unilateral primary osteoarthritis, right hip: Secondary | ICD-10-CM | POA: Diagnosis not present

## 2021-09-14 ENCOUNTER — Other Ambulatory Visit: Payer: Self-pay

## 2021-09-14 DIAGNOSIS — M898X9 Other specified disorders of bone, unspecified site: Secondary | ICD-10-CM

## 2021-09-14 DIAGNOSIS — R161 Splenomegaly, not elsewhere classified: Secondary | ICD-10-CM

## 2021-09-14 MED ORDER — OXYCODONE HCL ER 40 MG PO T12A: 40.0000 mg | EXTENDED_RELEASE_TABLET | Freq: Two times a day (BID) | ORAL | 0 refills | Status: DC

## 2021-09-14 MED ORDER — OXYCODONE HCL 10 MG PO TABS: 10.0000 mg | ORAL_TABLET | ORAL | 0 refills | Status: DC | PRN

## 2021-09-15 ENCOUNTER — Other Ambulatory Visit: Payer: Self-pay

## 2021-09-21 DIAGNOSIS — M5116 Intervertebral disc disorders with radiculopathy, lumbar region: Secondary | ICD-10-CM | POA: Diagnosis not present

## 2021-09-21 DIAGNOSIS — M5416 Radiculopathy, lumbar region: Secondary | ICD-10-CM | POA: Diagnosis not present

## 2021-09-29 DIAGNOSIS — M2550 Pain in unspecified joint: Secondary | ICD-10-CM | POA: Diagnosis not present

## 2021-09-30 ENCOUNTER — Other Ambulatory Visit: Payer: Self-pay | Admitting: Hematology and Oncology

## 2021-09-30 DIAGNOSIS — K862 Cyst of pancreas: Secondary | ICD-10-CM

## 2021-10-07 ENCOUNTER — Other Ambulatory Visit: Payer: Self-pay | Admitting: Hematology and Oncology

## 2021-10-07 DIAGNOSIS — D473 Essential (hemorrhagic) thrombocythemia: Secondary | ICD-10-CM

## 2021-10-12 ENCOUNTER — Other Ambulatory Visit: Payer: Self-pay

## 2021-10-12 DIAGNOSIS — M898X9 Other specified disorders of bone, unspecified site: Secondary | ICD-10-CM

## 2021-10-12 DIAGNOSIS — R161 Splenomegaly, not elsewhere classified: Secondary | ICD-10-CM

## 2021-10-12 MED ORDER — OXYCODONE HCL 10 MG PO TABS: 10.0000 mg | ORAL_TABLET | ORAL | 0 refills | Status: DC | PRN

## 2021-10-12 MED ORDER — OXYCODONE HCL ER 40 MG PO T12A: 40.0000 mg | EXTENDED_RELEASE_TABLET | Freq: Two times a day (BID) | ORAL | 0 refills | Status: DC

## 2021-11-07 ENCOUNTER — Other Ambulatory Visit: Payer: Self-pay | Admitting: Oncology

## 2021-11-07 DIAGNOSIS — K862 Cyst of pancreas: Secondary | ICD-10-CM

## 2021-11-07 DIAGNOSIS — D473 Essential (hemorrhagic) thrombocythemia: Secondary | ICD-10-CM

## 2021-11-07 NOTE — Progress Notes (Deleted)
?[facility]  ?[address] ?[place],  [place]  [address] ?[phone] ? ?Clinic Day:  11/07/2021 ? ?Referring physician: Townsend Roger, MD ? ?ASSESSMENT & PLAN:  ? ?Assessment & Plan: ?Essential thrombocytosis ([place]) ?Essential thrombocythemia, well controlled with hydroxyurea 500 mg 3 times a day, which she will continue. She is on chronic opioids due to bone pain. We will plan to see her back in 3 months with a CBC and comprehensive metabolic panel for repeat clinical assessment. ?  ?Family history of pancreatic cancer ?First-degree relative, brother, with pancreatic cancer.  He declined genetic testing.  Genetic testing has been recommended for the patient, but not done due to lack of insurance coverage. ?  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 This has slowly increased since 201.  MRI abdomen was recommended by Dr. Hinton Rao in September, but as her last MRI in September 2021 was stable, follow up MRI in 2 years was recommended. ?  ?Hypertension ?Poorly controlled hypertension, she continues to follow with Dr. Manson Allan.  ?  ? ?The patient understands the plans discussed today and is in agreement with them.  She knows to contact our office if she develops concerns prior to her next appointment. ? ? ? ? ?Derwood Kaplan, MD  ?[facility] ?[place] ?[place] [address] ?Dept: [phone] ?Dept Fax: [phone]  ? ?No orders of the defined types were placed in this encounter. ?  ? ? ?CHIEF COMPLAINT:  ?CC: Essential thrombocythemia ? ?Current Treatment:  Hydroxyurea 3 times daily ? ? ?HISTORY OF PRESENT ILLNESS:  ?Allison Stevens is a 56 y.o. female with a history of essential thrombocythemia originally diagnosed in January 2005.  This has been treated with hydroxyurea, but the patient has often been non-compliant.  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  She has persistent pain in the left upper quadrant, attributed to splenomegaly, as well as bone pain, for which she remains on OxyContin with oxycodone for breakthrough.  We tapered her opioids down as much as possible over time.  ?  ?She saw Dr. Lacinda Axon, her gastroenterologist at [facility], and underwent an esophageal dilatation in August 2016.  She had already had a colonoscopy due to previous gastrointestinal complaints.  We have been following a lesion in the pancreas seen incidentally on CT done in the emergency room for abdominal pain.  This has been felt to be a benign cyst.  Repeat MRI abdomen in November 2016, revealed a slight increase in the benign appearing cyst of the pancreas.  No splenomegaly was seen.  Repeat MRI abdomen in 1 year was recommended.  She was hospitalized in September 2017 and underwent colonoscopy with removal of a small polyp in the sigmoid colon.  Small internal hemorrhoids were also seen.  Dr. Lyndel Safe also obtained a CEA and CA 19-9, and. the CA 19-9 was elevated at 145, the CEA was normal.  Repeat MRI abdomen with contrast in September 2017 revealed a stable 14 mm cystic lesion in the pancreas.  Again, no splenomegaly was seen.  Follow-up MRI in 1 year was recommended.  Repeat MRI abdomen in March 2019 revealed a 17 mm cystic lesion in the pancreatic uncinate process, which was felt to be stable.  No significant liver lesions were seen.  Repeat MRI abdomen in 1 year was recommended.  Repeat MRI abdomen in September 2019 revealed a  stable 19 mm cystic lesion of the pancreas, which appeared to be benign and still felt to be stable.  Repeat MRI abdomen with and without contrast in 1 year was recommended.  MRI abdomen in October 2020 revealed a stable 17 mm cystic lesion of the pancreas.  Continued yearly follow up MRI abdomen was recommended. At her visit in June 2020, we tried decreasing her OxyContin 30 mg every 12 hours, but then increased back to 40 mg  every 12 hours, as her pain was not controlled.  She has since continued the same narcotic regimen. ?  ?Screening mammogram in February 2016 revealed a possible mass in the right breast.  Right diagnostic mammogram and ultrasound was done and revealed 2 well circumscribed hypoechoic lesions in the right breast at 11 o 'clock, felt to represent cysts.  Repeat mammogram and ultrasound in August revealed heterogeneous fibroglandular tissue felt to be benign, with resolution of the previously seen cysts and no masses.  Repeat diagnostic bilateral mammogram and ultrasound in 6 months was once again recommended.  Bilateral diagnostic mammogram in March 2017 did not reveal any evidence of malignancy, so 1 year follow-up screening mammogram was recommended.  Bilateral screening mammogram in March 2018 did not reveal any evidence of malignancy.  Bone density scan in June 2018 revealed osteopenia with a T-score of -1.5 in the femur.  The patient was instructed to take calcium and vitamin-D twice daily.  The patient's family history is significant in that her father had pancreatic cancer at age 17, a paternal uncle had pancreatic cancer and a paternal aunt had pancreatic cancer.  Her brother also had pancreatic cancer at age 50.  She is appropriate for genetic testing, but this has not been done yet due to insurance coverage.  Bilateral screening mammogram in December 2020 did not reveal any evidence of malignancy.  ?  ?Repeat MRI abdomen in September 2021 revealed a 1.9 cm cystic lesion in the pancreatic uncinate process, which showed no significant change compared to more recent exams, but has shown a slow increase in size since 2016. 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 Follow-up by MRI in 2 years was recommended. She remains on Oxycontin 40 mg every 12 hours with oxycodone 10 mg every 4 hours as needed for breakthrough pain. She reports having  another stroke in May 2022.  She then had COVID later in May and was hospitalized due to this.  She had been on hydroxyurea 4 times a day, but this was reduced in September to 3 times a day as her platelets were controlled and she was more anemic with a hemoglobin of 10. Repeat CBC in November revealed her hemoglobin to be back up to 11.6. ? ?INTERVAL HISTORY:  ?Allison Stevens is here today for repeat clinical assessment and states she has been doing relatively well since her last visit.  She states she continues hydroxyurea at least 3 times daily, and occasionally for, as she forgets.  She has had several falls due to balance difficulties, with last 1 being yesterday.  She has discomfort of her right hip, but is able to ambulate without difficulty, so does not wish to have an x-ray.  She denies other neurologic symptoms, such as headaches, vision changes, new paresthesias or weakness.  She states she has been out of her Plavix for about 1 month, but does not remember which doctor prescribed this.  I asked her to contact Dr. Manson Allan for this.  She states she has recently been on a Medrol dose pack for right trigger finger.  She denies fevers or chills. She denies pain. Her appetite is good. Her weight has increased 4 pounds over last 3 months .  She had a bilateral screening mammogram in November that did not reveal any evidence of malignancy.  She is disabled due to her multiple strokes, as well as risk for future thrombosis. ? ?REVIEW OF SYSTEMS:  ?Review of Systems  ?Constitutional:  Negative for appetite change, chills, fatigue, fever and unexpected weight change.  ?HENT:   Negative for lump/mass, mouth sores and sore throat.   ?Respiratory:  Negative for cough and shortness of breath.   ?Cardiovascular:  Negative for chest pain and leg swelling.  ?Gastrointestinal:  Negative for abdominal pain, constipation, diarrhea, nausea and vomiting.  ?Endocrine: Negative for hot flashes.  ?Genitourinary:  Negative for difficulty  urinating, dysuria, frequency and hematuria.   ?Musculoskeletal:  Positive for arthralgias (right hip since fall) and gait problem (limping due to previous stroke). Negative for back pain and myalgias.

## 2021-11-08 ENCOUNTER — Other Ambulatory Visit: Payer: Medicare Other

## 2021-11-08 ENCOUNTER — Ambulatory Visit: Payer: Medicare Other | Admitting: Oncology

## 2021-11-08 DIAGNOSIS — K862 Cyst of pancreas: Secondary | ICD-10-CM

## 2021-11-08 DIAGNOSIS — D473 Essential (hemorrhagic) thrombocythemia: Secondary | ICD-10-CM

## 2021-11-08 NOTE — Progress Notes (Signed)
?[facility]  ?[address] ?[place],  [place]  [address] ?[phone] ? ?Clinic Day: 11/09/21 ? ?Referring physician: Townsend Roger, MD ? ?ASSESSMENT & PLAN:  ?Assessment & Plan: ?Essential thrombocytosis ([place]) ?Essential thrombocythemia, well controlled with hydroxyurea 500 mg 3 times a day, which she will continue. She is on chronic opioids due to bone pain. We will plan to see her back in 3 months with a CBC and comprehensive metabolic panel for repeat clinical assessment. ? ?Worsening anemia ?Hemoglobin has dropped steadily from 11.6 six months ago to 10.4 in January and now 9.8.  I have ordered iron studies as well as B12 and folate and will let her know about those results.  She last had colonoscopy in September 2017 and had a tubular adenoma removed from the sigmoid colon. ?  ?Family history of pancreatic cancer ?First-degree relative, brother, with pancreatic cancer.  He declined genetic testing.  Genetic testing has been recommended for the patient, but not done due to lack of insurance coverage. ?  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 This has slowly increased since 2016.  Her last MRI of the abdomen in September 2021 was stable, and follow up MRI in 2 years was recommended. ? ?Abnormal liver function tests ?The liver transaminases are only slightly abnormal but this is a new finding for her, likely related to fatty liver. ? ?History of colon polyps ?She had a tubular adenoma resected from the sigmoid colon and September 2017. ?  ?Hypertension ?Poorly controlled hypertension, it is better today.  She continues to follow with Dr. Manson Allan.  ? ?I have ordered anemia evaluation with iron, TIBC, ferritin, B12 and folate levels.  I will contact her when I have those results.  However I do think she needs to see Dr. Lyda Jester for repeat colonoscopy.  She had a tubular adenoma removed from the sigmoid colon in 2017.  Her anemia  is slowly worsening.  This sometimes can be related to the hydroxyurea but I feel there is more than that going on.  I will make the referral to Dr. Lyda Jester.  We will also plan a repeat MRI of the abdomen in September and she will be due for annual mammography in November.  See her back in 3 months with CBC, comprehensive metabolic profile and CA 93-8.  She did have elevation of this tumor marker years ago.  The patient understands the plans discussed today and is in agreement with them.  She knows to contact our office if she develops concerns prior to her next appointment. ? ? ?Derwood Kaplan, MD  ?[facility] ?[place] ?[place]  [address] ?Dept: [phone] ?Dept Fax: [phone]  ? ? ? ? ?CHIEF COMPLAINT:  ?CC: Essential thrombocythemia ? ?Current Treatment:  Hydroxyurea 500 mg 3 times daily ? ? ?HISTORY OF PRESENT ILLNESS:  ?Darbi Chandran is a 56 y.o. female with a history of essential thrombocythemia originally diagnosed in January 2005.  This has been treated with hydroxyurea, but the patient has often been non-compliant.  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  She has persistent pain in the left upper quadrant, attributed to splenomegaly, as well as bone pain, for which she remains on OxyContin with oxycodone for breakthrough.  We tapered her opioids down as much as possible over time.  ?  ?She saw Dr. Lacinda Axon, her gastroenterologist at [facility],  and underwent an esophageal dilatation in August 2016.  She had already had a colonoscopy due to previous gastrointestinal complaints.  We have been following a lesion in the pancreas seen incidentally on CT done in the emergency room for abdominal pain.  This has been felt to be a benign cyst.  Repeat MRI abdomen in November 2016, revealed a slight increase in the benign appearing cyst of the pancreas.  No splenomegaly was seen.  Repeat MRI  abdomen in 1 year was recommended.  She was hospitalized in September 2017 and underwent colonoscopy with removal of a small polyp in the sigmoid colon.  Small internal hemorrhoids were also seen.  Dr. Lyndel Safe also obtained a CEA and CA 19-9, and. the CA 19-9 was elevated at 145, the CEA was normal.  Repeat MRI abdomen with contrast in September 2017 revealed a stable 14 mm cystic lesion in the pancreas.  Again, no splenomegaly was seen.  Follow-up MRI in 1 year was recommended.  Repeat MRI abdomen in March 2019 revealed a 17 mm cystic lesion in the pancreatic uncinate process, which was felt to be stable.  No significant liver lesions were seen.  Repeat MRI abdomen in 1 year was recommended.  Repeat MRI abdomen in September 2019 revealed a stable 19 mm cystic lesion of the pancreas, which appeared to be benign and still felt to be stable.  Repeat MRI abdomen with and without contrast in 1 year was recommended.  MRI abdomen in October 2020 revealed a stable 17 mm cystic lesion of the pancreas.  Continued yearly follow up MRI abdomen was recommended. At her visit in June 2020, we tried decreasing her OxyContin 30 mg every 12 hours, but then increased back to 40 mg every 12 hours, as her pain was not controlled.  She has since continued the same narcotic regimen. ?  ?Screening mammogram in February 2016 revealed a possible mass in the right breast.  Right diagnostic mammogram and ultrasound was done and revealed 2 well circumscribed hypoechoic lesions in the right breast at 11 o 'clock, felt to represent cysts.  Repeat mammogram and ultrasound in August revealed heterogeneous fibroglandular tissue felt to be benign, with resolution of the previously seen cysts and no masses.  Repeat diagnostic bilateral mammogram and ultrasound in 6 months was once again recommended.  Bilateral diagnostic mammogram in March 2017 did not reveal any evidence of malignancy, so 1 year follow-up screening mammogram was recommended.   Bilateral screening mammogram in March 2018 did not reveal any evidence of malignancy.  Bone density scan in June 2018 revealed osteopenia with a T-score of -1.5 in the femur.  The patient was instructed to take calcium and vitamin-D twice daily.  The patient's family history is significant in that her father had pancreatic cancer at age 43, a paternal uncle had pancreatic cancer and a paternal aunt had pancreatic cancer.  Her brother also had pancreatic cancer at age 35.  She is appropriate for genetic testing, but this has not been done yet due to insurance coverage.  Bilateral screening mammogram in December 2020 did not reveal any evidence of malignancy.  ?  ?Repeat MRI abdomen in September 2021 revealed a 1.9 cm cystic lesion in the pancreatic uncinate process, which showed no significant change compared to more recent exams, but has shown a slow increase in size since 2016. 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 Follow-up by MRI in 2 years was recommended. She remains  on Oxycontin 40 mg every 12 hours with oxycodone 10 mg every 4 hours as needed for breakthrough pain. She reports having another stroke in May 2022.  She then had COVID later in May and was hospitalized due to this.  She had been on hydroxyurea 4 times a day, but this was reduced in September to 3 times a day as her platelets were controlled and she was more anemic with a hemoglobin of 10. Repeat CBC in November revealed her hemoglobin to be back up to 11.6.  Her last colonoscopy was in September 2017 and she did have a polypectomy of a tubular adenoma of the sigmoid colon at that time.  He also had an EGD which showed mild gastritis, a small hiatal hernia, and a Schatzki ring.  She did have esophageal dilation at that time which was in April 2018 ? ?INTERVAL HISTORY:  ?Ligia is here today for repeat clinical assessment and states she has been doing relatively well  since her last visit.  She states she continues hydroxyurea at least 3 times daily, and occasionally for, as she forgets.  She has had several falls due to balance difficulties from prior stroke.  Planes of pain of

## 2021-11-09 ENCOUNTER — Other Ambulatory Visit: Payer: Self-pay

## 2021-11-09 ENCOUNTER — Inpatient Hospital Stay: Payer: Medicare Other | Attending: Oncology

## 2021-11-09 ENCOUNTER — Encounter: Payer: Self-pay | Admitting: Oncology

## 2021-11-09 ENCOUNTER — Inpatient Hospital Stay (INDEPENDENT_AMBULATORY_CARE_PROVIDER_SITE_OTHER): Payer: Medicare Other | Admitting: Oncology

## 2021-11-09 ENCOUNTER — Other Ambulatory Visit: Payer: Self-pay | Admitting: Oncology

## 2021-11-09 ENCOUNTER — Telehealth: Payer: Self-pay | Admitting: Oncology

## 2021-11-09 VITALS — BP 189/81 | HR 86 | Temp 97.9°F | Resp 18 | Ht 64.0 in | Wt 159.4 lb

## 2021-11-09 DIAGNOSIS — R7989 Other specified abnormal findings of blood chemistry: Secondary | ICD-10-CM | POA: Diagnosis not present

## 2021-11-09 DIAGNOSIS — D473 Essential (hemorrhagic) thrombocythemia: Secondary | ICD-10-CM | POA: Insufficient documentation

## 2021-11-09 DIAGNOSIS — D539 Nutritional anemia, unspecified: Secondary | ICD-10-CM

## 2021-11-09 DIAGNOSIS — Z800 Family history of malignant neoplasm of digestive organs: Secondary | ICD-10-CM | POA: Insufficient documentation

## 2021-11-09 DIAGNOSIS — I10 Essential (primary) hypertension: Secondary | ICD-10-CM | POA: Insufficient documentation

## 2021-11-09 DIAGNOSIS — Z79899 Other long term (current) drug therapy: Secondary | ICD-10-CM | POA: Insufficient documentation

## 2021-11-09 DIAGNOSIS — D125 Benign neoplasm of sigmoid colon: Secondary | ICD-10-CM | POA: Diagnosis not present

## 2021-11-09 DIAGNOSIS — M898X9 Other specified disorders of bone, unspecified site: Secondary | ICD-10-CM

## 2021-11-09 DIAGNOSIS — R161 Splenomegaly, not elsewhere classified: Secondary | ICD-10-CM

## 2021-11-09 LAB — FOLATE: Folate: 8.5 ng/mL (ref >5.9)

## 2021-11-09 LAB — BASIC METABOLIC PANEL
BUN: 8 (ref 4–21)
CO2: 31 — AB (ref 13–22)
Chloride: 106 (ref 99–108)
Creatinine: 0.6 (ref 0.5–1.1)
Glucose: 151
Potassium: 3.7 mEq/L (ref 3.5–5.1)
Sodium: 142 (ref 137–147)

## 2021-11-09 LAB — HEPATIC FUNCTION PANEL
ALT: 41 U/L — AB (ref 7–35)
AST: 42 — AB (ref 13–35)
Alkaline Phosphatase: 68 (ref 25–125)
Bilirubin, Total: 0.4

## 2021-11-09 LAB — FERRITIN: Ferritin: 59 ng/mL (ref 11–307)

## 2021-11-09 LAB — CBC AND DIFFERENTIAL
HCT: 30 — AB (ref 36–46)
Hemoglobin: 9.8 — AB (ref 12.0–16.0)
Neutrophils Absolute: 3.22
Platelets: 323 10*3/uL (ref 150–400)
WBC: 4.6

## 2021-11-09 LAB — VITAMIN B12: Vitamin B-12: 204 pg/mL (ref 180–914)

## 2021-11-09 LAB — IRON AND TIBC
Iron: 79 ug/dL (ref 28–170)
Saturation Ratios: 26 % (ref 10.4–31.8)
TIBC: 301 ug/dL (ref 250–450)
UIBC: 222 ug/dL

## 2021-11-09 LAB — COMPREHENSIVE METABOLIC PANEL
Albumin: 4 (ref 3.5–5.0)
Calcium: 8.9 (ref 8.7–10.7)

## 2021-11-09 LAB — CBC: RBC: 3.25 — AB (ref 3.87–5.11)

## 2021-11-09 MED ORDER — OXYCODONE HCL ER 40 MG PO T12A: 40.0000 mg | EXTENDED_RELEASE_TABLET | Freq: Two times a day (BID) | ORAL | 0 refills | Status: DC

## 2021-11-09 MED ORDER — OXYCODONE HCL 10 MG PO TABS: 10.0000 mg | ORAL_TABLET | ORAL | 0 refills | Status: DC | PRN

## 2021-11-09 NOTE — Telephone Encounter (Signed)
Per 11/09/21 los next appt scheduled and confirmed with patient ?

## 2021-11-12 ENCOUNTER — Telehealth: Payer: Self-pay

## 2021-11-12 ENCOUNTER — Other Ambulatory Visit: Payer: Self-pay

## 2021-11-12 DIAGNOSIS — D125 Benign neoplasm of sigmoid colon: Secondary | ICD-10-CM

## 2021-11-12 DIAGNOSIS — D539 Nutritional anemia, unspecified: Secondary | ICD-10-CM

## 2021-11-12 NOTE — Telephone Encounter (Signed)
-----   Message from Derwood Kaplan, MD sent at 11/09/2021 10:10 AM EDT ----- ?Regarding: refer ?Pls refer to Dr. Lyda Jester, has worsening anemia.  Last colonoscopy was 2017 and had a tubular adenoma, I think she needs colonoscopy and EGD ? ?

## 2021-11-12 NOTE — Telephone Encounter (Signed)
-----   Message from Derwood Kaplan, MD sent at 11/11/2021 10:44 AM EDT ----- ?Regarding: call ?Tell her iron and folate are okay but B12 low normal.  I rec she take 500 mcg daily. ? ?Pls send my 4/11 note to Dr. Lyda Jester ? ?

## 2021-11-12 NOTE — Telephone Encounter (Signed)
Faxed referral to Dr. Crisoforo Oxford office. ?

## 2021-11-12 NOTE — Telephone Encounter (Signed)
Patient notified

## 2021-11-15 ENCOUNTER — Inpatient Hospital Stay: Payer: Medicare Other

## 2021-11-15 DIAGNOSIS — E1165 Type 2 diabetes mellitus with hyperglycemia: Secondary | ICD-10-CM | POA: Diagnosis not present

## 2021-11-15 DIAGNOSIS — E785 Hyperlipidemia, unspecified: Secondary | ICD-10-CM | POA: Diagnosis not present

## 2021-11-15 DIAGNOSIS — I10 Essential (primary) hypertension: Secondary | ICD-10-CM | POA: Diagnosis not present

## 2021-11-15 DIAGNOSIS — E113213 Type 2 diabetes mellitus with mild nonproliferative diabetic retinopathy with macular edema, bilateral: Secondary | ICD-10-CM | POA: Diagnosis not present

## 2021-11-15 DIAGNOSIS — F419 Anxiety disorder, unspecified: Secondary | ICD-10-CM | POA: Diagnosis not present

## 2021-11-24 ENCOUNTER — Encounter: Payer: Self-pay | Admitting: Gastroenterology

## 2021-12-07 ENCOUNTER — Other Ambulatory Visit: Payer: Self-pay

## 2021-12-07 DIAGNOSIS — M898X9 Other specified disorders of bone, unspecified site: Secondary | ICD-10-CM

## 2021-12-07 DIAGNOSIS — R161 Splenomegaly, not elsewhere classified: Secondary | ICD-10-CM

## 2021-12-07 MED ORDER — OXYCODONE HCL ER 40 MG PO T12A: 40.0000 mg | EXTENDED_RELEASE_TABLET | Freq: Two times a day (BID) | ORAL | 0 refills | Status: DC

## 2021-12-07 MED ORDER — OXYCODONE HCL 10 MG PO TABS: 10.0000 mg | ORAL_TABLET | ORAL | 0 refills | Status: DC | PRN

## 2021-12-08 ENCOUNTER — Other Ambulatory Visit: Payer: Self-pay | Admitting: Hematology and Oncology

## 2021-12-08 DIAGNOSIS — M898X9 Other specified disorders of bone, unspecified site: Secondary | ICD-10-CM

## 2021-12-08 DIAGNOSIS — R161 Splenomegaly, not elsewhere classified: Secondary | ICD-10-CM

## 2021-12-08 MED ORDER — OXYCODONE HCL 10 MG PO TABS: 10.0000 mg | ORAL_TABLET | ORAL | 0 refills | Status: DC | PRN

## 2022-01-03 ENCOUNTER — Other Ambulatory Visit: Payer: Self-pay

## 2022-01-03 DIAGNOSIS — M898X9 Other specified disorders of bone, unspecified site: Secondary | ICD-10-CM

## 2022-01-03 DIAGNOSIS — R161 Splenomegaly, not elsewhere classified: Secondary | ICD-10-CM

## 2022-01-03 MED ORDER — OXYCODONE HCL 10 MG PO TABS: 10.0000 mg | ORAL_TABLET | ORAL | 0 refills | Status: DC | PRN

## 2022-01-03 MED ORDER — OXYCODONE HCL ER 40 MG PO T12A: 40.0000 mg | EXTENDED_RELEASE_TABLET | Freq: Two times a day (BID) | ORAL | 0 refills | Status: DC

## 2022-01-05 ENCOUNTER — Telehealth: Payer: Self-pay

## 2022-01-05 ENCOUNTER — Other Ambulatory Visit: Payer: Self-pay | Admitting: Hematology and Oncology

## 2022-01-05 MED ORDER — HYDROCODONE-ACETAMINOPHEN 10-300 MG PO TABS: 1.0000 | ORAL_TABLET | ORAL | 0 refills | Status: DC | PRN

## 2022-01-05 NOTE — Telephone Encounter (Addendum)
Pt notified of below and verbalized understanding.  ----- Message from [name], PA-C sent at 01/05/2022  1:00 PM EDT ----- Sent in hydrocodone/APAP 10/300 every 4 hours as needed as that was on formulary to substitute for for oxycodone 10.  Please let her know not to take any additional Tylenol while on this.  Thank you

## 2022-01-06 ENCOUNTER — Encounter: Payer: Self-pay | Admitting: Hematology and Oncology

## 2022-01-31 ENCOUNTER — Other Ambulatory Visit: Payer: Self-pay

## 2022-01-31 DIAGNOSIS — R161 Splenomegaly, not elsewhere classified: Secondary | ICD-10-CM

## 2022-01-31 DIAGNOSIS — M898X9 Other specified disorders of bone, unspecified site: Secondary | ICD-10-CM

## 2022-01-31 MED ORDER — OXYCODONE HCL 10 MG PO TABS: 10.0000 mg | ORAL_TABLET | ORAL | 0 refills | Status: DC | PRN

## 2022-01-31 MED ORDER — OXYCODONE HCL ER 40 MG PO T12A: 40.0000 mg | EXTENDED_RELEASE_TABLET | Freq: Two times a day (BID) | ORAL | 0 refills | Status: DC

## 2022-02-03 DIAGNOSIS — E083212 Diabetes mellitus due to underlying condition with mild nonproliferative diabetic retinopathy with macular edema, left eye: Secondary | ICD-10-CM | POA: Diagnosis not present

## 2022-02-03 DIAGNOSIS — H5213 Myopia, bilateral: Secondary | ICD-10-CM | POA: Diagnosis not present

## 2022-02-03 DIAGNOSIS — Z794 Long term (current) use of insulin: Secondary | ICD-10-CM | POA: Diagnosis not present

## 2022-02-03 DIAGNOSIS — H52223 Regular astigmatism, bilateral: Secondary | ICD-10-CM | POA: Diagnosis not present

## 2022-02-04 DIAGNOSIS — E11319 Type 2 diabetes mellitus with unspecified diabetic retinopathy without macular edema: Secondary | ICD-10-CM | POA: Diagnosis not present

## 2022-02-04 DIAGNOSIS — F419 Anxiety disorder, unspecified: Secondary | ICD-10-CM | POA: Diagnosis not present

## 2022-02-04 DIAGNOSIS — G894 Chronic pain syndrome: Secondary | ICD-10-CM | POA: Diagnosis not present

## 2022-02-04 DIAGNOSIS — F330 Major depressive disorder, recurrent, mild: Secondary | ICD-10-CM | POA: Diagnosis not present

## 2022-02-06 NOTE — Progress Notes (Deleted)
Allison Stevens  [address] [place],    [address] [phone]  Clinic Day: 11/09/21  Referring physician: Townsend Roger, MD  ASSESSMENT & PLAN:  Assessment & Plan: Essential thrombocytosis ([place]) Essential thrombocythemia, well controlled with hydroxyurea 500 mg 3 times a day, which she will continue. She is on chronic opioids due to bone pain. We will plan to see her back in 3 months with a CBC and comprehensive metabolic panel for repeat clinical assessment.  Worsening anemia Hemoglobin has dropped steadily from 11.6 six months ago to 10.4 in January and now 9.8.  I have ordered iron studies as well as B12 and folate and will let her know about those results.  She last had colonoscopy in September 2017 and had a tubular adenoma removed from the sigmoid colon.   Family history of pancreatic cancer First-degree relative, brother, with pancreatic cancer.  He declined genetic testing.  Genetic testing has been recommended for the patient, but not done due to lack of insurance coverage.   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 This has slowly increased since 2016.  Her last MRI of the abdomen in September 2021 was stable, and follow up MRI in 2 years was recommended.  Abnormal liver function tests The liver transaminases are only slightly abnormal but this is a new finding for her, likely related to fatty liver.  History of colon polyps She had a tubular adenoma resected from the sigmoid colon and September 2017.   Hypertension Poorly controlled hypertension, it is better today.  She continues to follow with Dr. Manson Allan.   I have ordered anemia evaluation with iron, TIBC, ferritin, B12 and folate levels.  I will contact her when I have those results.  However I do think she needs to see Dr. Lyda Jester for repeat colonoscopy.  She had a tubular adenoma removed from the sigmoid colon in 2017.  Her anemia  is slowly worsening.  This sometimes can be related to the hydroxyurea but I feel there is more than that going on.  I will make the referral to Dr. Lyda Jester.  We will also plan a repeat MRI of the abdomen in September and she will be due for annual mammography in November.  See her back in 3 months with CBC, comprehensive metabolic profile and CA 03-5.  She did have elevation of this tumor marker years ago.  The patient understands the plans discussed today and is in agreement with them.  She knows to contact our office if she develops concerns prior to her next appointment.   Allison Kaplan, MD  [facility] [address] [place] [address] Dept: [phone] Dept Fax: [phone]      CHIEF COMPLAINT:  CC: Essential thrombocythemia  Current Treatment:  Hydroxyurea 500 mg 3 times daily   HISTORY OF PRESENT ILLNESS:  Allison Stevens is a 56 y.o. female with a history of essential thrombocythemia originally diagnosed in January 2005.  This has been treated with hydroxyurea, but the patient has often been non-compliant.  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  She has persistent pain in the left upper quadrant, attributed to splenomegaly, as well as bone pain, for which she remains on OxyContin with oxycodone for breakthrough.  We tapered her opioids down as much as possible over time.    She saw Dr. Lacinda Axon, her gastroenterologist at [facility],  and underwent an esophageal dilatation in August 2016.  She had already had a colonoscopy due to previous gastrointestinal complaints.  We have been following a lesion in the pancreas seen incidentally on CT done in the emergency room for abdominal pain.  This has been felt to be a benign cyst.  Repeat MRI abdomen in November 2016, revealed a slight increase in the benign appearing cyst of the pancreas.  No splenomegaly was seen.  Repeat MRI  abdomen in 1 year was recommended.  She was hospitalized in September 2017 and underwent colonoscopy with removal of a small polyp in the sigmoid colon.  Small internal hemorrhoids were also seen.  Dr. Lyndel Safe also obtained a CEA and CA 19-9, and. the CA 19-9 was elevated at 145, the CEA was normal.  Repeat MRI abdomen with contrast in September 2017 revealed a stable 14 mm cystic lesion in the pancreas.  Again, no splenomegaly was seen.  Follow-up MRI in 1 year was recommended.  Repeat MRI abdomen in March 2019 revealed a 17 mm cystic lesion in the pancreatic uncinate process, which was felt to be stable.  No significant liver lesions were seen.  Repeat MRI abdomen in 1 year was recommended.  Repeat MRI abdomen in September 2019 revealed a stable 19 mm cystic lesion of the pancreas, which appeared to be benign and still felt to be stable.  Repeat MRI abdomen with and without contrast in 1 year was recommended.  MRI abdomen in October 2020 revealed a stable 17 mm cystic lesion of the pancreas.  Continued yearly follow up MRI abdomen was recommended. At her visit in June 2020, we tried decreasing her OxyContin 30 mg every 12 hours, but then increased back to 40 mg every 12 hours, as her pain was not controlled.  She has since continued the same narcotic regimen.   Screening mammogram in February 2016 revealed a possible mass in the right breast.  Right diagnostic mammogram and ultrasound was done and revealed 2 well circumscribed hypoechoic lesions in the right breast at 11 o 'clock, felt to represent cysts.  Repeat mammogram and ultrasound in August revealed heterogeneous fibroglandular tissue felt to be benign, with resolution of the previously seen cysts and no masses.  Repeat diagnostic bilateral mammogram and ultrasound in 6 months was once again recommended.  Bilateral diagnostic mammogram in March 2017 did not reveal any evidence of malignancy, so 1 year follow-up screening mammogram was recommended.   Bilateral screening mammogram in March 2018 did not reveal any evidence of malignancy.  Bone density scan in June 2018 revealed osteopenia with a T-score of -1.5 in the femur.  The patient was instructed to take calcium and vitamin-D twice daily.  The patient's family history is significant in that her father had pancreatic cancer at age 65, a paternal uncle had pancreatic cancer and a paternal aunt had pancreatic cancer.  Her brother also had pancreatic cancer at age 13.  She is appropriate for genetic testing, but this has not been done yet due to insurance coverage.  Bilateral screening mammogram in December 2020 did not reveal any evidence of malignancy.    Repeat MRI abdomen in September 2021 revealed a 1.9 cm cystic lesion in the pancreatic uncinate process, which showed no significant change compared to more recent exams, but has shown a slow increase in size since 2016. 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 Follow-up by MRI in 2 years was recommended. She remains  on Oxycontin 40 mg every 12 hours with oxycodone 10 mg every 4 hours as needed for breakthrough pain. She reports having another stroke in May 2022.  She then had COVID later in May and was hospitalized due to this.  She had been on hydroxyurea 4 times a day, but this was reduced in September to 3 times a day as her platelets were controlled and she was more anemic with a hemoglobin of 10. Repeat CBC in November revealed her hemoglobin to be back up to 11.6.  Her last colonoscopy was in September 2017 and she did have a polypectomy of a tubular adenoma of the sigmoid colon at that time.  He also had an EGD which showed mild gastritis, a small hiatal hernia, and a Schatzki ring.  She did have esophageal dilation at that time which was in April 2018  INTERVAL HISTORY:  Fayne is here today for repeat clinical assessment and states she has been doing relatively well  since her last visit.  She states she continues hydroxyurea at least 3 times daily, and occasionally for, as she forgets.  She has had several falls due to balance difficulties from prior stroke.  Planes of pain of the left upper quadrant of the abdomen.  She also complains of weakness and increased bruising.  She denies any bleeding, melena, hematochezia or hematemesis.  She denies other neurologic symptoms, such as headaches, vision changes, new paresthesias or weakness.  Her hemoglobin has dropped to 9.8 and I am concerned since it was 11.6 in November and then dropped to 10.4 last time.  Normally she is very macrocytic due to the hydroxyurea, but her MCV is down to 91 today.  Comprehensive metabolic profile reveals a nonfasting blood sugar of 151 but she does have a slight increase in her liver transaminases, which is a new finding.  The SGOT is 42 with an SGPT of 41 and the rest of her CMP is normal.  She denies fevers or chills. She denies pain. Her appetite is good. Her weight has increased 4 pounds over last 3 months .  She had a bilateral screening mammogram in November that did not reveal any evidence of malignancy.  She is disabled due to her multiple strokes, as well as risk for future thrombosis.  REVIEW OF SYSTEMS:  Review of Systems  Constitutional:  Negative for appetite change, chills, fatigue, fever and unexpected weight change.  HENT:   Negative for lump/mass, mouth sores and sore throat.   Respiratory:  Negative for cough and shortness of breath.   Cardiovascular:  Negative for chest pain and leg swelling.  Gastrointestinal:  Negative for abdominal pain, constipation, diarrhea, nausea and vomiting.  Endocrine: Negative for hot flashes.  Genitourinary:  Negative for difficulty urinating, dysuria, frequency and hematuria.   Musculoskeletal:  Positive for gait problem (limping due to previous stroke). Negative for back pain and myalgias. Arthralgias: right hip since fall. Skin:  Negative  for rash.  Neurological:  Positive for gait problem (limping due to previous stroke). Negative for dizziness, headaches and numbness.  Hematological:  Negative for adenopathy. Bruises/bleeds easily.  Psychiatric/Behavioral:  Negative for depression and sleep disturbance. The patient is not nervous/anxious.      VITALS:  Blood pressure (!) 158/60, pulse 73, temperature 97.7 F (36.5 C), temperature source Oral, resp. rate 18, height '5\' 4"'$  (1.626 m), weight 156 lb 8 oz (71 kg), SpO2 98 %.  Wt Readings from Last 3 Encounters:  02/08/22 156 lb 8 oz (71 kg)  11/09/21 159 lb 6.4 oz (72.3 kg)  08/09/21 155 lb 6.4 oz (70.5 kg)    Body mass index is 26.86 kg/m.  Performance status (ECOG): 1 - Symptomatic but completely ambulatory  PHYSICAL EXAM:  Physical Exam Vitals and nursing note reviewed.  Constitutional:      General: She is not in acute distress.    Appearance: Normal appearance.  HENT:     Head: Normocephalic and atraumatic.     Mouth/Throat:     Mouth: Mucous membranes are moist.     Pharynx: Oropharynx is clear. No oropharyngeal exudate or posterior oropharyngeal erythema.  Eyes:     General: No scleral icterus.    Extraocular Movements: Extraocular movements intact.     Conjunctiva/sclera: Conjunctivae normal.     Pupils: Pupils are equal, round, and reactive to light.  Cardiovascular:     Rate and Rhythm: Normal rate and regular rhythm.     Heart sounds: Normal heart sounds. No murmur heard.    No friction rub. No gallop.  Pulmonary:     Effort: Pulmonary effort is normal.     Breath sounds: Normal breath sounds. No wheezing, rhonchi or rales.  Abdominal:     General: There is no distension.     Palpations: Abdomen is soft. There is no hepatomegaly, splenomegaly or mass.     Tenderness: There is no abdominal tenderness.  Musculoskeletal:        General: Normal range of motion.     Cervical back: Normal range of motion and neck supple. No tenderness.     Right lower  leg: No edema.     Left lower leg: No edema.  Lymphadenopathy:     Cervical: No cervical adenopathy.     Upper Body:     Right upper body: No supraclavicular or axillary adenopathy.     Left upper body: No supraclavicular or axillary adenopathy.     Lower Body: No right inguinal adenopathy. No left inguinal adenopathy.  Skin:    General: Skin is warm and dry.     Coloration: Skin is not jaundiced.     Findings: No rash.  Neurological:     Mental Status: She is alert and oriented to person, place, and time.     Cranial Nerves: No cranial nerve deficit.  Psychiatric:        Mood and Affect: Mood normal.        Behavior: Behavior normal.        Thought Content: Thought content normal.   LABS:      Latest Ref Rng & Units 02/08/2022   12:00 AM 11/09/2021   12:00 AM 08/09/2021   12:00 AM  CBC  WBC  4.0     4.6     5.0   Hemoglobin 12.0 - 16.0 10.5     9.8     10.4   Hematocrit 36 - 46 31     30     32   Platelets 150 - 400 K/uL 291     323     343      This result is from an external source.      Latest Ref Rng & Units 02/08/2022   12:00 AM 11/09/2021   12:00 AM 08/09/2021   12:00 AM  CMP  BUN 4 - '21 8     8     9   '$ Creatinine 0.5 - 1.1 0.7     0.6     0.8  Sodium 137 - 147 141     142     140   Potassium 3.5 - 5.1 mEq/L 3.4     3.7     4.2   Chloride 99 - 108 107     106     109   CO2 13 - '22 28     31     26   '$ Calcium 8.7 - 10.7 9.1     8.9     9.1   Alkaline Phos 25 - 125 69     68     77   AST 13 - 35 31     42     36   ALT 7 - 35 U/L 29     41     34      This result is from an external source.     No results found for: "CEA1", "CEA" / No results found for: "CEA1", "CEA" No results found for: "PSA1" No results found for: "DQQ229" No results found for: "CAN125"  No results found for: "TOTALPROTELP", "ALBUMINELP", "A1GS", "A2GS", "BETS", "BETA2SER", "GAMS", "MSPIKE", "SPEI" Lab Results  Component Value Date   TIBC 301 11/09/2021   FERRITIN 59 11/09/2021    IRONPCTSAT 26 11/09/2021   No results found for: "LDH"  STUDIES:   Exam(s): [id number] MAM/MAM DIGITAL SCREENING-BILAT  CLINICAL DATA: Screening.  EXAM:  DIGITAL SCREENING BILATERAL MAMMOGRAM WITH CAD  TECHNIQUE:  Bilateral screening digital craniocaudal and mediolateral oblique  mammograms were obtained. The images were evaluated with  computer-aided detection.  COMPARISON: Previous exam(s).  ACR Breast Density Category b: There are scattered areas of  fibroglandular density.   FINDINGS:  There are no findings suspicious for malignancy.  IMPRESSION:  No mammographic evidence of malignancy. A result letter of this  screening mammogram will be mailed directly to the patient.  RECOMMENDATION:  Screening mammogram in one year. (Code:SM-B-01Y)  BI-RADS CATEGORY 1: Negative.   HISTORY:   Past Medical History:  Diagnosis Date  . Acid reflux   . Diabetes ([place]) 2002  . Family history of pancreatic cancer 08/09/2021  . Gait abnormality   . Hypertension   . Migraine   . Pancreatic cyst 08/09/2021  . Stroke ([place])   . Thrombocytosis     Past Surgical History:  Procedure Laterality Date  . CESAREAN SECTION  1997  . GALLBLADDER SURGERY  2011    Family History  Problem Relation Age of Onset  . Diabetes Mother   . Cancer Mother   . Cancer Father   . Healthy Child     Social History:  reports that she has never smoked. She has never used smokeless tobacco. She reports that she does not drink alcohol and does not use drugs.The patient is alone today.  Allergies:  Allergies  Allergen Reactions  . Iodinated Contrast Media Other (See Comments) and Shortness Of Breath    Current Medications: Current Outpatient Medications  Medication Sig Dispense Refill  . hydroxyurea (HYDREA) 500 MG capsule TAKE 1 CAPSULE BY MOUTH THREE TIMES A DAY 270 capsule 1  . ALPRAZolam (XANAX) 0.5 MG tablet Take 0.5 mg by mouth at bedtime as needed for anxiety.    Marland Kitchen amLODipine (NORVASC) 10 MG tablet  Take 10 mg by mouth daily.    Marland Kitchen aspirin 81 MG chewable tablet Chew 1 tablet by mouth daily.    . carvedilol (COREG) 12.5 MG tablet Take 12.5 mg by mouth 2 (two) times daily.    Marland Kitchen  clopidogrel (PLAVIX) 75 MG tablet Take 75 mg by mouth daily. (Patient not taking: Reported on 08/09/2021)    . diclofenac Sodium (VOLTAREN) 1 % GEL Apply 4 g topically 4 (four) times daily.    . enalapril (VASOTEC) 20 MG tablet Take 20 mg by mouth 2 (two) times daily.    Marland Kitchen escitalopram (LEXAPRO) 10 MG tablet Take 10 mg by mouth daily.    Marland Kitchen glimepiride (AMARYL) 2 MG tablet Take by mouth.    . hydrochlorothiazide (HYDRODIURIL) 12.5 MG tablet Take 12.5 mg by mouth daily.    Marland Kitchen ibuprofen (ADVIL,MOTRIN) 800 MG tablet Take 800 mg by mouth every 8 (eight) hours as needed.    . Insulin Detemir (LEVEMIR) 100 UNIT/ML Pen Inject into the skin.    Marland Kitchen lidocaine (XYLOCAINE) 1 % (with preservative) injection by Infiltration route.    . metFORMIN (GLUCOPHAGE) 1000 MG tablet Take 1,000 mg by mouth 2 (two) times daily with a meal.    . metoCLOPramide (REGLAN) 5 MG tablet Take 5 mg by mouth every 8 (eight) hours.    . montelukast (SINGULAIR) 10 MG tablet Take 10 mg by mouth at bedtime.    . niacin 500 MG tablet Take 500 mg by mouth at bedtime.    Marland Kitchen NOVOLOG FLEXPEN 100 UNIT/ML FlexPen SMARTSIG:34 Unit(s) SUB-Q 3 Times Daily    . ondansetron (ZOFRAN-ODT) 4 MG disintegrating tablet TAKE 1 TABLET BY MOUTH EVERY 6 HOURS AS NEEDED FOR NAUSEA/VOMITING    . oxyCODONE (OXYCONTIN) 20 mg 12 hr tablet Take 1 tablet (20 mg total) by mouth every 12 (twelve) hours. 60 tablet 0  . Oxycodone HCl 10 MG TABS Take 1 tablet (10 mg total) by mouth every 4 (four) hours as needed. 120 tablet 0  . pantoprazole (PROTONIX) 40 MG tablet Take 1 tablet (40 mg total) by mouth daily. 30 tablet 6  . Potassium Chloride ER 20 MEQ TBCR Take 1 tablet by mouth daily.    . pravastatin (PRAVACHOL) 40 MG tablet Take 40 mg by mouth daily.    . prochlorperazine (COMPAZINE) 10 MG  tablet Take 10 mg by mouth every 6 (six) hours as needed for nausea or vomiting.    . sertraline (ZOLOFT) 25 MG tablet Take 25 mg by mouth daily.    Marland Kitchen spironolactone (ALDACTONE) 25 MG tablet Take 0.5 tablets by mouth daily.    Nelva Nay MAX SOLOSTAR 300 UNIT/ML Solostar Pen SMARTSIG:80 Unit(s) SUB-Q Every Night    . triamcinolone acetonide (TRIESENCE) 40 MG/ML SUSP Inject into the articular space.    . TRULICITY 4.5 AY/3.0ZS SOPN SMARTSIG:1 Pre-Filled Pen Syringe SUB-Q Once a Week     No current facility-administered medications for this visit.

## 2022-02-07 ENCOUNTER — Telehealth: Payer: Self-pay

## 2022-02-07 NOTE — Telephone Encounter (Signed)
-----   Message from Derwood Kaplan, MD sent at 02/06/2022  7:30 PM EDT ----- Regarding: MRI Did she have an MRI abdomen this year?

## 2022-02-07 NOTE — Telephone Encounter (Signed)
Per Langley Gauss, patient did not show up for MRI and they attempted to contact her and she had not called back to reschedule yet.

## 2022-02-07 NOTE — Telephone Encounter (Signed)
Denise in scheduling is checking to see if patient has this done. Unable to find in [facility] or Epic.

## 2022-02-08 ENCOUNTER — Other Ambulatory Visit: Payer: Self-pay | Admitting: Oncology

## 2022-02-08 ENCOUNTER — Inpatient Hospital Stay: Payer: Medicare Other | Attending: Oncology | Admitting: Oncology

## 2022-02-08 ENCOUNTER — Inpatient Hospital Stay: Payer: Medicare Other

## 2022-02-08 VITALS — BP 158/60 | HR 73 | Temp 97.7°F | Resp 18 | Ht 64.0 in | Wt 156.5 lb

## 2022-02-08 DIAGNOSIS — K862 Cyst of pancreas: Secondary | ICD-10-CM | POA: Insufficient documentation

## 2022-02-08 DIAGNOSIS — D649 Anemia, unspecified: Secondary | ICD-10-CM | POA: Insufficient documentation

## 2022-02-08 DIAGNOSIS — M85859 Other specified disorders of bone density and structure, unspecified thigh: Secondary | ICD-10-CM | POA: Insufficient documentation

## 2022-02-08 DIAGNOSIS — Z8673 Personal history of transient ischemic attack (TIA), and cerebral infarction without residual deficits: Secondary | ICD-10-CM | POA: Insufficient documentation

## 2022-02-08 DIAGNOSIS — D473 Essential (hemorrhagic) thrombocythemia: Secondary | ICD-10-CM

## 2022-02-08 DIAGNOSIS — E11319 Type 2 diabetes mellitus with unspecified diabetic retinopathy without macular edema: Secondary | ICD-10-CM | POA: Diagnosis not present

## 2022-02-08 DIAGNOSIS — R161 Splenomegaly, not elsewhere classified: Secondary | ICD-10-CM | POA: Insufficient documentation

## 2022-02-08 DIAGNOSIS — E876 Hypokalemia: Secondary | ICD-10-CM | POA: Insufficient documentation

## 2022-02-08 DIAGNOSIS — Z800 Family history of malignant neoplasm of digestive organs: Secondary | ICD-10-CM | POA: Insufficient documentation

## 2022-02-08 DIAGNOSIS — E119 Type 2 diabetes mellitus without complications: Secondary | ICD-10-CM | POA: Insufficient documentation

## 2022-02-08 LAB — COMPREHENSIVE METABOLIC PANEL
Albumin: 4 (ref 3.5–5.0)
Calcium: 9.1 (ref 8.7–10.7)

## 2022-02-08 LAB — CBC AND DIFFERENTIAL
HCT: 31 — AB (ref 36–46)
Hemoglobin: 10.5 — AB (ref 12.0–16.0)
Neutrophils Absolute: 2.52
Platelets: 291 10*3/uL (ref 150–400)
WBC: 4

## 2022-02-08 LAB — BASIC METABOLIC PANEL
BUN: 8 (ref 4–21)
CO2: 28 — AB (ref 13–22)
Chloride: 107 (ref 99–108)
Creatinine: 0.7 (ref 0.5–1.1)
Glucose: 71
Potassium: 3.4 mEq/L — AB (ref 3.5–5.1)
Sodium: 141 (ref 137–147)

## 2022-02-08 LAB — HEPATIC FUNCTION PANEL
ALT: 29 U/L (ref 7–35)
AST: 31 (ref 13–35)
Alkaline Phosphatase: 69 (ref 25–125)
Bilirubin, Total: 0.5

## 2022-02-08 LAB — CBC: RBC: 3.38 — AB (ref 3.87–5.11)

## 2022-02-08 NOTE — Progress Notes (Signed)
Allison Stevens  [address] [place],  [place]  [address] [phone]  Clinic Day: 02/08/22  Referring physician: Townsend Roger, MD  ASSESSMENT & PLAN:  Assessment & Plan: Essential thrombocytosis ([place]) Essential thrombocythemia, well controlled with hydroxyurea 500 mg 3 times a day, which she will continue. She is on chronic opioids due to bone pain.   Anemia Her hemoglobin improved from 9.9 to 10.5 during this visit. She last had colonoscopy in September 2017 and had a tubular adenoma removed from the sigmoid colon. She will need to have this repeated sometime soon.   Family history of pancreatic cancer First-degree relative, brother, with pancreatic cancer.  He declined genetic testing.  Genetic testing has been recommended for the patient, but not done due to lack of insurance coverage.   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 This has slowly increased since 2016.  Her last MRI of the abdomen in September 2021 was stable and was recommended to follow up in 2 years. I will schedule that in 3 months prior to her next visit here.   History of colon polyps She had a tubular adenoma resected from the sigmoid colon and September 2017.   Hypokalemia Potassium was mildly low at 3.4. so she will increase her potassium in her diet.   I do think she needs to see Dr. Lyda Jester for repeat colonoscopy.  She had a tubular adenoma removed from the sigmoid colon in 2017.  I will decrease her oxycontin from 40 mg every 12 hours to 20 mg every 12 hours. We will see her back in 3 months with CBC and CMP along with MRI abdomen prior to her visit. Her annual mammogram is due November 2023. The patient understands the plans discussed today and is in agreement with them.  She knows to contact our office if she develops concerns prior to her next appointment.     Hosie Poisson MD  [facility] AT [facility] [address] [place] [address] Dept: [phone] Dept Fax: [phone]      CHIEF COMPLAINT:  CC: Essential thrombocythemia  Current Treatment:  Hydroxyurea 500 mg 3 times daily   HISTORY OF PRESENT ILLNESS:  Allison Stevens is a 56 y.o. female with a history of essential thrombocythemia originally diagnosed in January 2005.  This has been treated with hydroxyurea, but the patient has often been non-compliant.  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  She has persistent pain in the left upper quadrant, attributed to splenomegaly, as well as bone pain, for which she remains on OxyContin with oxycodone for breakthrough.  We have tapered her opioids down over time. She saw Dr. Lacinda Axon, her gastroenterologist at [facility], and underwent an esophageal dilatation in August 2016.  She had already had a colonoscopy due to previous gastrointestinal complaints.  We have been following a lesion in the pancreas seen incidentally on CT done in the emergency room for abdominal pain.  This has been felt to be a benign cyst.  Repeat MRI abdomen in November 2016, revealed a slight increase in the benign appearing cyst of the pancreas.  No splenomegaly was seen.  Repeat MRI abdomen in 1 year was recommended.  She was hospitalized in September 2017 and underwent colonoscopy with removal of a small polyp in the sigmoid colon.  Small internal hemorrhoids were also seen.  Dr. Lyndel Safe also obtained a CEA  and CA 19-9, and. the CA 19-9 was elevated at 145, the CEA was normal. Repeat MRI abdomen with contrast in September 2017 revealed a stable 14 mm cystic lesion in the pancreas.  Again, no splenomegaly was seen.  Follow-up MRI in 1 year was recommended.  Repeat MRI abdomen in March 2019 revealed a 17 mm cystic lesion in the pancreatic uncinate process, which was felt to be stable.  No significant liver lesions were seen.  Repeat MRI abdomen  in 1 year was recommended.  Repeat MRI abdomen in September 2019 revealed a stable 19 mm cystic lesion of the pancreas, which appeared to be benign and still felt to be stable.  Repeat MRI abdomen with and without contrast in 1 year was recommended.  MRI abdomen in October 2020 revealed a stable 17 mm cystic lesion of the pancreas.  Continued yearly follow up MRI abdomen was recommended.   Screening mammogram in February 2016 revealed a possible mass in the right breast.  Right diagnostic mammogram and ultrasound was done and revealed 2 well circumscribed hypoechoic lesions in the right breast at 11 o 'clock, felt to represent cysts.  Repeat mammogram and ultrasound in August revealed heterogeneous fibroglandular tissue felt to be benign, with resolution of the previously seen cysts and no masses.  Repeat diagnostic bilateral mammogram and ultrasound in 6 months was once again recommended.  Bilateral diagnostic mammogram in March 2017 did not reveal any evidence of malignancy, so 1 year follow-up screening mammogram was recommended.  Bilateral screening mammogram in March 2018 did not reveal any evidence of malignancy.  Bone density scan in June 2018 revealed osteopenia with a T-score of -1.5 in the femur.  The patient was instructed to take calcium and vitamin-D twice daily.  The patient's family history is significant in that her father had pancreatic cancer at age 25, a paternal uncle had pancreatic cancer and a paternal aunt had pancreatic cancer.  Her brother also had pancreatic cancer at age 59.  She is appropriate for genetic testing, but this has not been done yet due to insurance coverage.  Bilateral screening mammogram in December 2020 did not reveal any evidence of malignancy.    Repeat MRI abdomen in September 2021 revealed a 1.9 cm cystic lesion in the pancreatic uncinate process, which showed no significant change compared to more recent exams, but has shown a slow increase in size since 2016. This  was suspicious for an indolent cystic neoplasm, such as a side-branch intraductal papillary mucinous neoplasm. Follow-up by MRI in 2 years was recommended. She reports having another stroke in May 2022.  She then had COVID later in May and was hospitalized due to this.  She had been on hydroxyurea 4 times a day, but this was reduced in September to 3 times a day as her platelets were controlled and she was more anemic with a hemoglobin of 10. Repeat CBC in November revealed her hemoglobin to be back up to 11.6.  Her last colonoscopy was in September 2017 and she did have a polypectomy of a tubular adenoma of the sigmoid colon at that time.  He also had an EGD in April of 2018, which showed mild gastritis, a small hiatal hernia, and a Schatzki ring.  She also had esophageal dilation.  INTERVAL HISTORY:  Allison Stevens is here today for repeat clinical assessment and states she has been doing relatively well since her last visit.  She states she continues hydroxyurea at least 3 times daily and reports no new  issues while taking it..She had a bilateral screening mammogram in November of 2022 that did not reveal any evidence of malignancy. She is disabled due to her multiple strokes.She reports her appetite is normal. She continues having trouble with walking, along with falling since her last stroke.Her platelets are stable. She has a history of a cyst on her pancreas that we are monitoring. She has a family medical history of pancreatic cancer. Last MRI was completed September 2021, and the radiologist recommended a repeat in 2 years. We will decrease her dosage of OxyContin from 40 mg to 20 mg.Her hemoglobin improved from 9.9 to 10.5. She denies any bleeding, melena, hematochezia or hematemesis.  She denies other neurologic symptoms, such as headaches, vision changes, new paresthesias or weakness. CMP reveals liver tests are back to normal. The only abnormality is mild hypokalemia and so we will recommend she increase  potassium in her diet.  She denies fevers or chills. She denies pain. Her appetite is good. Her weight has decreased by 3 lb's over the last 3 months.   Wt Readings from Last 3 Encounters:  02/08/22 156 lb 8 oz (71 kg)  11/09/21 159 lb 6.4 oz (72.3 kg)  08/09/21 155 lb 6.4 oz (70.5 kg)    REVIEW OF SYSTEMS:  Review of Systems  Constitutional:  Negative for appetite change, chills, fatigue, fever and unexpected weight change.  HENT:   Negative for lump/mass, mouth sores and sore throat.   Respiratory:  Negative for cough and shortness of breath.   Cardiovascular:  Negative for chest pain and leg swelling.  Gastrointestinal:  Negative for abdominal pain, constipation, diarrhea, nausea and vomiting.  Endocrine: Negative for hot flashes.  Genitourinary:  Negative for difficulty urinating, dysuria, frequency and hematuria.   Musculoskeletal:  Positive for arthralgias (right hip since fall) and gait problem (limping due to previous stroke). Negative for back pain and myalgias.  Skin:  Negative for rash.  Neurological:  Positive for gait problem (limping due to previous stroke). Negative for dizziness, headaches and numbness.  Hematological:  Negative for adenopathy. Bruises/bleeds easily.  Psychiatric/Behavioral:  Negative for depression and sleep disturbance. The patient is not nervous/anxious.      VITALS:  Blood pressure (!) 158/60, pulse 73, temperature 97.7 F (36.5 C), temperature source Oral, resp. rate 18, height '5\' 4"'$  (1.626 m), weight 156 lb 8 oz (71 kg), SpO2 98 %.  Wt Readings from Last 3 Encounters:  02/08/22 156 lb 8 oz (71 kg)  11/09/21 159 lb 6.4 oz (72.3 kg)  08/09/21 155 lb 6.4 oz (70.5 kg)    Body mass index is 26.86 kg/m.  Performance status (ECOG): 1 - Symptomatic but completely ambulatory  PHYSICAL EXAM:  Physical Exam Vitals and nursing note reviewed.  Constitutional:      General: She is not in acute distress.    Appearance: Normal appearance.  HENT:      Head: Normocephalic and atraumatic.     Mouth/Throat:     Mouth: Mucous membranes are moist.     Pharynx: Oropharynx is clear. No oropharyngeal exudate or posterior oropharyngeal erythema.  Eyes:     General: No scleral icterus.    Extraocular Movements: Extraocular movements intact.     Conjunctiva/sclera: Conjunctivae normal.     Pupils: Pupils are equal, round, and reactive to light.  Cardiovascular:     Rate and Rhythm: Normal rate and regular rhythm.     Heart sounds: Normal heart sounds. No murmur heard.    No friction  rub. No gallop.  Pulmonary:     Effort: Pulmonary effort is normal.     Breath sounds: Normal breath sounds. No wheezing, rhonchi or rales.  Abdominal:     General: There is no distension.     Palpations: Abdomen is soft. There is no hepatomegaly, splenomegaly or mass.     Tenderness: There is no abdominal tenderness.  Musculoskeletal:        General: Normal range of motion.     Cervical back: Normal range of motion and neck supple. No tenderness.     Right lower leg: No edema.     Left lower leg: No edema.  Lymphadenopathy:     Cervical: No cervical adenopathy.     Upper Body:     Right upper body: No supraclavicular or axillary adenopathy.     Left upper body: No supraclavicular or axillary adenopathy.     Lower Body: No right inguinal adenopathy. No left inguinal adenopathy.  Skin:    General: Skin is warm and dry.     Coloration: Skin is not jaundiced.     Findings: No rash.  Neurological:     Mental Status: She is alert and oriented to person, place, and time.     Cranial Nerves: No cranial nerve deficit.  Psychiatric:        Mood and Affect: Mood normal.        Behavior: Behavior normal.        Thought Content: Thought content normal.    LABS:      Latest Ref Rng & Units 02/08/2022   12:00 AM 11/09/2021   12:00 AM 08/09/2021   12:00 AM  CBC  WBC  4.0     4.6     5.0   Hemoglobin 12.0 - 16.0 10.5     9.8     10.4   Hematocrit 36 - 46 31      30     32   Platelets 150 - 400 K/uL 291     323     343      This result is from an external source.      Latest Ref Rng & Units 02/08/2022   12:00 AM 11/09/2021   12:00 AM 08/09/2021   12:00 AM  CMP  BUN 4 - '21 8     8     9   '$ Creatinine 0.5 - 1.1 0.7     0.6     0.8   Sodium 137 - 147 141     142     140   Potassium 3.5 - 5.1 mEq/L 3.4     3.7     4.2   Chloride 99 - 108 107     106     109   CO2 13 - '22 28     31     26   '$ Calcium 8.7 - 10.7 9.1     8.9     9.1   Alkaline Phos 25 - 125 69     68     77   AST 13 - 35 31     42     36   ALT 7 - 35 U/L 29     41     34      This result is from an external source.     No results found for: "CEA1", "CEA" / No results found for: "CEA1", "CEA" No results found for: "PSA1" No  results found for: "CAN199" No results found for: "CAN125"  No results found for: "TOTALPROTELP", "ALBUMINELP", "A1GS", "A2GS", "BETS", "BETA2SER", "GAMS", "MSPIKE", "SPEI" Lab Results  Component Value Date   TIBC 301 11/09/2021   FERRITIN 59 11/09/2021   IRONPCTSAT 26 11/09/2021   No results found for: "LDH"  STUDIES:   Exam(s): [id number] MAM/MAM DIGITAL SCREENING-BILAT 06/08/2021 CLINICAL DATA: Screening.  EXAM:  DIGITAL SCREENING BILATERAL MAMMOGRAM WITH CAD  TECHNIQUE:  Bilateral screening digital craniocaudal and mediolateral oblique  mammograms were obtained. The images were evaluated with  computer-aided detection.  COMPARISON: Previous exam(s).  ACR Breast Density Category b: There are scattered areas of  fibroglandular density.   FINDINGS:  There are no findings suspicious for malignancy.  IMPRESSION:  No mammographic evidence of malignancy. A result letter of this  screening mammogram will be mailed directly to the patient.  RECOMMENDATION:  Screening mammogram in one year. (Code:SM-B-01Y)  BI-RADS CATEGORY 1: Negative.   HISTORY:   Past Medical History:  Diagnosis Date   Acid reflux    Diabetes ([place]) 2002   Family history of  pancreatic cancer 08/09/2021   Gait abnormality    Hypertension    Migraine    Pancreatic cyst 08/09/2021   Stroke ([place])    Thrombocytosis     Past Surgical History:  Procedure Laterality Date   CESAREAN SECTION  1997   GALLBLADDER SURGERY  2011    Family History  Problem Relation Age of Onset   Diabetes Mother    Cancer Mother    Cancer Father    Healthy Child     Social History:  reports that she has never smoked. She has never used smokeless tobacco. She reports that she does not drink alcohol and does not use drugs.The patient is alone today.  Allergies:  Allergies  Allergen Reactions   Iodinated Contrast Media Other (See Comments) and Shortness Of Breath    Current Medications: Current Outpatient Medications  Medication Sig Dispense Refill   hydroxyurea (HYDREA) 500 MG capsule TAKE 1 CAPSULE BY MOUTH THREE TIMES A DAY 270 capsule 1   ALPRAZolam (XANAX) 0.5 MG tablet Take 0.5 mg by mouth at bedtime as needed for anxiety.     amLODipine (NORVASC) 10 MG tablet Take 10 mg by mouth daily.     aspirin 81 MG chewable tablet Chew 1 tablet by mouth daily.     carvedilol (COREG) 12.5 MG tablet Take 12.5 mg by mouth 2 (two) times daily.     clopidogrel (PLAVIX) 75 MG tablet Take 75 mg by mouth daily. (Patient not taking: Reported on 08/09/2021)     diclofenac Sodium (VOLTAREN) 1 % GEL Apply 4 g topically 4 (four) times daily.     enalapril (VASOTEC) 20 MG tablet Take 20 mg by mouth 2 (two) times daily.     escitalopram (LEXAPRO) 10 MG tablet Take 10 mg by mouth daily.     glimepiride (AMARYL) 2 MG tablet Take by mouth.     hydrochlorothiazide (HYDRODIURIL) 12.5 MG tablet Take 12.5 mg by mouth daily.     ibuprofen (ADVIL,MOTRIN) 800 MG tablet Take 800 mg by mouth every 8 (eight) hours as needed.     Insulin Detemir (LEVEMIR) 100 UNIT/ML Pen Inject into the skin.     lidocaine (XYLOCAINE) 1 % (with preservative) injection by Infiltration route.     metFORMIN (GLUCOPHAGE) 1000 MG  tablet Take 1,000 mg by mouth 2 (two) times daily with a meal.     metoCLOPramide (REGLAN) 5 MG  tablet Take 5 mg by mouth every 8 (eight) hours.     montelukast (SINGULAIR) 10 MG tablet Take 10 mg by mouth at bedtime.     niacin 500 MG tablet Take 500 mg by mouth at bedtime.     NOVOLOG FLEXPEN 100 UNIT/ML FlexPen SMARTSIG:34 Unit(s) SUB-Q 3 Times Daily     ondansetron (ZOFRAN-ODT) 4 MG disintegrating tablet TAKE 1 TABLET BY MOUTH EVERY 6 HOURS AS NEEDED FOR NAUSEA/VOMITING     oxyCODONE (OXYCONTIN) 20 mg 12 hr tablet Take 1 tablet (20 mg total) by mouth every 12 (twelve) hours. 60 tablet 0   Oxycodone HCl 10 MG TABS Take 1 tablet (10 mg total) by mouth every 4 (four) hours as needed. 120 tablet 0   pantoprazole (PROTONIX) 40 MG tablet Take 1 tablet (40 mg total) by mouth daily. 30 tablet 6   Potassium Chloride ER 20 MEQ TBCR Take 1 tablet by mouth daily.     pravastatin (PRAVACHOL) 40 MG tablet Take 40 mg by mouth daily.     prochlorperazine (COMPAZINE) 10 MG tablet Take 10 mg by mouth every 6 (six) hours as needed for nausea or vomiting.     sertraline (ZOLOFT) 25 MG tablet Take 25 mg by mouth daily.     spironolactone (ALDACTONE) 25 MG tablet Take 0.5 tablets by mouth daily.     TOUJEO MAX SOLOSTAR 300 UNIT/ML Solostar Pen SMARTSIG:80 Unit(s) SUB-Q Every Night     triamcinolone acetonide (TRIESENCE) 40 MG/ML SUSP Inject into the articular space.     TRULICITY 4.5 YT/1.1NB SOPN SMARTSIG:1 Pre-Filled Pen Syringe SUB-Q Once a Week     No current facility-administered medications for this visit.

## 2022-02-09 ENCOUNTER — Other Ambulatory Visit: Payer: Self-pay | Admitting: Oncology

## 2022-02-28 DIAGNOSIS — D649 Anemia, unspecified: Secondary | ICD-10-CM | POA: Diagnosis not present

## 2022-02-28 DIAGNOSIS — Z8673 Personal history of transient ischemic attack (TIA), and cerebral infarction without residual deficits: Secondary | ICD-10-CM | POA: Diagnosis not present

## 2022-02-28 DIAGNOSIS — E876 Hypokalemia: Secondary | ICD-10-CM | POA: Diagnosis not present

## 2022-02-28 DIAGNOSIS — Z800 Family history of malignant neoplasm of digestive organs: Secondary | ICD-10-CM | POA: Diagnosis not present

## 2022-02-28 DIAGNOSIS — D473 Essential (hemorrhagic) thrombocythemia: Secondary | ICD-10-CM | POA: Diagnosis not present

## 2022-02-28 DIAGNOSIS — K862 Cyst of pancreas: Secondary | ICD-10-CM | POA: Diagnosis not present

## 2022-02-28 DIAGNOSIS — M85859 Other specified disorders of bone density and structure, unspecified thigh: Secondary | ICD-10-CM | POA: Diagnosis not present

## 2022-02-28 DIAGNOSIS — R161 Splenomegaly, not elsewhere classified: Secondary | ICD-10-CM | POA: Diagnosis not present

## 2022-02-28 DIAGNOSIS — E119 Type 2 diabetes mellitus without complications: Secondary | ICD-10-CM | POA: Diagnosis not present

## 2022-03-01 ENCOUNTER — Other Ambulatory Visit: Payer: Self-pay

## 2022-03-01 DIAGNOSIS — M898X9 Other specified disorders of bone, unspecified site: Secondary | ICD-10-CM

## 2022-03-01 DIAGNOSIS — R161 Splenomegaly, not elsewhere classified: Secondary | ICD-10-CM

## 2022-03-01 MED ORDER — OXYCODONE HCL ER 20 MG PO T12A: 20.0000 mg | EXTENDED_RELEASE_TABLET | Freq: Two times a day (BID) | ORAL | 0 refills | Status: DC

## 2022-03-02 ENCOUNTER — Other Ambulatory Visit: Payer: Self-pay

## 2022-03-02 ENCOUNTER — Other Ambulatory Visit: Payer: Self-pay | Admitting: Hematology and Oncology

## 2022-03-02 MED ORDER — OXYCODONE HCL 10 MG PO TABS
10.0000 mg | ORAL_TABLET | ORAL | 0 refills | Status: DC | PRN
Start: 2022-03-02 — End: 2022-03-02

## 2022-03-02 MED ORDER — OXYCODONE HCL 10 MG PO TABS
10.0000 mg | ORAL_TABLET | ORAL | 0 refills | Status: DC | PRN
Start: 2022-03-02 — End: 2022-04-12

## 2022-03-12 ENCOUNTER — Encounter: Payer: Self-pay | Admitting: Oncology

## 2022-03-31 ENCOUNTER — Other Ambulatory Visit: Payer: Self-pay

## 2022-03-31 DIAGNOSIS — R161 Splenomegaly, not elsewhere classified: Secondary | ICD-10-CM

## 2022-03-31 DIAGNOSIS — M898X9 Other specified disorders of bone, unspecified site: Secondary | ICD-10-CM

## 2022-04-01 MED ORDER — OXYCODONE HCL ER 20 MG PO T12A: 20.0000 mg | EXTENDED_RELEASE_TABLET | Freq: Two times a day (BID) | ORAL | 0 refills | Status: DC

## 2022-04-06 ENCOUNTER — Other Ambulatory Visit: Payer: Self-pay | Admitting: Hematology and Oncology

## 2022-04-06 DIAGNOSIS — D473 Essential (hemorrhagic) thrombocythemia: Secondary | ICD-10-CM

## 2022-04-12 ENCOUNTER — Other Ambulatory Visit: Payer: Self-pay

## 2022-04-12 DIAGNOSIS — D125 Benign neoplasm of sigmoid colon: Secondary | ICD-10-CM

## 2022-04-12 MED ORDER — OXYCODONE HCL 10 MG PO TABS: 10.0000 mg | ORAL_TABLET | ORAL | 0 refills | Status: DC | PRN

## 2022-04-21 DIAGNOSIS — R350 Frequency of micturition: Secondary | ICD-10-CM | POA: Diagnosis not present

## 2022-05-02 ENCOUNTER — Other Ambulatory Visit: Payer: Self-pay

## 2022-05-02 ENCOUNTER — Telehealth: Payer: Self-pay

## 2022-05-02 ENCOUNTER — Other Ambulatory Visit: Payer: Self-pay | Admitting: Hematology and Oncology

## 2022-05-02 DIAGNOSIS — D125 Benign neoplasm of sigmoid colon: Secondary | ICD-10-CM

## 2022-05-02 DIAGNOSIS — R161 Splenomegaly, not elsewhere classified: Secondary | ICD-10-CM

## 2022-05-02 DIAGNOSIS — M898X9 Other specified disorders of bone, unspecified site: Secondary | ICD-10-CM

## 2022-05-02 MED ORDER — OXYCODONE HCL ER 20 MG PO T12A: 20.0000 mg | EXTENDED_RELEASE_TABLET | Freq: Two times a day (BID) | ORAL | 0 refills | Status: DC

## 2022-05-02 NOTE — Telephone Encounter (Signed)
The last oxycodone '10mg'$  fill was on 04/12/2022. Pt notified that she will need to wait a few days to get a refill. She verbalized understanding.

## 2022-05-05 DIAGNOSIS — M79641 Pain in right hand: Secondary | ICD-10-CM | POA: Diagnosis not present

## 2022-05-06 DIAGNOSIS — F419 Anxiety disorder, unspecified: Secondary | ICD-10-CM | POA: Diagnosis not present

## 2022-05-06 DIAGNOSIS — E11319 Type 2 diabetes mellitus with unspecified diabetic retinopathy without macular edema: Secondary | ICD-10-CM | POA: Diagnosis not present

## 2022-05-06 DIAGNOSIS — I10 Essential (primary) hypertension: Secondary | ICD-10-CM | POA: Diagnosis not present

## 2022-05-09 ENCOUNTER — Other Ambulatory Visit: Payer: Self-pay

## 2022-05-09 ENCOUNTER — Other Ambulatory Visit: Payer: Medicare Other

## 2022-05-09 DIAGNOSIS — D125 Benign neoplasm of sigmoid colon: Secondary | ICD-10-CM

## 2022-05-09 DIAGNOSIS — M898X9 Other specified disorders of bone, unspecified site: Secondary | ICD-10-CM

## 2022-05-09 MED ORDER — OXYCODONE HCL 10 MG PO TABS: 10.0000 mg | ORAL_TABLET | ORAL | 0 refills | Status: DC | PRN

## 2022-05-11 ENCOUNTER — Ambulatory Visit: Payer: Medicare Other | Admitting: Oncology

## 2022-05-30 ENCOUNTER — Inpatient Hospital Stay: Payer: Medicare Other | Attending: Oncology

## 2022-05-30 DIAGNOSIS — D649 Anemia, unspecified: Secondary | ICD-10-CM | POA: Diagnosis not present

## 2022-05-30 DIAGNOSIS — D473 Essential (hemorrhagic) thrombocythemia: Secondary | ICD-10-CM

## 2022-05-30 DIAGNOSIS — K862 Cyst of pancreas: Secondary | ICD-10-CM | POA: Diagnosis not present

## 2022-05-30 LAB — COMPREHENSIVE METABOLIC PANEL
Albumin: 4.2 (ref 3.5–5.0)
Calcium: 9.2 (ref 8.7–10.7)

## 2022-05-30 LAB — HEPATIC FUNCTION PANEL
ALT: 33 U/L (ref 7–35)
AST: 33 (ref 13–35)
Alkaline Phosphatase: 73 (ref 25–125)
Bilirubin, Total: 0.6

## 2022-05-30 LAB — LIPID PANEL
Cholesterol: 156 mg/dL (ref 0–200)
HDL: 36 mg/dL — ABNORMAL LOW (ref >40)
LDL Cholesterol: 106 mg/dL — ABNORMAL HIGH (ref 0–99)
Total CHOL/HDL Ratio: 4.3 RATIO
Triglycerides: 72 mg/dL (ref <150)
VLDL: 14 mg/dL (ref 0–40)

## 2022-05-30 LAB — CBC AND DIFFERENTIAL
HCT: 30 — AB (ref 36–46)
Hemoglobin: 10.3 — AB (ref 12.0–16.0)
Neutrophils Absolute: 2.84
Platelets: 290 10*3/uL (ref 150–400)
WBC: 4

## 2022-05-30 LAB — BASIC METABOLIC PANEL
BUN: 9 (ref 4–21)
CO2: 29 — AB (ref 13–22)
Chloride: 107 (ref 99–108)
Creatinine: 0.7 (ref 0.5–1.1)
Glucose: 139
Potassium: 3.9 mEq/L (ref 3.5–5.1)
Sodium: 141 (ref 137–147)

## 2022-05-30 LAB — CBC: RBC: 3.27 — AB (ref 3.87–5.11)

## 2022-05-31 ENCOUNTER — Other Ambulatory Visit: Payer: Self-pay

## 2022-05-31 DIAGNOSIS — R161 Splenomegaly, not elsewhere classified: Secondary | ICD-10-CM

## 2022-05-31 DIAGNOSIS — M898X9 Other specified disorders of bone, unspecified site: Secondary | ICD-10-CM

## 2022-05-31 LAB — HEMOGLOBIN A1C
Hgb A1c MFr Bld: 7.1 % — ABNORMAL HIGH (ref 4.8–5.6)
Mean Plasma Glucose: 157 mg/dL

## 2022-05-31 MED ORDER — OXYCODONE HCL ER 20 MG PO T12A: 20.0000 mg | EXTENDED_RELEASE_TABLET | Freq: Two times a day (BID) | ORAL | 0 refills | Status: DC

## 2022-05-31 MED ORDER — OXYCODONE HCL 10 MG PO TABS: 10.0000 mg | ORAL_TABLET | ORAL | 0 refills | Status: DC | PRN

## 2022-06-02 ENCOUNTER — Ambulatory Visit: Payer: Medicare Other | Admitting: Oncology

## 2022-06-03 ENCOUNTER — Other Ambulatory Visit: Payer: Self-pay | Admitting: Student

## 2022-06-03 DIAGNOSIS — R300 Dysuria: Secondary | ICD-10-CM | POA: Diagnosis not present

## 2022-06-03 DIAGNOSIS — E162 Hypoglycemia, unspecified: Secondary | ICD-10-CM | POA: Diagnosis not present

## 2022-06-07 DIAGNOSIS — K862 Cyst of pancreas: Secondary | ICD-10-CM | POA: Diagnosis not present

## 2022-06-07 DIAGNOSIS — K449 Diaphragmatic hernia without obstruction or gangrene: Secondary | ICD-10-CM | POA: Diagnosis not present

## 2022-06-07 DIAGNOSIS — Z9049 Acquired absence of other specified parts of digestive tract: Secondary | ICD-10-CM | POA: Diagnosis not present

## 2022-06-07 LAB — URINE CULTURE

## 2022-06-10 ENCOUNTER — Encounter: Payer: Self-pay | Admitting: Oncology

## 2022-06-12 NOTE — Progress Notes (Unsigned)
Allison Stevens  [address] [place],  [place]  [address] [phone]  Clinic Day: 02/08/22  Referring physician: Townsend Roger, MD  ASSESSMENT & PLAN:  Assessment & Plan: Essential thrombocytosis ([place]) Essential thrombocythemia, well controlled with hydroxyurea 500 mg 3 times a day, which she will continue. She is on chronic opioids due to bone pain.   Anemia Her hemoglobin improved from 9.9 to 10.5 during this visit. She last had colonoscopy in September 2017 and had a tubular adenoma removed from the sigmoid colon. She will need to have this repeated sometime soon.   Family history of pancreatic cancer First-degree relative, brother, with pancreatic cancer.  He declined genetic testing.  Genetic testing has been recommended for the patient, but not done due to lack of insurance coverage.   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 This has slowly increased since 2016.  Her last MRI of the abdomen in September 2021 was stable and was recommended to follow up in 2 years. I will schedule that in 3 months prior to her next visit here.   History of colon polyps She had a tubular adenoma resected from the sigmoid colon and September 2017.   Hypokalemia Potassium was mildly low at 3.4. so she will increase her potassium in her diet.   I do think she needs to see Dr. Lyda Jester for repeat colonoscopy.  She had a tubular adenoma removed from the sigmoid colon in 2017.  I will decrease her oxycontin from 40 mg every 12 hours to 20 mg every 12 hours. We will see her back in 3 months with CBC and CMP along with MRI abdomen prior to her visit. Her annual mammogram is due November 2023. The patient understands the plans discussed today and is in agreement with them.  She knows to contact our office if she develops concerns prior to her next appointment.     Hosie Poisson MD  [facility] AT [facility] [address] [place] [address] Dept: [phone] Dept Fax: [phone]      CHIEF COMPLAINT:  CC: Essential thrombocythemia  Current Treatment:  Hydroxyurea 500 mg 3 times daily   HISTORY OF PRESENT ILLNESS:  Allison Stevens is a 56 y.o. female with a history of essential thrombocythemia originally diagnosed in January 2005.  This has been treated with hydroxyurea, but the patient has often been non-compliant.  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  She has persistent pain in the left upper quadrant, attributed to splenomegaly, as well as bone pain, for which she remains on OxyContin with oxycodone for breakthrough.  We have tapered her opioids down over time. She saw Dr. Lacinda Axon, her gastroenterologist at [facility], and underwent an esophageal dilatation in August 2016.  She had already had a colonoscopy due to previous gastrointestinal complaints.  We have been following a lesion in the pancreas seen incidentally on CT done in the emergency room for abdominal pain.  This has been felt to be a benign cyst.  Repeat MRI abdomen in November 2016, revealed a slight increase in the benign appearing cyst of the pancreas.  No splenomegaly was seen.  Repeat MRI abdomen in 1 year was recommended.  She was hospitalized in September 2017 and underwent colonoscopy with removal of a small polyp in the sigmoid colon.  Small internal hemorrhoids were also seen.  Dr. Lyndel Safe also obtained a CEA  and CA 19-9, and. the CA 19-9 was elevated at 145, the CEA was normal. Repeat MRI abdomen with contrast in September 2017 revealed a stable 14 mm cystic lesion in the pancreas.  Again, no splenomegaly was seen.  Follow-up MRI in 1 year was recommended.  Repeat MRI abdomen in March 2019 revealed a 17 mm cystic lesion in the pancreatic uncinate process, which was felt to be stable.  No significant liver lesions were seen.  Repeat MRI abdomen  in 1 year was recommended.  Repeat MRI abdomen in September 2019 revealed a stable 19 mm cystic lesion of the pancreas, which appeared to be benign and still felt to be stable.  Repeat MRI abdomen with and without contrast in 1 year was recommended.  MRI abdomen in October 2020 revealed a stable 17 mm cystic lesion of the pancreas.  Continued yearly follow up MRI abdomen was recommended.   Screening mammogram in February 2016 revealed a possible mass in the right breast.  Right diagnostic mammogram and ultrasound was done and revealed 2 well circumscribed hypoechoic lesions in the right breast at 11 o 'clock, felt to represent cysts.  Repeat mammogram and ultrasound in August revealed heterogeneous fibroglandular tissue felt to be benign, with resolution of the previously seen cysts and no masses.  Repeat diagnostic bilateral mammogram and ultrasound in 6 months was once again recommended.  Bilateral diagnostic mammogram in March 2017 did not reveal any evidence of malignancy, so 1 year follow-up screening mammogram was recommended.  Bilateral screening mammogram in March 2018 did not reveal any evidence of malignancy.  Bone density scan in June 2018 revealed osteopenia with a T-score of -1.5 in the femur.  The patient was instructed to take calcium and vitamin-D twice daily.  The patient's family history is significant in that her father had pancreatic cancer at age 25, a paternal uncle had pancreatic cancer and a paternal aunt had pancreatic cancer.  Her brother also had pancreatic cancer at age 59.  She is appropriate for genetic testing, but this has not been done yet due to insurance coverage.  Bilateral screening mammogram in December 2020 did not reveal any evidence of malignancy.    Repeat MRI abdomen in September 2021 revealed a 1.9 cm cystic lesion in the pancreatic uncinate process, which showed no significant change compared to more recent exams, but has shown a slow increase in size since 2016. This  was suspicious for an indolent cystic neoplasm, such as a side-branch intraductal papillary mucinous neoplasm. Follow-up by MRI in 2 years was recommended. She reports having another stroke in May 2022.  She then had COVID later in May and was hospitalized due to this.  She had been on hydroxyurea 4 times a day, but this was reduced in September to 3 times a day as her platelets were controlled and she was more anemic with a hemoglobin of 10. Repeat CBC in November revealed her hemoglobin to be back up to 11.6.  Her last colonoscopy was in September 2017 and she did have a polypectomy of a tubular adenoma of the sigmoid colon at that time.  He also had an EGD in April of 2018, which showed mild gastritis, a small hiatal hernia, and a Schatzki ring.  She also had esophageal dilation.  INTERVAL HISTORY:  Harmoni is here today for repeat clinical assessment and states she has been doing relatively well since her last visit.  She states she continues hydroxyurea at least 3 times daily and reports no new  issues while taking it..She had a bilateral screening mammogram in November of 2022 that did not reveal any evidence of malignancy. She is disabled due to her multiple strokes.She reports her appetite is normal. She continues having trouble with walking, along with falling since her last stroke.Her platelets are stable. She has a history of a cyst on her pancreas that we are monitoring. She has a family medical history of pancreatic cancer. Last MRI was completed September 2021, and the radiologist recommended a repeat in 2 years. We will decrease her dosage of OxyContin from 40 mg to 20 mg.Her hemoglobin improved from 9.9 to 10.5. She denies any bleeding, melena, hematochezia or hematemesis.  She denies other neurologic symptoms, such as headaches, vision changes, new paresthesias or weakness. CMP reveals liver tests are back to normal. The only abnormality is mild hypokalemia and so we will recommend she increase  potassium in her diet.  She denies fevers or chills. She denies pain. Her appetite is good. Her weight has decreased by 3 lb's over the last 3 months.   Wt Readings from Last 3 Encounters:  02/08/22 156 lb 8 oz (71 kg)  11/09/21 159 lb 6.4 oz (72.3 kg)  08/09/21 155 lb 6.4 oz (70.5 kg)    REVIEW OF SYSTEMS:  Review of Systems  Constitutional: Negative.  Negative for appetite change, chills, fatigue, fever and unexpected weight change.  HENT:  Negative.  Negative for lump/mass, mouth sores and sore throat.   Eyes: Negative.   Respiratory: Negative.  Negative for cough and shortness of breath.   Cardiovascular: Negative.  Negative for chest pain and leg swelling.  Gastrointestinal: Negative.  Negative for abdominal pain, constipation, diarrhea, nausea and vomiting.  Endocrine: Negative.  Negative for hot flashes.  Genitourinary: Negative.  Negative for difficulty urinating, dysuria, frequency and hematuria.   Musculoskeletal:  Positive for arthralgias (right hip since fall) and gait problem (limping due to previous stroke). Negative for back pain and myalgias.  Skin:  Negative for rash.  Neurological:  Positive for gait problem (limping due to previous stroke). Negative for dizziness, headaches and numbness.  Hematological:  Negative for adenopathy. Bruises/bleeds easily.  Psychiatric/Behavioral:  Negative for depression and sleep disturbance. The patient is not nervous/anxious.      VITALS:  There were no vitals taken for this visit.  Wt Readings from Last 3 Encounters:  02/08/22 156 lb 8 oz (71 kg)  11/09/21 159 lb 6.4 oz (72.3 kg)  08/09/21 155 lb 6.4 oz (70.5 kg)    There is no height or weight on file to calculate BMI.  Performance status (ECOG): 1 - Symptomatic but completely ambulatory  PHYSICAL EXAM:  Physical Exam Vitals and nursing note reviewed.  Constitutional:      General: She is not in acute distress.    Appearance: Normal appearance.  HENT:     Head:  Normocephalic and atraumatic.     Mouth/Throat:     Mouth: Mucous membranes are moist.     Pharynx: Oropharynx is clear. No oropharyngeal exudate or posterior oropharyngeal erythema.  Eyes:     General: No scleral icterus.    Extraocular Movements: Extraocular movements intact.     Conjunctiva/sclera: Conjunctivae normal.     Pupils: Pupils are equal, round, and reactive to light.  Cardiovascular:     Rate and Rhythm: Normal rate and regular rhythm.     Heart sounds: Normal heart sounds. No murmur heard.    No friction rub. No gallop.  Pulmonary:  Effort: Pulmonary effort is normal.     Breath sounds: Normal breath sounds. No wheezing, rhonchi or rales.  Abdominal:     General: There is no distension.     Palpations: Abdomen is soft. There is no hepatomegaly, splenomegaly or mass.     Tenderness: There is no abdominal tenderness.  Musculoskeletal:        General: Normal range of motion.     Cervical back: Normal range of motion and neck supple. No tenderness.     Right lower leg: No edema.     Left lower leg: No edema.  Lymphadenopathy:     Cervical: No cervical adenopathy.     Upper Body:     Right upper body: No supraclavicular or axillary adenopathy.     Left upper body: No supraclavicular or axillary adenopathy.     Lower Body: No right inguinal adenopathy. No left inguinal adenopathy.  Skin:    General: Skin is warm and dry.     Coloration: Skin is not jaundiced.     Findings: No rash.  Neurological:     Mental Status: She is alert and oriented to person, place, and time.     Cranial Nerves: No cranial nerve deficit.  Psychiatric:        Mood and Affect: Mood normal.        Behavior: Behavior normal.        Thought Content: Thought content normal.    LABS:      Latest Ref Rng & Units 05/30/2022   12:00 AM 02/08/2022   12:00 AM 11/09/2021   12:00 AM  CBC  WBC  4.0     4.0     4.6      Hemoglobin 12.0 - 16.0 10.3     10.5     9.8      Hematocrit 36 - 46 '30      31     30      '$ Platelets 150 - 400 K/uL 290     291     323         This result is from an external source.       Latest Ref Rng & Units 05/30/2022   12:00 AM 02/08/2022   12:00 AM 11/09/2021   12:00 AM  CMP  BUN 4 - '21 9     8     8      '$ Creatinine 0.5 - 1.1 0.7     0.7     0.6      Sodium 137 - 147 141     141     142      Potassium 3.5 - 5.1 mEq/L 3.9     3.4     3.7      Chloride 99 - 108 107     107     106      CO2 13 - '22 29     28     31      '$ Calcium 8.7 - 10.7 9.2     9.1     8.9      Alkaline Phos 25 - 125 73     69     68      AST 13 - 35 33     31     42      ALT 7 - 35 U/L 33     29     41  This result is from an external source.      No results found for: "CEA1", "CEA" / No results found for: "CEA1", "CEA" No results found for: "PSA1" No results found for: "ZOX096" No results found for: "CAN125"  No results found for: "TOTALPROTELP", "ALBUMINELP", "A1GS", "A2GS", "BETS", "BETA2SER", "GAMS", "MSPIKE", "SPEI" Lab Results  Component Value Date   TIBC 301 11/09/2021   FERRITIN 59 11/09/2021   IRONPCTSAT 26 11/09/2021   No results found for: "LDH"  STUDIES:   Exam(s): [id number] MAM/MAM DIGITAL SCREENING-BILAT 06/08/2021 CLINICAL DATA: Screening.  EXAM:  DIGITAL SCREENING BILATERAL MAMMOGRAM WITH CAD  TECHNIQUE:  Bilateral screening digital craniocaudal and mediolateral oblique  mammograms were obtained. The images were evaluated with  computer-aided detection.  COMPARISON: Previous exam(s).  ACR Breast Density Category b: There are scattered areas of  fibroglandular density.   FINDINGS:  There are no findings suspicious for malignancy.  IMPRESSION:  No mammographic evidence of malignancy. A result letter of this  screening mammogram will be mailed directly to the patient.  RECOMMENDATION:  Screening mammogram in one year. (Code:SM-B-01Y)  BI-RADS CATEGORY 1: Negative.   HISTORY:   Past Medical History:  Diagnosis Date   Acid reflux     Diabetes ([place]) 2002   Family history of pancreatic cancer 08/09/2021   Gait abnormality    Hypertension    Migraine    Pancreatic cyst 08/09/2021   Stroke ([place])    Thrombocytosis     Past Surgical History:  Procedure Laterality Date   CESAREAN SECTION  1997   GALLBLADDER SURGERY  2011    Family History  Problem Relation Age of Onset   Diabetes Mother    Cancer Mother    Cancer Father    Healthy Child     Social History:  reports that she has never smoked. She has never used smokeless tobacco. She reports that she does not drink alcohol and does not use drugs.The patient is alone today.  Allergies:  Allergies  Allergen Reactions   Iodinated Contrast Media Other (See Comments) and Shortness Of Breath    Current Medications: Current Outpatient Medications  Medication Sig Dispense Refill   ALPRAZolam (XANAX) 0.5 MG tablet Take 0.5 mg by mouth at bedtime as needed for anxiety.     amLODipine (NORVASC) 10 MG tablet Take 10 mg by mouth daily.     aspirin 81 MG chewable tablet Chew 1 tablet by mouth daily.     carvedilol (COREG) 12.5 MG tablet Take 12.5 mg by mouth 2 (two) times daily.     clopidogrel (PLAVIX) 75 MG tablet Take 75 mg by mouth daily. (Patient not taking: Reported on 08/09/2021)     diclofenac Sodium (VOLTAREN) 1 % GEL Apply 4 g topically 4 (four) times daily.     enalapril (VASOTEC) 20 MG tablet Take 20 mg by mouth 2 (two) times daily.     escitalopram (LEXAPRO) 10 MG tablet Take 10 mg by mouth daily.     glimepiride (AMARYL) 2 MG tablet Take by mouth.     hydrochlorothiazide (HYDRODIURIL) 12.5 MG tablet Take 12.5 mg by mouth daily.     hydroxyurea (HYDREA) 500 MG capsule TAKE 1 CAPSULE BY MOUTH THREE TIMES A DAY 270 capsule 1   ibuprofen (ADVIL,MOTRIN) 800 MG tablet Take 800 mg by mouth every 8 (eight) hours as needed.     Insulin Detemir (LEVEMIR) 100 UNIT/ML Pen Inject into the skin.     lidocaine (XYLOCAINE) 1 % (with preservative) injection by Infiltration  route.     metFORMIN (GLUCOPHAGE) 1000 MG tablet Take 1,000 mg by mouth 2 (two) times daily with a meal.     metoCLOPramide (REGLAN) 5 MG tablet Take 5 mg by mouth every 8 (eight) hours.     montelukast (SINGULAIR) 10 MG tablet Take 10 mg by mouth at bedtime.     niacin 500 MG tablet Take 500 mg by mouth at bedtime.     NOVOLOG FLEXPEN 100 UNIT/ML FlexPen SMARTSIG:34 Unit(s) SUB-Q 3 Times Daily     ondansetron (ZOFRAN-ODT) 4 MG disintegrating tablet TAKE 1 TABLET BY MOUTH EVERY 6 HOURS AS NEEDED FOR NAUSEA/VOMITING     oxyCODONE (OXYCONTIN) 20 mg 12 hr tablet Take 1 tablet (20 mg total) by mouth every 12 (twelve) hours. 60 tablet 0   Oxycodone HCl 10 MG TABS Take 1 tablet (10 mg total) by mouth every 4 (four) hours as needed. 120 tablet 0   pantoprazole (PROTONIX) 40 MG tablet Take 1 tablet (40 mg total) by mouth daily. 30 tablet 6   Potassium Chloride ER 20 MEQ TBCR Take 1 tablet by mouth daily.     pravastatin (PRAVACHOL) 40 MG tablet Take 40 mg by mouth daily.     prochlorperazine (COMPAZINE) 10 MG tablet Take 10 mg by mouth every 6 (six) hours as needed for nausea or vomiting.     sertraline (ZOLOFT) 25 MG tablet Take 25 mg by mouth daily.     spironolactone (ALDACTONE) 25 MG tablet Take 0.5 tablets by mouth daily.     TOUJEO MAX SOLOSTAR 300 UNIT/ML Solostar Pen SMARTSIG:80 Unit(s) SUB-Q Every Night     triamcinolone acetonide (TRIESENCE) 40 MG/ML SUSP Inject into the articular space.     TRULICITY 4.5 QM/5.7QI SOPN SMARTSIG:1 Pre-Filled Pen Syringe SUB-Q Once a Week     No current facility-administered medications for this visit.

## 2022-06-13 ENCOUNTER — Inpatient Hospital Stay: Payer: Medicare Other | Attending: Oncology | Admitting: Oncology

## 2022-06-13 ENCOUNTER — Other Ambulatory Visit: Payer: Self-pay | Admitting: Oncology

## 2022-06-13 VITALS — BP 214/93 | HR 83 | Temp 98.2°F | Resp 18 | Ht 64.0 in | Wt 150.2 lb

## 2022-06-13 DIAGNOSIS — Z1231 Encounter for screening mammogram for malignant neoplasm of breast: Secondary | ICD-10-CM

## 2022-06-13 DIAGNOSIS — D539 Nutritional anemia, unspecified: Secondary | ICD-10-CM

## 2022-06-13 DIAGNOSIS — D473 Essential (hemorrhagic) thrombocythemia: Secondary | ICD-10-CM

## 2022-06-13 DIAGNOSIS — Z1239 Encounter for other screening for malignant neoplasm of breast: Secondary | ICD-10-CM

## 2022-06-14 ENCOUNTER — Telehealth: Payer: Self-pay

## 2022-06-14 ENCOUNTER — Other Ambulatory Visit: Payer: Self-pay

## 2022-06-14 DIAGNOSIS — D125 Benign neoplasm of sigmoid colon: Secondary | ICD-10-CM

## 2022-06-14 DIAGNOSIS — Z800 Family history of malignant neoplasm of digestive organs: Secondary | ICD-10-CM

## 2022-06-14 DIAGNOSIS — K862 Cyst of pancreas: Secondary | ICD-10-CM

## 2022-06-14 NOTE — Telephone Encounter (Signed)
-----   Message from Derwood Kaplan, MD sent at 06/13/2022  4:34 PM EST ----- Regarding: refer Pls refer to GI in [place] for EUS - only a few GI docs do this - Dr. Ardis Hughs is great, I think Dr. Paulita Fujita also does.  Dx is slowing growing mass of pancreas, send them recent MRI abdomen

## 2022-06-14 NOTE — Telephone Encounter (Signed)
Referral sent electronically

## 2022-06-29 ENCOUNTER — Other Ambulatory Visit: Payer: Self-pay

## 2022-06-29 DIAGNOSIS — R161 Splenomegaly, not elsewhere classified: Secondary | ICD-10-CM

## 2022-06-29 DIAGNOSIS — M898X9 Other specified disorders of bone, unspecified site: Secondary | ICD-10-CM

## 2022-06-29 MED ORDER — OXYCODONE HCL 10 MG PO TABS: 10.0000 mg | ORAL_TABLET | ORAL | 0 refills | Status: DC | PRN

## 2022-06-29 MED ORDER — OXYCODONE HCL ER 20 MG PO T12A: 20.0000 mg | EXTENDED_RELEASE_TABLET | Freq: Two times a day (BID) | ORAL | 0 refills | Status: DC

## 2022-07-27 ENCOUNTER — Other Ambulatory Visit: Payer: Self-pay

## 2022-07-27 DIAGNOSIS — R161 Splenomegaly, not elsewhere classified: Secondary | ICD-10-CM

## 2022-07-27 DIAGNOSIS — M898X9 Other specified disorders of bone, unspecified site: Secondary | ICD-10-CM

## 2022-07-27 MED ORDER — OXYCODONE HCL ER 20 MG PO T12A: 20.0000 mg | EXTENDED_RELEASE_TABLET | Freq: Two times a day (BID) | ORAL | 0 refills | Status: DC

## 2022-07-27 MED ORDER — OXYCODONE HCL 10 MG PO TABS: 10.0000 mg | ORAL_TABLET | ORAL | 0 refills | Status: DC | PRN

## 2022-07-29 ENCOUNTER — Other Ambulatory Visit: Payer: Self-pay | Admitting: Internal Medicine

## 2022-08-02 ENCOUNTER — Other Ambulatory Visit: Payer: Self-pay

## 2022-08-03 ENCOUNTER — Other Ambulatory Visit: Payer: Self-pay | Admitting: Internal Medicine

## 2022-08-03 MED ORDER — ALPRAZOLAM 0.5 MG PO TABS: 0.5000 mg | ORAL_TABLET | Freq: Three times a day (TID) | ORAL | 2 refills | Status: DC | PRN

## 2022-08-08 ENCOUNTER — Ambulatory Visit: Payer: Medicare Other | Admitting: Internal Medicine

## 2022-08-08 ENCOUNTER — Encounter: Payer: Self-pay | Admitting: Internal Medicine

## 2022-08-08 VITALS — BP 148/80 | HR 81 | Temp 98.0°F | Resp 16 | Ht 64.0 in | Wt 149.8 lb

## 2022-08-08 DIAGNOSIS — Z0000 Encounter for general adult medical examination without abnormal findings: Secondary | ICD-10-CM | POA: Diagnosis not present

## 2022-08-08 DIAGNOSIS — Z6825 Body mass index (BMI) 25.0-25.9, adult: Secondary | ICD-10-CM | POA: Diagnosis not present

## 2022-08-08 DIAGNOSIS — E11311 Type 2 diabetes mellitus with unspecified diabetic retinopathy with macular edema: Secondary | ICD-10-CM

## 2022-08-08 DIAGNOSIS — K862 Cyst of pancreas: Secondary | ICD-10-CM | POA: Diagnosis not present

## 2022-08-08 DIAGNOSIS — M6530 Trigger finger, unspecified finger: Secondary | ICD-10-CM | POA: Diagnosis not present

## 2022-08-08 DIAGNOSIS — E785 Hyperlipidemia, unspecified: Secondary | ICD-10-CM | POA: Insufficient documentation

## 2022-08-08 DIAGNOSIS — I10 Essential (primary) hypertension: Secondary | ICD-10-CM | POA: Diagnosis not present

## 2022-08-08 DIAGNOSIS — I679 Cerebrovascular disease, unspecified: Secondary | ICD-10-CM | POA: Diagnosis not present

## 2022-08-08 DIAGNOSIS — K219 Gastro-esophageal reflux disease without esophagitis: Secondary | ICD-10-CM | POA: Diagnosis not present

## 2022-08-08 DIAGNOSIS — D509 Iron deficiency anemia, unspecified: Secondary | ICD-10-CM | POA: Diagnosis not present

## 2022-08-08 DIAGNOSIS — F411 Generalized anxiety disorder: Secondary | ICD-10-CM | POA: Diagnosis not present

## 2022-08-08 DIAGNOSIS — D473 Essential (hemorrhagic) thrombocythemia: Secondary | ICD-10-CM

## 2022-08-08 MED ORDER — CLOPIDOGREL BISULFATE 75 MG PO TABS: 75.0000 mg | ORAL_TABLET | Freq: Every day | ORAL | 3 refills | Status: DC

## 2022-08-08 NOTE — Assessment & Plan Note (Signed)
Health maintenance was discussed.  She had a adenmatous polyp found in 2017 so she probably needs a repeat colonoscopy.  We will refer her out to GI as described.

## 2022-08-08 NOTE — Progress Notes (Signed)
Office Visit  Subjective   Patient ID: Priscella Donna   DOB: 22-Dec-1965   Age: 57 y.o.   MRN: [id number]   Chief Complaint Chief Complaint  Patient presents with   Annual Exam    Annual     History of Present Illness Javonne A. Tufaro is a 57 year old African American/Black female who presents for her annual health maintenance exam. This patient's past medical history Anxiety, Cerebrovascular Accident (CVA), Diabetes Mellitus, Type II, Diabetic Retinopathy, Essential thrombocythemia, Hyperlipidemia, and Hypertension, Benign Essential.   She had a dilated eye exam on 02/03/2022 which showed mild diabetic retinopathy and mild macular edema.  She has a history of retinal detachment in the past.  Her last colonoscopy was in 04/2016 which showed one polpy and she underwent polypectomy.  She also had an EGD in April of 2018, which showed mild gastritis, a small hiatal hernia, and a Schatzki ring.  She also had esophageal dilation.  She has a history of GERD but this is controlled on protonix. Her last digital screening mammogram was done on 06/08/2021 and this was normal.  She had an appointment to have this repeated but the mammogram machine broke and she was told they would call back for another appointment. She had a normal PAP smear in 2018 The patient does not exercise regularly.  She denies any problems with urination today.  She does not get yearly flu vaccines.  She has not had any of the shingles or COVID-19 vaccines.  She denies any depression today.   The patient is on ASA '325mg'$  daily and is supposed to be on plavix and states she has not gone for her refills.  The patient is a 57 year old African American/Black female who returns for a follow-up visit for her T2 diabetes.  She did see my NP in 06/2022 where she was reporting some hypoglycemia and reported drinking a lot of soda.  She was supposed to decrease her novolog to 25 Units daily but the patient states that she takes 28 Units  usually about twice a day with meals.  She remains Toujeo 80 Units daily, Trulicity 4.'5mg'$  subcut once a week, Novolog 28 Units subcut usually twice a day and metformin '1000mg'$  BID.  She is not walking as much as they would like.. She specifically denies shortness of breath, unexplained fatigue, palpitations or racing heartbeats, syncopal episodes, and unexplained abdominal pain, nausea or vomiting. She is checking her blood sugars and they range 80-150.  She came in fasting today in anticipation of lab work. Her last HgbA1c was done in 02/24/2022 and was 6.6%.  She has complications with diabetes with a history of diabetic retinopathy.  There are no complications of diabetic neuropathy, nephropathy or vascular disease.  My NP was going to set her up to see endocrinology but the patient states she never heard from them.  The patient is a 57 year old African American/Black female who returns for followup of her anxiety disorder.  The symptoms have been present for years and are described as mild in severity.  She states she has panic attacks maybe once per week.  She denies any depression.   She reports no additional symptoms She denies feelings of worthlessness, helpless feeling, suicidal ideation, homicidal ideation, weight gain, weight loss, and hypersomnia or insomnia. This patient feels that she is able to care for herself and her dependents. Predisposing factors include: a chronic medical illness and life style stress of multiple roles. She denies  a recent life crisis. She currently lives with her family.   The patient is a 57 year old African American/Black female who presents for a follow-up evaluation of hypertension.  Since her last visit, she has not had any problems.  The patient has been checking her blood pressure at home. The patient's blood pressure has ranged systollically from 681-275'T. SThe patient's current medications include: amlodpine '10mg'$  daily, enalapril maleate 20 mg BID, and  hydrochlorothiazide 12.5 mg daily.   The patient has been tolerating her medications well. The patient denies any visual changes, lightheadness, shortness of breath, orthopnea, and weakness/numbness. She reports there have been no other symptoms noted.   Valeska A. Espey returns today for routine followup on her cholesterol. Overall, she states she is doing well and is without any complaints or problems at this time. She specifically denies chest pain, abdominal pain, nausea, diarrhea, and myalgias. She remains on dietary management as well as the following cholesterol lowering medications pravastatin 40 mg oral tablet qhs. She is fasting in anticipation for labs today.   The patient has had had multiple strokes related to her thrombocytosis and she reports some residual sequalae of left sided weakness but she is able to use that side.  She has not been following with Neurology.  She does follow with Hematology with Dr. Hinton Rao where she last saw her in 06/2022.  She is currently treated with hydroxyruea.  The patient was diagnosed with thrombocytosis in 2005.  She has been noted to have persistent LUQ pain r/to to splenomegaly and she is being treated with Oxycodone and Oxycontin.  Pertinent workup for her splenomegaly has included colonoscopy which showed some polyps and she had a polypectomy. MRI done in in 06/2015 indicated a benign appearing cyst of the pancreas. MRI in 06/2016  showed a stable cystic lesion of the pancreas measuring 103m with no splenomegaly. MRI of abd in 09/2017 revealed a 118mcyst in the pancreatic uncinate process with no liver lesions noted. MRI in 04/2018 indicated a 1974mtable cystic lesion of the pancreas.  She is treated with Hydroxyurea.   Her last MRI was completed September 2021, and this unfortunately showed an increase in the pancreatic cyst from 2.5 cm to 3.8 cm in the last 2 years.  Dr. McCHinton Raonted this further evaluated with an EUS.  She is currently referring her to  gastroenterology in [place] get that done.   There is a family history of pancreatic cancer with a first-degree relative with her brother who had pancreatic cancer.  He declined genetic testing.  Genetic testing has been recommended for the patient, but not done due to lack of insurance coverage.     The patient also has a history of trigger finger of multiple fingers of her bilateral hands.  She is followed by Orthopedics and she states that they have discussed surgery for treatment. She uses brace and that has not been effective.  She has had multiple injections of her hands.          Past Medical History Past Medical History:  Diagnosis Date   Acid reflux    Anxiety    Carpal tunnel syndrome    Cerebrovascular disease    Diabetic retinopathy ([place]  Family history of pancreatic cancer 08/09/2021   Gait abnormality    History of stroke    Hyperlipidemia    Hypertension    Migraine    Pancreatic cyst 08/09/2021   Retinal detachment    Thrombocytosis  Trigger finger    Ulnar nerve entrapment      Allergies Allergies  Allergen Reactions   Iodinated Contrast Media Other (See Comments) and Shortness Of Breath     Review of Systems Review of Systems  Constitutional:  Negative for chills, fever, malaise/fatigue and weight loss.  HENT:  Negative for hearing loss and tinnitus.   Eyes:  Negative for blurred vision and double vision.  Respiratory:  Negative for cough, sputum production, shortness of breath and wheezing.   Cardiovascular:  Negative for chest pain, palpitations and leg swelling.  Gastrointestinal:  Positive for abdominal pain. Negative for blood in stool, constipation, diarrhea, heartburn, melena, nausea and vomiting.  Genitourinary:  Negative for frequency and hematuria.  Musculoskeletal:  Negative for back pain and myalgias.  Skin:  Negative for itching and rash.  Neurological:  Negative for dizziness, weakness and headaches.  Psychiatric/Behavioral:   Negative for depression. The patient is nervous/anxious.        Objective:    Vitals BP (!) 148/80   Pulse 81   Temp 98 F (36.7 C)   Resp 16   Ht '5\' 4"'$  (1.626 m)   Wt 149 lb 12.8 oz (67.9 kg)   SpO2 96%   BMI 25.71 kg/m    Physical Examination Physical Exam     Assessment & Plan:   Essential hypertension Her BP is a bit elevated today but this past year her BP has done well.  We will see what her BP is running on her next visit.  Cerebrovascular disease She had her last stroke in 11/2020 but she has had some TIA's.  Her strokes have been due to her essential thrombocytosis where she is on hydroxyurea.  We will continue with secondary prevention.  She needs to stay on her plavix and her ASA where she has been noncompliant with the plavix.  Pancreatic cyst I have reviewed Dr. Remi Deter notes and the patient states she has not heard back from GI.  I am going to refer her to see Dr. Lyndel Safe for evaluation for an endoscopic ultrasound (EUS).  She does have pain from her spleen and Dr. Hinton Rao follows this and writes from pain meds.  Gastroesophageal reflux disease without esophagitis Her reflux is controlled on protonix.  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  There has been a history of some noncompliance in the past.  We will check a HGBA1c today and urine studies.  Her diabetic foot exam was normal. I am going to refer her to endocrinology.  Trigger finger She will followup with ortho as needed for her fingers.  Essential thrombocytosis ([place]) I reviewed her notes from 06/2022.  We will recheck her CBC today and she is to continue on her hydroxyurea.  Iron deficiency anemia Dr. Hinton Rao wants iron studies on her which we will do today.  Hyperlipidemia I am going to make her LDL goal less than 70 with her history of strokes.  We will check her FLP today.  GAD  (generalized anxiety disorder) Her anxiety is controlled.  She is on xanax which she takes every day.  BMI 25.0-25.9,adult I want her to eat healthy, exercise and lose weight.  Annual physical exam Health maintenance was discussed.  She had a adenmatous polyp found in 2017 so she probably needs a repeat colonoscopy.  We will refer her out to GI as described.    Return in about  3 months (around 11/07/2022).   Townsend Roger, MD

## 2022-08-08 NOTE — Assessment & Plan Note (Addendum)
I have reviewed Dr. Remi Deter notes and the patient states she has not heard back from GI.  I am going to refer her to see Dr. Lyndel Safe for evaluation for an endoscopic ultrasound (EUS).  She does have pain from her spleen and Dr. Hinton Rao follows this and writes from pain meds.

## 2022-08-08 NOTE — Assessment & Plan Note (Signed)
She had her last stroke in 11/2020 but she has had some TIA's.  Her strokes have been due to her essential thrombocytosis where she is on hydroxyurea.  We will continue with secondary prevention.  She needs to stay on her plavix and her ASA where she has been noncompliant with the plavix.

## 2022-08-08 NOTE — Assessment & Plan Note (Signed)
I am going to make her LDL goal less than 70 with her history of strokes.  We will check her FLP today.

## 2022-08-08 NOTE — Assessment & Plan Note (Signed)
Her anxiety is controlled.  She is on xanax which she takes every day.

## 2022-08-08 NOTE — Assessment & Plan Note (Signed)
Her reflux is controlled on protonix.

## 2022-08-08 NOTE — Assessment & Plan Note (Signed)
Her BP is a bit elevated today but this past year her BP has done well.  We will see what her BP is running on her next visit.

## 2022-08-08 NOTE — Assessment & Plan Note (Signed)
Dr. Hinton Rao wants iron studies on her which we will do today.

## 2022-08-08 NOTE — Assessment & Plan Note (Signed)
I reviewed her notes from 06/2022.  We will recheck her CBC today and she is to continue on her hydroxyurea.

## 2022-08-08 NOTE — Assessment & Plan Note (Signed)
She will followup with ortho as needed for her fingers.

## 2022-08-08 NOTE — Assessment & Plan Note (Signed)
I want her to eat healthy, exercise and lose weight. 

## 2022-08-08 NOTE — Assessment & Plan Note (Signed)
She is not having hypoglycemia at this time.  There has been a history of some noncompliance in the past.  We will check a HGBA1c today and urine studies.  Her diabetic foot exam was normal. I am going to refer her to endocrinology.

## 2022-08-09 LAB — CMP14 + ANION GAP
ALT: 26 IU/L (ref 0–32)
AST: 21 IU/L (ref 0–40)
Albumin/Globulin Ratio: 1.8 (ref 1.2–2.2)
Albumin: 4.2 g/dL (ref 3.8–4.9)
Alkaline Phosphatase: 102 IU/L (ref 44–121)
Anion Gap: 19 mmol/L — ABNORMAL HIGH (ref 10.0–18.0)
BUN/Creatinine Ratio: 8 — ABNORMAL LOW (ref 9–23)
BUN: 7 mg/dL (ref 6–24)
Bilirubin Total: 0.6 mg/dL (ref 0.0–1.2)
CO2: 20 mmol/L (ref 20–29)
Calcium: 9.7 mg/dL (ref 8.7–10.2)
Chloride: 104 mmol/L (ref 96–106)
Creatinine, Ser: 0.86 mg/dL (ref 0.57–1.00)
Globulin, Total: 2.3 g/dL (ref 1.5–4.5)
Glucose: 262 mg/dL — ABNORMAL HIGH (ref 70–99)
Potassium: 4.3 mmol/L (ref 3.5–5.2)
Sodium: 143 mmol/L (ref 134–144)
Total Protein: 6.5 g/dL (ref 6.0–8.5)
eGFR: 79 mL/min/{1.73_m2} (ref >59)

## 2022-08-09 LAB — LIPID PANEL
Chol/HDL Ratio: 3.9 ratio (ref 0.0–4.4)
Cholesterol, Total: 149 mg/dL (ref 100–199)
HDL: 38 mg/dL — ABNORMAL LOW (ref >39)
LDL Chol Calc (NIH): 95 mg/dL (ref 0–99)
Triglycerides: 82 mg/dL (ref 0–149)
VLDL Cholesterol Cal: 16 mg/dL (ref 5–40)

## 2022-08-09 LAB — HEMOGLOBIN A1C
Est. average glucose Bld gHb Est-mCnc: 174 mg/dL
Hgb A1c MFr Bld: 7.7 % — ABNORMAL HIGH (ref 4.8–5.6)

## 2022-08-09 LAB — CBC WITH DIFFERENTIAL/PLATELET
Basophils Absolute: 0 10*3/uL (ref 0.0–0.2)
Basos: 0 %
EOS (ABSOLUTE): 0.1 10*3/uL (ref 0.0–0.4)
Eos: 3 %
Hematocrit: 30.9 % — ABNORMAL LOW (ref 34.0–46.6)
Hemoglobin: 10.4 g/dL — ABNORMAL LOW (ref 11.1–15.9)
Immature Grans (Abs): 0 10*3/uL (ref 0.0–0.1)
Immature Granulocytes: 0 %
Lymphocytes Absolute: 0.8 10*3/uL (ref 0.7–3.1)
Lymphs: 23 %
MCH: 30.4 pg (ref 26.6–33.0)
MCHC: 33.7 g/dL (ref 31.5–35.7)
MCV: 90 fL (ref 79–97)
Monocytes Absolute: 0.2 10*3/uL (ref 0.1–0.9)
Monocytes: 6 %
Neutrophils Absolute: 2.2 10*3/uL (ref 1.4–7.0)
Neutrophils: 68 %
Platelets: 265 10*3/uL (ref 150–450)
RBC: 3.42 x10E6/uL — ABNORMAL LOW (ref 3.77–5.28)
RDW: 15.2 % (ref 11.7–15.4)
WBC: 3.3 10*3/uL — ABNORMAL LOW (ref 3.4–10.8)

## 2022-08-09 LAB — MICROALBUMIN / CREATININE URINE RATIO
Creatinine, Urine: 140.7 mg/dL
Microalb/Creat Ratio: 24 mg/g creat (ref 0–29)
Microalbumin, Urine: 34.3 ug/mL

## 2022-08-09 LAB — IRON,TIBC AND FERRITIN PANEL
Ferritin: 134 ng/mL (ref 15–150)
Iron Saturation: 42 % (ref 15–55)
Iron: 110 ug/dL (ref 27–159)
Total Iron Binding Capacity: 259 ug/dL (ref 250–450)
UIBC: 149 ug/dL (ref 131–425)

## 2022-08-09 LAB — VITAMIN B12: Vitamin B-12: 307 pg/mL (ref 232–1245)

## 2022-08-09 LAB — TSH: TSH: 3.38 u[IU]/mL (ref 0.450–4.500)

## 2022-08-12 NOTE — Assessment & Plan Note (Addendum)
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 This has slowly increased since 2016.  MRI abdomen was recommended by Dr. Hinton Rao in September 2022, but as her last MRI in September 2021 was stable, follow up MRI in 2 years was recommended.  MRI abdomen in November revealed significant interval enlargement of a thinly septated cystic lesion of the central pancreatic head, now measuring 3.8 x 3.0 cm, previously 2.5 x 2.0 cm.  We referred her to Dr. Ardis Hughs for endoscopic ultrasound, but she has not seen him. We contacted his office and they told us he is unavailable and they will have Dr. Rush Landmark see her.

## 2022-08-12 NOTE — Assessment & Plan Note (Signed)
First-degree relative, brother, with pancreatic cancer.  He declined genetic testing.  Genetic testing has been recommended for the patient, but not done due to lack of insurance coverage.

## 2022-08-12 NOTE — Assessment & Plan Note (Addendum)
Mild chronic anemia which is likely secondary to hydroxyurea.  Her hemoglobin fluctuates up and down between usually 10 and 12. Iron studies, B12 and TSH were normal at Dr. Sharren Bridge office last week.

## 2022-08-12 NOTE — Progress Notes (Addendum)
Allison Stevens,  [place]  [id number] [phone]  Addendum: The patient states she is taking hydroxyurea 500 mg 4 times a day.  After discussion with Dr. Hinton Rao, she recommended decreasing hydroxyurea 500 mg to 3 times a day due to mild leukopenia.  Her anemia is stable.  Dr. Hinton Rao also recommended continuing to taper her opioids.  I decreased her immediate release oxycodone 10 mg to 5 mg every 6 hours as needed and continued extended release oxycodone 20 mg every 12 hours.  Clinic Day:  08/17/2022  Referring physician: Townsend Roger, MD  ASSESSMENT & PLAN:   Assessment & Plan: Essential thrombocytosis ([place]) Essential thrombocythemia, well controlled with hydroxyurea 500 mg 4 times a day, which she will continue. She is on chronic opioids due to bone pain. We will plan to see her back in 3 months with a CBC and comprehensive metabolic panel for repeat clinical assessment.  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 This has slowly increased since 2016.  MRI abdomen was recommended by Dr. Hinton Rao in September 2022, but as her last MRI in September 2021 was stable, follow up MRI in 2 years was recommended.  MRI abdomen in November revealed significant interval enlargement of a thinly septated cystic lesion of the central pancreatic head, now measuring 3.8 x 3.0 cm, previously 2.5 x 2.0 cm.  We referred her to Dr. Ardis Hughs for endoscopic ultrasound, but she has not seen him. We contacted his office and they told us he is unavailable and they will have Dr. Rush Landmark see her.  Anemia Mild chronic anemia which is likely secondary to hydroxyurea.  Her hemoglobin fluctuates up and down between usually 10 and 12. Iron studies, B12 and TSH were normal at Dr. Sharren Bridge office last week.  Family history of pancreatic cancer First-degree relative, brother, with pancreatic cancer.  He declined genetic testing.   Genetic testing has been recommended for the patient, but not done due to lack of insurance coverage.  Left-sided weakness The patient has a history of multiple strokes.  She has had some chronic mild left-sided weakness, but this is much worse and her balance is not normal.  I recommended transport to the emergency room immediately for further evaluation.  Unfortunately, she declined transfer to the emergency room despite my insistence.  Her husband could not convince her either.  She understands the importance of early diagnosis and treatment if she is having another stroke, as well as the potential complication of complete paralysis of her left side and signed out AMA.   The patient understands the plans discussed today and is in agreement with them.  She knows to contact our office if she develops concerns prior to her next appointment.   I provided 20 minutes of face-to-face time during this encounter and > 50% was spent counseling as documented under my assessment and plan.    [name], PA-C  [facility] AT [facility] [address] [place] [address] Dept: [phone] Dept Fax: [phone]   Orders Placed This Encounter  Procedures   CBC with Differential ([place] Only)    Standing Status:   Future    Standing Expiration Date:   08/18/2023   CMP ([place] only)    Standing Status:   Future    Standing Expiration Date:   08/18/2023      CHIEF COMPLAINT:  CC: Essential thrombocythemia  Current Treatment: Hydroxyurea 500 mg 3 times daily  HISTORY OF PRESENT ILLNESS:  Allison Stevens is a 57 y.o. female with a history of essential thrombocythemia originally diagnosed in January 2005.  This has been treated with hydroxyurea, but the patient has often been non-compliant.  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  She has persistent pain in the left upper quadrant, attributed  to splenomegaly, as well as bone pain, for which she remains on OxyContin with oxycodone for breakthrough.  We have tapered her opioids down over time. She saw Dr. Lacinda Axon, her gastroenterologist at [facility], and underwent an esophageal dilatation in August 2016.  She had already had a colonoscopy due to previous gastrointestinal complaints.  We have been following a lesion in the pancreas seen incidentally on CT done in the emergency room for abdominal pain.  This has been felt to be a benign cyst.  Repeat MRI abdomen in November 2016, revealed a slight increase in the benign appearing cyst of the pancreas.  No splenomegaly was seen.  Repeat MRI abdomen in 1 year was recommended.  She was hospitalized in September 2017 and underwent colonoscopy with removal of a small polyp in the sigmoid colon.  Small internal hemorrhoids were also seen.  Dr. Lyndel Safe also obtained a CEA and CA 19-9, and. the CA 19-9 was elevated at 145, the CEA was normal. Repeat MRI abdomen with contrast in September 2017 revealed a stable 14 mm cystic lesion in the pancreas.  Again, no splenomegaly was seen.  Follow-up MRI in 1 year was recommended.  Repeat MRI abdomen in March 2019 revealed a 17 mm cystic lesion in the pancreatic uncinate process, which was felt to be stable.  No significant liver lesions were seen.  Repeat MRI abdomen in 1 year was recommended.  Repeat MRI abdomen in September 2019 revealed a stable 19 mm cystic lesion of the pancreas, which appeared to be benign and still felt to be stable.  Repeat MRI abdomen with and without contrast in 1 year was recommended.  MRI abdomen in October 2020 revealed a stable 17 mm cystic lesion of the pancreas.  Continued yearly follow up MRI abdomen was recommended.    Screening mammogram in February 2016 revealed a possible mass in the right breast.  Right diagnostic mammogram and ultrasound was done and revealed 2 well circumscribed hypoechoic lesions in the right breast at 11 o 'clock, felt  to represent cysts.  Repeat mammogram and ultrasound in August revealed heterogeneous fibroglandular tissue felt to be benign, with resolution of the previously seen cysts and no masses.  Repeat diagnostic bilateral mammogram and ultrasound in 6 months was once again recommended.  Bilateral diagnostic mammogram in March 2017 did not reveal any evidence of malignancy, so 1 year follow-up screening mammogram was recommended.  Bilateral screening mammogram in March 2018 did not reveal any evidence of malignancy.  Bone density scan in June 2018 revealed osteopenia with a T-score of -1.5 in the femur.  The patient was instructed to take calcium and vitamin-D twice daily.  The patient's family history is significant in that her father had pancreatic cancer at age 87, a paternal uncle had pancreatic cancer and a paternal aunt had pancreatic cancer.  Her brother also had pancreatic cancer at age 64.  She is appropriate for genetic testing, but this has not been done yet due to insurance coverage.  Her last colonoscopy was in September 2017 and she did have a polypectomy of a  tubular adenoma of the sigmoid colon at that time.  She also had an EGD in April 2018, which showed mild gastritis, a small hiatal hernia, and a Schatzki ring, so she had esophageal dilation. Bilateral screening mammogram in December 2020 did not reveal any evidence of malignancy.    Repeat MRI abdomen in September 2021 revealed a 1.9 cm cystic lesion in the pancreatic uncinate process, which showed no significant change compared to more recent exams, but has shown a slow increase in size since 2016. 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 Follow-up by MRI in 2 years was recommended. She reports having another stroke in May 2022.  She then had COVID later in May and was hospitalized due to this.  She had been on hydroxyurea 4 times a day, but this was reduced in September 2022  to 3 times a day as her platelets were controlled and she was more anemic with a hemoglobin of 10. Repeat CBC in November revealed her hemoglobin to be back up to 11.6.   Bilateral screening mammogram in November 2022 did not reveal any evidence of malignancy.  At her visit in January 2023, she was taking hydroxyurea 500 mg 3 times daily.  Her platelets remains normal, but her hemoglobin had dropped again to 10.4.  When she was seen in April, her platelets remained normal, but her hemoglobin had dropped to 9.8.  In July, her platelets remained normal and her hemoglobin was up to 10.5.  She had a repeat MRI abdomen in November unfortunately this revealed a significant interval enlargement of a septated pseudocystic lesion in the central pancreatic head now measuring 3.8 x 3 cm, previously 2.5 x 2 cm.  EUS/FNA was recommended.  We referred her to Dr. Ardis Hughs in [place].  INTERVAL HISTORY:  Allison Stevens is here today for repeat clinical assessment.  She states she continues hydroxyurea 4 times daily.  She states she noted increased general weakness and more difficulty walking this morning.  She has had some residual left-sided weakness, but it seems worse today.  She states she has had several mini strokes over the last 6 months.  She states she had some nausea without vomiting this morning.  She denies fevers or chills. She reports chronic abdominal pain. Her appetite is good. Her weight has increased 1 pounds over last 2 months .  She had labs drawn last week at Dr. Sharren Bridge office which revealed a stable hemoglobin of 10.4 and platelets 265,000.  White blood count was 3.3 with an ANC of 2.2.  REVIEW OF SYSTEMS:  Review of Systems  Constitutional:  Positive for fatigue. Negative for appetite change, chills, fever and unexpected weight change.  HENT:   Negative for lump/mass, mouth sores and sore throat.   Respiratory:  Negative for cough and shortness of breath.   Cardiovascular:  Negative for chest pain and  leg swelling.  Gastrointestinal:  Positive for nausea. Negative for abdominal pain, constipation, diarrhea and vomiting.  Endocrine: Negative for hot flashes.  Genitourinary:  Negative for difficulty urinating, dysuria, frequency and hematuria.   Musculoskeletal:  Positive for gait problem. Negative for arthralgias, back pain and myalgias.  Skin:  Negative for rash.  Neurological:  Positive for extremity weakness and gait problem. Negative for dizziness and headaches.  Hematological:  Negative for adenopathy. Does not bruise/bleed easily.  Psychiatric/Behavioral:  Negative for depression and sleep disturbance. The patient is not nervous/anxious.      VITALS:  Blood  pressure (!) 181/88, pulse 80, temperature 97.6 F (36.4 C), temperature source Oral, resp. rate 20, height 5' 4"$  (1.626 m), weight 151 lb 8 oz (68.7 kg), SpO2 100 %.  Wt Readings from Last 3 Encounters:  08/17/22 151 lb 8 oz (68.7 kg)  08/08/22 149 lb 12.8 oz (67.9 kg)  06/13/22 150 lb 3.2 oz (68.1 kg)    Body mass index is 26 kg/m.  Performance status (ECOG): 3 - Symptomatic, >50% confined to bed  PHYSICAL EXAM:  Physical Exam Vitals and nursing note reviewed.  Constitutional:      General: She is not in acute distress.    Appearance: She is not ill-appearing.  HENT:     Head: Normocephalic and atraumatic.     Mouth/Throat:     Mouth: Mucous membranes are moist.     Pharynx: Oropharynx is clear. No oropharyngeal exudate or posterior oropharyngeal erythema.  Eyes:     General: No scleral icterus.    Extraocular Movements: Extraocular movements intact.     Conjunctiva/sclera: Conjunctivae normal.     Pupils: Pupils are equal, round, and reactive to light.  Cardiovascular:     Rate and Rhythm: Normal rate and regular rhythm.     Heart sounds: Normal heart sounds. No murmur heard.    No friction rub. No gallop.  Pulmonary:     Effort: Pulmonary effort is normal.     Breath sounds: Normal breath sounds. No  wheezing, rhonchi or rales.  Abdominal:     General: There is no distension.     Palpations: Abdomen is soft. There is no hepatomegaly, splenomegaly or mass.     Tenderness: There is no abdominal tenderness.  Musculoskeletal:        General: Normal range of motion.     Cervical back: Normal range of motion and neck supple. No tenderness.     Right lower leg: No edema.     Left lower leg: No edema.  Lymphadenopathy:     Cervical: No cervical adenopathy.     Upper Body:     Right upper body: No supraclavicular or axillary adenopathy.     Left upper body: No supraclavicular or axillary adenopathy.  Skin:    General: Skin is warm and dry.     Coloration: Skin is not jaundiced.     Findings: No rash.  Neurological:     Mental Status: She is alert and oriented to person, place, and time.     Cranial Nerves: Dysarthria and facial asymmetry present.     Motor: Weakness present.     Coordination: Romberg sign positive.     Gait: Gait abnormal.     Comments: Her speech is slow.  There is mild left-sided facial weakness.  Motor strength is 4 out of 5 in the left upper and lower extremities and 5 out of 5 in the right upper and lower extremities.  She is very unsteady on her feet and shuffling.  Psychiatric:        Mood and Affect: Affect is flat.        Speech: Speech is delayed.        Behavior: Behavior normal.        Thought Content: Thought content normal.        Cognition and Memory: Cognition is not impaired.     LABS:      Latest Ref Rng & Units 08/08/2022   11:09 AM 05/30/2022   12:00 AM 02/08/2022   12:00 AM  CBC  WBC 3.4 - 10.8 x10E3/uL 3.3  4.0     4.0      Hemoglobin 11.1 - 15.9 g/dL 10.4  10.3     10.5      Hematocrit 34.0 - 46.6 % 30.9  30     31      $ Platelets 150 - 450 x10E3/uL 265  290     291         This result is from an external source.      Latest Ref Rng & Units 08/08/2022   11:09 AM 05/30/2022   12:00 AM 02/08/2022   12:00 AM  CMP  Glucose 70 - 99 mg/dL  262     BUN 6 - 24 mg/dL 7  9     8      $ Creatinine 0.57 - 1.00 mg/dL 0.86  0.7     0.7      Sodium 134 - 144 mmol/L 143  141     141      Potassium 3.5 - 5.2 mmol/L 4.3  3.9     3.4      Chloride 96 - 106 mmol/L 104  107     107      CO2 20 - 29 mmol/L 20  29     28      $ Calcium 8.7 - 10.2 mg/dL 9.7  9.2     9.1      Total Protein 6.0 - 8.5 g/dL 6.5     Total Bilirubin 0.0 - 1.2 mg/dL 0.6     Alkaline Phos 44 - 121 IU/L 102  73     69      AST 0 - 40 IU/L 21  33     31      ALT 0 - 32 IU/L 26  33     29         This result is from an external source.     No results found for: "CEA1", "CEA" / No results found for: "CEA1", "CEA" No results found for: "PSA1" No results found for: "[id number]" No results found for: "CAN125"  No results found for: "TOTALPROTELP", "ALBUMINELP", "A1GS", "A2GS", "BETS", "BETA2SER", "GAMS", "MSPIKE", "SPEI" Lab Results  Component Value Date   TIBC 259 08/08/2022   TIBC 301 11/09/2021   FERRITIN 134 08/08/2022   FERRITIN 59 11/09/2021   IRONPCTSAT 42 08/08/2022   IRONPCTSAT 26 11/09/2021   No results found for: "LDH"  STUDIES:  No results found.    HISTORY:   Past Medical History:  Diagnosis Date   Acid reflux    Anxiety    Carpal tunnel syndrome    Cerebrovascular disease    Diabetic retinopathy ([place])    Family history of pancreatic cancer 08/09/2021   Gait abnormality    History of stroke    Hyperlipidemia    Hypertension    Migraine    Pancreatic cyst 08/09/2021   Retinal detachment    Thrombocytosis    Trigger finger    Ulnar nerve entrapment     Past Surgical History:  Procedure Laterality Date   CESAREAN SECTION  1997   GALLBLADDER SURGERY  2011    Family History  Problem Relation Age of Onset   Diabetes Mother    Cancer Mother    Cancer Father    Healthy Child     Social History:  reports that she has never smoked. She has never used smokeless tobacco. She reports that she does not  drink alcohol and does not use  drugs.The patient is accompanied by her husband today.  Allergies:  Allergies  Allergen Reactions   Iodinated Contrast Media Other (See Comments) and Shortness Of Breath    Current Medications: Current Outpatient Medications  Medication Sig Dispense Refill   ALPRAZolam (XANAX) 0.5 MG tablet Take 1 tablet (0.5 mg total) by mouth every 8 (eight) hours as needed for anxiety. 90 tablet 2   amLODipine (NORVASC) 10 MG tablet Take 10 mg by mouth daily.     aspirin 81 MG chewable tablet Chew 1 tablet by mouth daily.     carvedilol (COREG) 12.5 MG tablet Take 12.5 mg by mouth 2 (two) times daily.     clopidogrel (PLAVIX) 75 MG tablet Take 1 tablet (75 mg total) by mouth daily. 90 tablet 3   diclofenac Sodium (VOLTAREN) 1 % GEL Apply 4 g topically 4 (four) times daily.     enalapril (VASOTEC) 20 MG tablet Take 20 mg by mouth 2 (two) times daily.     glimepiride (AMARYL) 2 MG tablet Take by mouth.     hydrochlorothiazide (HYDRODIURIL) 12.5 MG tablet Take 12.5 mg by mouth daily.     hydroxyurea (HYDREA) 500 MG capsule TAKE 1 CAPSULE BY MOUTH THREE TIMES A DAY 270 capsule 1   ibuprofen (ADVIL,MOTRIN) 800 MG tablet Take 800 mg by mouth every 8 (eight) hours as needed.     Insulin Detemir (LEVEMIR) 100 UNIT/ML Pen Inject into the skin.     lidocaine (XYLOCAINE) 1 % (with preservative) injection by Infiltration route.     metFORMIN (GLUCOPHAGE) 1000 MG tablet Take 1,000 mg by mouth 2 (two) times daily with a meal.     metoCLOPramide (REGLAN) 5 MG tablet Take 5 mg by mouth every 8 (eight) hours.     montelukast (SINGULAIR) 10 MG tablet Take 10 mg by mouth at bedtime.     niacin 500 MG tablet Take 500 mg by mouth at bedtime.     NOVOLOG FLEXPEN 100 UNIT/ML FlexPen SMARTSIG:34 Unit(s) SUB-Q 3 Times Daily     oxyCODONE (OXY IR/ROXICODONE) 5 MG immediate release tablet Take 1 tablet (5 mg total) by mouth every 6 (six) hours as needed for severe pain. 120 tablet 0   oxyCODONE (OXYCONTIN) 20 mg 12 hr tablet  Take 1 tablet (20 mg total) by mouth every 12 (twelve) hours. 60 tablet 0   pantoprazole (PROTONIX) 40 MG tablet Take 1 tablet (40 mg total) by mouth daily. 30 tablet 6   Potassium Chloride ER 20 MEQ TBCR Take 1 tablet by mouth daily.     pravastatin (PRAVACHOL) 40 MG tablet Take 40 mg by mouth daily.     prochlorperazine (COMPAZINE) 10 MG tablet Take 10 mg by mouth every 6 (six) hours as needed for nausea or vomiting.     spironolactone (ALDACTONE) 25 MG tablet Take 0.5 tablets by mouth daily.     TOUJEO MAX SOLOSTAR 300 UNIT/ML Solostar Pen SMARTSIG:80 Unit(s) SUB-Q Every Night     triamcinolone acetonide (TRIESENCE) 40 MG/ML SUSP Inject into the articular space.     TRULICITY 4.5 0000000 SOPN SMARTSIG:1 Pre-Filled Pen Syringe SUB-Q Once a Week     No current facility-administered medications for this visit.

## 2022-08-12 NOTE — Assessment & Plan Note (Addendum)
Essential thrombocythemia, well controlled with hydroxyurea 500 mg 4 times a day, which she will continue. She is on chronic opioids due to bone pain. We will plan to see her back in 3 months with a CBC and comprehensive metabolic panel for repeat clinical assessment.

## 2022-08-15 ENCOUNTER — Telehealth: Payer: Self-pay | Admitting: Gastroenterology

## 2022-08-15 ENCOUNTER — Telehealth: Payer: Self-pay

## 2022-08-15 ENCOUNTER — Other Ambulatory Visit: Payer: Medicare Other

## 2022-08-15 ENCOUNTER — Ambulatory Visit: Payer: Medicare Other | Admitting: Oncology

## 2022-08-15 NOTE — Telephone Encounter (Signed)
Spoke with them at Dr Ardis Hughs office he has been on leave they will forward referral to Dr Rush Landmark today in their office and they will reach out to patient to schedule appt.

## 2022-08-15 NOTE — Telephone Encounter (Signed)
-----  Message from [name], PA-C sent at 08/12/2022  4:48 PM EST ----- She is scheduled for f/u on 1/17. We sent a referral to Dr. Ardis Hughs for endoscopic u/s in November. I do not see that this was scheduled. Will you f/u on it? She has a slowly growing pancreatic cyst. Thanks

## 2022-08-15 NOTE — Telephone Encounter (Signed)
This patient has been referred here because she needs an EUS due to having a pancreatic cyst/mass and a family history of pancreatic cancer.  Please reach out to patient.  Thank you.

## 2022-08-16 NOTE — Telephone Encounter (Signed)
Left message on machine to call back  

## 2022-08-16 NOTE — Telephone Encounter (Signed)
No answer no voicemail will attempt later

## 2022-08-16 NOTE — Telephone Encounter (Signed)
I have reviewed the patient's chart. I have reviewed the patient's imaging through [facility]. Most recent MRI shows the following from the fall 2023: IMPRESSION: 1. Significant interval enlargement of a thinly septated cystic lesion of the central pancreatic head, now measuring 3.8 x 3.0 cm, previously 2.5 x 2.0 cm. This appears to communicate with the adjacent main pancreatic duct, and as previously reported, this has been observed to grow very slowly over a long period of time on examinations dating back to at least 2015. 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 Given substantial interval growth and size and greater than 2.5 cm, recommend EUS/FNA for definitive diagnosis. This recommendation follows ACR white paper guidelines for the management of incidental pancreatic cystic lesions. 2. Status post cholecystectomy.  Based on the size of this lesion increasing over the course of the last 8 years, it is reasonable to consider an EUS with FNA to ensure nothing else is being missed and that this is truly just an IPMN. Thus the following are the recommendations: 1) proceed with scheduling EUS with possible FNA at patient convenience in the next few months 2) if patient wants to be seen in clinic first before undergoing procedure she can be scheduled with me (okay to use overbook if needed) 3) once decision has been made, forward the referring provider this notation  Thanks. GM

## 2022-08-17 ENCOUNTER — Encounter: Payer: Self-pay | Admitting: Hematology and Oncology

## 2022-08-17 ENCOUNTER — Inpatient Hospital Stay: Payer: Medicare Other | Attending: Hematology and Oncology | Admitting: Hematology and Oncology

## 2022-08-17 ENCOUNTER — Other Ambulatory Visit: Payer: Medicare Other

## 2022-08-17 VITALS — BP 181/88 | HR 80 | Temp 97.6°F | Resp 20 | Ht 64.0 in | Wt 151.5 lb

## 2022-08-17 DIAGNOSIS — Z800 Family history of malignant neoplasm of digestive organs: Secondary | ICD-10-CM

## 2022-08-17 DIAGNOSIS — K862 Cyst of pancreas: Secondary | ICD-10-CM

## 2022-08-17 DIAGNOSIS — D473 Essential (hemorrhagic) thrombocythemia: Secondary | ICD-10-CM | POA: Diagnosis not present

## 2022-08-17 DIAGNOSIS — R531 Weakness: Secondary | ICD-10-CM

## 2022-08-17 DIAGNOSIS — D6489 Other specified anemias: Secondary | ICD-10-CM | POA: Diagnosis not present

## 2022-08-17 NOTE — Telephone Encounter (Signed)
Attempted to reach pt again- no answer on home number and Left message on machine to call back on cell number

## 2022-08-17 NOTE — Assessment & Plan Note (Addendum)
The patient has a history of multiple strokes.  She has had some chronic mild left-sided weakness, but this is much worse and her balance is not normal.  I recommended transport to the emergency room immediately for further evaluation.  Unfortunately, she declined transfer to the emergency room despite my insistence.  Her husband could not convince her either.  She understands the importance of early diagnosis and treatment if she is having another stroke, as well as the potential complication of complete paralysis of her left side and signed out AMA.

## 2022-08-17 NOTE — Progress Notes (Signed)
Patient in for appointment left sided weakness noted , unsteady gait, when asked to smile left side not as great as right. Provider called in room I stayed with patient. Husband asked to come in the room . Importance of situation explained by provider, patient refused to go to the emergency room. Hx of TIA's. Patient and husband voiced understanding and patient still refused to be transported to the ED. AMA paperwork signed.

## 2022-08-18 NOTE — Telephone Encounter (Signed)
Attempted to reach the pt again no answer no voice mail

## 2022-08-19 ENCOUNTER — Telehealth: Payer: Self-pay

## 2022-08-19 NOTE — Telephone Encounter (Signed)
No answer no voice mail  

## 2022-08-19 NOTE — Telephone Encounter (Signed)
I was able to finally speak with the pt and she tells me that she is not interested in any appts or procedures at this time. She recently had a stroke and is not able to do anything now.

## 2022-08-19 NOTE — Telephone Encounter (Signed)
Spoke with patient over the phone for follow-up of symptoms at previous appointment. Patient reported she has has left sided weakness . She has not made appointment with PCP or any other medical outreach ED ect. Importance of delayed care explained patient voiced understanding, importance of care to prevent further complications explained patient also voiced understanding.

## 2022-08-19 NOTE — Telephone Encounter (Signed)
I am sorry to hear this. Certainly the patient has ability to make a decision for herself as to what she would want or not want done. I am putting Dr. Lyndel Safe on here, it looks like this may be a patient Dr. Lyndel Safe has known in the past.  If not I apologize. Patty, please let the referring provider know the patient's desires of not pursuing any further workup of this pancreatic cyst. Thanks. GM

## 2022-08-22 NOTE — Telephone Encounter (Signed)
The referring has been notified

## 2022-08-25 ENCOUNTER — Other Ambulatory Visit: Payer: Self-pay

## 2022-08-25 DIAGNOSIS — R161 Splenomegaly, not elsewhere classified: Secondary | ICD-10-CM

## 2022-08-25 DIAGNOSIS — M898X9 Other specified disorders of bone, unspecified site: Secondary | ICD-10-CM

## 2022-08-26 ENCOUNTER — Other Ambulatory Visit: Payer: Self-pay | Admitting: Hematology and Oncology

## 2022-08-26 ENCOUNTER — Other Ambulatory Visit: Payer: Self-pay

## 2022-08-26 ENCOUNTER — Telehealth: Payer: Self-pay

## 2022-08-26 DIAGNOSIS — M898X9 Other specified disorders of bone, unspecified site: Secondary | ICD-10-CM

## 2022-08-26 DIAGNOSIS — R161 Splenomegaly, not elsewhere classified: Secondary | ICD-10-CM

## 2022-08-26 MED ORDER — OXYCODONE HCL ER 20 MG PO T12A: 20.0000 mg | EXTENDED_RELEASE_TABLET | Freq: Two times a day (BID) | ORAL | 0 refills | Status: DC

## 2022-08-26 MED ORDER — OXYCODONE HCL 5 MG PO TABS: 5.0000 mg | ORAL_TABLET | Freq: Four times a day (QID) | ORAL | 0 refills | Status: DC | PRN

## 2022-08-26 NOTE — Telephone Encounter (Signed)
-----  Message from [name], PA-C sent at 08/26/2022  1:14 PM EST ----- Regarding: RE: pain medication Spoke with patient, plan to continue to taper opiods. Decreased oxycodone IR to 5 mg q6hr prn ----- Message ----- From: Georgette Shell, RN Sent: 08/26/2022  12:54 PM EST To: [name], PA-C Subject: pain medication                                Called requesting refill on pain medication  Oxycodone '10mg'$  Oxycontin '20mg'$  q12hrs.  Send to [facility].  Thanks

## 2022-08-30 ENCOUNTER — Other Ambulatory Visit: Payer: Self-pay

## 2022-08-30 MED ORDER — ATORVASTATIN CALCIUM 40 MG PO TABS: 40.0000 mg | ORAL_TABLET | Freq: Every day | ORAL | 1 refills | Status: DC

## 2022-08-30 NOTE — Progress Notes (Signed)
Per: Joline Salt   She has been referred to all Endocrinologist in her network and has no showed. They will not accept her referral.

## 2022-08-30 NOTE — Progress Notes (Signed)
She has been referred to all Endocrinologist in her network and has no showed. They will not accept her referral.

## 2022-08-30 NOTE — Progress Notes (Signed)
Her cholesterol is not controlled. Change her pravastatin to lipitor '40mg'$  qhs. Refer her to endo for her diabetes. Her A1c is not controlled.  Pt notified. Msg forwarded to referral coordinator.

## 2022-09-06 ENCOUNTER — Other Ambulatory Visit: Payer: Self-pay

## 2022-09-06 MED ORDER — NOVOLOG FLEXPEN 100 UNIT/ML ~~LOC~~ SOPN: PEN_INJECTOR | SUBCUTANEOUS | 3 refills | Status: DC

## 2022-09-06 MED ORDER — TOUJEO MAX SOLOSTAR 300 UNIT/ML ~~LOC~~ SOPN: PEN_INJECTOR | SUBCUTANEOUS | 1 refills | Status: DC

## 2022-09-08 ENCOUNTER — Other Ambulatory Visit: Payer: Self-pay

## 2022-09-08 ENCOUNTER — Other Ambulatory Visit: Payer: Self-pay | Admitting: Internal Medicine

## 2022-09-09 ENCOUNTER — Telehealth: Payer: Self-pay

## 2022-09-09 NOTE — Telephone Encounter (Signed)
Spoke with patient this morning, she never followed up with Dr Rush Landmark for endoscopic U/S. Several documented calls from there office to patient. On 08/19/22 doc patient not interested in sch appt. Patient contacted today has not gone anywhere and not interested in appt provider made aware.

## 2022-09-14 ENCOUNTER — Telehealth: Payer: Self-pay

## 2022-09-14 NOTE — Telephone Encounter (Signed)
Pt called to req an appt or call regarding her pain, Her spleen and abdominal pain is "more than ever. I've dealt with it for 1.5 weeks and I can't take it anymore". Pt describes the pain as recurrent intermittent throbbing pain in abdomen and lower back. Pain medication isnt taking the pain away.

## 2022-09-17 ENCOUNTER — Other Ambulatory Visit: Payer: Self-pay

## 2022-09-17 DIAGNOSIS — M898X9 Other specified disorders of bone, unspecified site: Secondary | ICD-10-CM

## 2022-09-17 DIAGNOSIS — R161 Splenomegaly, not elsewhere classified: Secondary | ICD-10-CM

## 2022-09-17 DIAGNOSIS — Z800 Family history of malignant neoplasm of digestive organs: Secondary | ICD-10-CM

## 2022-09-17 NOTE — Telephone Encounter (Signed)
Referral sent electronically to [employer], [facility]

## 2022-09-26 ENCOUNTER — Other Ambulatory Visit: Payer: Self-pay

## 2022-09-26 DIAGNOSIS — M898X9 Other specified disorders of bone, unspecified site: Secondary | ICD-10-CM

## 2022-09-26 DIAGNOSIS — R161 Splenomegaly, not elsewhere classified: Secondary | ICD-10-CM

## 2022-09-26 MED ORDER — OXYCODONE HCL 5 MG PO TABS: 5.0000 mg | ORAL_TABLET | Freq: Four times a day (QID) | ORAL | 0 refills | Status: DC | PRN

## 2022-09-26 MED ORDER — OXYCODONE HCL ER 20 MG PO T12A: 20.0000 mg | EXTENDED_RELEASE_TABLET | Freq: Two times a day (BID) | ORAL | 0 refills | Status: DC

## 2022-09-27 ENCOUNTER — Telehealth: Payer: Self-pay

## 2022-09-27 NOTE — Telephone Encounter (Signed)
I spoke to Rosanne Sack, [place].  Kelli told me that Sammuel Cooper LPN had sent in an authorization request this morning for the oxycontin.    I called Jonice back and told her we didn't want to change anything until we hear back from insurance on if they will approve oxycontin.  She was okay with waiting until tomorrow to see if approved.

## 2022-09-29 ENCOUNTER — Other Ambulatory Visit: Payer: Self-pay | Admitting: Oncology

## 2022-09-29 DIAGNOSIS — D473 Essential (hemorrhagic) thrombocythemia: Secondary | ICD-10-CM

## 2022-09-29 MED ORDER — MORPHINE SULFATE ER 30 MG PO TBCR: 30.0000 mg | EXTENDED_RELEASE_TABLET | Freq: Two times a day (BID) | ORAL | 0 refills | Status: DC

## 2022-10-04 ENCOUNTER — Other Ambulatory Visit: Payer: Self-pay | Admitting: Oncology

## 2022-10-04 DIAGNOSIS — D473 Essential (hemorrhagic) thrombocythemia: Secondary | ICD-10-CM

## 2022-10-14 ENCOUNTER — Other Ambulatory Visit: Payer: Self-pay | Admitting: Internal Medicine

## 2022-10-14 MED ORDER — ALPRAZOLAM 0.5 MG PO TABS: 0.5000 mg | ORAL_TABLET | Freq: Three times a day (TID) | ORAL | 2 refills | Status: DC | PRN

## 2022-10-17 NOTE — Telephone Encounter (Signed)
Already sent in

## 2022-10-18 ENCOUNTER — Inpatient Hospital Stay: Payer: Medicare Other | Attending: Hematology and Oncology | Admitting: Oncology

## 2022-10-18 ENCOUNTER — Telehealth: Payer: Self-pay

## 2022-10-18 ENCOUNTER — Inpatient Hospital Stay: Payer: Medicare Other

## 2022-10-18 ENCOUNTER — Encounter: Payer: Self-pay | Admitting: Oncology

## 2022-10-18 ENCOUNTER — Other Ambulatory Visit: Payer: Self-pay | Admitting: Oncology

## 2022-10-18 VITALS — BP 199/83 | HR 78 | Temp 98.7°F | Resp 20 | Ht 64.0 in | Wt 154.6 lb

## 2022-10-18 DIAGNOSIS — R978 Other abnormal tumor markers: Secondary | ICD-10-CM

## 2022-10-18 DIAGNOSIS — K862 Cyst of pancreas: Secondary | ICD-10-CM

## 2022-10-18 DIAGNOSIS — K200 Eosinophilic esophagitis: Secondary | ICD-10-CM | POA: Diagnosis not present

## 2022-10-18 DIAGNOSIS — K219 Gastro-esophageal reflux disease without esophagitis: Secondary | ICD-10-CM | POA: Diagnosis not present

## 2022-10-18 DIAGNOSIS — D473 Essential (hemorrhagic) thrombocythemia: Secondary | ICD-10-CM | POA: Insufficient documentation

## 2022-10-18 DIAGNOSIS — Z800 Family history of malignant neoplasm of digestive organs: Secondary | ICD-10-CM | POA: Diagnosis not present

## 2022-10-18 DIAGNOSIS — D649 Anemia, unspecified: Secondary | ICD-10-CM | POA: Insufficient documentation

## 2022-10-18 DIAGNOSIS — K3184 Gastroparesis: Secondary | ICD-10-CM | POA: Diagnosis not present

## 2022-10-18 DIAGNOSIS — C7B8 Other secondary neuroendocrine tumors: Secondary | ICD-10-CM

## 2022-10-18 LAB — CBC WITH DIFFERENTIAL (CANCER CENTER ONLY)
Abs Immature Granulocytes: 0.03 10*3/uL (ref 0.00–0.07)
Basophils Absolute: 0 10*3/uL (ref 0.0–0.1)
Basophils Relative: 0 %
Eosinophils Absolute: 0.1 10*3/uL (ref 0.0–0.5)
Eosinophils Relative: 3 %
HCT: 32.2 % — ABNORMAL LOW (ref 36.0–46.0)
Hemoglobin: 10.6 g/dL — ABNORMAL LOW (ref 12.0–15.0)
Immature Granulocytes: 1 %
Lymphocytes Relative: 23 %
Lymphs Abs: 0.9 10*3/uL (ref 0.7–4.0)
MCH: 31.7 pg (ref 26.0–34.0)
MCHC: 32.9 g/dL (ref 30.0–36.0)
MCV: 96.4 fL (ref 80.0–100.0)
Monocytes Absolute: 0.2 10*3/uL (ref 0.1–1.0)
Monocytes Relative: 6 %
Neutro Abs: 2.5 10*3/uL (ref 1.7–7.7)
Neutrophils Relative %: 67 %
Platelet Count: 203 10*3/uL (ref 150–400)
RBC: 3.34 MIL/uL — ABNORMAL LOW (ref 3.87–5.11)
RDW: 17.9 % — ABNORMAL HIGH (ref 11.5–15.5)
WBC Count: 3.8 10*3/uL — ABNORMAL LOW (ref 4.0–10.5)
nRBC: 0 % (ref 0.0–0.2)

## 2022-10-18 LAB — CMP (CANCER CENTER ONLY)
ALT: 22 U/L (ref 0–44)
AST: 21 U/L (ref 15–41)
Albumin: 4.2 g/dL (ref 3.5–5.0)
Alkaline Phosphatase: 74 U/L (ref 38–126)
Anion gap: 8 (ref 5–15)
BUN: 10 mg/dL (ref 6–20)
CO2: 26 mmol/L (ref 22–32)
Calcium: 9.1 mg/dL (ref 8.9–10.3)
Chloride: 106 mmol/L (ref 98–111)
Creatinine: 0.81 mg/dL (ref 0.44–1.00)
GFR, Estimated: >60 mL/min (ref >60)
Glucose, Bld: 228 mg/dL — ABNORMAL HIGH (ref 70–99)
Potassium: 4.2 mmol/L (ref 3.5–5.1)
Sodium: 140 mmol/L (ref 135–145)
Total Bilirubin: 0.4 mg/dL (ref 0.3–1.2)
Total Protein: 7.3 g/dL (ref 6.5–8.1)

## 2022-10-18 MED ORDER — OXYCODONE HCL 5 MG PO TABS: 5.0000 mg | ORAL_TABLET | Freq: Four times a day (QID) | ORAL | 0 refills | Status: DC | PRN

## 2022-10-18 MED ORDER — MORPHINE SULFATE ER 15 MG PO TBCR: 15.0000 mg | EXTENDED_RELEASE_TABLET | Freq: Three times a day (TID) | ORAL | 0 refills | Status: DC

## 2022-10-18 NOTE — Progress Notes (Signed)
[facility]  [address] [place],  [place]  [address] [phone]  Clinic Day: 10/18/22  Referring physician: Townsend Roger, MD  ASSESSMENT & PLAN:  Assessment & Plan: Essential thrombocytosis ([place]) Essential thrombocythemia, well controlled with hydroxyurea 500 mg 3 times a day, which she will continue. She is on chronic opioids due to bone pain. Today's labs are pending.  Anemia Her hemoglobin was stable at 10.3 . She last had colonoscopy in September 2017 and had a tubular adenoma removed from the sigmoid colon. She has seen Dr.Misenhimer's PA and routine colonoscopy is being scheduled.    Family history of pancreatic cancer First-degree relative, brother, with pancreatic cancer.  He declined genetic testing.  Genetic testing has been recommended for the patient, but not done due to lack of insurance coverage. That brother has expired but now her other brother ha also been diagnosed with pancreatic cancer and is going for surgery at [facility].    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 This has slowly increased since 2016.  Now it has increased significantly to 3.8 cm in maximum diameter and appears to communicate with the main pancreatic duct.  This is likely a slow-growing tumor but I am concerned in view of her family history.  I think she needs to be referred to gastroenterology for an EUS which will need to be done out of town. They may do a biopsy at the time.   History of colon polyps She had a tubular adenoma resected from the sigmoid colon and September 2017.  She is due for a repeat colonoscopy and will be scheduled for this soon.    Plan: I will contact Dr.Misenhimer's PA to make sure they are aware of the finding s of the scan in November 2023. I will refer her to a gastroenterologist in [facility] for EUS and possible biopsy. I explained the  procedure and the rationale to her. We can otherwise sti treat her here. If she requires surgery she will need a referral elsewhere. Labs are pending. We will see her back in 1 month with CBC and CMP. The patient understands the plans discussed today and is in agreement with them.  She knows to contact our office if she develops concerns prior to her next appointment.     Hosie Poisson MD  [facility] [address] [place] [address] Dept: [phone] Dept Fax: [phone]      CHIEF COMPLAINT:  CC: Essential thrombocythemia  Current Treatment:  Hydroxyurea 500 mg 3 times daily   HISTORY OF PRESENT ILLNESS:  Allison Stevens is a 57 y.o. female with a history of essential thrombocythemia originally diagnosed in January 2005.  This has been treated with hydroxyurea, but the patient has often been non-compliant.  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  She has persistent pain in the left upper quadrant, attributed to splenomegaly, as well as bone pain, for which she remains on OxyContin with oxycodone for breakthrough.  We have tapered her opioids down over time. She saw Dr. Lacinda Axon, her gastroenterologist at [facility], and underwent an esophageal dilatation in August 2016.  She had already had a colonoscopy due to previous gastrointestinal complaints.  We have been following a lesion in the pancreas seen incidentally on CT done in the emergency room for abdominal pain.  This  has been felt to be a benign cyst.  Repeat MRI abdomen in November 2016, revealed a slight increase in the benign appearing cyst of the pancreas.  No splenomegaly was seen.  Repeat MRI abdomen in 1 year was recommended.  She was hospitalized in September 2017 and underwent colonoscopy with removal of a small polyp in the sigmoid colon.  Small internal hemorrhoids were also seen.  Dr. Lyndel Safe also obtained a CEA and CA  19-9, and. the CA 19-9 was elevated at 145, the CEA was normal. Repeat MRI abdomen with contrast in September 2017 revealed a stable 14 mm cystic lesion in the pancreas.  Again, no splenomegaly was seen.  Follow-up MRI in 1 year was recommended.  Repeat MRI abdomen in March 2019 revealed a 17 mm cystic lesion in the pancreatic uncinate process, which was felt to be stable.  No significant liver lesions were seen.  Repeat MRI abdomen in 1 year was recommended.  Repeat MRI abdomen in September 2019 revealed a stable 19 mm cystic lesion of the pancreas, which appeared to be benign and still felt to be stable.  Repeat MRI abdomen with and without contrast in 1 year was recommended.  MRI abdomen in October 2020 revealed a stable 17 mm cystic lesion of the pancreas.  Continued yearly follow up MRI abdomen was recommended.   Screening mammogram in February 2016 revealed a possible mass in the right breast.  Right diagnostic mammogram and ultrasound was done and revealed 2 well circumscribed hypoechoic lesions in the right breast at 11 o 'clock, felt to represent cysts.  Repeat mammogram and ultrasound in August revealed heterogeneous fibroglandular tissue felt to be benign, with resolution of the previously seen cysts and no masses.  Repeat diagnostic bilateral mammogram and ultrasound in 6 months was once again recommended.  Bilateral diagnostic mammogram in March 2017 did not reveal any evidence of malignancy, so 1 year follow-up screening mammogram was recommended.  Bilateral screening mammogram in March 2018 did not reveal any evidence of malignancy.  Bone density scan in June 2018 revealed osteopenia with a T-score of -1.5 in the femur.  The patient was instructed to take calcium and vitamin-D twice daily.  The patient's family history is significant in that her father had pancreatic cancer at age 33, a paternal uncle had pancreatic cancer and a paternal aunt had pancreatic cancer.  Her brother also had pancreatic  cancer at age 61.  She is appropriate for genetic testing, but this has not been done yet due to insurance coverage.  Bilateral screening mammogram in December 2020 did not reveal any evidence of malignancy.    Repeat MRI abdomen in September 2021 revealed a 1.9 cm cystic lesion in the pancreatic uncinate process, which showed no significant change compared to more recent exams, but has shown a slow increase in size since 2016. 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 Follow-up by MRI in 2 years was recommended. She reports having another stroke in May 2022.  She then had COVID later in May and was hospitalized due to this.  She had been on hydroxyurea 4 times a day, but this was reduced in September to 3 times a day as her platelets were controlled and she was more anemic with a hemoglobin of 10. Repeat CBC in November revealed her hemoglobin to be back up to 11.6.  Her last colonoscopy was in September 2017 and she did have a polypectomy of a tubular  adenoma of the sigmoid colon at that time.  He also had an EGD in April of 2018, which showed mild gastritis, a small hiatal hernia, and a Schatzki ring.  She also had esophageal dilation.  INTERVAL HISTORY:  Allison Stevens is here today for repeat clinical assessment. She saw Dr.Misenheimer's PA this morning but did not mention anything about the scan from November. She states that she was told she was due for a colonoscopy. Her scan from November of 2023 reveals significant interval enlargement of a thinly septated cystic lesion of the central pancreatic head, now measuring 3.8 x 3.0 cm, previously 2.5 x 2.0 cm. This appears to communicate with the adjacent main pancreatic duct, and as previously reported, this has been observed to grow very slowly over a long period of time on examinations dating back to at least 2015. She does note that her stomach has been sore, she pointed mainly on the  right lower side. She also notes that her husband noticed she is easily bruising. She notes itching, dizziness, and nausea with taking the morphine. We will switch the dose to 15 mg 3 times a day. She has requested a refill of her 5 mg Oxycodone. She denies any bleeding, melena, hematochezia or hematemesis.  She denies other neurologic symptoms, such as headaches, vision changes, new paresthesias or weakness. She denies fevers or chills. She denies pain. Her appetite is good. Her weight has increased 3 pounds since her last visit.    REVIEW OF SYSTEMS:  Review of Systems  Constitutional: Negative.  Negative for appetite change, chills, fatigue, fever and unexpected weight change.  HENT:  Negative.  Negative for lump/mass, mouth sores and sore throat.   Eyes: Negative.   Respiratory: Negative.  Negative for cough and shortness of breath.   Cardiovascular: Negative.  Negative for chest pain and leg swelling.  Gastrointestinal: Negative.  Negative for abdominal pain, constipation, diarrhea, nausea and vomiting.  Endocrine: Negative.  Negative for hot flashes.  Genitourinary: Negative.  Negative for difficulty urinating, dysuria, frequency and hematuria.   Musculoskeletal:  Positive for arthralgias (right hip since fall) and gait problem (limping due to previous stroke). Negative for back pain and myalgias.  Skin:  Negative for rash.  Neurological:  Positive for gait problem (limping due to previous stroke). Negative for dizziness, headaches and numbness.  Hematological:  Negative for adenopathy. Bruises/bleeds easily.  Psychiatric/Behavioral:  Negative for depression and sleep disturbance. The patient is not nervous/anxious.      VITALS:  There were no vitals taken for this visit.  Wt Readings from Last 3 Encounters:  08/17/22 151 lb 8 oz (68.7 kg)  08/08/22 149 lb 12.8 oz (67.9 kg)  06/13/22 150 lb 3.2 oz (68.1 kg)    There is no height or weight on file to calculate BMI.  Performance  status (ECOG): 1 - Symptomatic but completely ambulatory  PHYSICAL EXAM:  Physical Exam Vitals and nursing note reviewed.  Constitutional:      General: She is not in acute distress.    Appearance: Normal appearance.  HENT:     Head: Normocephalic and atraumatic.     Mouth/Throat:     Mouth: Mucous membranes are moist.     Pharynx: Oropharynx is clear. No oropharyngeal exudate or posterior oropharyngeal erythema.  Eyes:     General: No scleral icterus.    Extraocular Movements: Extraocular movements intact.     Conjunctiva/sclera: Conjunctivae normal.     Pupils: Pupils are equal, round, and reactive to light.  Cardiovascular:     Rate and Rhythm: Normal rate and regular rhythm.     Heart sounds: Normal heart sounds. No murmur heard.    No friction rub. No gallop.  Pulmonary:     Effort: Pulmonary effort is normal.     Breath sounds: Normal breath sounds. No wheezing, rhonchi or rales.  Abdominal:     General: There is no distension.     Palpations: Abdomen is soft. There is no hepatomegaly, splenomegaly or mass.     Tenderness: There is no abdominal tenderness.  Musculoskeletal:        General: Normal range of motion.     Cervical back: Normal range of motion and neck supple. No tenderness.     Right lower leg: No edema.     Left lower leg: No edema.  Lymphadenopathy:     Cervical: No cervical adenopathy.     Upper Body:     Right upper body: No supraclavicular or axillary adenopathy.     Left upper body: No supraclavicular or axillary adenopathy.     Lower Body: No right inguinal adenopathy. No left inguinal adenopathy.  Skin:    General: Skin is warm and dry.     Coloration: Skin is not jaundiced.     Findings: No rash.  Neurological:     Mental Status: She is alert and oriented to person, place, and time.     Cranial Nerves: No cranial nerve deficit.  Psychiatric:        Mood and Affect: Mood normal.        Behavior: Behavior normal.        Thought Content:  Thought content normal.    LABS:      Latest Ref Rng & Units 08/08/2022   11:09 AM 05/30/2022   12:00 AM 02/08/2022   12:00 AM  CBC  WBC 3.4 - 10.8 x10E3/uL 3.3  4.0     4.0      Hemoglobin 11.1 - 15.9 g/dL 10.4  10.3     10.5      Hematocrit 34.0 - 46.6 % 30.9  30     31       Platelets 150 - 450 x10E3/uL 265  290     291         This result is from an external source.       Latest Ref Rng & Units 08/08/2022   11:09 AM 05/30/2022   12:00 AM 02/08/2022   12:00 AM  CMP  Glucose 70 - 99 mg/dL 262     BUN 6 - 24 mg/dL 7  9     8       Creatinine 0.57 - 1.00 mg/dL 0.86  0.7     0.7      Sodium 134 - 144 mmol/L 143  141     141      Potassium 3.5 - 5.2 mmol/L 4.3  3.9     3.4      Chloride 96 - 106 mmol/L 104  107     107      CO2 20 - 29 mmol/L 20  29     28       Calcium 8.7 - 10.2 mg/dL 9.7  9.2     9.1      Total Protein 6.0 - 8.5 g/dL 6.5     Total Bilirubin 0.0 - 1.2 mg/dL 0.6     Alkaline Phos 44 - 121 IU/L 102  73  69      AST 0 - 40 IU/L 21  33     31      ALT 0 - 32 IU/L 26  33     29         This result is from an external source.      No results found for: "CEA1", "CEA" / No results found for: "CEA1", "CEA" No results found for: "PSA1" No results found for: "[id number]" No results found for: "CAN125"  No results found for: "TOTALPROTELP", "ALBUMINELP", "A1GS", "A2GS", "BETS", "BETA2SER", "GAMS", "MSPIKE", "SPEI" Lab Results  Component Value Date   TIBC 259 08/08/2022   TIBC 301 11/09/2021   FERRITIN 134 08/08/2022   FERRITIN 59 11/09/2021   IRONPCTSAT 42 08/08/2022   IRONPCTSAT 26 11/09/2021   No results found for: "LDH"  STUDIES:  EXAM:06/09/22 KSMS IMPRESSION: Significant interval enlargement of a thinly septated cystic lesion of the central pancreatic head, now measuring 3.8 x 3.0 cm, previously 2.5 x 2.0 cm. This appears to communicate with the adjacent main pancreatic duct, and as previously reported, this has been observed to grow very slowly over a  long period of time on examinations dating back to at least 2015. 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 Given substantial interval growth and size and greater than 2.5 cm, recommend EUS/FNA for definitive diagnosis. This recommendation follows ACR white paper guidelines for the management of incidental pancreatic cystic lesions. Status post cholecystectomy.    HISTORY:   Past Medical History:  Diagnosis Date   Acid reflux    Anxiety    Carpal tunnel syndrome    Cerebrovascular disease    Diabetic retinopathy ([place])    Family history of pancreatic cancer 08/09/2021   Gait abnormality    History of stroke    Hyperlipidemia    Hypertension    Migraine    Pancreatic cyst 08/09/2021   Retinal detachment    Thrombocytosis    Trigger finger    Ulnar nerve entrapment     Past Surgical History:  Procedure Laterality Date   CESAREAN SECTION  1997   GALLBLADDER SURGERY  2011    Family History  Problem Relation Age of Onset   Diabetes Mother    Cancer Mother    Cancer Father    Healthy Child     Social History:  reports that she has never smoked. She has never used smokeless tobacco. She reports that she does not drink alcohol and does not use drugs.The patient is alone today.  Allergies:  Allergies  Allergen Reactions   Iodinated Contrast Media Other (See Comments) and Shortness Of Breath    Current Medications: Current Outpatient Medications  Medication Sig Dispense Refill   atorvastatin (LIPITOR) 40 MG tablet Take 1 tablet (40 mg total) by mouth at bedtime. 90 tablet 1   ALPRAZolam (XANAX) 0.5 MG tablet Take 1 tablet (0.5 mg total) by mouth every 8 (eight) hours as needed for anxiety. 90 tablet 2   amLODipine (NORVASC) 10 MG tablet Take 10 mg by mouth daily.     aspirin 81 MG chewable tablet Chew 1 tablet by mouth daily.     carvedilol (COREG) 12.5 MG tablet Take 12.5 mg by mouth 2 (two) times daily.     clopidogrel (PLAVIX) 75 MG tablet Take 1 tablet (75 mg total) by  mouth daily. 90 tablet 3   diclofenac Sodium (VOLTAREN) 1 % GEL Apply 4 g topically 4 (four) times daily.     enalapril (  VASOTEC) 20 MG tablet Take 20 mg by mouth 2 (two) times daily.     glimepiride (AMARYL) 2 MG tablet Take by mouth.     hydrochlorothiazide (HYDRODIURIL) 12.5 MG tablet Take 12.5 mg by mouth daily.     hydroxyurea (HYDREA) 500 MG capsule TAKE 1 CAPSULE BY MOUTH THREE TIMES A DAY 270 capsule 1   ibuprofen (ADVIL,MOTRIN) 800 MG tablet Take 800 mg by mouth every 8 (eight) hours as needed.     Insulin Detemir (LEVEMIR) 100 UNIT/ML Pen Inject into the skin.     Insulin Pen Needle (BD PEN NEEDLE NANO 2ND GEN) 32G X 4 MM MISC USE AS DIRECTED 100 each 3   lidocaine (XYLOCAINE) 1 % (with preservative) injection by Infiltration route.     metFORMIN (GLUCOPHAGE) 1000 MG tablet Take 1,000 mg by mouth 2 (two) times daily with a meal.     metoCLOPramide (REGLAN) 5 MG tablet Take 5 mg by mouth every 8 (eight) hours.     montelukast (SINGULAIR) 10 MG tablet Take 10 mg by mouth at bedtime.     morphine (MS CONTIN) 30 MG 12 hr tablet Take 1 tablet (30 mg total) by mouth every 12 (twelve) hours. 60 tablet 0   niacin 500 MG tablet Take 500 mg by mouth at bedtime.     NOVOLOG FLEXPEN 100 UNIT/ML FlexPen SMARTSIG:34 Unit(s) SUB-Q 3 Times Daily 15 mL 3   oxyCODONE (OXY IR/ROXICODONE) 5 MG immediate release tablet Take 1 tablet (5 mg total) by mouth every 6 (six) hours as needed for severe pain. 120 tablet 0   pantoprazole (PROTONIX) 40 MG tablet Take 1 tablet (40 mg total) by mouth daily. 30 tablet 6   Potassium Chloride ER 20 MEQ TBCR Take 1 tablet by mouth daily.     prochlorperazine (COMPAZINE) 10 MG tablet Take 10 mg by mouth every 6 (six) hours as needed for nausea or vomiting.     spironolactone (ALDACTONE) 25 MG tablet Take 0.5 tablets by mouth daily.     TOUJEO MAX SOLOSTAR 300 UNIT/ML Solostar Pen SMARTSIG:80 Unit(s) SUB-Q Every Night 300 mL 1   triamcinolone acetonide (TRIESENCE) 40 MG/ML  SUSP Inject into the articular space.     TRULICITY 4.5 0000000 SOPN SMARTSIG:1 Pre-Filled Pen Syringe SUB-Q Once a Week     No current facility-administered medications for this visit.    I,Gabriella Ballesteros,acting as a scribe for Derwood Kaplan, MD.,have documented all relevant documentation on the behalf of Derwood Kaplan, MD,as directed by  Derwood Kaplan, MD while in the presence of Derwood Kaplan, MD.

## 2022-10-18 NOTE — Telephone Encounter (Signed)
Pt called to ask if you have sent in prescription for pain med yet?

## 2022-10-18 NOTE — Telephone Encounter (Signed)
-----   Message from Derwood Kaplan, MD sent at 10/18/2022 10:47 AM EDT ----- Regarding: MRI Pls send November MRI of abd to Dr. Lyda Jester

## 2022-10-18 NOTE — Telephone Encounter (Signed)
Report faxed.

## 2022-10-21 ENCOUNTER — Other Ambulatory Visit: Payer: Self-pay

## 2022-10-21 ENCOUNTER — Telehealth: Payer: Self-pay

## 2022-10-21 DIAGNOSIS — D473 Essential (hemorrhagic) thrombocythemia: Secondary | ICD-10-CM

## 2022-10-21 NOTE — Telephone Encounter (Signed)
Opened in error

## 2022-10-21 NOTE — Telephone Encounter (Signed)
-----   Message from Derwood Kaplan, MD sent at 10/20/2022  8:11 PM EDT ----- Regarding: labs Tell her labs look good except BS 228, but was non-fasting.  Her blood counts are still low from the Hydrea, I recommend going down to 2 daily instead of 3. Send copy to Dr. Nona Dell

## 2022-10-24 ENCOUNTER — Telehealth: Payer: Self-pay

## 2022-10-24 NOTE — Telephone Encounter (Signed)
Pt called to report she is having uncontrolled pain w/ just the oxycodone. "The lower dose is not working". She rates her pain 6/10 @ present. She called over the weekend & spoke with the answering service/nurse line. Pt was patched thru to Dr., who told her to take only 1 extra 5mg  dose of the oxycodone & call the office this morning.   She is not taking the Morphine, as "it made her feel out of her mind and nauseated. I took a nausea pill but it didn't help". She is only taking the oxycodone 5mg  po q 6hr. Please advise.

## 2022-10-25 ENCOUNTER — Other Ambulatory Visit: Payer: Self-pay

## 2022-10-25 MED ORDER — OXYCODONE HCL 5 MG PO TABS: 10.0000 mg | ORAL_TABLET | Freq: Four times a day (QID) | ORAL | 0 refills | Status: DC | PRN

## 2022-10-26 ENCOUNTER — Telehealth: Payer: Self-pay

## 2022-10-26 NOTE — Telephone Encounter (Signed)
Pt called and asked if there is anything else she could try for longer acting pain med?

## 2022-10-27 ENCOUNTER — Other Ambulatory Visit: Payer: Self-pay | Admitting: Oncology

## 2022-10-27 DIAGNOSIS — D473 Essential (hemorrhagic) thrombocythemia: Secondary | ICD-10-CM

## 2022-10-27 MED ORDER — FENTANYL 25 MCG/HR TD PT72: 1.0000 | MEDICATED_PATCH | TRANSDERMAL | 0 refills | Status: DC

## 2022-11-09 ENCOUNTER — Encounter: Payer: Self-pay | Admitting: Internal Medicine

## 2022-11-09 ENCOUNTER — Ambulatory Visit: Payer: Medicare Other | Admitting: Internal Medicine

## 2022-11-09 VITALS — BP 126/84 | HR 72 | Temp 98.0°F | Resp 16 | Ht 64.0 in | Wt 152.0 lb

## 2022-11-09 DIAGNOSIS — I10 Essential (primary) hypertension: Secondary | ICD-10-CM | POA: Diagnosis not present

## 2022-11-09 DIAGNOSIS — E11311 Type 2 diabetes mellitus with unspecified diabetic retinopathy with macular edema: Secondary | ICD-10-CM

## 2022-11-09 DIAGNOSIS — E785 Hyperlipidemia, unspecified: Secondary | ICD-10-CM

## 2022-11-09 NOTE — Assessment & Plan Note (Signed)
Her BP is doing well today and is controlled.

## 2022-11-09 NOTE — Addendum Note (Signed)
Addended by: Crist Fat on: 11/09/2022 10:29 AM   Modules accepted: Orders

## 2022-11-09 NOTE — Assessment & Plan Note (Signed)
She has noncompliance and is changing her insulins around.  I expect her A1c to be worse today.  We cannot get her into endocrinology but we may need to refer her to [facility].  We will check a HgBA1c on her today.

## 2022-11-09 NOTE — Progress Notes (Signed)
Office Visit  Subjective   Patient ID: Allison Stevens   DOB: 1965-09-07   Age: 57 y.o.   MRN: [id number]   Chief Complaint Chief Complaint  Patient presents with   Follow-up     History of Present Illness The patient is a 57 year old African American/Black female who returns for a follow-up visit for her T2 diabetes.  On her last visit, her A1c was elevated and I tried to refer her to endocrinology.  I am told by my referral clerk that none of the endocrinologists in the area will take her due to her having appointments in the past and she would no show.  She did see my NP in 06/2022 where she was reporting some hypoglycemia and reported drinking a lot of soda.  She was supposed to decrease her novolog to 25 Units daily but the patient states that she takes 28 Units usually about twice a day with meals.  She remains Toujeo 68 Units daily ( she was on 80 Units 3 months ago but she states her blood sugars have been dropping), Trulicity 4.5mg  subcut once a week (she stopped this about 2 weeks ago due to her having nausea), Novolog 38 Units (she told me on her last visit she was on 28 Units) subcut usually twice a day and metformin 1000mg  BID.  She is not walking as much as they would like.  She specifically denies shortness of breath, unexplained fatigue, palpitations or racing heartbeats, syncopal episodes, and unexplained abdominal pain, nausea or vomiting.  She states she is having hypoglycemia about once a week where her FSBS will get down to the 60's around 4-5 PM and she will have symptoms.  She states she has been eating.  These low blood sugars are why she went down on her dose of toujeo.  She is now checking her blood sugars every other day and they range 110-220.  She came in fasting today in anticipation of lab work. Her last HgbA1c was done in 02/24/2022 and was 7.7%.  She has complications with diabetes with a history of diabetic retinopathy.  There are no complications of diabetic  neuropathy, nephropathy or vascular disease.  Her last dilated eye exam was done on 02/03/2022 that showed mild diabetic retinopathy with mild paramacular edema in her eyes.  He wants to see her back in 1 year.    The patient is a 57 year old African American/Black female who presents for a follow-up evaluation of hypertension.  On her last visit, her BP was elevated.  Since her last visit, she has not had any problems.  The patient has been checking her blood pressure at home. The patient's blood pressure has ranged systollically from 130-150's. SThe patient's current medications include: amlodpine 10mg  daily, enalapril maleate 20 mg BID, and hydrochlorothiazide 12.5 mg daily.   The patient has been tolerating her medications well. The patient denies any visual changes, lightheadness, shortness of breath, orthopnea, and weakness/numbness. She reports there have been no other symptoms noted.    Allison Stevens returns today for routine followup on her cholesterol. On her last visit, her cholesterol was not controlled with her history of stroke.  We changed her pravastatin to lipitor at that time and she has not had any problems.  Overall, she states she is doing well and is without any complaints or problems at this time. She specifically denies chest pain, abdominal pain, nausea, diarrhea, and myalgias. She remains on dietary management as well  as the following cholesterol lowering medications atorvastatin 40 mg oral tablet qhs. She is fasting in anticipation for labs today.       Past Medical History Past Medical History:  Diagnosis Date   Acid reflux    Anxiety    Carpal tunnel syndrome    Cerebrovascular disease    Diabetic retinopathy    Family history of pancreatic cancer 08/09/2021   Gait abnormality    History of stroke    Hyperlipidemia    Hypertension    Migraine    Pancreatic cyst 08/09/2021   Retinal detachment    Thrombocytosis    Trigger finger    Ulnar nerve entrapment       Allergies Allergies  Allergen Reactions   Iodinated Contrast Media Other (See Comments) and Shortness Of Breath   Morphine Itching and Other (See Comments)    "Makes me feel out of my mind" per pt.     Medications  Current Outpatient Medications:    atorvastatin (LIPITOR) 40 MG tablet, Take 1 tablet (40 mg total) by mouth at bedtime., Disp: 90 tablet, Rfl: 1   ALPRAZolam (XANAX) 0.5 MG tablet, Take 1 tablet (0.5 mg total) by mouth every 8 (eight) hours as needed for anxiety., Disp: 90 tablet, Rfl: 2   amLODipine (NORVASC) 10 MG tablet, Take 10 mg by mouth daily., Disp: , Rfl:    aspirin 81 MG chewable tablet, Chew 1 tablet by mouth daily., Disp: , Rfl:    carvedilol (COREG) 12.5 MG tablet, Take 12.5 mg by mouth 2 (two) times daily., Disp: , Rfl:    clopidogrel (PLAVIX) 75 MG tablet, Take 1 tablet (75 mg total) by mouth daily., Disp: 90 tablet, Rfl: 3   diclofenac Sodium (VOLTAREN) 1 % GEL, Apply 4 g topically 4 (four) times daily., Disp: , Rfl:    enalapril (VASOTEC) 20 MG tablet, Take 20 mg by mouth 2 (two) times daily., Disp: , Rfl:    fentaNYL (DURAGESIC) 25 MCG/HR, Place 1 patch onto the skin every 3 (three) days., Disp: 5 patch, Rfl: 0   fluticasone (FLONASE) 50 MCG/ACT nasal spray, 1 spray as needed., Disp: , Rfl:    fluticasone (FLOVENT HFA) 44 MCG/ACT inhaler, , Disp: , Rfl:    glimepiride (AMARYL) 2 MG tablet, Take by mouth., Disp: , Rfl:    hydrochlorothiazide (HYDRODIURIL) 12.5 MG tablet, Take 12.5 mg by mouth daily., Disp: , Rfl:    hydroxyurea (HYDREA) 500 MG capsule, Take 500 mg by mouth 2 (two) times daily. May take with food to minimize GI side effects., Disp: , Rfl:    ibuprofen (ADVIL,MOTRIN) 800 MG tablet, Take 800 mg by mouth every 8 (eight) hours as needed., Disp: , Rfl:    Insulin Detemir (LEVEMIR) 100 UNIT/ML Pen, Inject into the skin., Disp: , Rfl:    Insulin Pen Needle (BD PEN NEEDLE NANO 2ND GEN) 32G X 4 MM MISC, USE AS DIRECTED, Disp: 100 each, Rfl: 3    lidocaine (XYLOCAINE) 1 % (with preservative) injection, by Infiltration route., Disp: , Rfl:    metFORMIN (GLUCOPHAGE) 1000 MG tablet, Take 1,000 mg by mouth 2 (two) times daily with a meal., Disp: , Rfl:    metoCLOPramide (REGLAN) 5 MG tablet, Take 5 mg by mouth every 8 (eight) hours., Disp: , Rfl:    NOVOLOG FLEXPEN 100 UNIT/ML FlexPen, SMARTSIG:34 Unit(s) SUB-Q 3 Times Daily, Disp: 15 mL, Rfl: 3   oxyCODONE (OXY IR/ROXICODONE) 5 MG immediate release tablet, Take 2 tablets (10 mg total) by  mouth every 6 (six) hours as needed for severe pain., Disp: 80 tablet, Rfl: 0   pantoprazole (PROTONIX) 40 MG tablet, Take 1 tablet (40 mg total) by mouth daily., Disp: 30 tablet, Rfl: 6   Potassium Chloride ER 20 MEQ TBCR, Take 1 tablet by mouth daily., Disp: , Rfl:    prochlorperazine (COMPAZINE) 10 MG tablet, Take 10 mg by mouth every 6 (six) hours as needed for nausea or vomiting., Disp: , Rfl:    spironolactone (ALDACTONE) 25 MG tablet, Take 0.5 tablets by mouth daily., Disp: , Rfl:    TOUJEO MAX SOLOSTAR 300 UNIT/ML Solostar Pen, SMARTSIG:80 Unit(s) SUB-Q Every Night, Disp: 300 mL, Rfl: 1   triamcinolone acetonide (TRIESENCE) 40 MG/ML SUSP, Inject into the articular space., Disp: , Rfl:    TRULICITY 4.5 MG/0.5ML SOPN, SMARTSIG:1 Pre-Filled Pen Syringe SUB-Q Once a Week, Disp: , Rfl:    Review of Systems Review of Systems  Constitutional:  Negative for chills and fever.  Eyes:  Negative for blurred vision and double vision.  Respiratory:  Negative for cough and shortness of breath.   Cardiovascular:  Negative for chest pain, palpitations and leg swelling.  Gastrointestinal:  Negative for abdominal pain, constipation, diarrhea, nausea and vomiting.  Genitourinary:  Negative for frequency and hematuria.  Musculoskeletal:  Negative for myalgias.  Neurological:  Negative for dizziness, weakness and headaches.  Psychiatric/Behavioral:  Negative for depression. The patient is not nervous/anxious.         Objective:    Vitals BP 126/84 (BP Location: Right Arm, Patient Position: Sitting, Cuff Size: Normal)   Pulse 72   Temp 98 F (36.7 C) (Temporal)   Resp 16   Ht 5\' 4"  (1.626 m)   Wt 152 lb (68.9 kg)   SpO2 91%   BMI 26.09 kg/m    Physical Examination Physical Exam Constitutional:      Appearance: Normal appearance. She is not ill-appearing.  Cardiovascular:     Rate and Rhythm: Normal rate and regular rhythm.     Pulses: Normal pulses.     Heart sounds: No murmur heard.    No friction rub. No gallop.  Pulmonary:     Effort: Pulmonary effort is normal. No respiratory distress.     Breath sounds: No wheezing, rhonchi or rales.  Abdominal:     General: Bowel sounds are normal. There is no distension.     Palpations: Abdomen is soft.     Tenderness: There is no abdominal tenderness.  Musculoskeletal:     Right lower leg: No edema.     Left lower leg: No edema.  Skin:    General: Skin is warm and dry.     Findings: No rash.  Neurological:     General: No focal deficit present.     Mental Status: She is alert and oriented to person, place, and time.  Psychiatric:        Mood and Affect: Mood normal.        Behavior: Behavior normal.        Assessment & Plan:   Essential hypertension Her BP is doing well today and is controlled.  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  I expect her A1c to be worse today.  We cannot get her into endocrinology but we may need to refer her to [facility].  We will check a HgBA1c on her today.  Hyperlipidemia She  was changed to lipitor.  We will check a FLP on her today.    Return in about 3 months (around 02/08/2023).   Crist FatJason Van Eyk, MD

## 2022-11-09 NOTE — Assessment & Plan Note (Signed)
She was changed to lipitor.  We will check a FLP on her today.

## 2022-11-10 LAB — CMP14 + ANION GAP
ALT: 27 IU/L (ref 0–32)
AST: 21 IU/L (ref 0–40)
Albumin/Globulin Ratio: 1.7 (ref 1.2–2.2)
Albumin: 4 g/dL (ref 3.8–4.9)
Alkaline Phosphatase: 100 IU/L (ref 44–121)
Anion Gap: 16 mmol/L (ref 10.0–18.0)
BUN/Creatinine Ratio: 10 (ref 9–23)
BUN: 7 mg/dL (ref 6–24)
Bilirubin Total: 0.4 mg/dL (ref 0.0–1.2)
CO2: 21 mmol/L (ref 20–29)
Calcium: 9.1 mg/dL (ref 8.7–10.2)
Chloride: 105 mmol/L (ref 96–106)
Creatinine, Ser: 0.72 mg/dL (ref 0.57–1.00)
Globulin, Total: 2.4 g/dL (ref 1.5–4.5)
Glucose: 268 mg/dL — ABNORMAL HIGH (ref 70–99)
Potassium: 3.9 mmol/L (ref 3.5–5.2)
Sodium: 142 mmol/L (ref 134–144)
Total Protein: 6.4 g/dL (ref 6.0–8.5)
eGFR: 97 mL/min/{1.73_m2} (ref >59)

## 2022-11-10 LAB — LIPID PANEL
Chol/HDL Ratio: 4.4 ratio (ref 0.0–4.4)
Cholesterol, Total: 169 mg/dL (ref 100–199)
HDL: 38 mg/dL — ABNORMAL LOW (ref >39)
LDL Chol Calc (NIH): 116 mg/dL — ABNORMAL HIGH (ref 0–99)
Triglycerides: 79 mg/dL (ref 0–149)
VLDL Cholesterol Cal: 15 mg/dL (ref 5–40)

## 2022-11-10 LAB — HEMOGLOBIN A1C
Est. average glucose Bld gHb Est-mCnc: 169 mg/dL
Hgb A1c MFr Bld: 7.5 % — ABNORMAL HIGH (ref 4.8–5.6)

## 2022-11-11 NOTE — Progress Notes (Signed)
[facility]  [address] [place],  [place]  [address] [phone]  Clinic Day: 11/16/22  Referring physician: Crist Fat, MD  ASSESSMENT & PLAN:  Assessment & Plan: Essential thrombocytosis (HCC) Essential thrombocythemia, well controlled with hydroxyurea 500 mg 3 times a day, which she will continue.  This dose was decreased from 4 times daily since she has worsening anemia and leukopenia. She is on chronic opioids due to bone pain. Today's labs are pending.  Anemia Her hemoglobin was stable at 10.3 . She last had colonoscopy in September 2017 and had a tubular adenoma removed from the sigmoid colon. She has seen Dr.Misenhimer's PA and routine colonoscopy is being scheduled.  I explained to the patient that he cannot perform an EUS with a biopsy of her pancreas.   Family history of pancreatic cancer First-degree relative, brother, with pancreatic cancer.  He declined genetic testing.  Genetic testing has been recommended for the patient, but not done due to lack of insurance coverage. That brother has expired but now her other brother ha also been diagnosed with pancreatic cancer and is going for surgery at [facility].    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 This has slowly increased since 2016.  Now it has increased significantly to 3.8 cm in maximum diameter and appears to communicate with the main pancreatic duct.  This is likely a slow-growing tumor but I am concerned in view of her family history.  I think she needs to be referred to gastroenterology for an EUS which will need to be done out of town. They may do a biopsy at the time. She never went for the referral as recommended.   Plan:  Patient states that she doesn't feel well, depressed, and has thoughts of dying however, she does not voice any suicidal ideation. She informed me that her walking  has worsened since 11/01/2022 and that her bones hurt. I will increase her 5mg  oxycodone to 10mg  because her insurance will not cover long acting oxycodone. She did not tolerate the morphine extended release. She is currently taking Hydrea 500mg   3 times daily. She will see Dr. Jennye Boroughs on 11/29/2022 for a colon/EGD but she believes that he will also evaluate the pancreas. She never went for the appointment that we referred for a EUS and now it has been many months since her MRI scan of the abdomen, which revealed enlargement of the pancreatic lesion. I think she has convinced herself that she has terminal cancer although we don't have a diagnosis. Her 1 brother expired of pancreatic cancer and her other brother is preparing for surgery of his pancreas. She also has an uncle and other relatives with a history of cancer and is understandably frightened. I will schedule to MRI of the abdomen to follow up on the pancreatic cysts. She is past due for her annual mammogram and I will work on scheduling that after her MRI. Her labs today are pending including a CEA and CA 19.9. I will see her back in 1 month with CBC and CMP. The patient understands the plans discussed today and is in agreement with them.  She knows to contact our office if she develops concerns prior to her next appointment.  I provided 20 minutes of face-to-face time during this this encounter and > 50% was spent counseling as documented under my assessment and plan.    Gery Pray  MD  [facility] AT [facility] [address] [place] [address] Dept: [phone] Dept Fax: [phone]      CHIEF COMPLAINT:  CC: Essential thrombocythemia  Current Treatment:  Hydroxyurea 500 mg 3 times daily   HISTORY OF PRESENT ILLNESS:  Allison Stevens is a 57 y.o. female with a history of essential thrombocythemia originally diagnosed in January 2005.  This has been treated  with hydroxyurea, but the patient has often been non-compliant.  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  She has persistent pain in the left upper quadrant, attributed to splenomegaly, as well as bone pain, for which she remains on OxyContin with oxycodone for breakthrough.  We have tapered her opioids down over time. She saw Dr. Adriana Simas, her gastroenterologist at [facility], and underwent an esophageal dilatation in August 2016.  She had already had a colonoscopy due to previous gastrointestinal complaints.  We have been following a lesion in the pancreas seen incidentally on CT done in the emergency room for abdominal pain.  This has been felt to be a benign cyst.  Repeat MRI abdomen in November 2016, revealed a slight increase in the benign appearing cyst of the pancreas.  No splenomegaly was seen.  Repeat MRI abdomen in 1 year was recommended.  She was hospitalized in September 2017 and underwent colonoscopy with removal of a small polyp in the sigmoid colon.  Small internal hemorrhoids were also seen.  Dr. Chales Abrahams also obtained a CEA and CA 19-9, and. the CA 19-9 was elevated at 145, the CEA was normal. Repeat MRI abdomen with contrast in September 2017 revealed a stable 14 mm cystic lesion in the pancreas.  Again, no splenomegaly was seen.  Follow-up MRI in 1 year was recommended.  Repeat MRI abdomen in March 2019 revealed a 17 mm cystic lesion in the pancreatic uncinate process, which was felt to be stable.  No significant liver lesions were seen.  Repeat MRI abdomen in 1 year was recommended.  Repeat MRI abdomen in September 2019 revealed a stable 19 mm cystic lesion of the pancreas, which appeared to be benign and still felt to be stable.  Repeat MRI abdomen with and without contrast in 1 year was recommended.  MRI abdomen in October 2020 revealed a stable 17 mm cystic lesion of the pancreas.  Continued yearly follow up MRI abdomen was recommended.   Screening mammogram in February  2016 revealed a possible mass in the right breast.  Right diagnostic mammogram and ultrasound was done and revealed 2 well circumscribed hypoechoic lesions in the right breast at 11 o 'clock, felt to represent cysts.  Repeat mammogram and ultrasound in August revealed heterogeneous fibroglandular tissue felt to be benign, with resolution of the previously seen cysts and no masses.  Repeat diagnostic bilateral mammogram and ultrasound in 6 months was once again recommended.  Bilateral diagnostic mammogram in March 2017 did not reveal any evidence of malignancy, so 1 year follow-up screening mammogram was recommended.  Bilateral screening mammogram in March 2018 did not reveal any evidence of malignancy.  Bone density scan in June 2018 revealed osteopenia with a T-score of -1.5 in the femur.  The patient was instructed to take calcium and vitamin-D twice daily.  The patient's family history is significant in that her father had pancreatic cancer at age 26, a paternal uncle had pancreatic cancer and a paternal aunt had pancreatic cancer.  Her brother also had pancreatic cancer  at age 45.  She is appropriate for genetic testing, but this has not been done yet due to insurance coverage.  Bilateral screening mammogram in December 2020 did not reveal any evidence of malignancy.    Repeat MRI abdomen in September 2021 revealed a 1.9 cm cystic lesion in the pancreatic uncinate process, which showed no significant change compared to more recent exams, but has shown a slow increase in size since 2016. 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 Follow-up by MRI in 2 years was recommended. She reports having another stroke in May 2022.  She then had COVID later in May and was hospitalized due to this.  She had been on hydroxyurea 4 times a day, but this was reduced in September to 3 times a day as her platelets were controlled and she was more anemic with  a hemoglobin of 10. Repeat CBC in November revealed her hemoglobin to be back up to 11.6.  Her last colonoscopy was in September 2017 and she did have a polypectomy of a tubular adenoma of the sigmoid colon at that time.  He also had an EGD in April of 2018, which showed mild gastritis, a small hiatal hernia, and a Schatzki ring.  She also had esophageal dilation.  INTERVAL HISTORY:  Allison Stevens is here today for repeat clinical assessment essential thrombocythemia. Patient states that she doesn't feel well and states that she is fatigued, depressed and has thoughts of dying.  However, she does not voice any suicidal ideation. She informed me that her walking has worsened since 11/01/2022 and that her bones hurt. She doesn't want to take Fentanyl patches as she is scared of the effects, I will increase her 5 mg oxycodone to 10 mg because her insurance will not cover long acting oxycodone the longer. She did not tolerate the morphine extended release. She is currently taking Hydrea 500 mg  3 times daily. She will see Dr. Jennye Boroughs on 11/29/2022 for a colon/EGD but she believes that he will also evaluate the pancreas. She never went for the appointment that we referred for a EUS and now it has been many months since her MRI scan of the abdomen, which revealed enlargement of the pancreatic lesion. I think she has convinced herself that she has terminal cancer although we don't even have a diagnosis. Her 1 brother expired of pancreatic cancer and her other brother is preparing for surgery of his pancreas. She also has an uncle and other relatives with a history of cancer and is understandably frightened. I will schedule to MRI of the abdomen to follow up on the pancreatic cysts. She is past due for her annual mammogram and I will work on scheduling that after her MRI. Her labs today are pending including a CEA and CA 19.9. I will see her back in 1 month with CBC and CMP. She denies signs of infection such as sore  throat, sinus drainage, cough, or urinary symptoms.  She denies fevers or recurrent chills. She denies pain. She denies nausea, vomiting, chest pain, dyspnea or cough. Her appetite is poor and hasn't ate in 2 day. She has early satiety. Her weight has decreased 7 pounds over last week .  REVIEW OF SYSTEMS:  Review of Systems  Constitutional:  Positive for appetite change (early satiety) and unexpected weight change. Negative for chills, diaphoresis, fatigue and fever.  HENT:   Positive for voice change (hoarse voice). Negative for hearing loss, lump/mass, mouth  sores, nosebleeds, sore throat, tinnitus and trouble swallowing.   Eyes: Negative.  Negative for eye problems and icterus.  Respiratory: Negative.  Negative for chest tightness, cough, hemoptysis, shortness of breath and wheezing.   Cardiovascular: Negative.  Negative for chest pain, leg swelling and palpitations.  Gastrointestinal:  Positive for abdominal distention. Negative for abdominal pain, blood in stool, constipation, diarrhea, nausea, rectal pain and vomiting.  Endocrine: Negative.  Negative for hot flashes.  Genitourinary: Negative.  Negative for bladder incontinence, difficulty urinating, dyspareunia, dysuria, frequency, hematuria, menstrual problem, nocturia, pelvic pain, vaginal bleeding and vaginal discharge.   Musculoskeletal:  Positive for arthralgias (right hip since a fall) and gait problem (limping due to previous stroke, has worsened). Negative for back pain, flank pain, myalgias, neck pain and neck stiffness.       Bones hurt  Skin: Negative.  Negative for itching, rash and wound.  Neurological:  Positive for gait problem (limping due to previous stroke, has worsened). Negative for dizziness, extremity weakness, headaches, light-headedness, numbness, seizures and speech difficulty.  Hematological:  Negative for adenopathy. Bruises/bleeds easily.  Psychiatric/Behavioral:  Positive for depression. Negative for confusion,  decreased concentration, sleep disturbance and suicidal ideas. The patient is not nervous/anxious.      VITALS:  Blood pressure (!) 193/88, pulse 79, temperature 98.5 F (36.9 C), temperature source Oral, resp. rate 17, height 5\' 4"  (1.626 m), weight 145 lb 4.8 oz (65.9 kg), SpO2 100 %.  Wt Readings from Last 3 Encounters:  11/21/22 139 lb (63 kg)  11/16/22 145 lb 4.8 oz (65.9 kg)  11/09/22 152 lb (68.9 kg)    Body mass index is 24.94 kg/m.  Performance status (ECOG): 1 - Symptomatic but completely ambulatory  PHYSICAL EXAM:  Physical Exam Vitals and nursing note reviewed.  Constitutional:      General: She is not in acute distress.    Appearance: Normal appearance. She is normal weight. She is not ill-appearing, toxic-appearing or diaphoretic.  HENT:     Head: Normocephalic and atraumatic.     Right Ear: Tympanic membrane, ear canal and external ear normal. There is no impacted cerumen.     Left Ear: Tympanic membrane, ear canal and external ear normal. There is no impacted cerumen.     Nose: Nose normal. No congestion or rhinorrhea.     Mouth/Throat:     Mouth: Mucous membranes are moist.     Pharynx: Oropharynx is clear. No oropharyngeal exudate or posterior oropharyngeal erythema.  Eyes:     General: No scleral icterus.       Right eye: No discharge.        Left eye: No discharge.     Extraocular Movements: Extraocular movements intact.     Conjunctiva/sclera: Conjunctivae normal.     Pupils: Pupils are equal, round, and reactive to light.  Neck:     Vascular: No carotid bruit.  Cardiovascular:     Rate and Rhythm: Normal rate and regular rhythm.     Pulses: Normal pulses.     Heart sounds: Normal heart sounds. No murmur heard.    No friction rub. No gallop.  Pulmonary:     Effort: Pulmonary effort is normal. No respiratory distress.     Breath sounds: Normal breath sounds. No stridor. No wheezing, rhonchi or rales.  Chest:     Chest wall: No tenderness.   Abdominal:     General: Bowel sounds are normal. There is distension (mild).     Palpations: Abdomen is soft. There is  no hepatomegaly, splenomegaly or mass.     Tenderness: There is no abdominal tenderness. There is no right CVA tenderness, left CVA tenderness, guarding or rebound.     Hernia: No hernia is present.     Comments: Mild distension and gas build up in the central abdomen No masses Liver/Spleen normal    Musculoskeletal:        General: No swelling, tenderness, deformity or signs of injury. Normal range of motion.     Cervical back: Normal range of motion and neck supple. No rigidity or tenderness.     Right lower leg: No edema.     Left lower leg: No edema.  Lymphadenopathy:     Cervical: No cervical adenopathy.     Upper Body:     Right upper body: No supraclavicular or axillary adenopathy.     Left upper body: No supraclavicular or axillary adenopathy.     Lower Body: No right inguinal adenopathy. No left inguinal adenopathy.  Skin:    General: Skin is warm and dry.     Coloration: Skin is not jaundiced or pale.     Findings: No bruising, erythema, lesion or rash.  Neurological:     General: No focal deficit present.     Mental Status: She is alert and oriented to person, place, and time. Mental status is at baseline.     Cranial Nerves: No cranial nerve deficit.     Sensory: No sensory deficit.     Motor: No weakness.     Coordination: Coordination normal.     Gait: Gait normal.     Deep Tendon Reflexes: Reflexes normal.  Psychiatric:        Mood and Affect: Mood normal.        Behavior: Behavior normal.        Thought Content: Thought content normal.        Judgment: Judgment normal.    LABS:      Latest Ref Rng & Units 11/21/2022    8:57 AM 11/16/2022   10:04 AM 10/18/2022   10:08 AM  CBC  WBC 3.4 - 10.8 x10E3/uL 4.0  3.2  3.8   Hemoglobin 11.1 - 15.9 g/dL 16.1  09.6  04.5   Hematocrit 34.0 - 46.6 % 32.5  32.5  32.2   Platelets 150 - 450 x10E3/uL  272  275  203       Latest Ref Rng & Units 11/21/2022    8:57 AM 11/16/2022   10:04 AM 11/09/2022   10:34 AM  CMP  Glucose 70 - 99 mg/dL 409  811  914   BUN 6 - 24 mg/dL 23  12  7    Creatinine 0.57 - 1.00 mg/dL 7.82  9.56  2.13   Sodium 134 - 144 mmol/L 142  142  142   Potassium 3.5 - 5.2 mmol/L 3.9  3.3  3.9   Chloride 96 - 106 mmol/L 98  105  105   CO2 20 - 29 mmol/L 24  25  21    Calcium 8.7 - 10.2 mg/dL 9.7  9.1  9.1   Total Protein 6.0 - 8.5 g/dL 7.2  7.4  6.4   Total Bilirubin 0.0 - 1.2 mg/dL 0.8  0.6  0.4   Alkaline Phos 44 - 121 IU/L 102  74  100   AST 0 - 40 IU/L 24  21  21    ALT 0 - 32 IU/L 28  20  27     Lab Results  Component Value Date   CEA1 1.9 11/16/2022   /  CEA  Date Value Ref Range Status  11/16/2022 1.9 0.0 - 4.7 ng/mL Final    Comment:    (NOTE)                             Nonsmokers          <3.9                             Smokers             <5.6 [employer] Electrochemiluminescence Immunoassay (ECLIA) Values obtained with different assay methods or kits cannot be used interchangeably.  Results cannot be interpreted as absolute evidence of the presence or absence of malignant disease. Performed At: [facility] [address] [place], [place] [id number] Jolene Schimke MD WU:[id number]    No results found for: "PSA1" Lab Results  Component Value Date   CAN199 51 (H) 11/16/2022   No results found for: "CAN125"  No results found for: "TOTALPROTELP", "ALBUMINELP", "A1GS", "A2GS", "BETS", "BETA2SER", "GAMS", "MSPIKE", "SPEI" Lab Results  Component Value Date   TIBC 259 08/08/2022   TIBC 301 11/09/2021   FERRITIN 134 08/08/2022   FERRITIN 59 11/09/2021   IRONPCTSAT 42 08/08/2022   IRONPCTSAT 26 11/09/2021   Component Ref Range & Units 11/09/2022 3 mo ago  Cholesterol, Total 100 - 199 mg/dL 956 213  Triglycerides 0 - 149 mg/dL 79 82  HDL >08 mg/dL 38 Low  38 Low   VLDL Cholesterol Cal 5 - 40 mg/dL 15 16  LDL Chol Calc  (NIH) 0 - 99 mg/dL 657 High  95  Chol/HDL Ratio 0.0 - 4.4 ratio 4.4 3.9 CM   Component Ref Range & Units 11/09/2022 3 mo ago  Hgb A1c MFr Bld 4.8 - 5.6 % 7.5 High  7.7 High  CM  Est. average glucose Bld gHb Est-mCnc mg/dL 846 962   Component Ref Range & Units 08/08/2022  Vitamin B-12 232 - 1,245 pg/mL 307   Component Ref Range & Units 08/08/2022  TSH 0.450 - 4.500 uIU/mL 3.380    No results found for: "LDH"  STUDIES:  EXAM:06/09/22 KSMS IMPRESSION: Significant interval enlargement of a thinly septated cystic lesion of the central pancreatic head, now measuring 3.8 x 3.0 cm, previously 2.5 x 2.0 cm. This appears to communicate with the adjacent main pancreatic duct, and as previously reported, this has been observed to grow very slowly over a long period of time on examinations dating back to at least 2015. 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 Given substantial interval growth and size and greater than 2.5 cm, recommend EUS/FNA for definitive diagnosis. This recommendation follows ACR white paper guidelines for the management of incidental pancreatic cystic lesions. Status post cholecystectomy.    HISTORY:   Past Medical History:  Diagnosis Date   Acid reflux    Anxiety    Carpal tunnel syndrome    Cerebrovascular disease    Diabetic retinopathy (HCC)    Family history of pancreatic cancer 08/09/2021   Gait abnormality    History of stroke    Hyperlipidemia    Hypertension    Migraine    Pancreatic cyst 08/09/2021   Retinal detachment    Thrombocytosis    Trigger finger    Ulnar nerve entrapment     Past Surgical History:  Procedure Laterality  Date   CESAREAN SECTION  1997   GALLBLADDER SURGERY  2011    Family History  Problem Relation Age of Onset   Diabetes Mother    Cancer Mother    Cancer Father    Healthy Child      Social History:  reports that she has never smoked. She has never used smokeless tobacco. She reports that she does not drink  alcohol and does not use drugs.The patient is alone today.  Allergies:  Allergies  Allergen Reactions   Iodinated Contrast Media Other (See Comments) and Shortness Of Breath   Morphine Itching and Other (See Comments)    "Makes me feel out of my mind" per pt.    Current Medications: Current Outpatient Medications  Medication Sig Dispense Refill   atorvastatin (LIPITOR) 40 MG tablet Take 1 tablet (40 mg total) by mouth at bedtime. 90 tablet 1   ALPRAZolam (XANAX) 0.5 MG tablet Take 1 tablet (0.5 mg total) by mouth every 8 (eight) hours as needed for anxiety. 90 tablet 2   amLODipine (NORVASC) 10 MG tablet Take 10 mg by mouth daily.     aspirin 81 MG chewable tablet Chew 1 tablet by mouth daily.     carvedilol (COREG) 12.5 MG tablet Take 12.5 mg by mouth 2 (two) times daily.     clopidogrel (PLAVIX) 75 MG tablet Take 1 tablet (75 mg total) by mouth daily. 90 tablet 3   diclofenac Sodium (VOLTAREN) 1 % GEL Apply 4 g topically 4 (four) times daily.     doxycycline (MONODOX) 100 MG capsule Take 1 capsule (100 mg total) by mouth 2 (two) times daily. 20 capsule 0   enalapril (VASOTEC) 20 MG tablet Take 20 mg by mouth 2 (two) times daily.     fluticasone (FLONASE) 50 MCG/ACT nasal spray 1 spray as needed.     fluticasone (FLONASE) 50 MCG/ACT nasal spray Place 1 spray into both nostrils in the morning and at bedtime. 15.8 mL 0   fluticasone (FLOVENT HFA) 44 MCG/ACT inhaler      glimepiride (AMARYL) 2 MG tablet Take by mouth.     hydrochlorothiazide (HYDRODIURIL) 12.5 MG tablet Take 12.5 mg by mouth daily.     hydroxyurea (HYDREA) 500 MG capsule Take 500 mg by mouth 2 (two) times daily. May take with food to minimize GI side effects.     ibuprofen (ADVIL,MOTRIN) 800 MG tablet Take 800 mg by mouth every 8 (eight) hours as needed.     Insulin Detemir (LEVEMIR) 100 UNIT/ML Pen Inject into the skin.     Insulin Pen Needle (BD PEN NEEDLE NANO 2ND GEN) 32G X 4 MM MISC Check blood sugar three times a  day 100 each 3   lidocaine (XYLOCAINE) 1 % (with preservative) injection by Infiltration route.     metFORMIN (GLUCOPHAGE) 1000 MG tablet Take 1,000 mg by mouth 2 (two) times daily with a meal.     metoCLOPramide (REGLAN) 5 MG tablet Take 5 mg by mouth every 8 (eight) hours.     NOVOLOG FLEXPEN 100 UNIT/ML FlexPen SMARTSIG:34 Unit(s) SUB-Q 3 Times Daily 15 mL 3   ondansetron (ZOFRAN) 4 MG tablet Take 1 tablet (4 mg total) by mouth every 8 (eight) hours as needed for nausea or vomiting. 20 tablet 0   Oxycodone HCl 10 MG TABS Take 1 tablet (10 mg total) by mouth every 4 (four) hours as needed. 120 tablet 0   pantoprazole (PROTONIX) 40 MG tablet Take 1 tablet (40 mg  total) by mouth daily. 30 tablet 6   Potassium Chloride ER 20 MEQ TBCR Take 1 tablet by mouth daily.     prochlorperazine (COMPAZINE) 10 MG tablet Take 10 mg by mouth every 6 (six) hours as needed for nausea or vomiting.     spironolactone (ALDACTONE) 25 MG tablet Take 0.5 tablets by mouth daily.     TOUJEO MAX SOLOSTAR 300 UNIT/ML Solostar Pen SMARTSIG:80 Unit(s) SUB-Q Every Night 300 mL 1   triamcinolone acetonide (TRIESENCE) 40 MG/ML SUSP Inject into the articular space.     TRULICITY 4.5 MG/0.5ML SOPN SMARTSIG:1 Pre-Filled Pen Syringe SUB-Q Once a Week     No current facility-administered medications for this visit.     I,Jasmine M Lassiter,acting as a scribe for Dellia Beckwith, MD.,have documented all relevant documentation on the behalf of Dellia Beckwith, MD,as directed by  Dellia Beckwith, MD while in the presence of Dellia Beckwith, MD.

## 2022-11-16 ENCOUNTER — Other Ambulatory Visit: Payer: Self-pay | Admitting: Oncology

## 2022-11-16 ENCOUNTER — Inpatient Hospital Stay: Payer: Medicare Other | Attending: Hematology and Oncology | Admitting: Oncology

## 2022-11-16 ENCOUNTER — Inpatient Hospital Stay: Payer: Medicare Other

## 2022-11-16 ENCOUNTER — Encounter: Payer: Self-pay | Admitting: Oncology

## 2022-11-16 VITALS — BP 193/88 | HR 79 | Temp 98.5°F | Resp 17 | Ht 64.0 in | Wt 145.3 lb

## 2022-11-16 DIAGNOSIS — D72819 Decreased white blood cell count, unspecified: Secondary | ICD-10-CM | POA: Insufficient documentation

## 2022-11-16 DIAGNOSIS — K862 Cyst of pancreas: Secondary | ICD-10-CM | POA: Insufficient documentation

## 2022-11-16 DIAGNOSIS — D630 Anemia in neoplastic disease: Secondary | ICD-10-CM

## 2022-11-16 DIAGNOSIS — Z8719 Personal history of other diseases of the digestive system: Secondary | ICD-10-CM | POA: Diagnosis not present

## 2022-11-16 DIAGNOSIS — R1012 Left upper quadrant pain: Secondary | ICD-10-CM | POA: Diagnosis not present

## 2022-11-16 DIAGNOSIS — M85859 Other specified disorders of bone density and structure, unspecified thigh: Secondary | ICD-10-CM | POA: Diagnosis not present

## 2022-11-16 DIAGNOSIS — D649 Anemia, unspecified: Secondary | ICD-10-CM | POA: Insufficient documentation

## 2022-11-16 DIAGNOSIS — C7B8 Other secondary neuroendocrine tumors: Secondary | ICD-10-CM

## 2022-11-16 DIAGNOSIS — D473 Essential (hemorrhagic) thrombocythemia: Secondary | ICD-10-CM

## 2022-11-16 DIAGNOSIS — Z800 Family history of malignant neoplasm of digestive organs: Secondary | ICD-10-CM | POA: Diagnosis not present

## 2022-11-16 DIAGNOSIS — K648 Other hemorrhoids: Secondary | ICD-10-CM | POA: Insufficient documentation

## 2022-11-16 DIAGNOSIS — Z79891 Long term (current) use of opiate analgesic: Secondary | ICD-10-CM | POA: Insufficient documentation

## 2022-11-16 DIAGNOSIS — G894 Chronic pain syndrome: Secondary | ICD-10-CM | POA: Insufficient documentation

## 2022-11-16 DIAGNOSIS — R933 Abnormal findings on diagnostic imaging of other parts of digestive tract: Secondary | ICD-10-CM | POA: Diagnosis not present

## 2022-11-16 DIAGNOSIS — R978 Other abnormal tumor markers: Secondary | ICD-10-CM

## 2022-11-16 DIAGNOSIS — R161 Splenomegaly, not elsewhere classified: Secondary | ICD-10-CM | POA: Diagnosis not present

## 2022-11-16 DIAGNOSIS — Z86010 Personal history of colonic polyps: Secondary | ICD-10-CM | POA: Diagnosis not present

## 2022-11-16 DIAGNOSIS — R1900 Intra-abdominal and pelvic swelling, mass and lump, unspecified site: Secondary | ICD-10-CM

## 2022-11-16 LAB — CBC WITH DIFFERENTIAL (CANCER CENTER ONLY)
Abs Immature Granulocytes: 0.01 10*3/uL (ref 0.00–0.07)
Basophils Absolute: 0 10*3/uL (ref 0.0–0.1)
Basophils Relative: 0 %
Eosinophils Absolute: 0 10*3/uL (ref 0.0–0.5)
Eosinophils Relative: 1 %
HCT: 32.5 % — ABNORMAL LOW (ref 36.0–46.0)
Hemoglobin: 10.5 g/dL — ABNORMAL LOW (ref 12.0–15.0)
Immature Granulocytes: 0 %
Lymphocytes Relative: 25 %
Lymphs Abs: 0.8 10*3/uL (ref 0.7–4.0)
MCH: 31.2 pg (ref 26.0–34.0)
MCHC: 32.3 g/dL (ref 30.0–36.0)
MCV: 96.4 fL (ref 80.0–100.0)
Monocytes Absolute: 0.2 10*3/uL (ref 0.1–1.0)
Monocytes Relative: 6 %
Neutro Abs: 2.1 10*3/uL (ref 1.7–7.7)
Neutrophils Relative %: 68 %
Platelet Count: 275 10*3/uL (ref 150–400)
RBC: 3.37 MIL/uL — ABNORMAL LOW (ref 3.87–5.11)
RDW: 16.1 % — ABNORMAL HIGH (ref 11.5–15.5)
WBC Count: 3.2 10*3/uL — ABNORMAL LOW (ref 4.0–10.5)
nRBC: 0 % (ref 0.0–0.2)

## 2022-11-16 LAB — CMP (CANCER CENTER ONLY)
ALT: 20 U/L (ref 0–44)
AST: 21 U/L (ref 15–41)
Albumin: 4.1 g/dL (ref 3.5–5.0)
Alkaline Phosphatase: 74 U/L (ref 38–126)
Anion gap: 12 (ref 5–15)
BUN: 12 mg/dL (ref 6–20)
CO2: 25 mmol/L (ref 22–32)
Calcium: 9.1 mg/dL (ref 8.9–10.3)
Chloride: 105 mmol/L (ref 98–111)
Creatinine: 0.86 mg/dL (ref 0.44–1.00)
GFR, Estimated: >60 mL/min (ref >60)
Glucose, Bld: 169 mg/dL — ABNORMAL HIGH (ref 70–99)
Potassium: 3.3 mmol/L — ABNORMAL LOW (ref 3.5–5.1)
Sodium: 142 mmol/L (ref 135–145)
Total Bilirubin: 0.6 mg/dL (ref 0.3–1.2)
Total Protein: 7.4 g/dL (ref 6.5–8.1)

## 2022-11-16 MED ORDER — OXYCODONE HCL 10 MG PO TABS
10.0000 mg | ORAL_TABLET | ORAL | 0 refills | Status: DC | PRN
Start: 2022-11-16 — End: 2022-12-20

## 2022-11-16 NOTE — Progress Notes (Signed)
3

## 2022-11-17 ENCOUNTER — Telehealth: Payer: Self-pay | Admitting: Oncology

## 2022-11-17 NOTE — Telephone Encounter (Signed)
11/17/22 Spoke with patient and scheduled MRI ABD on 11/18/22@130pm 

## 2022-11-18 LAB — CEA: CEA: 1.9 ng/mL (ref 0.0–4.7)

## 2022-11-18 LAB — CANCER ANTIGEN 19-9: CA 19-9: 51 U/mL — ABNORMAL HIGH (ref 0–35)

## 2022-11-21 ENCOUNTER — Encounter: Payer: Self-pay | Admitting: Internal Medicine

## 2022-11-21 ENCOUNTER — Ambulatory Visit: Payer: Medicare Other | Admitting: Internal Medicine

## 2022-11-21 VITALS — BP 142/80 | HR 78 | Temp 97.5°F | Resp 16 | Ht 64.0 in | Wt 139.0 lb

## 2022-11-21 DIAGNOSIS — R531 Weakness: Secondary | ICD-10-CM | POA: Diagnosis not present

## 2022-11-21 DIAGNOSIS — J40 Bronchitis, not specified as acute or chronic: Secondary | ICD-10-CM | POA: Insufficient documentation

## 2022-11-21 MED ORDER — DOXYCYCLINE MONOHYDRATE 100 MG PO CAPS: 100.0000 mg | ORAL_CAPSULE | Freq: Two times a day (BID) | ORAL | 0 refills | Status: DC

## 2022-11-21 MED ORDER — FLUTICASONE PROPIONATE 50 MCG/ACT NA SUSP: 1.0000 | Freq: Two times a day (BID) | NASAL | 0 refills | Status: DC

## 2022-11-21 MED ORDER — ONDANSETRON HCL 4 MG PO TABS: 4.0000 mg | ORAL_TABLET | Freq: Three times a day (TID) | ORAL | 0 refills | Status: DC | PRN

## 2022-11-21 NOTE — Assessment & Plan Note (Signed)
Her lungs are clear on exam.  We will start her on doxycycline for her cough and flonase for her nasal congestion and runny nose.

## 2022-11-21 NOTE — Assessment & Plan Note (Signed)
She has generalized weakness probably from not eating or drinking well due to her nausea over the last several days.  I will check her CBC and CMP (last week's was reviewed) and start her on zofran prn.

## 2022-11-21 NOTE — Progress Notes (Signed)
Office Visit  Subjective   Patient ID: Allison Stevens   DOB: 16-Mar-1966   Age: 57 y.o.   MRN: [id number]   Chief Complaint Chief Complaint  Patient presents with   Acute Visit    Trouble walking     History of Present Illness The patient is a 57 year old female who presents with upper respiratory tract symptoms which began 3-4 days ago. She complains of productive cough productive of yellow sputum as well sinus congestion with clear nasal discharge and post nasal drip. She also states she is wheezing a bit.  She has tested herself 3 times at home with a home COVID-19 test kit which have been negative.  She saw Dr. Melida Quitter last week before she became sick and told her that she didn't feel well and that her walking had worsened since 11/01/2022.  Today, she states whenever she eats, she gets nausea and vomiting.  She states she has eaten anything and has not been drinking well for 7 days.  She states she has generalized weakness but no focal weakness and that is what his effecting her walking.   She denies fevers, chills, sore throat, body aches, headache, shortness of breath, diarrhea, or other problems. Alleviating factors: she has not taken anything OTC.  The patient's past medical history is noncontributory.   She saw Dr. Melida Quitter last week where she is working up a pancreatic lesion.  The patient will see Dr. Jennye Boroughs on 11/29/2022 for a colon/EGD. She never went for the appointment that we referred for a EUS and now it has been many months since her MRI scan of the abdomen, which revealed enlargement of the pancreatic lesion.     Past Medical History Past Medical History:  Diagnosis Date   Acid reflux    Anxiety    Carpal tunnel syndrome    Cerebrovascular disease    Diabetic retinopathy    Family history of pancreatic cancer 08/09/2021   Gait abnormality    History of stroke    Hyperlipidemia    Hypertension    Migraine    Pancreatic cyst 08/09/2021   Retinal  detachment    Thrombocytosis    Trigger finger    Ulnar nerve entrapment      Allergies Allergies  Allergen Reactions   Iodinated Contrast Media Other (See Comments) and Shortness Of Breath   Morphine Itching and Other (See Comments)    "Makes me feel out of my mind" per pt.     Medications  Current Outpatient Medications:    atorvastatin (LIPITOR) 40 MG tablet, Take 1 tablet (40 mg total) by mouth at bedtime., Disp: 90 tablet, Rfl: 1   ALPRAZolam (XANAX) 0.5 MG tablet, Take 1 tablet (0.5 mg total) by mouth every 8 (eight) hours as needed for anxiety., Disp: 90 tablet, Rfl: 2   amLODipine (NORVASC) 10 MG tablet, Take 10 mg by mouth daily., Disp: , Rfl:    aspirin 81 MG chewable tablet, Chew 1 tablet by mouth daily., Disp: , Rfl:    carvedilol (COREG) 12.5 MG tablet, Take 12.5 mg by mouth 2 (two) times daily., Disp: , Rfl:    clopidogrel (PLAVIX) 75 MG tablet, Take 1 tablet (75 mg total) by mouth daily., Disp: 90 tablet, Rfl: 3   diclofenac Sodium (VOLTAREN) 1 % GEL, Apply 4 g topically 4 (four) times daily., Disp: , Rfl:    enalapril (VASOTEC) 20 MG tablet, Take 20 mg by mouth 2 (two) times daily., Disp: , Rfl:  fluticasone (FLONASE) 50 MCG/ACT nasal spray, 1 spray as needed., Disp: , Rfl:    fluticasone (FLOVENT HFA) 44 MCG/ACT inhaler, , Disp: , Rfl:    glimepiride (AMARYL) 2 MG tablet, Take by mouth., Disp: , Rfl:    hydrochlorothiazide (HYDRODIURIL) 12.5 MG tablet, Take 12.5 mg by mouth daily., Disp: , Rfl:    hydroxyurea (HYDREA) 500 MG capsule, Take 500 mg by mouth 2 (two) times daily. May take with food to minimize GI side effects., Disp: , Rfl:    ibuprofen (ADVIL,MOTRIN) 800 MG tablet, Take 800 mg by mouth every 8 (eight) hours as needed., Disp: , Rfl:    Insulin Detemir (LEVEMIR) 100 UNIT/ML Pen, Inject into the skin., Disp: , Rfl:    Insulin Pen Needle (BD PEN NEEDLE NANO 2ND GEN) 32G X 4 MM MISC, USE AS DIRECTED, Disp: 100 each, Rfl: 3   lidocaine (XYLOCAINE) 1 % (with  preservative) injection, by Infiltration route., Disp: , Rfl:    metFORMIN (GLUCOPHAGE) 1000 MG tablet, Take 1,000 mg by mouth 2 (two) times daily with a meal., Disp: , Rfl:    metoCLOPramide (REGLAN) 5 MG tablet, Take 5 mg by mouth every 8 (eight) hours., Disp: , Rfl:    NOVOLOG FLEXPEN 100 UNIT/ML FlexPen, SMARTSIG:34 Unit(s) SUB-Q 3 Times Daily, Disp: 15 mL, Rfl: 3   Oxycodone HCl 10 MG TABS, Take 1 tablet (10 mg total) by mouth every 4 (four) hours as needed., Disp: 120 tablet, Rfl: 0   pantoprazole (PROTONIX) 40 MG tablet, Take 1 tablet (40 mg total) by mouth daily., Disp: 30 tablet, Rfl: 6   Potassium Chloride ER 20 MEQ TBCR, Take 1 tablet by mouth daily., Disp: , Rfl:    prochlorperazine (COMPAZINE) 10 MG tablet, Take 10 mg by mouth every 6 (six) hours as needed for nausea or vomiting., Disp: , Rfl:    spironolactone (ALDACTONE) 25 MG tablet, Take 0.5 tablets by mouth daily., Disp: , Rfl:    TOUJEO MAX SOLOSTAR 300 UNIT/ML Solostar Pen, SMARTSIG:80 Unit(s) SUB-Q Every Night, Disp: 300 mL, Rfl: 1   triamcinolone acetonide (TRIESENCE) 40 MG/ML SUSP, Inject into the articular space., Disp: , Rfl:    TRULICITY 4.5 MG/0.5ML SOPN, SMARTSIG:1 Pre-Filled Pen Syringe SUB-Q Once a Week, Disp: , Rfl:    Review of Systems Review of Systems  Constitutional:  Positive for malaise/fatigue. Negative for chills and fever.  Respiratory:  Positive for cough, sputum production and wheezing. Negative for shortness of breath.   Cardiovascular:  Negative for chest pain, palpitations and leg swelling.  Gastrointestinal:  Positive for nausea and vomiting. Negative for abdominal pain, constipation and diarrhea.  Musculoskeletal:  Negative for myalgias.  Skin:  Negative for itching and rash.  Neurological:  Positive for focal weakness. Negative for dizziness, weakness and headaches.       Objective:    Vitals BP (!) 142/80   Pulse 78   Temp (!) 97.5 F (36.4 C)   Resp 16   Ht 5\' 4"  (1.626 m)   Wt 139  lb (63 kg)   SpO2 98%   BMI 23.86 kg/m    Physical Examination Physical Exam Constitutional:      Appearance: Normal appearance. She is not ill-appearing.  Cardiovascular:     Rate and Rhythm: Normal rate and regular rhythm.     Pulses: Normal pulses.     Heart sounds: No murmur heard.    No friction rub. No gallop.  Pulmonary:     Effort: Pulmonary effort is normal.  No respiratory distress.     Breath sounds: No wheezing, rhonchi or rales.  Abdominal:     General: Bowel sounds are normal. There is no distension.     Palpations: Abdomen is soft.     Tenderness: There is no abdominal tenderness.  Musculoskeletal:     Right lower leg: No edema.     Left lower leg: No edema.  Skin:    General: Skin is warm and dry.     Findings: No rash.  Neurological:     Mental Status: She is alert.     Comments: CN II-XII are fully intact, strength is 5/5 throughout except left hand grip strength is 4+/5.        Assessment & Plan:   Bronchitis Her lungs are clear on exam.  We will start her on doxycycline for her cough and flonase for her nasal congestion and runny nose.  Generalized weakness She has generalized weakness probably from not eating or drinking well due to her nausea over the last several days.  I will check her CBC and CMP (last week's was reviewed) and start her on zofran prn.    No follow-ups on file.   Crist Fat, MD

## 2022-11-22 ENCOUNTER — Other Ambulatory Visit: Payer: Self-pay

## 2022-11-22 ENCOUNTER — Telehealth: Payer: Self-pay | Admitting: Dietician

## 2022-11-22 LAB — CBC WITH DIFFERENTIAL/PLATELET
Basophils Absolute: 0 10*3/uL (ref 0.0–0.2)
Basos: 0 %
EOS (ABSOLUTE): 0.1 10*3/uL (ref 0.0–0.4)
Eos: 2 %
Hematocrit: 32.5 % — ABNORMAL LOW (ref 34.0–46.6)
Hemoglobin: 11.4 g/dL (ref 11.1–15.9)
Immature Grans (Abs): 0 10*3/uL (ref 0.0–0.1)
Immature Granulocytes: 0 %
Lymphocytes Absolute: 0.9 10*3/uL (ref 0.7–3.1)
Lymphs: 21 %
MCH: 31.7 pg (ref 26.6–33.0)
MCHC: 35.1 g/dL (ref 31.5–35.7)
MCV: 90 fL (ref 79–97)
Monocytes Absolute: 0.2 10*3/uL (ref 0.1–0.9)
Monocytes: 6 %
Neutrophils Absolute: 2.8 10*3/uL (ref 1.4–7.0)
Neutrophils: 71 %
Platelets: 272 10*3/uL (ref 150–450)
RBC: 3.6 x10E6/uL — ABNORMAL LOW (ref 3.77–5.28)
RDW: 14.8 % (ref 11.7–15.4)
WBC: 4 10*3/uL (ref 3.4–10.8)

## 2022-11-22 LAB — CMP14 + ANION GAP
ALT: 28 IU/L (ref 0–32)
AST: 24 IU/L (ref 0–40)
Albumin/Globulin Ratio: 1.6 (ref 1.2–2.2)
Albumin: 4.4 g/dL (ref 3.8–4.9)
Alkaline Phosphatase: 102 IU/L (ref 44–121)
Anion Gap: 20 mmol/L — ABNORMAL HIGH (ref 10.0–18.0)
BUN/Creatinine Ratio: 22 (ref 9–23)
BUN: 23 mg/dL (ref 6–24)
Bilirubin Total: 0.8 mg/dL (ref 0.0–1.2)
CO2: 24 mmol/L (ref 20–29)
Calcium: 9.7 mg/dL (ref 8.7–10.2)
Chloride: 98 mmol/L (ref 96–106)
Creatinine, Ser: 1.03 mg/dL — ABNORMAL HIGH (ref 0.57–1.00)
Globulin, Total: 2.8 g/dL (ref 1.5–4.5)
Glucose: 330 mg/dL — ABNORMAL HIGH (ref 70–99)
Potassium: 3.9 mmol/L (ref 3.5–5.2)
Sodium: 142 mmol/L (ref 134–144)
Total Protein: 7.2 g/dL (ref 6.0–8.5)
eGFR: 63 mL/min/{1.73_m2} (ref >59)

## 2022-11-22 MED ORDER — BD PEN NEEDLE NANO 2ND GEN 32G X 4 MM MISC: 3 refills | Status: DC

## 2022-11-22 NOTE — Telephone Encounter (Signed)
Patient screened on MST. First attempt to reach. She picked up but said she was too sick to speak with me.  Advised to call her PCP. She agreed to schedule a telephone consult for 11/24/22.  Provided my cell# in a text message if she needed to reschedule the nutrition consult.  Gennaro Africa, RDN, LDN Registered Dietitian, [facility] Part Time Remote (Usual office hours: Tuesday-Thursday) Cell: [phone]

## 2022-11-24 ENCOUNTER — Telehealth: Payer: Self-pay | Admitting: Dietician

## 2022-11-24 ENCOUNTER — Inpatient Hospital Stay: Payer: Medicare Other | Admitting: Dietician

## 2022-11-24 NOTE — Telephone Encounter (Signed)
Attempted to reach patient for a scheduled remote nutrition consult. She picked up phone said she was still in bed.  She states she's not feeling well, but has been taking the medicine and checking her blood sugars.  She said they were about 100 but she hasn't checked yet today.  I tried to encouraged her to continue to attempt to eat in and drink to fell stronger.  I told her I would call later this afternoon for her nutrition consult.  Gennaro Africa, RDN, LDN Registered Dietitian, [facility] Part Time Remote (Usual office hours: Tuesday-Thursday) Cell: [phone]

## 2022-11-24 NOTE — Telephone Encounter (Signed)
Attempted to reach patient for a scheduled remote nutrition consult later in day as she wasn't feeling well or awake earlier.  She didn't pick up on home telephone#. Provided my cell# in a text to her mobile to reschedule her nutrition consult.  Gennaro Africa, RDN, LDN Registered Dietitian, [facility] Part Time Remote (Usual office hours: Tuesday-Thursday) Cell: [phone]

## 2022-11-28 ENCOUNTER — Telehealth: Payer: Self-pay

## 2022-11-28 NOTE — Telephone Encounter (Signed)
-----   Message from Dellia Beckwith, MD sent at 11/23/2022  6:35 PM EDT ----- Regarding: call Tell her blood counts are stable but her red cells are low so will be tired.  Her K is low, try to increase in her diet and we will see how it looks next month.  The rest is normal except for BS 169, not bad

## 2022-11-28 NOTE — Telephone Encounter (Signed)
Called patient and notified her of the results.  

## 2022-11-29 ENCOUNTER — Telehealth: Payer: Self-pay

## 2022-11-29 DIAGNOSIS — E119 Type 2 diabetes mellitus without complications: Secondary | ICD-10-CM | POA: Diagnosis not present

## 2022-11-29 DIAGNOSIS — Z79899 Other long term (current) drug therapy: Secondary | ICD-10-CM | POA: Diagnosis not present

## 2022-11-29 DIAGNOSIS — I10 Essential (primary) hypertension: Secondary | ICD-10-CM | POA: Diagnosis not present

## 2022-11-29 DIAGNOSIS — Z8673 Personal history of transient ischemic attack (TIA), and cerebral infarction without residual deficits: Secondary | ICD-10-CM | POA: Diagnosis not present

## 2022-11-29 DIAGNOSIS — K219 Gastro-esophageal reflux disease without esophagitis: Secondary | ICD-10-CM | POA: Diagnosis not present

## 2022-11-29 DIAGNOSIS — R1013 Epigastric pain: Secondary | ICD-10-CM | POA: Diagnosis not present

## 2022-11-29 DIAGNOSIS — K222 Esophageal obstruction: Secondary | ICD-10-CM | POA: Diagnosis not present

## 2022-11-29 DIAGNOSIS — K449 Diaphragmatic hernia without obstruction or gangrene: Secondary | ICD-10-CM | POA: Diagnosis not present

## 2022-11-29 DIAGNOSIS — Z1211 Encounter for screening for malignant neoplasm of colon: Secondary | ICD-10-CM | POA: Diagnosis not present

## 2022-11-29 NOTE — Telephone Encounter (Signed)
Per Octavio Graves, Dr. Braulio Conte also told her that he could not do the biopsy during her colonoscopy that she would need to go to [facility] or [place].  She would like [place] to referr her to the one you prefer.

## 2022-11-30 NOTE — Telephone Encounter (Signed)
Ultimately, it was the patient's decision to not have a procedure.  I have read through the notes and I am okay with this patient coming in for an EUS, if she desires.  If she defers on another procedure or clinic visit, then we will let the referring office know and she will need to be referred elsewhere for any future referrals.  Patty, you may offer the patient as we had previously outlined and if she declines she declines. Thanks. GM

## 2022-11-30 NOTE — Telephone Encounter (Signed)
Good Afternoon Dr.Mansouraty,  We received another referral for this patient to have an EUS. Records placed on your desk for review.  Thank you.

## 2022-12-01 ENCOUNTER — Telehealth: Payer: Self-pay

## 2022-12-01 NOTE — Telephone Encounter (Signed)
-----   Message from Crist Fat, MD sent at 11/24/2022  8:32 AM EDT ----- Her labs showed she was a bit dehydrated.

## 2022-12-01 NOTE — Telephone Encounter (Signed)
Pt notified of lab results

## 2022-12-02 ENCOUNTER — Other Ambulatory Visit: Payer: Self-pay

## 2022-12-02 DIAGNOSIS — K862 Cyst of pancreas: Secondary | ICD-10-CM

## 2022-12-02 NOTE — Telephone Encounter (Signed)
Attempted to reach the pt again and line rings no answer no voice mail

## 2022-12-02 NOTE — Telephone Encounter (Signed)
EUS has been scheduled for 02/09/23 at 1115 am at [facility] with GM   Left message on machine to call back

## 2022-12-05 NOTE — Telephone Encounter (Signed)
Attempted to reach pt x3 no response and no voice mail answered  All information has been mailed to the pt.

## 2022-12-07 ENCOUNTER — Other Ambulatory Visit: Payer: Self-pay | Admitting: Internal Medicine

## 2022-12-08 ENCOUNTER — Ambulatory Visit: Payer: Medicare Other | Admitting: Internal Medicine

## 2022-12-08 ENCOUNTER — Encounter: Payer: Self-pay | Admitting: Internal Medicine

## 2022-12-08 VITALS — BP 168/78 | HR 70 | Temp 97.8°F | Resp 16 | Ht 64.0 in | Wt 140.6 lb

## 2022-12-08 DIAGNOSIS — I6381 Other cerebral infarction due to occlusion or stenosis of small artery: Secondary | ICD-10-CM | POA: Diagnosis not present

## 2022-12-08 DIAGNOSIS — R531 Weakness: Secondary | ICD-10-CM | POA: Diagnosis not present

## 2022-12-08 DIAGNOSIS — R29898 Other symptoms and signs involving the musculoskeletal system: Secondary | ICD-10-CM | POA: Diagnosis not present

## 2022-12-08 NOTE — Progress Notes (Signed)
Office Visit  Subjective   Patient ID: Allison Stevens   DOB: October 17, 1965   Age: 57 y.o.   MRN: [id number]   Chief Complaint Chief Complaint  Patient presents with   Acute Visit    Trouble walking     History of Present Illness Allison Stevens is a 57 yo female who comes in today for an acute visit for trouble with walking which started 2-3 weeks ago.  She  has had had multiple strokes related to her thrombocytosis where she has residual sequalae of left sided weakness but was able to use that side.  She has not been following with Neurology.She states that she began having worsening weakness of her left side of both her arm and her leg that started 2-3 weeks ago.  There is no weakness of her right side.  She actually had about 4 falls with abrasions to her knees.  She states she is not sure if she another stroke.  The patient denies any dizziness but does complain of this worsening left sided weakness where she has to hold on to things to walk and she is now using a rolling walker.  She does follow with Hematology with Dr. Gilman Buttner where she last saw her in 10/2022 where her thrombocytosis was controlled and she was noted to have a platelet count on 11/21/2022 of 272.  She is currently treated with hydroxyruea.  The patient was diagnosed with thrombocytosis in 2005.  This new weakness is not effecting her face, speech, vision or memory.     Past Medical History Past Medical History:  Diagnosis Date   Acid reflux    Anxiety    Carpal tunnel syndrome    Cerebrovascular disease    Diabetic retinopathy (HCC)    Family history of pancreatic cancer 08/09/2021   Gait abnormality    History of stroke    Hyperlipidemia    Hypertension    Migraine    Pancreatic cyst 08/09/2021   Retinal detachment    Thrombocytosis    Trigger finger    Ulnar nerve entrapment      Allergies Allergies  Allergen Reactions   Iodinated Contrast Media Other (See Comments) and Shortness Of Breath    Morphine Itching and Other (See Comments)    "Makes me feel out of my mind" per pt.     Medications  Current Outpatient Medications:    atorvastatin (LIPITOR) 40 MG tablet, Take 1 tablet (40 mg total) by mouth at bedtime., Disp: 90 tablet, Rfl: 1   ALPRAZolam (XANAX) 0.5 MG tablet, Take 1 tablet (0.5 mg total) by mouth every 8 (eight) hours as needed for anxiety., Disp: 90 tablet, Rfl: 2   amLODipine (NORVASC) 10 MG tablet, Take 10 mg by mouth daily., Disp: , Rfl:    aspirin 81 MG chewable tablet, Chew 1 tablet by mouth daily., Disp: , Rfl:    carvedilol (COREG) 12.5 MG tablet, Take 12.5 mg by mouth 2 (two) times daily., Disp: , Rfl:    clopidogrel (PLAVIX) 75 MG tablet, Take 1 tablet (75 mg total) by mouth daily., Disp: 90 tablet, Rfl: 3   diclofenac Sodium (VOLTAREN) 1 % GEL, Apply 4 g topically 4 (four) times daily., Disp: , Rfl:    doxycycline (MONODOX) 100 MG capsule, Take 1 capsule (100 mg total) by mouth 2 (two) times daily., Disp: 20 capsule, Rfl: 0   enalapril (VASOTEC) 20 MG tablet, Take 20 mg by mouth 2 (two) times daily., Disp: , Rfl:  fluticasone (FLONASE) 50 MCG/ACT nasal spray, 1 spray as needed., Disp: , Rfl:    fluticasone (FLONASE) 50 MCG/ACT nasal spray, PLACE 1 SPRAY INTO BOTH NOSTRILS IN THE MORNING AND AT BEDTIME., Disp: 16 mL, Rfl: 1   fluticasone (FLOVENT HFA) 44 MCG/ACT inhaler, , Disp: , Rfl:    glimepiride (AMARYL) 2 MG tablet, Take by mouth., Disp: , Rfl:    hydrochlorothiazide (HYDRODIURIL) 12.5 MG tablet, Take 12.5 mg by mouth daily., Disp: , Rfl:    hydroxyurea (HYDREA) 500 MG capsule, Take 500 mg by mouth 3 (three) times daily. May take with food to minimize GI side effects., Disp: , Rfl:    ibuprofen (ADVIL,MOTRIN) 800 MG tablet, Take 800 mg by mouth every 8 (eight) hours as needed., Disp: , Rfl:    Insulin Detemir (LEVEMIR) 100 UNIT/ML Pen, Inject into the skin., Disp: , Rfl:    Insulin Pen Needle (BD PEN NEEDLE NANO 2ND GEN) 32G X 4 MM MISC, Check blood  sugar three times a day, Disp: 100 each, Rfl: 3   lidocaine (XYLOCAINE) 1 % (with preservative) injection, by Infiltration route., Disp: , Rfl:    metFORMIN (GLUCOPHAGE) 1000 MG tablet, Take 1,000 mg by mouth 2 (two) times daily with a meal., Disp: , Rfl:    metoCLOPramide (REGLAN) 5 MG tablet, Take 5 mg by mouth every 8 (eight) hours., Disp: , Rfl:    NOVOLOG FLEXPEN 100 UNIT/ML FlexPen, INJECT 34 UNIT(S) INTO THE SKIN 3 TIMES DAILY BEFORE MEALS, Disp: 15 mL, Rfl: 3   ondansetron (ZOFRAN) 4 MG tablet, Take 1 tablet (4 mg total) by mouth every 8 (eight) hours as needed for nausea or vomiting., Disp: 20 tablet, Rfl: 0   Oxycodone HCl 10 MG TABS, Take 1 tablet (10 mg total) by mouth every 4 (four) hours as needed., Disp: 120 tablet, Rfl: 0   pantoprazole (PROTONIX) 40 MG tablet, Take 1 tablet (40 mg total) by mouth daily., Disp: 30 tablet, Rfl: 6   Potassium Chloride ER 20 MEQ TBCR, Take 1 tablet by mouth daily., Disp: , Rfl:    prochlorperazine (COMPAZINE) 10 MG tablet, Take 10 mg by mouth every 6 (six) hours as needed for nausea or vomiting., Disp: , Rfl:    spironolactone (ALDACTONE) 25 MG tablet, Take 0.5 tablets by mouth daily., Disp: , Rfl:    TOUJEO MAX SOLOSTAR 300 UNIT/ML Solostar Pen, SMARTSIG:80 Unit(s) SUB-Q Every Night, Disp: 300 mL, Rfl: 1   triamcinolone acetonide (TRIESENCE) 40 MG/ML SUSP, Inject into the articular space., Disp: , Rfl:    TRULICITY 4.5 MG/0.5ML SOPN, SMARTSIG:1 Pre-Filled Pen Syringe SUB-Q Once a Week, Disp: , Rfl:    Review of Systems Review of Systems  Constitutional:  Negative for chills and fever.  Eyes:  Negative for blurred vision and double vision.  Respiratory:  Negative for cough and shortness of breath.   Cardiovascular:  Negative for chest pain, palpitations and leg swelling.  Gastrointestinal:  Negative for abdominal pain, constipation, diarrhea, nausea and vomiting.  Musculoskeletal:  Negative for myalgias.  Neurological:  Positive for focal weakness  and weakness. Negative for dizziness and headaches.       Objective:    Vitals BP (!) 168/78   Pulse 70   Temp 97.8 F (36.6 C)   Resp 16   Ht 5\' 4"  (1.626 m)   Wt 140 lb 9.6 oz (63.8 kg)   SpO2 99%   BMI 24.13 kg/m    Physical Examination Physical Exam Constitutional:  Appearance: Normal appearance. She is not ill-appearing.  Cardiovascular:     Rate and Rhythm: Normal rate and regular rhythm.     Pulses: Normal pulses.     Heart sounds: No murmur heard.    No friction rub. No gallop.  Pulmonary:     Effort: Pulmonary effort is normal. No respiratory distress.     Breath sounds: No wheezing, rhonchi or rales.  Abdominal:     General: Bowel sounds are normal. There is no distension.     Palpations: Abdomen is soft.     Tenderness: There is no abdominal tenderness.  Musculoskeletal:     Right lower leg: No edema.     Left lower leg: No edema.  Skin:    General: Skin is warm and dry.     Findings: No rash.  Neurological:     Mental Status: She is alert.     Comments: CN II-XII fully intact.  Her strength is 5/5 on the right and her strength is 4+/5 in the upper and lower left extremities.        Assessment & Plan:   Left-sided weakness She states she had a viral illness that started 2 weeks ago with cough and dehydration.  She states she did not eat well for about 14 days.  However, she states today she has more weakness on her left side.  I am going to obtain labs including a CBC with diff and a CMP.  We will get her set up to have a CT scan of her head ASAP.    No follow-ups on file.   Crist Fat, MD

## 2022-12-08 NOTE — Addendum Note (Signed)
Addended by: Crist Fat on: 12/08/2022 10:44 AM   Modules accepted: Orders

## 2022-12-08 NOTE — Assessment & Plan Note (Signed)
She states she had a viral illness that started 2 weeks ago with cough and dehydration.  She states she did not eat well for about 14 days.  However, she states today she has more weakness on her left side.  I am going to obtain labs including a CBC with diff and a CMP.  We will get her set up to have a CT scan of her head ASAP.

## 2022-12-09 LAB — CMP14 + ANION GAP
ALT: 24 IU/L (ref 0–32)
AST: 32 IU/L (ref 0–40)
Albumin/Globulin Ratio: 1.5 (ref 1.2–2.2)
Albumin: 3.8 g/dL (ref 3.8–4.9)
Alkaline Phosphatase: 90 IU/L (ref 44–121)
Anion Gap: 18 mmol/L (ref 10.0–18.0)
BUN/Creatinine Ratio: 9 (ref 9–23)
BUN: 8 mg/dL (ref 6–24)
Bilirubin Total: 0.4 mg/dL (ref 0.0–1.2)
CO2: 23 mmol/L (ref 20–29)
Calcium: 9.2 mg/dL (ref 8.7–10.2)
Chloride: 98 mmol/L (ref 96–106)
Creatinine, Ser: 0.88 mg/dL (ref 0.57–1.00)
Globulin, Total: 2.6 g/dL (ref 1.5–4.5)
Glucose: 218 mg/dL — ABNORMAL HIGH (ref 70–99)
Potassium: 3.8 mmol/L (ref 3.5–5.2)
Sodium: 139 mmol/L (ref 134–144)
Total Protein: 6.4 g/dL (ref 6.0–8.5)
eGFR: 76 mL/min/{1.73_m2} (ref >59)

## 2022-12-09 LAB — CBC WITH DIFFERENTIAL/PLATELET
Basophils Absolute: 0 10*3/uL (ref 0.0–0.2)
Basos: 0 %
EOS (ABSOLUTE): 0.1 10*3/uL (ref 0.0–0.4)
Eos: 1 %
Hematocrit: 28.7 % — ABNORMAL LOW (ref 34.0–46.6)
Hemoglobin: 9.9 g/dL — ABNORMAL LOW (ref 11.1–15.9)
Immature Grans (Abs): 0 10*3/uL (ref 0.0–0.1)
Immature Granulocytes: 1 %
Lymphocytes Absolute: 1.1 10*3/uL (ref 0.7–3.1)
Lymphs: 29 %
MCH: 31.6 pg (ref 26.6–33.0)
MCHC: 34.5 g/dL (ref 31.5–35.7)
MCV: 92 fL (ref 79–97)
Monocytes Absolute: 0.3 10*3/uL (ref 0.1–0.9)
Monocytes: 7 %
Neutrophils Absolute: 2.4 10*3/uL (ref 1.4–7.0)
Neutrophils: 62 %
Platelets: 297 10*3/uL (ref 150–450)
RBC: 3.13 x10E6/uL — ABNORMAL LOW (ref 3.77–5.28)
RDW: 14.5 % (ref 11.7–15.4)
WBC: 3.8 10*3/uL (ref 3.4–10.8)

## 2022-12-09 NOTE — Telephone Encounter (Signed)
Patient received paperwork in the mail, stated she would like to keep appointment and had no further questions.

## 2022-12-13 NOTE — Progress Notes (Addendum)
ADDENDUM: Her labs show a blood sugar of 251 which was nonfasting.  Her potassium was down to 2.9.  Her white count was 3.3 with an ANC of 2200 and the hemoglobin is down to 9.7 with a platelet count of 269,000.  I therefore instructed her to decrease her hydroxyurea to 1 pill daily and to stop the hydrochlorothiazide.  She admits she has run out of her potassium but I do not know for how long.  I will send in a prescription for 20 mEq 3 times daily and instructed her to take that until her next appointment.     [facility]  [address] [place],  [place]  [address] [phone]  Clinic Day: 12/15/2022  Referring physician: Crist Fat, MD  ASSESSMENT & PLAN:  Assessment & Plan: Essential thrombocytosis [facility]) Essential thrombocythemia, well controlled with hydroxyurea 500 mg, was decreased from 4 times daily to 3 times daily since she had worsening anemia and leukopenia, and then decreased to twice daily in April 2024. She is on chronic opioids due to bone pain. Today's labs are pending.  Anemia Her hemoglobin was stable at 10.3 earlier this year and has fluctuated up and down. The most recent was 9.9.    Family history of pancreatic cancer First-degree relative, brother, with pancreatic cancer.  He declined genetic testing.  Genetic testing has been recommended for the patient, but not done due to lack of insurance coverage. That brother has expired but now her other brother ha also been diagnosed with pancreatic cancer and was unresectable at [facility]. She tells me he has now been referred to hospice.   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 This has slowly increased since 2016.  Now it has increased significantly to 3.8 cm in maximum diameter and appears to communicate with the main pancreatic duct.  This is likely a slow-growing tumor but I am concerned in  view of her strong family history for pancreatic cancer, her other brother has now been referred to hospice because it was unresectable  at the time of surgery . She has missed 3 appointment for a MRI of the abdomen to follow-up on the pancreatic cyst. She has been referred to gastroenterology in [facility] for an EUS and it is scheduled by Dr. Meridee Stevens on July 11th. They may do a biopsy at the time. I stressed to her the importance of keeping this appointment.  Esophageal Stricture She has an esophageal stricture at the GE junction with erosions and had dilation on April 30th by Dr. Jennye Stevens .   Colon Polyps She last had colonoscopy in September 2017 and had a tubular adenoma removed from the sigmoid colon. He was unable to perform the colonoscopy due to poor prep. He will reschedule.   Plan:  She had a CT of the head performed on 12/08/2022 that showed no acute intracranial abnormalities, chronic microvascular disease, brain atrophy, and remote lacunar infarct within bilateral basal ganglia. She does has a MRI of the brain scheduled for May 21st by Dr. Leonia Stevens to evaluate new left sided weakness. Patient recently missed her MRI of the abdomen for the third time as she stated she forgot. I will reschedule a MRI of the abdomen and will try to call her back as a reminder. Dr. Jennye Stevens performed a EGD and found a 1.1 cm distal esophageal stricture and performed esophageal dilation. She also has a  hiatal hernia. He also performed a colonoscopy but couldn't complete it due to poor prep. I expressed the importance of her getting an EUS and possibly a biopsy of the pancreatic cyst if appropriate as evaluated by the EUS. Dr. Meridee Stevens is to perform a EUS with biopsy on July 11th and hopefully she will keep that appointment. She is past due for a mammogram so I will schedule her a bilateral screening mammogram. I reassured her that I don't believe she has an aggressive pancreatic cancer but this is an  enlarging cyst and could represent a slow growing tumor of the pancreas that we need to keep on surveillance. She continues to take Hydrea 500mg  twice a day without difficulty and I will call her to let her know if she needs to adjust the dose. Her CEA was 1.9 and CA 19.9 was 51 on 11/16/2022. Her labs today are pending. I will see her back in 2 months with CBC, CMP, and CA 19.9. Her brother recently had surgery for pancreatic cancer but it was unresectable and he has now been referred to hospice and her other brother had previously died of pancreatic cancer and she reminded me that she has had her father, 2 aunts, and 3 uncles that have died presumably from cancer. The patient understands the plans discussed today and is in agreement with them.  She knows to contact our office if she develops concerns prior to her next appointment.  I provided 30 minutes of face-to-face time during this this encounter and > 50% was spent counseling as documented under my assessment and plan.    Allison Pray MD  [facility] AT [facility] [address] [place] [address] Dept: [phone] Dept Fax: [phone]    CHIEF COMPLAINT:  CC: Essential thrombocythemia  Current Treatment:  Hydroxyurea 500 mg 3 times daily   HISTORY OF PRESENT ILLNESS:  Allison Stevens is a 57 y.o. female with a history of essential thrombocythemia originally diagnosed in January 2005.  This has been treated with hydroxyurea, but the patient has often been non-compliant.  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  She has persistent pain in the left upper quadrant, attributed to splenomegaly, as well as bone pain, for which she remains on OxyContin with oxycodone for breakthrough.  We have tapered her opioids down over time. She saw Dr. Adriana Stevens, her gastroenterologist at [facility], and underwent an esophageal dilatation in August 2016.  She had  already had a colonoscopy due to previous gastrointestinal complaints.  We have been following a lesion in the pancreas seen incidentally on CT done in the emergency room for abdominal pain.  This has been felt to be a benign cyst.  Repeat MRI abdomen in November 2016, revealed a slight increase in the benign appearing cyst of the pancreas.  No splenomegaly was seen.  Repeat MRI abdomen in 1 year was recommended.  She was hospitalized in September 2017 and underwent colonoscopy with removal of a small polyp in the sigmoid colon.  Small internal hemorrhoids were also seen.  Dr. Chales Abrahams also obtained a CEA and CA 19-9, and. the CA 19-9 was elevated at 145, the CEA was normal. Repeat MRI abdomen with contrast in September 2017 revealed a stable 14 mm cystic lesion in the pancreas.  Again, no splenomegaly was seen.  Follow-up MRI in 1 year was recommended.  Repeat MRI abdomen in March 2019 revealed a 17 mm cystic lesion in  the pancreatic uncinate process, which was felt to be stable.  No significant liver lesions were seen.  Repeat MRI abdomen in 1 year was recommended.  Repeat MRI abdomen in September 2019 revealed a stable 19 mm cystic lesion of the pancreas, which appeared to be benign and still felt to be stable.  Repeat MRI abdomen with and without contrast in 1 year was recommended.  MRI abdomen in October 2020 revealed a stable 17 mm cystic lesion of the pancreas.  Continued yearly follow up MRI abdomen was recommended.   Screening mammogram in February 2016 revealed a possible mass in the right breast.  Right diagnostic mammogram and ultrasound was done and revealed 2 well circumscribed hypoechoic lesions in the right breast at 11 o 'clock, felt to represent cysts.  Repeat mammogram and ultrasound in August revealed heterogeneous fibroglandular tissue felt to be benign, with resolution of the previously seen cysts and no masses.  Repeat diagnostic bilateral mammogram and ultrasound in 6 months was once again  recommended.  Bilateral diagnostic mammogram in March 2017 did not reveal any evidence of malignancy, so 1 year follow-up screening mammogram was recommended.  Bilateral screening mammogram in March 2018 did not reveal any evidence of malignancy.  Bone density scan in June 2018 revealed osteopenia with a T-Stevens of -1.5 in the femur.  The patient was instructed to take calcium and vitamin-D twice daily.  The patient's family history is significant in that her father had pancreatic cancer at age 69, a paternal uncle had pancreatic cancer and a paternal aunt had pancreatic cancer.  Her brother also had pancreatic cancer at age 84.  She is appropriate for genetic testing, but this has not been done yet due to insurance coverage.  Bilateral screening mammogram in December 2020 did not reveal any evidence of malignancy.    Repeat MRI abdomen in September 2021 revealed a 1.9 cm cystic lesion in the pancreatic uncinate process, which showed no significant change compared to more recent exams, but has shown a slow increase in size since 2016. 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 Follow-up by MRI in 2 years was recommended. She reports having another stroke in May 2022.  She then had COVID later in May and was hospitalized due to this.  She had been on hydroxyurea 4 times a day, but this was reduced in September to 3 times a day as her platelets were controlled and she was more anemic with a hemoglobin of 10. Repeat CBC in November revealed her hemoglobin to be back up to 11.6.  Her last colonoscopy was in September 2017 and she did have a polypectomy of a tubular adenoma of the sigmoid colon at that time.  He also had an EGD in April of 2018, which showed mild gastritis, a small hiatal hernia, and a Schatzki ring.  She also had esophageal dilation.  INTERVAL HISTORY:  Allison Stevens is here today for repeat clinical assessment essential  thrombocythemia. Patient states that she feels ok and complains of pain under her left rib area, she also recently had a fall in the middle of the road and needed assistance getting up. She had a CT of the head performed on 12/08/2022 that showed no acute intracranial abnormalities, chronic microvascular disease, brain atrophy, and remote lacunar infarct within bilateral basal ganglia. She does has a MRI of the brain scheduled for May 21st by Dr. Leonia Stevens to evaluate new left sided weakness. Patient recently missed  her MRI of the abdomen for the third time as she stated she forgot. I will reschedule a MRI of the abdomen and will try to call her back as a reminder. Dr. Jennye Stevens performed a EGD and found a 1.1 cm distal esophageal stricture and performed esophageal dilation. She also has a hiatal hernia. He also performed a colonoscopy but couldn't complete it due to poor prep. I expressed the importance of her getting a biopsy of the pancreatic cyst if appropriate as evaluated by the EUS. Dr. Meridee Stevens is to perform a EUS with biopsy on July 11th and hopefully she will keep that appointment. She is past due for a mammogram so I will schedule a bilateral screening mammogram. I reassured her that I don't believe she has an aggressive pancreatic cancer but this is an enlarging cyst and could represent a slow growing tumor of the pancreas that we need to keep on surveillance. She continues to take Hydrea 500mg  twice a day without difficulty and I will call her to let her know if she needs to adjust the dose. Her CEA was 1.9 and CA 19.9 was 51 on 11/16/2022. Her labs today are pending. I will see her back in 2 months with CBC, CMP, and CA 19.9. Her brother recently had surgery for pancreatic cancer but it was unresectable and he has now been referred to hospice and her other brother had previously died of pancreatic cancer and she reminded me that she has had her father, 2 aunts, and 3 uncles that have died, presumably  from cancer. She denies signs of infection such as sore throat, sinus drainage, cough, or urinary symptoms.  She denies fevers or recurrent chills. She denies pain. She denies nausea, vomiting, chest pain, dyspnea or cough. Her appetite is ok but improved and her weight has increased 3 pounds over last week .   REVIEW OF SYSTEMS:  Review of Systems  Constitutional:  Negative for appetite change, chills, diaphoresis, fatigue, fever and unexpected weight change.  HENT:   Negative for hearing loss, lump/mass, mouth sores, nosebleeds, sore throat, tinnitus, trouble swallowing and voice change.   Eyes: Negative.  Negative for eye problems and icterus.  Respiratory: Negative.  Negative for chest tightness, cough, hemoptysis, shortness of breath and wheezing.   Cardiovascular: Negative.  Negative for chest pain, leg swelling and palpitations.  Gastrointestinal:  Negative for abdominal distention, abdominal pain, blood in stool, constipation, diarrhea, nausea, rectal pain and vomiting.  Endocrine: Negative.  Negative for hot flashes.  Genitourinary: Negative.  Negative for bladder incontinence, difficulty urinating, dyspareunia, dysuria, frequency, hematuria, menstrual problem, nocturia, pelvic pain, vaginal bleeding and vaginal discharge.   Musculoskeletal:  Positive for arthralgias (right hip since a fall) and gait problem (limping due to previous stroke, has worsened). Negative for back pain, flank pain, myalgias, neck pain and neck stiffness.       Bones hurt  Skin: Negative.  Negative for itching, rash and wound.       Large abrasions on both knee L>R from fall.   Neurological:  Positive for gait problem (limping due to previous stroke, has worsened). Negative for dizziness, extremity weakness, headaches, light-headedness, numbness, seizures and speech difficulty.  Hematological:  Negative for adenopathy. Bruises/bleeds easily.  Psychiatric/Behavioral:  Positive for depression. Negative for  confusion, decreased concentration, sleep disturbance and suicidal ideas. The patient is not nervous/anxious.      VITALS:  Blood pressure (!) 167/76, pulse 74, temperature 98.2 F (36.8 C), temperature source Oral, resp. rate 18, height  5\' 4"  (1.626 m), weight 143 lb 11.2 oz (65.2 kg), SpO2 99 %.  Wt Readings from Last 3 Encounters:  12/15/22 143 lb 11.2 oz (65.2 kg)  12/08/22 140 lb 9.6 oz (63.8 kg)  11/21/22 139 lb (63 kg)    Body mass index is 24.67 kg/m.  Performance status (ECOG): 1 - Symptomatic but completely ambulatory  PHYSICAL EXAM:  Physical Exam Vitals and nursing note reviewed.  Constitutional:      General: She is not in acute distress.    Appearance: Normal appearance. She is normal weight. She is not ill-appearing, toxic-appearing or diaphoretic.  HENT:     Head: Normocephalic and atraumatic.     Right Ear: Tympanic membrane, ear canal and external ear normal. There is no impacted cerumen.     Left Ear: Tympanic membrane, ear canal and external ear normal. There is no impacted cerumen.     Nose: Nose normal. No congestion or rhinorrhea.     Mouth/Throat:     Mouth: Mucous membranes are moist.     Pharynx: Oropharynx is clear. No oropharyngeal exudate or posterior oropharyngeal erythema.  Eyes:     General: No scleral icterus.       Right eye: No discharge.        Left eye: No discharge.     Extraocular Movements: Extraocular movements intact.     Conjunctiva/sclera: Conjunctivae normal.     Pupils: Pupils are equal, round, and reactive to light.  Neck:     Vascular: No carotid bruit.  Cardiovascular:     Rate and Rhythm: Normal rate and regular rhythm.     Pulses: Normal pulses.     Heart sounds: Normal heart sounds. No murmur heard.    No friction rub. No gallop.  Pulmonary:     Effort: Pulmonary effort is normal. No respiratory distress.     Breath sounds: Normal breath sounds. No stridor. No wheezing, rhonchi or rales.  Chest:     Chest wall: No  tenderness.  Abdominal:     General: Bowel sounds are normal. There is no distension.     Palpations: Abdomen is soft. There is no hepatomegaly, splenomegaly or mass.     Tenderness: There is no abdominal tenderness. There is no right CVA tenderness, left CVA tenderness, guarding or rebound.     Hernia: No hernia is present.     Comments:     Musculoskeletal:        General: No swelling, tenderness, deformity or signs of injury. Normal range of motion.     Cervical back: Normal range of motion and neck supple. No rigidity or tenderness.     Right lower leg: No edema.     Left lower leg: No edema.  Lymphadenopathy:     Cervical: No cervical adenopathy.     Upper Body:     Right upper body: No supraclavicular or axillary adenopathy.     Left upper body: No supraclavicular or axillary adenopathy.     Lower Body: No right inguinal adenopathy. No left inguinal adenopathy.  Skin:    General: Skin is warm and dry.     Coloration: Skin is not jaundiced or pale.     Findings: Abrasion present. No bruising, erythema, lesion or rash.     Comments: Large abrasions on both knee L>R.    Neurological:     General: No focal deficit present.     Mental Status: She is alert and oriented to person, place, and time. Mental  status is at baseline.     Cranial Nerves: No cranial nerve deficit.     Sensory: No sensory deficit.     Motor: No weakness.     Coordination: Coordination normal.     Gait: Gait normal.     Deep Tendon Reflexes: Reflexes normal.  Psychiatric:        Mood and Affect: Mood normal.        Behavior: Behavior normal.        Thought Content: Thought content normal.        Judgment: Judgment normal.    LABS:      Latest Ref Rng & Units 12/15/2022    9:10 AM 12/08/2022   10:28 AM 11/21/2022    8:57 AM  CBC  WBC 4.0 - 10.5 K/uL 3.3  3.8  4.0   Hemoglobin 12.0 - 15.0 g/dL 9.7  9.9  91.4   Hematocrit 36.0 - 46.0 % 30.1  28.7  32.5   Platelets 150 - 400 K/uL 269  297  272        Latest Ref Rng & Units 12/15/2022    9:10 AM 12/08/2022   10:28 AM 11/21/2022    8:57 AM  CMP  Glucose 70 - 99 mg/dL 782  956  213   BUN 6 - 20 mg/dL 8  8  23    Creatinine 0.44 - 1.00 mg/dL 0.86  5.78  4.69   Sodium 135 - 145 mmol/L 137  139  142   Potassium 3.5 - 5.1 mmol/L 2.9  3.8  3.9   Chloride 98 - 111 mmol/L 100  98  98   CO2 22 - 32 mmol/L 29  23  24    Calcium 8.9 - 10.3 mg/dL 8.6  9.2  9.7   Total Protein 6.5 - 8.1 g/dL 7.0  6.4  7.2   Total Bilirubin 0.3 - 1.2 mg/dL 0.7  0.4  0.8   Alkaline Phos 38 - 126 U/L 66  90  102   AST 15 - 41 U/L 20  32  24   ALT 0 - 44 U/L 23  24  28     Lab Results  Component Value Date   CEA1 1.9 11/16/2022   /  CEA  Date Value Ref Range Status  11/16/2022 1.9 0.0 - 4.7 ng/mL Final    Comment:    (NOTE)                             Nonsmokers          <3.9                             Smokers             <5.6 [employer] Electrochemiluminescence Immunoassay (ECLIA) Values obtained with different assay methods or kits cannot be used interchangeably.  Results cannot be interpreted as absolute evidence of the presence or absence of malignant disease. Performed At: [facility] [address] [place], [place] [id number] Jolene Schimke MD KG:[id number]    No results found for: "PSA1" Lab Results  Component Value Date   CAN199 51 (H) 11/16/2022   No results found for: "CAN125"  No results found for: "TOTALPROTELP", "ALBUMINELP", "A1GS", "A2GS", "BETS", "BETA2SER", "GAMS", "MSPIKE", "SPEI" Lab Results  Component Value Date   TIBC 259 08/08/2022   TIBC 301 11/09/2021   FERRITIN 134 08/08/2022  FERRITIN 59 11/09/2021   IRONPCTSAT 42 08/08/2022   IRONPCTSAT 26 11/09/2021   Component Ref Range & Units 11/09/2022 3 mo ago  Cholesterol, Total 100 - 199 mg/dL 161 096  Triglycerides 0 - 149 mg/dL 79 82  HDL >04 mg/dL 38 Low  38 Low   VLDL Cholesterol Cal 5 - 40 mg/dL 15 16  LDL Chol Calc (NIH) 0 - 99 mg/dL 540 High   95  Chol/HDL Ratio 0.0 - 4.4 ratio 4.4 3.9 CM   Component Ref Range & Units 11/09/2022 3 mo ago  Hgb A1c MFr Bld 4.8 - 5.6 % 7.5 High  7.7 High  CM  Est. average glucose Bld gHb Est-mCnc mg/dL 981 191   Component Ref Range & Units 08/08/2022  Vitamin B-12 232 - 1,245 pg/mL 307   Component Ref Range & Units 08/08/2022  TSH 0.450 - 4.500 uIU/mL 3.380    No results found for: "LDH"  STUDIES:    HISTORY:   Past Medical History:  Diagnosis Date   Acid reflux    Anxiety    Carpal tunnel syndrome    Cerebrovascular disease    Diabetic retinopathy (HCC)    Family history of pancreatic cancer 08/09/2021   Gait abnormality    History of stroke    Hyperlipidemia    Hypertension    Migraine    Pancreatic cyst 08/09/2021   Retinal detachment    Thrombocytosis    Trigger finger    Ulnar nerve entrapment     Past Surgical History:  Procedure Laterality Date   CESAREAN SECTION  1997   GALLBLADDER SURGERY  2011    Family History  Problem Relation Age of Onset   Diabetes Mother    Cancer Mother    Cancer Father    Healthy Child      Social History:  reports that she has never smoked. She has never used smokeless tobacco. She reports that she does not drink alcohol and does not use drugs.The patient is alone today.  Allergies:  Allergies  Allergen Reactions   Iodinated Contrast Media Other (See Comments) and Shortness Of Breath   Morphine Itching and Other (See Comments)    "Makes me feel out of my mind" per pt.    Current Medications: Current Outpatient Medications  Medication Sig Dispense Refill   atorvastatin (LIPITOR) 40 MG tablet Take 1 tablet (40 mg total) by mouth at bedtime. 90 tablet 1   ALPRAZolam (XANAX) 0.5 MG tablet Take 1 tablet (0.5 mg total) by mouth every 8 (eight) hours as needed for anxiety. 90 tablet 2   amLODipine (NORVASC) 10 MG tablet Take 10 mg by mouth daily.     aspirin 81 MG chewable tablet Chew 1 tablet by mouth daily.      carvedilol (COREG) 12.5 MG tablet Take 12.5 mg by mouth 2 (two) times daily.     clopidogrel (PLAVIX) 75 MG tablet Take 1 tablet (75 mg total) by mouth daily. 90 tablet 3   diclofenac Sodium (VOLTAREN) 1 % GEL Apply 4 g topically 4 (four) times daily.     doxycycline (MONODOX) 100 MG capsule Take 1 capsule (100 mg total) by mouth 2 (two) times daily. 20 capsule 0   enalapril (VASOTEC) 20 MG tablet Take 20 mg by mouth 2 (two) times daily.     fluticasone (FLONASE) 50 MCG/ACT nasal spray 1 spray as needed.     fluticasone (FLONASE) 50 MCG/ACT nasal spray PLACE 1 SPRAY INTO BOTH NOSTRILS IN THE  MORNING AND AT BEDTIME. 16 mL 1   fluticasone (FLOVENT HFA) 44 MCG/ACT inhaler      glimepiride (AMARYL) 2 MG tablet Take by mouth.     hydroxyurea (HYDREA) 500 MG capsule Take 500 mg by mouth 3 (three) times daily. May take with food to minimize GI side effects.     ibuprofen (ADVIL,MOTRIN) 800 MG tablet Take 800 mg by mouth every 8 (eight) hours as needed.     Insulin Detemir (LEVEMIR) 100 UNIT/ML Pen Inject into the skin.     Insulin Pen Needle (BD PEN NEEDLE NANO 2ND GEN) 32G X 4 MM MISC Check blood sugar three times a day 100 each 3   lidocaine (XYLOCAINE) 1 % (with preservative) injection by Infiltration route.     metFORMIN (GLUCOPHAGE) 1000 MG tablet Take 1,000 mg by mouth 2 (two) times daily with a meal.     metoCLOPramide (REGLAN) 5 MG tablet Take 5 mg by mouth every 8 (eight) hours.     NOVOLOG FLEXPEN 100 UNIT/ML FlexPen INJECT 34 UNIT(S) INTO THE SKIN 3 TIMES DAILY BEFORE MEALS 15 mL 3   ondansetron (ZOFRAN) 4 MG tablet Take 1 tablet (4 mg total) by mouth every 8 (eight) hours as needed for nausea or vomiting. 20 tablet 0   Oxycodone HCl 10 MG TABS Take 1 tablet (10 mg total) by mouth every 4 (four) hours as needed. 120 tablet 0   pantoprazole (PROTONIX) 40 MG tablet Take 1 tablet (40 mg total) by mouth daily. 30 tablet 6   Potassium Chloride ER 20 MEQ TBCR Take 1 tablet (20 mEq total) by mouth  in the morning, at noon, and at bedtime. 90 tablet 5   prochlorperazine (COMPAZINE) 10 MG tablet Take 10 mg by mouth every 6 (six) hours as needed for nausea or vomiting.     spironolactone (ALDACTONE) 25 MG tablet Take 0.5 tablets by mouth daily.     TOUJEO MAX SOLOSTAR 300 UNIT/ML Solostar Pen SMARTSIG:80 Unit(s) SUB-Q Every Night 300 mL 1   triamcinolone acetonide (TRIESENCE) 40 MG/ML SUSP Inject into the articular space.     TRULICITY 4.5 MG/0.5ML SOPN SMARTSIG:1 Pre-Filled Pen Syringe SUB-Q Once a Week     No current facility-administered medications for this visit.     I,Jasmine M Lassiter,acting as a scribe for Dellia Beckwith, MD.,have documented all relevant documentation on the behalf of Dellia Beckwith, MD,as directed by  Dellia Beckwith, MD while in the presence of Dellia Beckwith, MD.

## 2022-12-15 ENCOUNTER — Encounter: Payer: Self-pay | Admitting: Oncology

## 2022-12-15 ENCOUNTER — Other Ambulatory Visit: Payer: Self-pay | Admitting: Oncology

## 2022-12-15 ENCOUNTER — Inpatient Hospital Stay: Payer: Medicare Other | Attending: Hematology and Oncology | Admitting: Oncology

## 2022-12-15 ENCOUNTER — Inpatient Hospital Stay: Payer: Medicare Other

## 2022-12-15 VITALS — BP 167/76 | HR 74 | Temp 98.2°F | Resp 18 | Ht 64.0 in | Wt 143.7 lb

## 2022-12-15 DIAGNOSIS — K222 Esophageal obstruction: Secondary | ICD-10-CM | POA: Insufficient documentation

## 2022-12-15 DIAGNOSIS — D701 Agranulocytosis secondary to cancer chemotherapy: Secondary | ICD-10-CM

## 2022-12-15 DIAGNOSIS — K862 Cyst of pancreas: Secondary | ICD-10-CM

## 2022-12-15 DIAGNOSIS — D473 Essential (hemorrhagic) thrombocythemia: Secondary | ICD-10-CM

## 2022-12-15 DIAGNOSIS — Z86010 Personal history of colonic polyps: Secondary | ICD-10-CM | POA: Insufficient documentation

## 2022-12-15 DIAGNOSIS — Z800 Family history of malignant neoplasm of digestive organs: Secondary | ICD-10-CM | POA: Diagnosis not present

## 2022-12-15 DIAGNOSIS — T451X5A Adverse effect of antineoplastic and immunosuppressive drugs, initial encounter: Secondary | ICD-10-CM | POA: Diagnosis not present

## 2022-12-15 LAB — CMP (CANCER CENTER ONLY)
ALT: 23 U/L (ref 0–44)
AST: 20 U/L (ref 15–41)
Albumin: 3.7 g/dL (ref 3.5–5.0)
Alkaline Phosphatase: 66 U/L (ref 38–126)
Anion gap: 8 (ref 5–15)
BUN: 8 mg/dL (ref 6–20)
CO2: 29 mmol/L (ref 22–32)
Calcium: 8.6 mg/dL — ABNORMAL LOW (ref 8.9–10.3)
Chloride: 100 mmol/L (ref 98–111)
Creatinine: 0.85 mg/dL (ref 0.44–1.00)
GFR, Estimated: >60 mL/min (ref >60)
Glucose, Bld: 251 mg/dL — ABNORMAL HIGH (ref 70–99)
Potassium: 2.9 mmol/L — ABNORMAL LOW (ref 3.5–5.1)
Sodium: 137 mmol/L (ref 135–145)
Total Bilirubin: 0.7 mg/dL (ref 0.3–1.2)
Total Protein: 7 g/dL (ref 6.5–8.1)

## 2022-12-15 LAB — CBC WITH DIFFERENTIAL (CANCER CENTER ONLY)
Abs Immature Granulocytes: 0.01 10*3/uL (ref 0.00–0.07)
Basophils Absolute: 0 10*3/uL (ref 0.0–0.1)
Basophils Relative: 0 %
Eosinophils Absolute: 0.1 10*3/uL (ref 0.0–0.5)
Eosinophils Relative: 2 %
HCT: 30.1 % — ABNORMAL LOW (ref 36.0–46.0)
Hemoglobin: 9.7 g/dL — ABNORMAL LOW (ref 12.0–15.0)
Immature Granulocytes: 0 %
Lymphocytes Relative: 28 %
Lymphs Abs: 0.9 10*3/uL (ref 0.7–4.0)
MCH: 31.5 pg (ref 26.0–34.0)
MCHC: 32.2 g/dL (ref 30.0–36.0)
MCV: 97.7 fL (ref 80.0–100.0)
Monocytes Absolute: 0.1 10*3/uL (ref 0.1–1.0)
Monocytes Relative: 4 %
Neutro Abs: 2.2 10*3/uL (ref 1.7–7.7)
Neutrophils Relative %: 66 %
Platelet Count: 269 10*3/uL (ref 150–400)
RBC: 3.08 MIL/uL — ABNORMAL LOW (ref 3.87–5.11)
RDW: 16.3 % — ABNORMAL HIGH (ref 11.5–15.5)
WBC Count: 3.3 10*3/uL — ABNORMAL LOW (ref 4.0–10.5)
nRBC: 0 % (ref 0.0–0.2)

## 2022-12-16 ENCOUNTER — Telehealth: Payer: Self-pay | Admitting: Oncology

## 2022-12-16 NOTE — Telephone Encounter (Signed)
5/17/24Spoke with patient and scheduled MRI ABD on 12/21/22@11am .Mammogram on 12/20/22@1045am 

## 2022-12-20 ENCOUNTER — Telehealth: Payer: Self-pay

## 2022-12-20 ENCOUNTER — Other Ambulatory Visit: Payer: Self-pay

## 2022-12-20 ENCOUNTER — Other Ambulatory Visit: Payer: Self-pay | Admitting: Oncology

## 2022-12-20 DIAGNOSIS — G894 Chronic pain syndrome: Secondary | ICD-10-CM

## 2022-12-20 DIAGNOSIS — R531 Weakness: Secondary | ICD-10-CM | POA: Diagnosis not present

## 2022-12-20 DIAGNOSIS — G319 Degenerative disease of nervous system, unspecified: Secondary | ICD-10-CM | POA: Diagnosis not present

## 2022-12-20 DIAGNOSIS — I6381 Other cerebral infarction due to occlusion or stenosis of small artery: Secondary | ICD-10-CM | POA: Diagnosis not present

## 2022-12-20 DIAGNOSIS — E876 Hypokalemia: Secondary | ICD-10-CM

## 2022-12-20 DIAGNOSIS — Z1231 Encounter for screening mammogram for malignant neoplasm of breast: Secondary | ICD-10-CM | POA: Diagnosis not present

## 2022-12-20 DIAGNOSIS — D473 Essential (hemorrhagic) thrombocythemia: Secondary | ICD-10-CM

## 2022-12-20 MED ORDER — OXYCODONE HCL 10 MG PO TABS
10.0000 mg | ORAL_TABLET | ORAL | 0 refills | Status: DC | PRN
Start: 2022-12-20 — End: 2023-01-18

## 2022-12-20 MED ORDER — POTASSIUM CHLORIDE ER 20 MEQ PO TBCR
1.0000 | EXTENDED_RELEASE_TABLET | Freq: Three times a day (TID) | ORAL | 5 refills | Status: DC
Start: 2022-12-20 — End: 2023-01-27

## 2022-12-20 NOTE — Telephone Encounter (Signed)
-----   Message from Dellia Beckwith, MD sent at 12/20/2022 10:15 AM EDT ----- Regarding: call Tell her K is dangerously low, she needs to take 20 meq tid and stop the HCTZ Her blood counts are too low, she needs to go down to 1 hydrea/day

## 2022-12-20 NOTE — Telephone Encounter (Signed)
Called patient and notified her of the labs results and also to take Potassium TID and to stop the HCTZ. Along with decreasing Hydrea to once day. Patient will need a script sent to pharmacy for Potassium TID. Patient states she is out of them.

## 2022-12-21 DIAGNOSIS — K8689 Other specified diseases of pancreas: Secondary | ICD-10-CM | POA: Diagnosis not present

## 2022-12-21 DIAGNOSIS — R1900 Intra-abdominal and pelvic swelling, mass and lump, unspecified site: Secondary | ICD-10-CM | POA: Diagnosis not present

## 2022-12-22 ENCOUNTER — Encounter: Payer: Self-pay | Admitting: Oncology

## 2022-12-22 ENCOUNTER — Telehealth: Payer: Self-pay | Admitting: Oncology

## 2022-12-22 NOTE — Telephone Encounter (Signed)
Contacted pt to schedule an appt. Unable to reach via phone.   labs Received: 2 days ago Dellia Beckwith, MD sent to Jeannette Corpus, LPN; Thomasena Edis; [place], [place] See other message about low K and blood counts, the rest was normal We need to recheck labs in mid June, pls let schedulers know

## 2022-12-23 ENCOUNTER — Telehealth: Payer: Self-pay | Admitting: Oncology

## 2022-12-23 NOTE — Telephone Encounter (Signed)
Patient has been scheduled. Aware of appt date and time    labs Received: 3 days ago Dellia Beckwith, MD sent to Jeannette Corpus, LPN; Thomasena Edis; [place], [place] See other message about low K and blood counts, the rest was normal We need to recheck labs in mid June, pls let schedulers know

## 2022-12-30 ENCOUNTER — Other Ambulatory Visit: Payer: Self-pay | Admitting: Oncology

## 2022-12-30 ENCOUNTER — Telehealth: Payer: Self-pay | Admitting: *Deleted

## 2022-12-30 DIAGNOSIS — T451X5A Adverse effect of antineoplastic and immunosuppressive drugs, initial encounter: Secondary | ICD-10-CM | POA: Insufficient documentation

## 2022-12-30 DIAGNOSIS — R978 Other abnormal tumor markers: Secondary | ICD-10-CM | POA: Insufficient documentation

## 2022-12-30 DIAGNOSIS — K862 Cyst of pancreas: Secondary | ICD-10-CM

## 2022-12-30 NOTE — Telephone Encounter (Signed)
12/30/22 Left voice msgs upcoming appt on 01/13/23@9am  lab appt

## 2023-01-04 ENCOUNTER — Encounter: Payer: Self-pay | Admitting: Oncology

## 2023-01-13 ENCOUNTER — Telehealth: Payer: Self-pay

## 2023-01-13 ENCOUNTER — Inpatient Hospital Stay: Payer: Medicare Other | Attending: Hematology and Oncology

## 2023-01-13 ENCOUNTER — Other Ambulatory Visit: Payer: Self-pay | Admitting: Internal Medicine

## 2023-01-13 ENCOUNTER — Other Ambulatory Visit: Payer: Self-pay | Admitting: Pharmacist

## 2023-01-13 ENCOUNTER — Inpatient Hospital Stay: Payer: Medicare Other

## 2023-01-13 VITALS — BP 197/88 | HR 73 | Temp 98.3°F | Resp 16 | Ht 64.0 in | Wt 141.1 lb

## 2023-01-13 DIAGNOSIS — D473 Essential (hemorrhagic) thrombocythemia: Secondary | ICD-10-CM | POA: Insufficient documentation

## 2023-01-13 DIAGNOSIS — E876 Hypokalemia: Secondary | ICD-10-CM | POA: Insufficient documentation

## 2023-01-13 LAB — CMP (CANCER CENTER ONLY)
ALT: 21 U/L (ref 0–44)
AST: 17 U/L (ref 15–41)
Albumin: 3.6 g/dL (ref 3.5–5.0)
Alkaline Phosphatase: 65 U/L (ref 38–126)
Anion gap: 9 (ref 5–15)
BUN: 8 mg/dL (ref 6–20)
CO2: 31 mmol/L (ref 22–32)
Calcium: 8.6 mg/dL — ABNORMAL LOW (ref 8.9–10.3)
Chloride: 99 mmol/L (ref 98–111)
Creatinine: 0.86 mg/dL (ref 0.44–1.00)
GFR, Estimated: >60 mL/min (ref >60)
Glucose, Bld: 323 mg/dL — ABNORMAL HIGH (ref 70–99)
Potassium: 2.6 mmol/L — CL (ref 3.5–5.1)
Sodium: 139 mmol/L (ref 135–145)
Total Bilirubin: 0.7 mg/dL (ref 0.3–1.2)
Total Protein: 7 g/dL (ref 6.5–8.1)

## 2023-01-13 LAB — CBC WITH DIFFERENTIAL (CANCER CENTER ONLY)
Abs Immature Granulocytes: 0.01 10*3/uL (ref 0.00–0.07)
Basophils Absolute: 0 10*3/uL (ref 0.0–0.1)
Basophils Relative: 0 %
Eosinophils Absolute: 0 10*3/uL (ref 0.0–0.5)
Eosinophils Relative: 1 %
HCT: 30.3 % — ABNORMAL LOW (ref 36.0–46.0)
Hemoglobin: 9.8 g/dL — ABNORMAL LOW (ref 12.0–15.0)
Immature Granulocytes: 0 %
Lymphocytes Relative: 30 %
Lymphs Abs: 0.8 10*3/uL (ref 0.7–4.0)
MCH: 31.1 pg (ref 26.0–34.0)
MCHC: 32.3 g/dL (ref 30.0–36.0)
MCV: 96.2 fL (ref 80.0–100.0)
Monocytes Absolute: 0.1 10*3/uL (ref 0.1–1.0)
Monocytes Relative: 6 %
Neutro Abs: 1.6 10*3/uL — ABNORMAL LOW (ref 1.7–7.7)
Neutrophils Relative %: 63 %
Platelet Count: 273 10*3/uL (ref 150–400)
RBC: 3.15 MIL/uL — ABNORMAL LOW (ref 3.87–5.11)
RDW: 15.5 % (ref 11.5–15.5)
WBC Count: 2.6 10*3/uL — ABNORMAL LOW (ref 4.0–10.5)
nRBC: 0 % (ref 0.0–0.2)

## 2023-01-13 MED ORDER — SODIUM CHLORIDE 0.9 % IV SOLN: INTRAVENOUS | Status: DC

## 2023-01-13 MED ORDER — SODIUM CHLORIDE 0.9 % IV SOLN: Freq: Once | INTRAVENOUS | Status: DC

## 2023-01-13 MED ORDER — SODIUM CHLORIDE 0.9 % IV SOLN: Freq: Once | INTRAVENOUS | Status: AC

## 2023-01-13 MED ORDER — POTASSIUM CHLORIDE 10 MEQ/100ML IV SOLN
10.0000 meq | INTRAVENOUS | Status: AC
  Administered 2023-01-13 (×3): 10 meq via INTRAVENOUS
  Filled 2023-01-13 (×3): qty 100

## 2023-01-13 NOTE — Telephone Encounter (Signed)
-----   Message from Domenic Schwab, [facility] sent at 01/13/2023  2:02 PM EDT ----- Regarding: RE: tell pt Misty Stanley told her this information while here at [facility] [address].  ----- Message ----- From: Dellia Beckwith, MD Sent: 01/13/2023   1:27 PM EDT To: Domenic Schwab, RPH; Jeannette Corpus, LPN; # Subject: tell pt                                        Getting IV K 30 meq at infusion for K of 2.6. Also has BS 323.  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 7/11 for the EUS  She needs to take the oral K tid - I ordered last month with plenty of refills Blood counts low, I want her to stop the Hydrea  Keep appt with me in July and will recheck the blood I will send reports to Dr. Leonia Reader, he also sees in July

## 2023-01-13 NOTE — Progress Notes (Signed)
Patient requests that KCL run be stopped. States " I'm finished" .

## 2023-01-13 NOTE — Progress Notes (Signed)
Patient informed of following per McCarty's instruction:  1.Mammogram is clear  2. 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 7/11 for the EUS  3.She needs to take the oral K tid 4.Stop the Hydrea  5.Keep appt with Dr. Gilman Buttner in July and labs will be rechecked 6. Dr. Gilman Buttner will send reports to Dr. Leonia Reader

## 2023-01-13 NOTE — Patient Instructions (Signed)
Hypokalemia Hypokalemia means that the amount of potassium in the blood is lower than normal. Potassium is a mineral (electrolyte) that helps regulate the amount of fluid in the body. It also stimulates muscle tightening (contraction) and helps nerves work properly. Normally, most of the body's potassium is inside cells, and only a very small amount is in the blood. Because the amount in the blood is so small, minor changes to potassium levels in the blood can be life-threatening. What are the causes? This condition may be caused by: Antibiotic medicine. Diarrhea or vomiting. Taking too much of a medicine that helps you have a bowel movement (laxative) can cause diarrhea and lead to hypokalemia. Chronic kidney disease (CKD). Medicines that help the body get rid of excess fluid (diuretics). Eating disorders, such as anorexia or bulimia. Low magnesium levels in the body. Sweating a lot. What are the signs or symptoms? Symptoms of this condition include: Weakness. Constipation. Fatigue. Muscle cramps. Mental confusion. Skipped heartbeats or irregular heartbeat (palpitations). Tingling or numbness. How is this diagnosed? This condition is diagnosed with a blood test. How is this treated? This condition may be treated by: Taking potassium supplements. Adjusting the medicines that you take. Eating more foods that contain a lot of potassium. If your potassium level is very low, you may need to get potassium through an IV and be monitored in the hospital. Follow these instructions at home: Eating and drinking  Eat a healthy diet. A healthy diet includes fresh fruits and vegetables, whole grains, healthy fats, and lean proteins. If told, eat more foods that contain a lot of potassium. These include: Nuts, such as peanuts and pistachios. Seeds, such as sunflower seeds and pumpkin seeds. Peas, lentils, and lima beans. Whole grain and bran cereals and breads. Fresh fruits and vegetables,  such as apricots, avocado, bananas, cantaloupe, kiwi, oranges, tomatoes, asparagus, and potatoes. Juices, such as orange, tomato, and prune. Lean meats, including fish. Milk and milk products, such as yogurt. General instructions Take over-the-counter and prescription medicines only as told by your health care provider. This includes vitamins, natural food products, and supplements. Keep all follow-up visits. This is important. Contact a health care provider if: You have weakness that gets worse. You feel your heart pounding or racing. You vomit. You have diarrhea. You have diabetes and you have trouble keeping your blood sugar in your target range. Get help right away if: You have chest pain. You have shortness of breath. You have vomiting or diarrhea that lasts for more than 2 days. You faint. These symptoms may be an emergency. Get help right away. Call 911. Do not wait to see if the symptoms will go away. Do not drive yourself to the hospital. Summary Hypokalemia means that the amount of potassium in the blood is lower than normal. This condition is diagnosed with a blood test. Hypokalemia may be treated by taking potassium supplements, adjusting the medicines that you take, or eating more foods that are high in potassium. If your potassium level is very low, you may need to get potassium through an IV and be monitored in the hospital. This information is not intended to replace advice given to you by your health care provider. Make sure you discuss any questions you have with your health care provider. Document Revised: 04/01/2021 Document Reviewed: 04/01/2021 Elsevier Patient Education  2024 [employer]  

## 2023-01-13 NOTE — Progress Notes (Signed)
CRITICAL VALUE STICKER  CRITICAL VALUE:  K+ 2.6  RECEIVER (on-site recipient of call):  Dyane Dustman RN  DATE & TIME NOTIFIED:   01/13/2023 @ 1211  MESSENGER (representative from lab):  Jolanda at [facility] lab  MD NOTIFIED:   Dr. Gilman Buttner  TIME OF NOTIFICATION:  1215  RESPONSE:  Patient scheduled for IV K+ at [facility] as soon as she can get there.   Patient aware.

## 2023-01-16 ENCOUNTER — Other Ambulatory Visit: Payer: Self-pay

## 2023-01-16 ENCOUNTER — Encounter: Payer: Self-pay | Admitting: Oncology

## 2023-01-16 NOTE — Telephone Encounter (Signed)
Oxycodone refill not due until 01/20/2023 (30d script). 01/18/23 sent request to Dr Gilman Buttner for refill.

## 2023-01-18 ENCOUNTER — Other Ambulatory Visit: Payer: Self-pay

## 2023-01-18 ENCOUNTER — Encounter: Payer: Self-pay | Admitting: Oncology

## 2023-01-18 ENCOUNTER — Other Ambulatory Visit: Payer: Self-pay | Admitting: Internal Medicine

## 2023-01-18 DIAGNOSIS — G894 Chronic pain syndrome: Secondary | ICD-10-CM

## 2023-01-18 DIAGNOSIS — R161 Splenomegaly, not elsewhere classified: Secondary | ICD-10-CM

## 2023-01-18 DIAGNOSIS — M898X9 Other specified disorders of bone, unspecified site: Secondary | ICD-10-CM

## 2023-01-18 MED ORDER — OXYCODONE HCL 10 MG PO TABS
10.0000 mg | ORAL_TABLET | ORAL | 0 refills | Status: DC | PRN
Start: 2023-01-18 — End: 2023-02-15

## 2023-01-18 MED ORDER — ALPRAZOLAM 0.5 MG PO TABS: 0.5000 mg | ORAL_TABLET | Freq: Three times a day (TID) | ORAL | 2 refills | Status: DC | PRN

## 2023-01-26 ENCOUNTER — Encounter (HOSPITAL_COMMUNITY): Payer: Self-pay | Admitting: Gastroenterology

## 2023-01-27 ENCOUNTER — Telehealth: Payer: Self-pay

## 2023-01-27 ENCOUNTER — Other Ambulatory Visit: Payer: Self-pay | Admitting: Oncology

## 2023-01-27 DIAGNOSIS — E876 Hypokalemia: Secondary | ICD-10-CM

## 2023-01-27 NOTE — Telephone Encounter (Signed)
-----   Message from Loretha Stapler, RN sent at 01/18/2023  8:36 AM EDT -----  ----- Message ----- From: Loretha Stapler, RN Sent: 01/18/2023  12:00 AM EDT To: Loretha Stapler, RN  Make sure to have plavix hold for 7/11 case

## 2023-01-27 NOTE — Telephone Encounter (Signed)
Placed call to the pt no answer no voice mail. Message left with Dr Verl Dicker office and letter has been resent.  Will try again later

## 2023-01-30 NOTE — Telephone Encounter (Signed)
Attempted to reach the pt and no answer no voice mail   Call placed to Dr Eda Paschal at [phone] and the nurse tells me that Dr Eda Paschal will review and fax response to our office this afternoon.

## 2023-01-31 NOTE — Telephone Encounter (Signed)
I have attempted to reach the pt at all available numbers with no answer and voice mail full.  She does not have My Chart.  I have sent all information to her in the mail.    I am still waiting for anti coag response from Dr Eda Paschal.

## 2023-01-31 NOTE — Telephone Encounter (Signed)
Response received with ok to hold plavix 5 days prior to procedure.   Unable to reach pt

## 2023-02-01 NOTE — Telephone Encounter (Signed)
The pt has been advised of the plavix hold.  The pt has been advised of the information and verbalized understanding.

## 2023-02-08 ENCOUNTER — Ambulatory Visit (HOSPITAL_COMMUNITY): Payer: Medicare Other | Admitting: Anesthesiology

## 2023-02-08 NOTE — Progress Notes (Addendum)
ADDENDUM: Her potassium is once again dangerously low at 2.8. I offered IV supplement (as we did last month) and instructed her to increase her oral supplement to 20 meq 4 times daily. Her glucose was 302 and calcium 8.7. The white count was slightly improved to 2.8 with an ANC of 1900. Her hemoglobin is better at 10.4 and platelets normal at 265,000 so she will stay off the Hydrea. The CA 19-9 has increased from 51 to 56 since April.     [facility]  [address] [place],  [place]  [address] [phone]  Clinic Day: 02/14/23  Referring physician: Crist Fat, MD  ASSESSMENT & PLAN:  Assessment & Plan: Essential thrombocytosis [facility]) Essential thrombocythemia, well controlled with hydroxyurea 500 mg, was decreased from 4 times daily to 3 times daily since she had worsening anemia and leukopenia, and then decreased to twice daily in April 2024. In June, she had worsening leukopenia with the WBC  to 2.6, and worsening anemia, her platelets were normal at 274,000. Her hydroxyurea was stopped. Today's labs are pending.  Anemia Her hemoglobin was stable at 10.3 earlier this year and has fluctuated up and down. The most recent was 9.8 in June, 2024.   Leukopenia  Her white count was down to 2.6 with a mild neutropenia. The ANC is 1500. Most likely this is from her hydroxyurea, and that has been stopped. Unfortunately she is also at risk for development of acute leukemia due to her myeloproliferative disorder as well as the hydroxyurea.   Family history of pancreatic cancer First-degree relative, brother, with pancreatic cancer.  He declined genetic testing.  Genetic testing has been recommended for the patient, but not done due to lack of insurance coverage. That brother has expired but now her other brother ha also been diagnosed with pancreatic cancer and was unresectable at [facility]. She tells me he has now been referred to hospice.   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 This has slowly increased since 2016.  Now it has increased significantly to 3.8 cm in maximum diameter and appears to communicate with the main pancreatic duct. Her most recent MRI of the abdomen was done in May, 2024 and revealed further enlargement of the pancreatic lesion to 4.5 cm. She has a strong family history for pancreatic cancer, her other brother has now been referred to hospice because it was unresectable  at the time of surgery . She has missed 3 appointment for a MRI of the abdomen to follow-up on the pancreatic cyst. She has been referred to gastroenterology in [facility] for an EUS and it is scheduled by Dr. Meridee Score on July 11th.  Her biopsy will be rescheduled by Dr Meridee Score to August due to the severely elevated BP on July 11th.  Esophageal Stricture She has an esophageal stricture at the GE junction with erosions and had dilation on April 30th by Dr. Jennye Boroughs .   Colon Polyps She last had colonoscopy in September 2017 and had a tubular adenoma removed from the sigmoid colon. He was unable to perform the colonoscopy due to poor prep. He will reschedule.   Chronic pain She is on chronic opioids due to bone pain from her myeloproliferative disease. She has also had severe abdominal pain for the last 6 months.  Plan:  She had a CT of the head performed on 12/08/2022 that showed no acute intracranial abnormalities,  chronic microvascular disease, brain atrophy, and remote lacunar infarct within bilateral basal ganglia.  Her blood pressure has been constantly elevated, today it was 201/97 at the beginning of the visit. She is currently on medication for her elevated blood pressure and elevated blood glucose, and I advised her it to take it as ordered and follow up with her PCP, Dr Leonia Reader. She has a chronic history of non-compliance. Her biopsy was rescheduled to August  by Dr Meridee Score due to the elevated BP. I told her to follow up with him to have her biopsy done, it is crucial to get a diagnosis. I also informed her that we need to keep her BP under control to avoid another stroke. I drew her pictures of this enlarging cyst, growing from 2.5 cm in January of 2022 to 3.8 cm in November of 2023, to 4.5 cm in May of 2024. I also advised her to stay off hydroxyurea for now due to worsening anemia and leukopenia. Fortunately, her platelet count was normal, but we will keep checking her blood counts regularly., on a monthly basis. She is yet to reschedule the EUS with biopsy with Dr Meridee Score. Her labs are pending today. Her CA 19-9 was elevated in April, 2024 at 66 and so we drew another one today and will check monthly. Her brother recently had surgery for pancreatic cancer but it was unresectable and he has now been referred to hospice and her other brother had previously died of pancreatic cancer and she reminded me that she has had her father, 2 aunts, and 3 uncles that have died, presumably from cancer. She will come  back in 1 month with CBC, CMP, and CA 19-9 to see [facility] and in 2 months with CBC, CMP, and CA 19-9 to see me. Hopefully by then we will have a biopsy result. The patient understands the plans discussed today and is in agreement with them.  She knows to contact our office if she develops concerns prior to her next appointment.   I provided 30 minutes of face-to-face time during this this encounter and > 50% was spent counseling as documented under my assessment and plan.    Gery Pray MD  [facility] AT [facility] [address] [place] [address] Dept: [phone] Dept Fax: [phone]    CHIEF COMPLAINT:  CC: Essential thrombocythemia  Current Treatment:  Hydroxyurea 500 mg 3 times daily   HISTORY OF PRESENT ILLNESS:  Allison Stevens is a 57 y.o. female with a history  of essential thrombocythemia originally diagnosed in January 2005.  This has been treated with hydroxyurea, but the patient has often been non-compliant.  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  She has persistent pain in the left upper quadrant, attributed to splenomegaly, as well as bone pain, for which she remains on OxyContin with oxycodone for breakthrough.  We have tapered her opioids down over time. She saw Dr. Adriana Simas, her gastroenterologist at [facility], and underwent an esophageal dilatation in August 2016.  She had already had a colonoscopy due to previous gastrointestinal complaints.  We have been following a lesion in the pancreas seen incidentally on CT done in the emergency room for abdominal pain.  This has been felt to be a benign cyst.  Repeat MRI abdomen in November 2016, revealed a slight increase in the benign appearing cyst of the pancreas.  No splenomegaly was seen.  Repeat MRI abdomen in 1  year was recommended.  She was hospitalized in September 2017 and underwent colonoscopy with removal of a small polyp in the sigmoid colon.  Small internal hemorrhoids were also seen.  Dr. Chales Abrahams also obtained a CEA and CA 19-9, and. the CA 19-9 was elevated at 145, the CEA was normal. Repeat MRI abdomen with contrast in September 2017 revealed a stable 14 mm cystic lesion in the pancreas.  Again, no splenomegaly was seen.  Follow-up MRI in 1 year was recommended.  Repeat MRI abdomen in March 2019 revealed a 17 mm cystic lesion in the pancreatic uncinate process, which was felt to be stable.  No significant liver lesions were seen.  Repeat MRI abdomen in 1 year was recommended.  Repeat MRI abdomen in September 2019 revealed a stable 19 mm cystic lesion of the pancreas, which appeared to be benign and still felt to be stable.  Repeat MRI abdomen with and without contrast in 1 year was recommended.  MRI abdomen in October 2020 revealed a stable 17 mm cystic lesion of the pancreas.   Continued yearly follow up MRI abdomen was recommended.   Screening mammogram in February 2016 revealed a possible mass in the right breast.  Right diagnostic mammogram and ultrasound was done and revealed 2 well circumscribed hypoechoic lesions in the right breast at 11 o 'clock, felt to represent cysts.  Repeat mammogram and ultrasound in August revealed heterogeneous fibroglandular tissue felt to be benign, with resolution of the previously seen cysts and no masses.  Repeat diagnostic bilateral mammogram and ultrasound in 6 months was once again recommended.  Bilateral diagnostic mammogram in March 2017 did not reveal any evidence of malignancy, so 1 year follow-up screening mammogram was recommended.  Bilateral screening mammogram in March 2018 did not reveal any evidence of malignancy.  Bone density scan in June 2018 revealed osteopenia with a T-score of -1.5 in the femur.  The patient was instructed to take calcium and vitamin-D twice daily.  The patient's family history is significant in that her father had pancreatic cancer at age 15, a paternal uncle had pancreatic cancer and a paternal aunt had pancreatic cancer.  Her brother also had pancreatic cancer at age 32.  She is appropriate for genetic testing, but this has not been done yet due to insurance coverage.  Bilateral screening mammogram in December 2020 did not reveal any evidence of malignancy.    Repeat MRI abdomen in September 2021 revealed a 1.9 cm cystic lesion in the pancreatic uncinate process, which showed no significant change compared to more recent exams, but has shown a slow increase in size since 2016. 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 Follow-up by MRI in 2 years was recommended. She reports having another stroke in May 2022.  She then had COVID later in May and was hospitalized due to this.  She had been on hydroxyurea 4 times a day, but this was reduced  in September to 3 times a day as her platelets were controlled and she was more anemic with a hemoglobin of 10. Repeat CBC in November revealed her hemoglobin to be back up to 11.6.  Her last colonoscopy was in September 2017 and she did have a polypectomy of a tubular adenoma of the sigmoid colon at that time.  He also had an EGD in April of 2018, which showed mild gastritis, a small hiatal hernia, and a Schatzki ring.  She also had esophageal dilation.  INTERVAL  HISTORY:  Diamante is here today for repeat clinical assessment essential thrombocythemia and an enlarging pancreatic cyst. Patient states that she feels not too good and complains of chronic pain in her left lower rib area rated 5/10. She has been feeling unsteady and has had intermittent dizziness. She has had 1 -2 falls since her last visit. She also complains of stress at home. Her blood pressure has been constantly elevated, today it was 201/97 at the beginning of the visit. She is currently on medication for her elevated blood pressure and elevated blood glucose, and I advised her it to take it as ordered and follow up with her PCP, Dr Leonia Reader. Her biopsy was rescheduled to August by Dr Meridee Score due to the elevated BP. I told her follow up with him to have her biopsy done. I also informed her that we need to keep her BP under control to avoid another stroke. I drew her pictures of this enlarging cyst, growing from 2.5 cm in January of 2022 to 3.8 cm in November of 2023, to 4.5 cm in May of 2024. I also advised her to stay off hydroxyurea for now due to worsening anemia and leukopenia. Fortunately, her platelet count was normal, but we will keep checking her blood counts regularly. She is yet to reschedule the EUS with biopsy with Dr Meridee Score. Her labs are pending today. Her CA 19-9 was elevated in April, 2024 at 18 and so we drew another one today and will check monthly. Her brother recently had surgery for pancreatic cancer but it was  unresectable and he has now been referred to hospice and her other brother had previously died of pancreatic cancer and she reminded me that she has had her father, 2 aunts, and 3 uncles that have died, presumably from cancer. She will come  back in 1 month with CBC, CMP, and CA 19-9 to see [facility] and in 2 months with CBC, CMP, and CA 19-9 to see me, hopefully with a biopsy result.. She  denies signs of infections such as sore throat, sinus drainage, cough or urinary symptoms. She  denies fever or recurrent chills. She  also deny nausea, vomiting, chest pain dyspnea or cough. Her  appetite is good and Her  weight has increased 1 pounds over last 1 month      REVIEW OF SYSTEMS:  Review of Systems  Constitutional:  Negative for appetite change, chills, diaphoresis, fatigue, fever and unexpected weight change.  HENT:   Negative for hearing loss, lump/mass, mouth sores, nosebleeds, sore throat, tinnitus, trouble swallowing and voice change.   Eyes: Negative.  Negative for eye problems and icterus.  Respiratory: Negative.  Negative for chest tightness, cough, hemoptysis, shortness of breath and wheezing.   Cardiovascular: Negative.  Negative for chest pain, leg swelling and palpitations.  Gastrointestinal:  Negative for abdominal distention, abdominal pain, blood in stool, constipation, diarrhea, nausea, rectal pain and vomiting.  Endocrine: Negative.  Negative for hot flashes.  Genitourinary: Negative.  Negative for bladder incontinence, difficulty urinating, dyspareunia, dysuria, frequency, hematuria, menstrual problem, nocturia, pelvic pain, vaginal bleeding and vaginal discharge.   Musculoskeletal:  Positive for arthralgias (right hip since a fall) and gait problem (limping due to previous stroke, has worsened). Negative for back pain, flank pain, myalgias, neck pain and neck stiffness.       Bones hurt  Skin: Negative.  Negative for itching, rash and wound.       Large abrasions on both knee L>R from  fall.  Neurological:  Positive for extremity weakness and gait problem (limping due to previous stroke, has worsened). Negative for dizziness, headaches, light-headedness, numbness, seizures and speech difficulty.  Hematological:  Negative for adenopathy. Bruises/bleeds easily.  Psychiatric/Behavioral:  Positive for depression. Negative for confusion, decreased concentration, sleep disturbance and suicidal ideas. The patient is not nervous/anxious.      VITALS:  Blood pressure (!) 203/96, pulse 80, temperature 97.9 F (36.6 C), temperature source Oral, resp. rate 16, height 5\' 4"  (1.626 m), weight 142 lb 6.4 oz (64.6 kg), SpO2 96%.  Wt Readings from Last 3 Encounters:  02/15/23 143 lb 2 oz (64.9 kg)  02/14/23 142 lb 6.4 oz (64.6 kg)  01/13/23 141 lb 1.3 oz (64 kg)    Body mass index is 24.44 kg/m.  Performance status (ECOG): 1 - Symptomatic but completely ambulatory  PHYSICAL EXAM:  Physical Exam Vitals and nursing note reviewed.  Constitutional:      General: She is not in acute distress.    Appearance: Normal appearance. She is normal weight. She is not ill-appearing, toxic-appearing or diaphoretic.  HENT:     Head: Normocephalic and atraumatic.     Right Ear: Tympanic membrane, ear canal and external ear normal. There is no impacted cerumen.     Left Ear: Tympanic membrane, ear canal and external ear normal. There is no impacted cerumen.     Nose: Nose normal. No congestion or rhinorrhea.     Mouth/Throat:     Mouth: Mucous membranes are moist.     Pharynx: Oropharynx is clear. No oropharyngeal exudate or posterior oropharyngeal erythema.  Eyes:     General: No scleral icterus.       Right eye: No discharge.        Left eye: No discharge.     Extraocular Movements: Extraocular movements intact.     Conjunctiva/sclera: Conjunctivae normal.     Pupils: Pupils are equal, round, and reactive to light.  Neck:     Vascular: No carotid bruit.  Cardiovascular:     Rate and  Rhythm: Normal rate and regular rhythm.     Pulses: Normal pulses.     Heart sounds: Normal heart sounds. No murmur heard.    No friction rub. No gallop.  Pulmonary:     Effort: Pulmonary effort is normal. No respiratory distress.     Breath sounds: Normal breath sounds. No stridor. No wheezing, rhonchi or rales.  Chest:     Chest wall: No tenderness.  Abdominal:     General: Bowel sounds are normal. There is no distension.     Palpations: Abdomen is soft. There is no hepatomegaly, splenomegaly or mass.     Tenderness: There is no abdominal tenderness. There is no right CVA tenderness, left CVA tenderness, guarding or rebound.     Hernia: No hernia is present.     Comments:     Musculoskeletal:        General: No swelling, tenderness, deformity or signs of injury. Normal range of motion.     Cervical back: Normal range of motion and neck supple. No rigidity or tenderness.     Right lower leg: No edema.     Left lower leg: No edema.  Lymphadenopathy:     Cervical: No cervical adenopathy.     Upper Body:     Right upper body: No supraclavicular or axillary adenopathy.     Left upper body: No supraclavicular or axillary adenopathy.     Lower Body: No right inguinal  adenopathy. No left inguinal adenopathy.  Skin:    General: Skin is warm and dry.     Coloration: Skin is not jaundiced or pale.     Findings: No abrasion, bruising, erythema, lesion or rash.     Comments:    Neurological:     General: No focal deficit present.     Mental Status: She is alert and oriented to person, place, and time. Mental status is at baseline.     Cranial Nerves: No cranial nerve deficit.     Sensory: No sensory deficit.     Motor: No weakness.     Coordination: Coordination normal.     Gait: Gait normal.     Deep Tendon Reflexes: Reflexes normal.  Psychiatric:        Mood and Affect: Mood normal.        Behavior: Behavior normal.        Thought Content: Thought content normal.        Judgment:  Judgment normal.    LABS:      Latest Ref Rng & Units 02/14/2023   10:38 AM 01/13/2023    9:06 AM 12/15/2022    9:10 AM  CBC  WBC 4.0 - 10.5 K/uL 2.8  2.6  3.3   Hemoglobin 12.0 - 15.0 g/dL 82.9  9.8  9.7   Hematocrit 36.0 - 46.0 % 32.5  30.3  30.1   Platelets 150 - 400 K/uL 265  273  269       Latest Ref Rng & Units 02/14/2023   10:38 AM 01/13/2023    9:06 AM 12/15/2022    9:10 AM  CMP  Glucose 70 - 99 mg/dL 562  130  865   BUN 6 - 20 mg/dL 9  8  8    Creatinine 0.44 - 1.00 mg/dL 7.84  6.96  2.95   Sodium 135 - 145 mmol/L 137  139  137   Potassium 3.5 - 5.1 mmol/L 2.8  2.6  2.9   Chloride 98 - 111 mmol/L 98  99  100   CO2 22 - 32 mmol/L 30  31  29    Calcium 8.9 - 10.3 mg/dL 8.7  8.6  8.6   Total Protein 6.5 - 8.1 g/dL 7.0  7.0  7.0   Total Bilirubin 0.3 - 1.2 mg/dL 0.6  0.7  0.7   Alkaline Phos 38 - 126 U/L 69  65  66   AST 15 - 41 U/L 21  17  20    ALT 0 - 44 U/L 23  21  23     Lab Results  Component Value Date   CEA1 1.9 11/16/2022   /  CEA  Date Value Ref Range Status  11/16/2022 1.9 0.0 - 4.7 ng/mL Final    Comment:    (NOTE)                             Nonsmokers          <3.9                             Smokers             <5.6 [employer] Electrochemiluminescence Immunoassay (ECLIA) Values obtained with different assay methods or kits cannot be used interchangeably.  Results cannot be interpreted as absolute evidence of the presence or absence of malignant disease. Performed At: [facility]  [place] [address] [place], [place] [id number] Jolene Schimke MD BM:[id number]    No results found for: "PSA1" Lab Results  Component Value Date   CAN199 56 (H) 02/14/2023   No results found for: "CAN125"  No results found for: "TOTALPROTELP", "ALBUMINELP", "A1GS", "A2GS", "BETS", "BETA2SER", "GAMS", "MSPIKE", "SPEI" Lab Results  Component Value Date   TIBC 259 08/08/2022   TIBC 301 11/09/2021   FERRITIN 134 08/08/2022   FERRITIN 59 11/09/2021    IRONPCTSAT 42 08/08/2022   IRONPCTSAT 26 11/09/2021   Component Ref Range & Units 11/09/2022 3 mo ago  Cholesterol, Total 100 - 199 mg/dL 272 536  Triglycerides 0 - 149 mg/dL 79 82  HDL >64 mg/dL 38 Low  38 Low   VLDL Cholesterol Cal 5 - 40 mg/dL 15 16  LDL Chol Calc (NIH) 0 - 99 mg/dL 403 High  95  Chol/HDL Ratio 0.0 - 4.4 ratio 4.4 3.9 CM   Component Ref Range & Units 11/09/2022 3 mo ago  Hgb A1c MFr Bld 4.8 - 5.6 % 7.5 High  7.7 High  CM  Est. average glucose Bld gHb Est-mCnc mg/dL 474 259   Component Ref Range & Units 08/08/2022  Vitamin B-12 232 - 1,245 pg/mL 307   Component Ref Range & Units 08/08/2022  TSH 0.450 - 4.500 uIU/mL 3.380    No results found for: "LDH"  STUDIES:           HISTORY:   Past Medical History:  Diagnosis Date   Acid reflux    Anxiety    Carpal tunnel syndrome    Cerebrovascular disease    Diabetic retinopathy (HCC)    Family history of pancreatic cancer 08/09/2021   Gait abnormality    History of stroke    Hyperlipidemia    Hypertension    Migraine    Pancreatic cyst 08/09/2021   Retinal detachment    Thrombocytosis    Trigger finger    Ulnar nerve entrapment     Past Surgical History:  Procedure Laterality Date   CESAREAN SECTION  1997   GALLBLADDER SURGERY  2011    Family History  Problem Relation Age of Onset   Diabetes Mother    Cancer Mother    Cancer Father    Healthy Child      Social History:  reports that she has never smoked. She has never used smokeless tobacco. She reports that she does not drink alcohol and does not use drugs.The patient is alone today.  Allergies:  Allergies  Allergen Reactions   Iodinated Contrast Media Other (See Comments) and Shortness Of Breath   Morphine Itching and Other (See Comments)    "Makes me feel out of my mind" per pt.    Current Medications: Current Outpatient Medications  Medication Sig Dispense Refill   ALPRAZolam (XANAX) 0.5 MG tablet Take 1  tablet (0.5 mg total) by mouth every 8 (eight) hours as needed for anxiety. 90 tablet 2   amLODipine (NORVASC) 10 MG tablet Take 1 tablet (10 mg total) by mouth daily. 90 tablet 0   aspirin 81 MG chewable tablet Chew 1 tablet by mouth daily.     atorvastatin (LIPITOR) 40 MG tablet Take 1 tablet (40 mg total) by mouth at bedtime. 90 tablet 1   carvedilol (COREG) 12.5 MG tablet TAKE 1 TABLET BY MOUTH 2 TIMES DAILY. 180 tablet 0   clopidogrel (PLAVIX) 75 MG tablet Take 1 tablet (75 mg total) by mouth daily. 90 tablet 0  diclofenac Sodium (VOLTAREN) 1 % GEL Apply 4 g topically 4 (four) times daily.     doxycycline (MONODOX) 100 MG capsule Take 1 capsule (100 mg total) by mouth 2 (two) times daily. 20 capsule 0   enalapril (VASOTEC) 20 MG tablet Take 20 mg by mouth 2 (two) times daily.     fluticasone (FLONASE) 50 MCG/ACT nasal spray PLACE 1 SPRAY INTO BOTH NOSTRILS IN THE MORNING AND AT BEDTIME. 48 mL 1   fluticasone (FLOVENT HFA) 44 MCG/ACT inhaler      glimepiride (AMARYL) 2 MG tablet Take by mouth.     ibuprofen (ADVIL,MOTRIN) 800 MG tablet Take 800 mg by mouth every 8 (eight) hours as needed.     Insulin Detemir (LEVEMIR) 100 UNIT/ML Pen Inject into the skin.     Insulin Pen Needle (BD PEN NEEDLE NANO 2ND GEN) 32G X 4 MM MISC Check blood sugar three times a day 100 each 3   lidocaine (XYLOCAINE) 1 % (with preservative) injection by Infiltration route.     metFORMIN (GLUCOPHAGE) 1000 MG tablet Take 1 tablet (1,000 mg total) by mouth 2 (two) times daily with a meal. 180 tablet 0   metoCLOPramide (REGLAN) 5 MG tablet Take 5 mg by mouth every 8 (eight) hours.     NOVOLOG FLEXPEN 100 UNIT/ML FlexPen INJECT 34 UNIT(S) INTO THE SKIN 3 TIMES DAILY BEFORE MEALS 15 mL 3   ondansetron (ZOFRAN) 4 MG tablet Take 1 tablet (4 mg total) by mouth every 8 (eight) hours as needed for nausea or vomiting. 20 tablet 0   Oxycodone HCl 10 MG TABS Take 1 tablet (10 mg total) by mouth every 4 (four) hours as needed. 120  tablet 0   pantoprazole (PROTONIX) 40 MG tablet Take 1 tablet (40 mg total) by mouth daily. 30 tablet 6   Potassium Chloride ER 20 MEQ TBCR TAKE 1 TABLET (20 MEQ TOTAL) BY MOUTH IN THE MORNING, AT NOON, AND AT BEDTIME. 270 tablet 2   prochlorperazine (COMPAZINE) 10 MG tablet Take 10 mg by mouth every 6 (six) hours as needed for nausea or vomiting.     spironolactone (ALDACTONE) 25 MG tablet Take 0.5 tablets by mouth daily.     TOUJEO MAX SOLOSTAR 300 UNIT/ML Solostar Pen SMARTSIG:80 Unit(s) SUB-Q Every Night 300 mL 1   triamcinolone acetonide (TRIESENCE) 40 MG/ML SUSP Inject into the articular space.     TRULICITY 4.5 MG/0.5ML SOPN SMARTSIG:1 Pre-Filled Pen Syringe SUB-Q Once a Week     No current facility-administered medications for this visit.      I,Oluwatobi Asade,acting as a scribe for Dellia Beckwith, MD.,have documented all relevant documentation on the behalf of Dellia Beckwith, MD,as directed by  Dellia Beckwith, MD while in the presence of Dellia Beckwith, MD.

## 2023-02-08 NOTE — Anesthesia Preprocedure Evaluation (Deleted)
Anesthesia Evaluation    Reviewed: Allergy & Precautions, Patient's Chart, lab work & pertinent test results  Airway        Dental   Pulmonary           Cardiovascular hypertension, Pt. on medications      Neuro/Psych  Headaches PSYCHIATRIC DISORDERS Anxiety     CVA (L sided weakness), Residual Symptoms    GI/Hepatic ,GERD  ,,  Endo/Other  diabetes, Type 2, Oral Hypoglycemic Agents    Renal/GU      Musculoskeletal   Abdominal   Peds  Hematology   Anesthesia Other Findings All: iodine contrast  Reproductive/Obstetrics                              Anesthesia Physical Anesthesia Plan  ASA: 3  Anesthesia Plan: MAC   Post-op Pain Management: Minimal or no pain anticipated   Induction:   PONV Risk Score and Plan: Treatment may vary due to age or medical condition and Propofol infusion  Airway Management Planned: Natural Airway and Nasal Cannula  Additional Equipment: None  Intra-op Plan:   Post-operative Plan:   Informed Consent:      Dental advisory given  Plan Discussed with:   Anesthesia Plan Comments: (Pancreatic cyst)       Anesthesia Quick Evaluation

## 2023-02-09 ENCOUNTER — Encounter (HOSPITAL_COMMUNITY): Payer: Self-pay | Admitting: Gastroenterology

## 2023-02-09 ENCOUNTER — Encounter (HOSPITAL_COMMUNITY)
Admission: RE | Disposition: A | Discharge status: Home / Self Care | Payer: Self-pay | Source: Home / Self Care | Attending: Gastroenterology

## 2023-02-09 ENCOUNTER — Ambulatory Visit (HOSPITAL_COMMUNITY)
Admission: RE | Admit: 2023-02-09 | Discharge: 2023-02-09 | Disposition: A | Discharge status: Home / Self Care | Payer: Medicare Other | Attending: Gastroenterology | Admitting: Gastroenterology

## 2023-02-09 DIAGNOSIS — Z7902 Long term (current) use of antithrombotics/antiplatelets: Secondary | ICD-10-CM | POA: Diagnosis not present

## 2023-02-09 DIAGNOSIS — K862 Cyst of pancreas: Secondary | ICD-10-CM | POA: Insufficient documentation

## 2023-02-09 DIAGNOSIS — Z7985 Long-term (current) use of injectable non-insulin antidiabetic drugs: Secondary | ICD-10-CM | POA: Diagnosis not present

## 2023-02-09 DIAGNOSIS — Z539 Procedure and treatment not carried out, unspecified reason: Secondary | ICD-10-CM | POA: Diagnosis not present

## 2023-02-09 SURGERY — CANCELLED PROCEDURE
Anesthesia: Monitor Anesthesia Care

## 2023-02-09 MED ORDER — SODIUM CHLORIDE 0.9 % IV SOLN: INTRAVENOUS | Status: DC

## 2023-02-09 MED ORDER — PROPOFOL 1000 MG/100ML IV EMUL
INTRAVENOUS | Status: AC
  Filled 2023-02-09: qty 100

## 2023-02-09 MED ORDER — LACTATED RINGERS IV SOLN: INTRAVENOUS | Status: DC

## 2023-02-09 NOTE — Progress Notes (Signed)
Patient evaluated in the preprocedure area of the endoscopy unit. Case discussed with anesthesia. Patient's blood pressures in the 230s to 240s systolic. She has no chest pain or chest pressure or headaches. She has not taken any of her blood pressure medication for a week. With the patient being on Trulicity she also took that within this week when it should have been held for a week. She is at increased risk of procedural anesthesia complications, as such is felt that she needs to be rescheduled and be taking her medications on a regular basis. Will go over with her anesthesia team exactly which medications she should be taking in which she should not. She can restart Plavix today. Will hold her Plavix for 5 days before her rescheduled procedure in the coming weeks. Discussed this in great detail with the patient and patient's husband. They understand that she should take her blood pressure medications today and recheck her blood pressure this afternoon as long as her blood pressures are under 200s and she is not having any symptoms of chest pain chest pressure shortness of breath new headache then she can just continue on her current medications. Should she develop any of the symptoms +/- with blood pressures above or below 200, she would need to go to the emergency room.  Patient canceled for today.  Corliss Parish, MD [facility] Office # [phone]

## 2023-02-09 NOTE — Progress Notes (Signed)
Pt arrived to department for procedure. BP 237/85 on arrival. Pt asymptomatic and states 'it's always high like that'. Pt reports not having taken usual BP meds for a week in preparation for procedure. Pt also not stopped Trulicity for adequate amount of time. Discussed with anesthetist and Dr Meridee Score. Procedure canceled for today. Pt to go home and take usual medications and cont monitor BP at home. Dr Meridee Score gave pt instructions to follow up BP with primary care physician or present to emergency if not improved.

## 2023-02-10 ENCOUNTER — Ambulatory Visit: Payer: Medicare Other | Admitting: Internal Medicine

## 2023-02-14 ENCOUNTER — Inpatient Hospital Stay: Payer: Medicare Other | Attending: Hematology and Oncology | Admitting: Oncology

## 2023-02-14 ENCOUNTER — Inpatient Hospital Stay: Payer: Medicare Other

## 2023-02-14 ENCOUNTER — Telehealth: Payer: Self-pay | Admitting: Internal Medicine

## 2023-02-14 ENCOUNTER — Other Ambulatory Visit: Payer: Self-pay | Admitting: Oncology

## 2023-02-14 ENCOUNTER — Telehealth: Payer: Self-pay

## 2023-02-14 ENCOUNTER — Encounter: Payer: Self-pay | Admitting: Oncology

## 2023-02-14 ENCOUNTER — Telehealth: Payer: Self-pay | Admitting: Oncology

## 2023-02-14 VITALS — BP 203/96 | HR 80 | Temp 97.9°F | Resp 16 | Ht 64.0 in | Wt 142.4 lb

## 2023-02-14 DIAGNOSIS — R978 Other abnormal tumor markers: Secondary | ICD-10-CM

## 2023-02-14 DIAGNOSIS — K222 Esophageal obstruction: Secondary | ICD-10-CM | POA: Insufficient documentation

## 2023-02-14 DIAGNOSIS — K635 Polyp of colon: Secondary | ICD-10-CM | POA: Diagnosis not present

## 2023-02-14 DIAGNOSIS — D649 Anemia, unspecified: Secondary | ICD-10-CM | POA: Insufficient documentation

## 2023-02-14 DIAGNOSIS — K862 Cyst of pancreas: Secondary | ICD-10-CM

## 2023-02-14 DIAGNOSIS — Z800 Family history of malignant neoplasm of digestive organs: Secondary | ICD-10-CM | POA: Insufficient documentation

## 2023-02-14 DIAGNOSIS — D72819 Decreased white blood cell count, unspecified: Secondary | ICD-10-CM | POA: Insufficient documentation

## 2023-02-14 DIAGNOSIS — G8929 Other chronic pain: Secondary | ICD-10-CM | POA: Insufficient documentation

## 2023-02-14 DIAGNOSIS — D473 Essential (hemorrhagic) thrombocythemia: Secondary | ICD-10-CM | POA: Diagnosis not present

## 2023-02-14 LAB — CBC WITH DIFFERENTIAL (CANCER CENTER ONLY)
Abs Immature Granulocytes: 0.01 10*3/uL (ref 0.00–0.07)
Basophils Absolute: 0 10*3/uL (ref 0.0–0.1)
Basophils Relative: 0 %
Eosinophils Absolute: 0.1 10*3/uL (ref 0.0–0.5)
Eosinophils Relative: 3 %
HCT: 32.5 % — ABNORMAL LOW (ref 36.0–46.0)
Hemoglobin: 10.4 g/dL — ABNORMAL LOW (ref 12.0–15.0)
Immature Granulocytes: 0 %
Lymphocytes Relative: 26 %
Lymphs Abs: 0.7 10*3/uL (ref 0.7–4.0)
MCH: 30.8 pg (ref 26.0–34.0)
MCHC: 32 g/dL (ref 30.0–36.0)
MCV: 96.2 fL (ref 80.0–100.0)
Monocytes Absolute: 0.1 10*3/uL (ref 0.1–1.0)
Monocytes Relative: 5 %
Neutro Abs: 1.9 10*3/uL (ref 1.7–7.7)
Neutrophils Relative %: 66 %
Platelet Count: 265 10*3/uL (ref 150–400)
RBC: 3.38 MIL/uL — ABNORMAL LOW (ref 3.87–5.11)
RDW: 15.3 % (ref 11.5–15.5)
WBC Count: 2.8 10*3/uL — ABNORMAL LOW (ref 4.0–10.5)
nRBC: 0 % (ref 0.0–0.2)

## 2023-02-14 LAB — CMP (CANCER CENTER ONLY)
ALT: 23 U/L (ref 0–44)
AST: 21 U/L (ref 15–41)
Albumin: 3.9 g/dL (ref 3.5–5.0)
Alkaline Phosphatase: 69 U/L (ref 38–126)
Anion gap: 9 (ref 5–15)
BUN: 9 mg/dL (ref 6–20)
CO2: 30 mmol/L (ref 22–32)
Calcium: 8.7 mg/dL — ABNORMAL LOW (ref 8.9–10.3)
Chloride: 98 mmol/L (ref 98–111)
Creatinine: 0.81 mg/dL (ref 0.44–1.00)
GFR, Estimated: >60 mL/min (ref >60)
Glucose, Bld: 302 mg/dL — ABNORMAL HIGH (ref 70–99)
Potassium: 2.8 mmol/L — ABNORMAL LOW (ref 3.5–5.1)
Sodium: 137 mmol/L (ref 135–145)
Total Bilirubin: 0.6 mg/dL (ref 0.3–1.2)
Total Protein: 7 g/dL (ref 6.5–8.1)

## 2023-02-14 NOTE — Telephone Encounter (Signed)
Dr. Lavonda Jumbo Office called to record that pts BP was 203/96 at her appt on 02/14/2023.

## 2023-02-14 NOTE — Telephone Encounter (Signed)
 Patient has been scheduled. Aware of appt date and time  

## 2023-02-14 NOTE — Telephone Encounter (Signed)
Called Dr. Debbe Bales office and spoke to one of the nurses regarding patient BP issue. Informed them that the patients BP was 203/96 and 201/97.

## 2023-02-15 ENCOUNTER — Ambulatory Visit: Payer: Medicare Other | Admitting: Student

## 2023-02-15 ENCOUNTER — Other Ambulatory Visit: Payer: Self-pay

## 2023-02-15 ENCOUNTER — Encounter: Payer: Self-pay | Admitting: Student

## 2023-02-15 ENCOUNTER — Other Ambulatory Visit: Payer: Self-pay | Admitting: Student

## 2023-02-15 VITALS — BP 128/80 | HR 80 | Temp 97.7°F | Resp 18 | Ht 64.0 in | Wt 143.1 lb

## 2023-02-15 DIAGNOSIS — I679 Cerebrovascular disease, unspecified: Secondary | ICD-10-CM

## 2023-02-15 DIAGNOSIS — G894 Chronic pain syndrome: Secondary | ICD-10-CM

## 2023-02-15 DIAGNOSIS — R161 Splenomegaly, not elsewhere classified: Secondary | ICD-10-CM

## 2023-02-15 DIAGNOSIS — E11319 Type 2 diabetes mellitus with unspecified diabetic retinopathy without macular edema: Secondary | ICD-10-CM

## 2023-02-15 DIAGNOSIS — I10 Essential (primary) hypertension: Secondary | ICD-10-CM

## 2023-02-15 DIAGNOSIS — M898X9 Other specified disorders of bone, unspecified site: Secondary | ICD-10-CM

## 2023-02-15 LAB — CANCER ANTIGEN 19-9: CA 19-9: 56 U/mL — ABNORMAL HIGH (ref 0–35)

## 2023-02-15 MED ORDER — METFORMIN HCL 1000 MG PO TABS: 1000.0000 mg | ORAL_TABLET | Freq: Two times a day (BID) | ORAL | 0 refills | Status: DC

## 2023-02-15 MED ORDER — AMLODIPINE BESYLATE 10 MG PO TABS: 10.0000 mg | ORAL_TABLET | Freq: Every day | ORAL | 0 refills | Status: DC

## 2023-02-15 MED ORDER — CARVEDILOL 12.5 MG PO TABS: 12.5000 mg | ORAL_TABLET | Freq: Two times a day (BID) | ORAL | 0 refills | Status: DC

## 2023-02-15 MED ORDER — CLOPIDOGREL BISULFATE 75 MG PO TABS: 75.0000 mg | ORAL_TABLET | Freq: Every day | ORAL | 0 refills | Status: DC

## 2023-02-15 MED ORDER — OXYCODONE HCL 10 MG PO TABS
10.0000 mg | ORAL_TABLET | ORAL | 0 refills | Status: DC | PRN
Start: 2023-02-15 — End: 2023-03-15

## 2023-02-15 NOTE — Progress Notes (Signed)
Established Patient Office Visit  Subjective   Patient ID: Allison Stevens, female    DOB: 07/02/1966  Age: 57 y.o. MRN: [id number]  Chief Complaint  Patient presents with   Follow-up    Allison Stevens is a 57 year old female who presents for hypertension. The patient was scheduled for a pancreatic biopsy on 02/09/23 however, the procedure was canceled due to her blood pressures being elevated. It was noted that her blood pressure were 230s to 240s systolic at that time.  The patient does have a history of stroke and CVA.  She reported that she was not taking her blood pressure medication. When asked why, she states "they told me to take this and stop taking that."  Please refer to the op note.  Today the patient states "I am out of everything." I have attempted to do a medication recall with the patient but she did not bring any medication or a list to this visit.  She is also unable to tell me the dosages but knows that she needs refills of certain medications.  Due to this miscommunication and series of events, I have asked the patient if it would be beneficial to have someone assist her with her medications; she declined.   She is normally seen by my attending Dr. Leonia Reader. She needs a annual physical.      Review of Systems  Constitutional: Negative.   Eyes: Negative.   Respiratory: Negative.    Cardiovascular: Negative.   Gastrointestinal: Negative.   Genitourinary: Negative.   Musculoskeletal:  Positive for falls and joint pain.  Neurological:  Positive for weakness.  Psychiatric/Behavioral: Negative.        Objective:     BP 128/80 (BP Location: Left Arm, Patient Position: Sitting, Cuff Size: Normal)   Pulse 80   Temp 97.7 F (36.5 C)   Resp 18   Ht 5\' 4"  (1.626 m)   Wt 143 lb 2 oz (64.9 kg)   SpO2 97%   BMI 24.57 kg/m    Physical Exam Constitutional:      Appearance: Normal appearance.  Cardiovascular:     Rate and Rhythm: Normal rate  and regular rhythm.     Pulses: Normal pulses.     Heart sounds: Normal heart sounds. No murmur heard. Pulmonary:     Effort: Pulmonary effort is normal.     Breath sounds: Normal breath sounds.  Abdominal:     General: Bowel sounds are normal. There is no distension.     Palpations: Abdomen is soft.  Musculoskeletal:        General: Normal range of motion.  Skin:    General: Skin is warm and dry.     Capillary Refill: Capillary refill takes less than 2 seconds.  Neurological:     Mental Status: She is alert. Mental status is at baseline.     Cranial Nerves: Cranial nerve deficit present.     Motor: Weakness present.     Coordination: Coordination abnormal.     Gait: Gait abnormal.  Psychiatric:        Mood and Affect: Mood normal.       Assessment & Plan:   Problem List Items Addressed This Visit     Diabetes (HCC)    There is a discrepancy in what she believes that she is supposed to take.  As of now I will refill the metformin.  The patient should return in 2 weeks for annual visit with  fasting labs.  After that point we can decide which medication she should actually be taking.      Relevant Medications   metFORMIN (GLUCOPHAGE) 1000 MG tablet   Cerebrovascular disease    I have refilled Plavix.      Relevant Medications   amLODipine (NORVASC) 10 MG tablet   carvedilol (COREG) 12.5 MG tablet   Essential hypertension - Primary    I will refill all blood pressure medications today.  I have stressed the importance of the patient monitoring her blood pressure at least twice daily and taking the medication.      Relevant Medications   amLODipine (NORVASC) 10 MG tablet   carvedilol (COREG) 12.5 MG tablet    Return in about 2 weeks (around 03/01/2023) for annual fasting labs. Needs to bring her medications or a list. .    Allison Blow, NP

## 2023-02-16 NOTE — Assessment & Plan Note (Deleted)
There is a discrepancy in what she believes that she is supposed to take.  As of now I will refill the metformin.  The patient should return in 2 weeks for annual visit with fasting labs.  After that point we can decide which medication she should actually be taking.

## 2023-02-16 NOTE — Assessment & Plan Note (Addendum)
I have refilled Plavix.

## 2023-02-16 NOTE — Assessment & Plan Note (Signed)
There is a discrepancy in what she believes that she is supposed to take.  As of now I will refill the metformin.  The patient should return in 2 weeks for annual visit with fasting labs.  After that point we can decide which medication she should actually be taking.

## 2023-02-16 NOTE — Assessment & Plan Note (Signed)
I will refill all blood pressure medications today.  I have stressed the importance of the patient monitoring her blood pressure at least twice daily and taking the medication.

## 2023-02-17 ENCOUNTER — Telehealth: Payer: Self-pay

## 2023-02-17 NOTE — Telephone Encounter (Signed)
-----   Message from Dellia Beckwith sent at 02/16/2023  1:50 PM EDT ----- Regarding: call Tell her K is dangerously low, needs to take potassium 4 pills daily!!.  We can give her IV if she would like. BS too high at 302.  White cell and red cells a little better but still low, and platelets are normal. Stay off the Hydroxyurea

## 2023-02-17 NOTE — Telephone Encounter (Signed)
Patient was advised to take 4 potassium daily. She wants to take tablets for now before doing IV potassium. She will work on getting her blood sugars down and will not take her Hjydroxyurea for now.

## 2023-02-20 ENCOUNTER — Encounter: Payer: Self-pay | Admitting: Oncology

## 2023-02-28 ENCOUNTER — Telehealth: Payer: Self-pay

## 2023-02-28 ENCOUNTER — Other Ambulatory Visit: Payer: Self-pay

## 2023-02-28 DIAGNOSIS — K862 Cyst of pancreas: Secondary | ICD-10-CM

## 2023-02-28 NOTE — Telephone Encounter (Signed)
-----   Message from [facility] sent at 02/28/2023  6:19 AM EDT ----- Regarding: Follow-up RG, This patient had to be canceled due to excessive blood pressures. Will try to get her rescheduled in the next 2 or 3 months. Thanks. GM  Kruti Horacek, Please offer this patient a procedure in October or November so that her blood pressure is controlled. Thanks. GM

## 2023-02-28 NOTE — Telephone Encounter (Signed)
EUS has been set up for 10/10 at 945 am at [facility] with GM

## 2023-03-01 ENCOUNTER — Encounter: Payer: Self-pay | Admitting: Oncology

## 2023-03-01 NOTE — Telephone Encounter (Signed)
Line rings no answer no voice mail  °

## 2023-03-02 NOTE — Telephone Encounter (Signed)
Line rings no answer on home number Voice mail full on cell   All information has been mailed to the pt home.  Unable to reach by phone.

## 2023-03-04 ENCOUNTER — Other Ambulatory Visit: Payer: Self-pay | Admitting: Internal Medicine

## 2023-03-14 ENCOUNTER — Ambulatory Visit: Payer: Medicare Other | Admitting: Hematology and Oncology

## 2023-03-14 ENCOUNTER — Other Ambulatory Visit: Payer: Medicare Other

## 2023-03-15 ENCOUNTER — Other Ambulatory Visit: Payer: Self-pay

## 2023-03-15 ENCOUNTER — Other Ambulatory Visit: Payer: Self-pay | Admitting: Internal Medicine

## 2023-03-15 DIAGNOSIS — R161 Splenomegaly, not elsewhere classified: Secondary | ICD-10-CM

## 2023-03-15 DIAGNOSIS — G894 Chronic pain syndrome: Secondary | ICD-10-CM

## 2023-03-15 DIAGNOSIS — M898X9 Other specified disorders of bone, unspecified site: Secondary | ICD-10-CM

## 2023-03-15 MED ORDER — ALPRAZOLAM 0.5 MG PO TABS: 0.5000 mg | ORAL_TABLET | Freq: Three times a day (TID) | ORAL | 2 refills | Status: DC | PRN

## 2023-03-15 MED ORDER — OXYCODONE HCL 10 MG PO TABS
10.0000 mg | ORAL_TABLET | ORAL | 0 refills | Status: DC | PRN
Start: 2023-03-15 — End: 2023-04-12

## 2023-03-16 ENCOUNTER — Inpatient Hospital Stay: Payer: Medicare Other | Admitting: Hematology and Oncology

## 2023-03-16 ENCOUNTER — Encounter: Payer: Self-pay | Admitting: Oncology

## 2023-03-16 ENCOUNTER — Inpatient Hospital Stay: Payer: Medicare Other

## 2023-03-24 ENCOUNTER — Encounter: Payer: Self-pay | Admitting: Internal Medicine

## 2023-03-28 ENCOUNTER — Other Ambulatory Visit: Payer: Self-pay

## 2023-03-28 DIAGNOSIS — K862 Cyst of pancreas: Secondary | ICD-10-CM

## 2023-03-28 NOTE — Telephone Encounter (Signed)
MRI MRCP order entered  Left message on machine to call back

## 2023-03-29 ENCOUNTER — Inpatient Hospital Stay: Payer: Medicare Other | Admitting: Internal Medicine

## 2023-03-29 NOTE — Telephone Encounter (Signed)
The pt agrees to the MRI - order has been entered and sent to the schedulers

## 2023-04-11 ENCOUNTER — Inpatient Hospital Stay (INDEPENDENT_AMBULATORY_CARE_PROVIDER_SITE_OTHER): Payer: Medicare Other | Admitting: Oncology

## 2023-04-11 ENCOUNTER — Ambulatory Visit: Payer: Medicare Other | Admitting: Oncology

## 2023-04-11 ENCOUNTER — Telehealth: Payer: Self-pay | Admitting: Oncology

## 2023-04-11 ENCOUNTER — Inpatient Hospital Stay: Payer: Medicare Other | Attending: Hematology and Oncology

## 2023-04-11 ENCOUNTER — Telehealth: Payer: Self-pay

## 2023-04-11 VITALS — BP 167/89 | HR 81 | Temp 98.7°F | Resp 16 | Ht 64.0 in | Wt 140.0 lb

## 2023-04-11 DIAGNOSIS — E11319 Type 2 diabetes mellitus with unspecified diabetic retinopathy without macular edema: Secondary | ICD-10-CM

## 2023-04-11 DIAGNOSIS — Z800 Family history of malignant neoplasm of digestive organs: Secondary | ICD-10-CM

## 2023-04-11 DIAGNOSIS — I679 Cerebrovascular disease, unspecified: Secondary | ICD-10-CM | POA: Diagnosis not present

## 2023-04-11 DIAGNOSIS — E876 Hypokalemia: Secondary | ICD-10-CM | POA: Insufficient documentation

## 2023-04-11 DIAGNOSIS — D378 Neoplasm of uncertain behavior of other specified digestive organs: Secondary | ICD-10-CM | POA: Diagnosis not present

## 2023-04-11 DIAGNOSIS — Z09 Encounter for follow-up examination after completed treatment for conditions other than malignant neoplasm: Secondary | ICD-10-CM | POA: Insufficient documentation

## 2023-04-11 DIAGNOSIS — K862 Cyst of pancreas: Secondary | ICD-10-CM

## 2023-04-11 DIAGNOSIS — R978 Other abnormal tumor markers: Secondary | ICD-10-CM

## 2023-04-11 DIAGNOSIS — D473 Essential (hemorrhagic) thrombocythemia: Secondary | ICD-10-CM | POA: Diagnosis not present

## 2023-04-11 LAB — CBC WITH DIFFERENTIAL (CANCER CENTER ONLY)
Abs Immature Granulocytes: 0.01 10*3/uL (ref 0.00–0.07)
Basophils Absolute: 0 10*3/uL (ref 0.0–0.1)
Basophils Relative: 0 %
Eosinophils Absolute: 0 10*3/uL (ref 0.0–0.5)
Eosinophils Relative: 1 %
HCT: 31 % — ABNORMAL LOW (ref 36.0–46.0)
Hemoglobin: 10.1 g/dL — ABNORMAL LOW (ref 12.0–15.0)
Immature Granulocytes: 0 %
Lymphocytes Relative: 23 %
Lymphs Abs: 0.6 10*3/uL — ABNORMAL LOW (ref 0.7–4.0)
MCH: 31.3 pg (ref 26.0–34.0)
MCHC: 32.6 g/dL (ref 30.0–36.0)
MCV: 96 fL (ref 80.0–100.0)
Monocytes Absolute: 0.1 10*3/uL (ref 0.1–1.0)
Monocytes Relative: 5 %
Neutro Abs: 1.8 10*3/uL (ref 1.7–7.7)
Neutrophils Relative %: 71 %
Platelet Count: 224 10*3/uL (ref 150–400)
RBC: 3.23 MIL/uL — ABNORMAL LOW (ref 3.87–5.11)
RDW: 15.9 % — ABNORMAL HIGH (ref 11.5–15.5)
WBC Count: 2.6 10*3/uL — ABNORMAL LOW (ref 4.0–10.5)
nRBC: 0 % (ref 0.0–0.2)

## 2023-04-11 LAB — CMP (CANCER CENTER ONLY)
ALT: 27 U/L (ref 0–44)
AST: 26 U/L (ref 15–41)
Albumin: 3.8 g/dL (ref 3.5–5.0)
Alkaline Phosphatase: 70 U/L (ref 38–126)
Anion gap: 11 (ref 5–15)
BUN: 8 mg/dL (ref 6–20)
CO2: 27 mmol/L (ref 22–32)
Calcium: 8.6 mg/dL — ABNORMAL LOW (ref 8.9–10.3)
Chloride: 100 mmol/L (ref 98–111)
Creatinine: 0.9 mg/dL (ref 0.44–1.00)
GFR, Estimated: >60 mL/min (ref >60)
Glucose, Bld: 238 mg/dL — ABNORMAL HIGH (ref 70–99)
Potassium: 2.6 mmol/L — CL (ref 3.5–5.1)
Sodium: 138 mmol/L (ref 135–145)
Total Bilirubin: 0.8 mg/dL (ref 0.3–1.2)
Total Protein: 7.1 g/dL (ref 6.5–8.1)

## 2023-04-11 NOTE — Telephone Encounter (Signed)
Called Allison Stevens to give her Dr. Gerald Dexter recommendation on her critically low Potassium is to present to the emergency room for IV potassium.  She explained that she wasn't sure that she could go since her spouse had a recent MI and unable to do anything on his own since coming home from hospital.    I explained that she could to have a heart issue with her potassium being so low.  She responded with an "okay".    Dr. Angelene Giovanni aware.

## 2023-04-11 NOTE — Progress Notes (Signed)
CRITICAL VALUE STICKER  CRITICAL VALUE:  K+ 2.6  RECEIVER (on-site recipient of call):  Dyane Dustman RN  DATE & TIME NOTIFIED:   04/11/2023 @ 1407  MESSENGER (representative from lab):  [facility]  MD NOTIFIED:   Dr. Angelene Giovanni  TIME OF NOTIFICATION:  1428  RESPONSE:  Send patient to emergence room for IV potassium.

## 2023-04-11 NOTE — Telephone Encounter (Signed)
04/11/23 Next appt confirmed with patient

## 2023-04-11 NOTE — Progress Notes (Signed)
[facility] Cancer Initial Visit:  Patient Care Team: Crist Fat, MD as PCP - General (Internal Medicine) Dellia Beckwith, MD as Consulting Physician (Oncology) Drema Dallas, DO as Consulting Physician (Neurology)  CHIEF COMPLAINTS/PURPOSE OF CONSULTATION:  HISTORY OF PRESENTING ILLNESS: Allison Stevens 57 y.o. female is here because of  essential thrombocythemia which was diagnosed in January 2005.    April 11, 2023:  Patient currently on Hydrea 500 mg daily.  Dose of Hydrea has been steadily reduced over the last several months.  Is on ASA 81 mg daily.  No bleeding issues.   Husband recently had MI and is debilitated from this  WBC 2.6 hemoglobin 10.1 platelet count 224; 71 segs 23 lymphs 5 monos 1 EO CMP notable for potassium of 2.6 glucose 238 calcium 8.6 April 19 2023:  MRI abdomen  May 11 2023:  ERCP to evaluate pancreatic lesion.    Review of Systems  Constitutional:  Negative for appetite change, chills, fatigue, fever and unexpected weight change.       For the past 6 weeks has experienced drenching nightsweats  HENT:   Negative for mouth sores, sore throat, trouble swallowing and voice change.   Eyes:  Negative for eye problems and icterus.       Vision changes:  None  Respiratory:  Negative for chest tightness, cough, hemoptysis and wheezing.        DOE on walking < 100 feet  Cardiovascular:  Negative for chest pain, leg swelling and palpitations.       PND:  none Orthopnea:  none  Gastrointestinal:  Negative for blood in stool, constipation, diarrhea and vomiting.       Has epigastric pain radiates around ribs for several months.  Occasional nausea helped by antiemetics  Endocrine: Negative for hot flashes.       Cold intolerance:  none Heat intolerance:  none  Genitourinary:  Negative for bladder incontinence, difficulty urinating, dysuria, frequency, hematuria and nocturia.   Musculoskeletal:  Negative for back pain,  myalgias, neck pain and neck stiffness.       No new arthralgias.    Skin:  Negative for itching, rash and wound.  Neurological:  Positive for dizziness. Negative for extremity weakness, light-headedness, numbness, seizures and speech difficulty.       Falls frequently and uses walker to steady gait.  HA's 2 to 3 times a week  Hematological:  Negative for adenopathy. Does not bruise/bleed easily.  Psychiatric/Behavioral:  Negative for suicidal ideas. The patient is not nervous/anxious.        Stays up most of the night    MEDICAL HISTORY: Past Medical History:  Diagnosis Date   Acid reflux    Anxiety    Carpal tunnel syndrome    Cerebrovascular disease    Diabetic retinopathy (HCC)    Family history of pancreatic cancer 08/09/2021   Gait abnormality    History of stroke    Hyperlipidemia    Hypertension    Migraine    Pancreatic cyst 08/09/2021   Retinal detachment    Thrombocytosis    Trigger finger    Ulnar nerve entrapment     SURGICAL HISTORY: Past Surgical History:  Procedure Laterality Date   CESAREAN SECTION  1997   GALLBLADDER SURGERY  2011    SOCIAL HISTORY: Social History   Socioeconomic History   Marital status: Married    Spouse name: Not on file   Number of children: 4   Years  of education: Not on file   Highest education level: 11th grade  Occupational History   Not on file  Tobacco Use   Smoking status: Never   Smokeless tobacco: Never  Vaping Use   Vaping status: Never Used  Substance and Sexual Activity   Alcohol use: No   Drug use: No   Sexual activity: Not Currently  Other Topics Concern   Not on file  Social History Narrative   Pt lives with spouse in 1 story home   She has 4 children   Right handed   Drinks soda 3 times a week no coffee no tea   No regular work outs   Social Determinants of Corporate investment banker Strain: Not on file  Food Insecurity: Not on file  Transportation Needs: Not on file  Physical Activity: Not  on file  Stress: Not on file  Social Connections: Not on file  Intimate Partner Violence: Not on file    FAMILY HISTORY Family History  Problem Relation Age of Onset   Diabetes Mother    Cancer Mother    Cancer Father    Healthy Child     ALLERGIES:  is allergic to iodinated contrast media and morphine.  MEDICATIONS:  Current Outpatient Medications  Medication Sig Dispense Refill   ALPRAZolam (XANAX) 0.5 MG tablet Take 1 tablet (0.5 mg total) by mouth every 8 (eight) hours as needed for anxiety. 90 tablet 2   amLODipine (NORVASC) 10 MG tablet Take 1 tablet (10 mg total) by mouth daily. 90 tablet 0   aspirin 81 MG chewable tablet Chew 1 tablet by mouth daily.     atorvastatin (LIPITOR) 40 MG tablet TAKE 1 TABLET BY MOUTH EVERYDAY AT BEDTIME 90 tablet 1   carvedilol (COREG) 12.5 MG tablet TAKE 1 TABLET BY MOUTH 2 TIMES DAILY. 180 tablet 0   clopidogrel (PLAVIX) 75 MG tablet Take 1 tablet (75 mg total) by mouth daily. 90 tablet 0   diclofenac Sodium (VOLTAREN) 1 % GEL Apply 4 g topically 4 (four) times daily.     doxycycline (MONODOX) 100 MG capsule Take 1 capsule (100 mg total) by mouth 2 (two) times daily. 20 capsule 0   enalapril (VASOTEC) 20 MG tablet Take 20 mg by mouth 2 (two) times daily.     fluticasone (FLONASE) 50 MCG/ACT nasal spray PLACE 1 SPRAY INTO BOTH NOSTRILS IN THE MORNING AND AT BEDTIME. 48 mL 1   fluticasone (FLOVENT HFA) 44 MCG/ACT inhaler      glimepiride (AMARYL) 2 MG tablet Take by mouth.     ibuprofen (ADVIL,MOTRIN) 800 MG tablet Take 800 mg by mouth every 8 (eight) hours as needed.     Insulin Detemir (LEVEMIR) 100 UNIT/ML Pen Inject into the skin.     Insulin Pen Needle (BD PEN NEEDLE NANO 2ND GEN) 32G X 4 MM MISC Check blood sugar three times a day 100 each 3   lidocaine (XYLOCAINE) 1 % (with preservative) injection by Infiltration route.     metFORMIN (GLUCOPHAGE) 1000 MG tablet Take 1 tablet (1,000 mg total) by mouth 2 (two) times daily with a meal. 180  tablet 0   metoCLOPramide (REGLAN) 5 MG tablet Take 5 mg by mouth every 8 (eight) hours.     NOVOLOG FLEXPEN 100 UNIT/ML FlexPen INJECT 34 UNIT(S) INTO THE SKIN 3 TIMES DAILY BEFORE MEALS 15 mL 3   ondansetron (ZOFRAN) 4 MG tablet Take 1 tablet (4 mg total) by mouth every 8 (eight) hours as  needed for nausea or vomiting. 20 tablet 0   Oxycodone HCl 10 MG TABS Take 1 tablet (10 mg total) by mouth every 4 (four) hours as needed. 120 tablet 0   pantoprazole (PROTONIX) 40 MG tablet Take 1 tablet (40 mg total) by mouth daily. 30 tablet 6   Potassium Chloride ER 20 MEQ TBCR TAKE 1 TABLET (20 MEQ TOTAL) BY MOUTH IN THE MORNING, AT NOON, AND AT BEDTIME. 270 tablet 2   prochlorperazine (COMPAZINE) 10 MG tablet Take 10 mg by mouth every 6 (six) hours as needed for nausea or vomiting.     spironolactone (ALDACTONE) 25 MG tablet Take 0.5 tablets by mouth daily.     TOUJEO MAX SOLOSTAR 300 UNIT/ML Solostar Pen SMARTSIG:80 Unit(s) SUB-Q Every Night 300 mL 1   triamcinolone acetonide (TRIESENCE) 40 MG/ML SUSP Inject into the articular space.     TRULICITY 4.5 MG/0.5ML SOPN SMARTSIG:1 Pre-Filled Pen Syringe SUB-Q Once a Week     No current facility-administered medications for this visit.    PHYSICAL EXAMINATION:  ECOG PERFORMANCE STATUS: 2 - Symptomatic, <50% confined to bed   Vitals:   04/11/23 1111  BP: (!) 167/89  Pulse: 81  Resp: 16  Temp: 98.7 F (37.1 C)  SpO2: 100%    Filed Weights   04/11/23 1111  Weight: 140 lb (63.5 kg)     Physical Exam Vitals and nursing note reviewed.  Constitutional:      Appearance: Normal appearance. She is not toxic-appearing or diaphoretic.     Comments: Here alone  HENT:     Head: Normocephalic and atraumatic.     Right Ear: External ear normal.     Left Ear: External ear normal.     Nose: Nose normal. No congestion or rhinorrhea.  Eyes:     General: No scleral icterus.    Extraocular Movements: Extraocular movements intact.      Conjunctiva/sclera: Conjunctivae normal.     Pupils: Pupils are equal, round, and reactive to light.  Cardiovascular:     Rate and Rhythm: Normal rate.     Heart sounds: No murmur heard.    No friction rub. No gallop.  Pulmonary:     Effort: Pulmonary effort is normal. No respiratory distress.     Breath sounds: Normal breath sounds. No wheezing.  Abdominal:     General: Bowel sounds are normal.     Palpations: Abdomen is soft.     Tenderness: There is no abdominal tenderness. There is no rebound.  Musculoskeletal:        General: No swelling, tenderness or deformity.     Cervical back: Normal range of motion and neck supple. No rigidity or tenderness.     Comments: Has walker  Lymphadenopathy:     Head:     Right side of head: No submental, submandibular, tonsillar, preauricular, posterior auricular or occipital adenopathy.     Left side of head: No submental, submandibular, tonsillar, preauricular, posterior auricular or occipital adenopathy.     Cervical: No cervical adenopathy.     Right cervical: No superficial, deep or posterior cervical adenopathy.    Left cervical: No superficial, deep or posterior cervical adenopathy.     Upper Body:     Right upper body: No supraclavicular, axillary, pectoral or epitrochlear adenopathy.     Left upper body: No supraclavicular, axillary, pectoral or epitrochlear adenopathy.  Skin:    General: Skin is warm.     Coloration: Skin is not jaundiced.  Neurological:  General: No focal deficit present.     Mental Status: She is alert and oriented to person, place, and time.     Cranial Nerves: No cranial nerve deficit.     Gait: Gait abnormal.  Psychiatric:        Mood and Affect: Mood normal.        Behavior: Behavior normal.        Thought Content: Thought content normal.        Judgment: Judgment normal.      LABORATORY DATA: I have personally reviewed the data as listed:  Appointment on 04/11/2023  Component Date Value Ref  Range Status   WBC Count 04/11/2023 2.6 (L)  4.0 - 10.5 K/uL Final   RBC 04/11/2023 3.23 (L)  3.87 - 5.11 MIL/uL Final   Hemoglobin 04/11/2023 10.1 (L)  12.0 - 15.0 g/dL Final   HCT 40/98/1191 31.0 (L)  36.0 - 46.0 % Final   MCV 04/11/2023 96.0  80.0 - 100.0 fL Final   MCH 04/11/2023 31.3  26.0 - 34.0 pg Final   MCHC 04/11/2023 32.6  30.0 - 36.0 g/dL Final   RDW 47/82/9562 15.9 (H)  11.5 - 15.5 % Final   Platelet Count 04/11/2023 224  150 - 400 K/uL Final   nRBC 04/11/2023 0.0  0.0 - 0.2 % Final   Neutrophils Relative % 04/11/2023 71  % Final   Neutro Abs 04/11/2023 1.8  1.7 - 7.7 K/uL Final   Lymphocytes Relative 04/11/2023 23  % Final   Lymphs Abs 04/11/2023 0.6 (L)  0.7 - 4.0 K/uL Final   Monocytes Relative 04/11/2023 5  % Final   Monocytes Absolute 04/11/2023 0.1  0.1 - 1.0 K/uL Final   Eosinophils Relative 04/11/2023 1  % Final   Eosinophils Absolute 04/11/2023 0.0  0.0 - 0.5 K/uL Final   Basophils Relative 04/11/2023 0  % Final   Basophils Absolute 04/11/2023 0.0  0.0 - 0.1 K/uL Final   Immature Granulocytes 04/11/2023 0  % Final   Abs Immature Granulocytes 04/11/2023 0.01  0.00 - 0.07 K/uL Final   Performed at [facility], [address], [place], [place] [address]   Sodium 04/11/2023 138  135 - 145 mmol/L Final   Potassium 04/11/2023 2.6 (LL)  3.5 - 5.1 mmol/L Final   Comment: CRITICAL RESULT CALLED TO, READ BACK BY AND VERIFIED WITH RN K DUNLAP AT 1409 04/11/23 CRUICKSHANK A    Chloride 04/11/2023 100  98 - 111 mmol/L Final   CO2 04/11/2023 27  22 - 32 mmol/L Final   Glucose, Bld 04/11/2023 238 (H)  70 - 99 mg/dL Final   Glucose reference range applies only to samples taken after fasting for at least 8 hours.   BUN 04/11/2023 8  6 - 20 mg/dL Final   Creatinine 57/84/6962 0.90  0.44 - 1.00 mg/dL Final   Calcium 95/28/4132 8.6 (L)  8.9 - 10.3 mg/dL Final   Total Protein 44/08/270 7.1  6.5 - 8.1 g/dL Final   Albumin 53/66/4403 3.8  3.5 - 5.0 g/dL Final    AST 47/42/5956 26  15 - 41 U/L Final   ALT 04/11/2023 27  0 - 44 U/L Final   Alkaline Phosphatase 04/11/2023 70  38 - 126 U/L Final   Total Bilirubin 04/11/2023 0.8  0.3 - 1.2 mg/dL Final   GFR, Estimated 04/11/2023 >60  >60 mL/min Final   Comment: (NOTE) Calculated using the CKD-EPI Creatinine Equation (2021)    Anion gap 04/11/2023  11  5 - 15 Final   Performed at [facility], [address], [place], [place] [address]    RADIOGRAPHIC STUDIES: I have personally reviewed the radiological images as listed and agree with the findings in the report  No results found.  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  Currently on Hydrea 500 mg daily with cytopenias.  Would consider repeat bone marrow bx with molecular studies to evaluate status of this as she is at risk for progression to myelofibrosis.  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  Family history of pancreatic cancer is concerning  Hypokalemia  April 11 2023- Severe.  Patient is supposed to be taking potassium supplements by mouth but may not be compliant.  Directed to ED for supervised IV replacement    Cancer Staging  No matching staging information was found for the patient.    No problem-specific Assessment & Plan notes found for this encounter.    No orders of the defined types were placed in this encounter.   40  minutes was spent in patient care.  This included time spent preparing to see the patient (e.g., review of tests), obtaining and/or reviewing separately obtained history, counseling and educating the patient/family/caregiver,  ordering medications, tests, or procedures; documenting clinical information in the electronic or other health record, independently interpreting results and communicating results to the patient/family/caregiver as well as coordination of care.       All questions were answered. The patient knows to call the clinic with any problems, questions or concerns.  This note was electronically signed.    Loni Muse, MD  04/11/2023 3:41 PM

## 2023-04-12 ENCOUNTER — Telehealth: Payer: Self-pay

## 2023-04-12 ENCOUNTER — Other Ambulatory Visit: Payer: Self-pay

## 2023-04-12 ENCOUNTER — Other Ambulatory Visit: Payer: Self-pay | Admitting: Internal Medicine

## 2023-04-12 DIAGNOSIS — M898X9 Other specified disorders of bone, unspecified site: Secondary | ICD-10-CM

## 2023-04-12 DIAGNOSIS — G894 Chronic pain syndrome: Secondary | ICD-10-CM

## 2023-04-12 DIAGNOSIS — R161 Splenomegaly, not elsewhere classified: Secondary | ICD-10-CM

## 2023-04-12 LAB — CANCER ANTIGEN 19-9: CA 19-9: 54 U/mL — ABNORMAL HIGH (ref 0–35)

## 2023-04-12 MED ORDER — OXYCODONE HCL 10 MG PO TABS
10.0000 mg | ORAL_TABLET | ORAL | 0 refills | Status: DC | PRN
Start: 2023-04-12 — End: 2023-05-09

## 2023-04-12 MED ORDER — ALPRAZOLAM 0.5 MG PO TABS: 0.5000 mg | ORAL_TABLET | Freq: Three times a day (TID) | ORAL | 2 refills | Status: DC | PRN

## 2023-04-12 NOTE — Telephone Encounter (Signed)
Patient agreed to IV potassium at [facility] can't come today but willing to go tomorrow. Voiced she hasn't taken potassium in over a week started back yesterday 20 mg tid.

## 2023-04-12 NOTE — Addendum Note (Signed)
Addended by: Domenic Schwab on: 04/12/2023 05:07 PM   Modules accepted: Orders

## 2023-04-12 NOTE — Telephone Encounter (Signed)
SEE TELEPHONE CALL NOTE

## 2023-04-13 ENCOUNTER — Inpatient Hospital Stay: Payer: Medicare Other

## 2023-04-13 VITALS — BP 188/82 | HR 69 | Temp 97.7°F | Resp 18

## 2023-04-13 DIAGNOSIS — D473 Essential (hemorrhagic) thrombocythemia: Secondary | ICD-10-CM | POA: Diagnosis not present

## 2023-04-13 DIAGNOSIS — E876 Hypokalemia: Secondary | ICD-10-CM

## 2023-04-13 DIAGNOSIS — D378 Neoplasm of uncertain behavior of other specified digestive organs: Secondary | ICD-10-CM | POA: Diagnosis not present

## 2023-04-13 MED ORDER — POTASSIUM CHLORIDE 10 MEQ/100ML IV SOLN
10.0000 meq | INTRAVENOUS | Status: AC
  Administered 2023-04-13 (×3): 10 meq via INTRAVENOUS
  Filled 2023-04-13 (×3): qty 100

## 2023-04-13 MED ORDER — SODIUM CHLORIDE 0.9 % IV SOLN: Freq: Once | INTRAVENOUS | Status: AC

## 2023-04-13 NOTE — Patient Instructions (Signed)
Hypokalemia Hypokalemia means that the amount of potassium in the blood is lower than normal. Potassium is a mineral (electrolyte) that helps regulate the amount of fluid in the body. It also stimulates muscle tightening (contraction) and helps nerves work properly. Normally, most of the body's potassium is inside cells, and only a very small amount is in the blood. Because the amount in the blood is so small, minor changes to potassium levels in the blood can be life-threatening. What are the causes? This condition may be caused by: Antibiotic medicine. Diarrhea or vomiting. Taking too much of a medicine that helps you have a bowel movement (laxative) can cause diarrhea and lead to hypokalemia. Chronic kidney disease (CKD). Medicines that help the body get rid of excess fluid (diuretics). Eating disorders, such as anorexia or bulimia. Low magnesium levels in the body. Sweating a lot. What are the signs or symptoms? Symptoms of this condition include: Weakness. Constipation. Fatigue. Muscle cramps. Mental confusion. Skipped heartbeats or irregular heartbeat (palpitations). Tingling or numbness. How is this diagnosed? This condition is diagnosed with a blood test. How is this treated? This condition may be treated by: Taking potassium supplements. Adjusting the medicines that you take. Eating more foods that contain a lot of potassium. If your potassium level is very low, you may need to get potassium through an IV and be monitored in the hospital. Follow these instructions at home: Eating and drinking  Eat a healthy diet. A healthy diet includes fresh fruits and vegetables, whole grains, healthy fats, and lean proteins. If told, eat more foods that contain a lot of potassium. These include: Nuts, such as peanuts and pistachios. Seeds, such as sunflower seeds and pumpkin seeds. Peas, lentils, and lima beans. Whole grain and bran cereals and breads. Fresh fruits and vegetables,  such as apricots, avocado, bananas, cantaloupe, kiwi, oranges, tomatoes, asparagus, and potatoes. Juices, such as orange, tomato, and prune. Lean meats, including fish. Milk and milk products, such as yogurt. General instructions Take over-the-counter and prescription medicines only as told by your health care provider. This includes vitamins, natural food products, and supplements. Keep all follow-up visits. This is important. Contact a health care provider if: You have weakness that gets worse. You feel your heart pounding or racing. You vomit. You have diarrhea. You have diabetes and you have trouble keeping your blood sugar in your target range. Get help right away if: You have chest pain. You have shortness of breath. You have vomiting or diarrhea that lasts for more than 2 days. You faint. These symptoms may be an emergency. Get help right away. Call 911. Do not wait to see if the symptoms will go away. Do not drive yourself to the hospital. Summary Hypokalemia means that the amount of potassium in the blood is lower than normal. This condition is diagnosed with a blood test. Hypokalemia may be treated by taking potassium supplements, adjusting the medicines that you take, or eating more foods that are high in potassium. If your potassium level is very low, you may need to get potassium through an IV and be monitored in the hospital. This information is not intended to replace advice given to you by your health care provider. Make sure you discuss any questions you have with your health care provider. Document Revised: 04/01/2021 Document Reviewed: 04/01/2021 Elsevier Patient Education  2024 [employer]  

## 2023-04-13 NOTE — Progress Notes (Signed)
Per Allayne Butcher RPH ok to increase IVF as needed for peripheral potassium tolerance.

## 2023-04-15 ENCOUNTER — Other Ambulatory Visit: Payer: Self-pay | Admitting: Oncology

## 2023-04-15 DIAGNOSIS — D473 Essential (hemorrhagic) thrombocythemia: Secondary | ICD-10-CM

## 2023-04-17 NOTE — Telephone Encounter (Signed)
Instructed to stop this 01/13/23

## 2023-04-18 ENCOUNTER — Encounter: Payer: Self-pay | Admitting: Oncology

## 2023-04-18 ENCOUNTER — Telehealth: Payer: Self-pay | Admitting: Gastroenterology

## 2023-04-18 NOTE — Telephone Encounter (Signed)
Spoke with the patient.  She said her husband "is not doing too good." She is scheduled for her imaging tomorrow. She wants to postpone this until she feels her husband is more stable. She agrees to leave the [facility] as scheduled and she will make other arrangements for a care partner. She had planned on her husband being there. Patient agrees to call in a week with an update of plans.

## 2023-04-18 NOTE — Telephone Encounter (Signed)
Inbound call from patient stating that she needs to reschedule her procedure for 10/10 at [facility] with Dr. Meridee Score. Patient stated her husband had a heart attack and is in the hospital. Patient states she will also not be able to make her appointment for her MRI tomorrow. Patient is requesting a call at [phone] to discuss rescheduling. Please advise.

## 2023-04-19 ENCOUNTER — Ambulatory Visit (HOSPITAL_COMMUNITY): Payer: Medicare Other

## 2023-04-19 ENCOUNTER — Encounter: Payer: Self-pay | Admitting: Oncology

## 2023-04-24 NOTE — Telephone Encounter (Signed)
Noted  

## 2023-05-03 ENCOUNTER — Encounter (HOSPITAL_COMMUNITY): Payer: Self-pay | Admitting: Gastroenterology

## 2023-05-05 NOTE — Progress Notes (Incomplete)
ADDENDUM: Her potassium is once again dangerously low at 2.8. I offered IV supplement (as we did last month) and instructed her to increase her oral supplement to 20 meq 4 times daily. Her glucose was 302 and calcium 8.7. The white count was slightly improved to 2.8 with an ANC of 1900. Her hemoglobin is better at 10.4 and platelets normal at 265,000 so she will stay off the Hydrea. The CA 19-9 has increased from 51 to 56 since April.     [facility]  [address] [place],  [place]  [address] [phone]  Clinic Day: 05/09/23   Referring physician: Crist Fat, MD  ASSESSMENT & PLAN:  Assessment & Plan: Essential thrombocytosis [facility]) Essential thrombocythemia, well controlled with hydroxyurea 500 mg, was decreased from 4 times daily to 3 times daily since she had worsening anemia and leukopenia, and then decreased to twice daily in April 2024. In June, she had worsening leukopenia with the WBC  to 2.6, and worsening anemia, her platelets were normal at 274,000. Her hydroxyurea was stopped. Today's labs are pending.  Anemia Her hemoglobin was stable at 10.3 earlier this year and has fluctuated up and down. The most recent was 9.8 in June, 2024.   Leukopenia  Her white count was down to 2.6 with a mild neutropenia. The ANC is 1500. Most likely this is from her hydroxyurea, and that has been stopped. Unfortunately she is also at risk for development of acute leukemia due to her myeloproliferative disorder as well as the hydroxyurea.   Family history of pancreatic cancer First-degree relative, brother, with pancreatic cancer.  He declined genetic testing.  Genetic testing has been recommended for the patient, but not done due to lack of insurance coverage. That brother has expired but now her other brother ha also been diagnosed with pancreatic cancer and was unresectable at [facility]. She tells me he has now been referred to hospice.   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 This has slowly increased since 2016.  Now it has increased significantly to 3.8 cm in maximum diameter and appears to communicate with the main pancreatic duct. Her most recent MRI of the abdomen was done in May, 2024 and revealed further enlargement of the pancreatic lesion to 4.5 cm. She has a strong family history for pancreatic cancer, her other brother has now been referred to hospice because it was unresectable  at the time of surgery . She has missed 3 appointment for a MRI of the abdomen to follow-up on the pancreatic cyst. She has been referred to gastroenterology in [facility] for an EUS and it is scheduled by Dr. Meridee Score on July 11th.  Her biopsy will be rescheduled by Dr Meridee Score to August due to the severely elevated BP on July 11th.  Esophageal Stricture She has an esophageal stricture at the GE junction with erosions and had dilation on April 30th by Dr. Jennye Boroughs .   Colon Polyps She last had colonoscopy in September 2017 and had a tubular adenoma removed from the sigmoid colon. He was unable to perform the colonoscopy due to poor prep. He will reschedule.   Chronic pain She is on chronic opioids due to bone pain from her myeloproliferative disease. She has also had severe abdominal pain for the last 6 months.  Plan:  She had a CT of the head performed on 12/08/2022 that showed no acute intracranial  abnormalities, chronic microvascular disease, brain atrophy, and remote lacunar infarct within bilateral basal ganglia.  Her blood pressure has been constantly elevated, today it was 201/97 at the beginning of the visit. She is currently on medication for her elevated blood pressure and elevated blood glucose, and I advised her it to take it as ordered and follow up with her PCP, Dr Leonia Reader. She has a chronic history of non-compliance. Her biopsy was rescheduled to August  by Dr Meridee Score due to the elevated BP. I told her to follow up with him to have her biopsy done, it is crucial to get a diagnosis. I also informed her that we need to keep her BP under control to avoid another stroke. I drew her pictures of this enlarging cyst, growing from 2.5 cm in January of 2022 to 3.8 cm in November of 2023, to 4.5 cm in May of 2024. I also advised her to stay off hydroxyurea for now due to worsening anemia and leukopenia. Fortunately, her platelet count was normal, but we will keep checking her blood counts regularly., on a monthly basis. She is yet to reschedule the EUS with biopsy with Dr Meridee Score. Her labs are pending today. Her CA 19-9 was elevated in April, 2024 at 52 and so we drew another one today and will check monthly. Her brother recently had surgery for pancreatic cancer but it was unresectable and he has now been referred to hospice and her other brother had previously died of pancreatic cancer and she reminded me that she has had her father, 2 aunts, and 3 uncles that have died, presumably from cancer. She will come  back in 1 month with CBC, CMP, and CA 19-9 to see [facility] and in 2 months with CBC, CMP, and CA 19-9 to see me. Hopefully by then we will have a biopsy result. The patient understands the plans discussed today and is in agreement with them.  She knows to contact our office if she develops concerns prior to her next appointment.   I provided 30 minutes of face-to-face time during this this encounter and > 50% was spent counseling as documented under my assessment and plan.    Gery [place] MD  [facility] AT [facility] [address] [place] [address] Dept: [phone] Dept Fax: [phone]    CHIEF COMPLAINT:  CC: Essential thrombocythemia  Current Treatment:  Hydroxyurea 500 mg 3 times daily   HISTORY OF PRESENT ILLNESS:  Allison Stevens is a 57 y.o. female with a history  of essential thrombocythemia originally diagnosed in January 2005.  This has been treated with hydroxyurea, but the patient has often been non-compliant.  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  She has persistent pain in the left upper quadrant, attributed to splenomegaly, as well as bone pain, for which she remains on OxyContin with oxycodone for breakthrough.  We have tapered her opioids down over time. She saw Dr. Adriana Simas, her gastroenterologist at [facility], and underwent an esophageal dilatation in August 2016.  She had already had a colonoscopy due to previous gastrointestinal complaints.  We have been following a lesion in the pancreas seen incidentally on CT done in the emergency room for abdominal pain.  This has been felt to be a benign cyst.  Repeat MRI abdomen in November 2016, revealed a slight increase in the benign appearing cyst of the pancreas.  No splenomegaly was seen.  Repeat MRI abdomen in  1 year was recommended.  She was hospitalized in September 2017 and underwent colonoscopy with removal of a small polyp in the sigmoid colon.  Small internal hemorrhoids were also seen.  Dr. Chales Abrahams also obtained a CEA and CA 19-9, and. the CA 19-9 was elevated at 145, the CEA was normal. Repeat MRI abdomen with contrast in September 2017 revealed a stable 14 mm cystic lesion in the pancreas.  Again, no splenomegaly was seen.  Follow-up MRI in 1 year was recommended.  Repeat MRI abdomen in March 2019 revealed a 17 mm cystic lesion in the pancreatic uncinate process, which was felt to be stable.  No significant liver lesions were seen.  Repeat MRI abdomen in 1 year was recommended.  Repeat MRI abdomen in September 2019 revealed a stable 19 mm cystic lesion of the pancreas, which appeared to be benign and still felt to be stable.  Repeat MRI abdomen with and without contrast in 1 year was recommended.  MRI abdomen in October 2020 revealed a stable 17 mm cystic lesion of the pancreas.   Continued yearly follow up MRI abdomen was recommended.   Screening mammogram in February 2016 revealed a possible mass in the right breast.  Right diagnostic mammogram and ultrasound was done and revealed 2 well circumscribed hypoechoic lesions in the right breast at 11 o 'clock, felt to represent cysts.  Repeat mammogram and ultrasound in August revealed heterogeneous fibroglandular tissue felt to be benign, with resolution of the previously seen cysts and no masses.  Repeat diagnostic bilateral mammogram and ultrasound in 6 months was once again recommended.  Bilateral diagnostic mammogram in March 2017 did not reveal any evidence of malignancy, so 1 year follow-up screening mammogram was recommended.  Bilateral screening mammogram in March 2018 did not reveal any evidence of malignancy.  Bone density scan in June 2018 revealed osteopenia with a T-score of -1.5 in the femur.  The patient was instructed to take calcium and vitamin-D twice daily.  The patient's family history is significant in that her father had pancreatic cancer at age 30, a paternal uncle had pancreatic cancer and a paternal aunt had pancreatic cancer.  Her brother also had pancreatic cancer at age 16.  She is appropriate for genetic testing, but this has not been done yet due to insurance coverage.  Bilateral screening mammogram in December 2020 did not reveal any evidence of malignancy.    Repeat MRI abdomen in September 2021 revealed a 1.9 cm cystic lesion in the pancreatic uncinate process, which showed no significant change compared to more recent exams, but has shown a slow increase in size since 2016. 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 Follow-up by MRI in 2 years was recommended. She reports having another stroke in May 2022.  She then had COVID later in May and was hospitalized due to this.  She had been on hydroxyurea 4 times a day, but this was reduced  in September to 3 times a day as her platelets were controlled and she was more anemic with a hemoglobin of 10. Repeat CBC in November revealed her hemoglobin to be back up to 11.6.  Her last colonoscopy was in September 2017 and she did have a polypectomy of a tubular adenoma of the sigmoid colon at that time.  He also had an EGD in April of 2018, which showed mild gastritis, a small hiatal hernia, and a Schatzki ring.  She also had esophageal dilation.  INTERVAL HISTORY:  Imagene is here today for repeat clinical assessment essential thrombocythemia and an enlarging pancreatic cyst. Patient states that she feels *** and ***.     She denies signs of infection such as sore throat, sinus drainage, cough, or urinary symptoms.  She denies fevers or recurrent chills. She denies pain. She denies nausea, vomiting, chest pain, dyspnea or cough. Her appetite is *** and her weight {Weight change:10426}.  Patient states that she feels not too good and complains of chronic pain in her left lower rib area rated 5/10. She has been feeling unsteady and has had intermittent dizziness. She has had 1 -2 falls since her last visit. She also complains of stress at home. Her blood pressure has been constantly elevated, today it was 201/97 at the beginning of the visit. She is currently on medication for her elevated blood pressure and elevated blood glucose, and I advised her it to take it as ordered and follow up with her PCP, Dr Leonia Reader. Her biopsy was rescheduled to August by Dr Meridee Score due to the elevated BP. I told her follow up with him to have her biopsy done. I also informed her that we need to keep her BP under control to avoid another stroke. I drew her pictures of this enlarging cyst, growing from 2.5 cm in January of 2022 to 3.8 cm in November of 2023, to 4.5 cm in May of 2024. I also advised her to stay off hydroxyurea for now due to worsening anemia and leukopenia. Fortunately, her platelet count was normal,  but we will keep checking her blood counts regularly. She is yet to reschedule the EUS with biopsy with Dr Meridee Score. Her labs are pending today. Her CA 19-9 was elevated in April, 2024 at 74 and so we drew another one today and will check monthly. Her brother recently had surgery for pancreatic cancer but it was unresectable and he has now been referred to hospice and her other brother had previously died of pancreatic cancer and she reminded me that she has had her father, 2 aunts, and 3 uncles that have died, presumably from cancer. She will come  back in 1 month with CBC, CMP, and CA 19-9 to see [facility] and in 2 months with CBC, CMP, and CA 19-9 to see me, hopefully with a biopsy result.. She  denies signs of infections such as sore throat, sinus drainage, cough or urinary symptoms. She  denies fever or recurrent chills. She  also deny nausea, vomiting, chest pain dyspnea or cough. Her  appetite is good and Her  weight has increased 1 pounds over last 1 month      REVIEW OF SYSTEMS:  Review of Systems  Constitutional:  Negative for appetite change, chills, diaphoresis, fatigue, fever and unexpected weight change.  HENT:   Negative for hearing loss, lump/mass, mouth sores, nosebleeds, sore throat, tinnitus, trouble swallowing and voice change.   Eyes: Negative.  Negative for eye problems and icterus.  Respiratory: Negative.  Negative for chest tightness, cough, hemoptysis, shortness of breath and wheezing.   Cardiovascular: Negative.  Negative for chest pain, leg swelling and palpitations.  Gastrointestinal:  Negative for abdominal distention, abdominal pain, blood in stool, constipation, diarrhea, nausea, rectal pain and vomiting.  Endocrine: Negative.  Negative for hot flashes.  Genitourinary: Negative.  Negative for bladder incontinence, difficulty urinating, dyspareunia, dysuria, frequency, hematuria, menstrual problem, nocturia, pelvic pain, vaginal bleeding and vaginal discharge.    Musculoskeletal:  Positive for arthralgias (right hip since  a fall) and gait problem (limping due to previous stroke, has worsened). Negative for back pain, flank pain, myalgias, neck pain and neck stiffness.       Bones hurt  Skin: Negative.  Negative for itching, rash and wound.       Large abrasions on both knee L>R from fall.   Neurological:  Positive for extremity weakness and gait problem (limping due to previous stroke, has worsened). Negative for dizziness, headaches, light-headedness, numbness, seizures and speech difficulty.  Hematological:  Negative for adenopathy. Bruises/bleeds easily.  Psychiatric/Behavioral:  Positive for depression. Negative for confusion, decreased concentration, sleep disturbance and suicidal ideas. The patient is not nervous/anxious.      VITALS:  There were no vitals taken for this visit.  Wt Readings from Last 3 Encounters:  04/11/23 140 lb (63.5 kg)  02/15/23 143 lb 2 oz (64.9 kg)  02/14/23 142 lb 6.4 oz (64.6 kg)    There is no height or weight on file to calculate BMI.  Performance status (ECOG): 1 - Symptomatic but completely ambulatory  PHYSICAL EXAM:  Physical Exam Vitals and nursing note reviewed.  Constitutional:      General: She is not in acute distress.    Appearance: Normal appearance. She is normal weight. She is not ill-appearing, toxic-appearing or diaphoretic.  HENT:     Head: Normocephalic and atraumatic.     Right Ear: Tympanic membrane, ear canal and external ear normal. There is no impacted cerumen.     Left Ear: Tympanic membrane, ear canal and external ear normal. There is no impacted cerumen.     Nose: Nose normal. No congestion or rhinorrhea.     Mouth/Throat:     Mouth: Mucous membranes are moist.     Pharynx: Oropharynx is clear. No oropharyngeal exudate or posterior oropharyngeal erythema.  Eyes:     General: No scleral icterus.       Right eye: No discharge.        Left eye: No discharge.     Extraocular  Movements: Extraocular movements intact.     Conjunctiva/sclera: Conjunctivae normal.     Pupils: Pupils are equal, round, and reactive to light.  Neck:     Vascular: No carotid bruit.  Cardiovascular:     Rate and Rhythm: Normal rate and regular rhythm.     Pulses: Normal pulses.     Heart sounds: Normal heart sounds. No murmur heard.    No friction rub. No gallop.  Pulmonary:     Effort: Pulmonary effort is normal. No respiratory distress.     Breath sounds: Normal breath sounds. No stridor. No wheezing, rhonchi or rales.  Chest:     Chest wall: No tenderness.  Abdominal:     General: Bowel sounds are normal. There is no distension.     Palpations: Abdomen is soft. There is no hepatomegaly, splenomegaly or mass.     Tenderness: There is no abdominal tenderness. There is no right CVA tenderness, left CVA tenderness, guarding or rebound.     Hernia: No hernia is present.     Comments:     Musculoskeletal:        General: No swelling, tenderness, deformity or signs of injury. Normal range of motion.     Cervical back: Normal range of motion and neck supple. No rigidity or tenderness.     Right lower leg: No edema.     Left lower leg: No edema.  Lymphadenopathy:     Cervical: No cervical adenopathy.  Upper Body:     Right upper body: No supraclavicular or axillary adenopathy.     Left upper body: No supraclavicular or axillary adenopathy.     Lower Body: No right inguinal adenopathy. No left inguinal adenopathy.  Skin:    General: Skin is warm and dry.     Coloration: Skin is not jaundiced or pale.     Findings: No abrasion, bruising, erythema, lesion or rash.     Comments:    Neurological:     General: No focal deficit present.     Mental Status: She is alert and oriented to person, place, and time. Mental status is at baseline.     Cranial Nerves: No cranial nerve deficit.     Sensory: No sensory deficit.     Motor: No weakness.     Coordination: Coordination normal.      Gait: Gait normal.     Deep Tendon Reflexes: Reflexes normal.  Psychiatric:        Mood and Affect: Mood normal.        Behavior: Behavior normal.        Thought Content: Thought content normal.        Judgment: Judgment normal.    LABS:      Latest Ref Rng & Units 04/11/2023   10:54 AM 02/14/2023   10:38 AM 01/13/2023    9:06 AM  CBC  WBC 4.0 - 10.5 K/uL 2.6  2.8  2.6   Hemoglobin 12.0 - 15.0 g/dL 16.1  09.6  9.8   Hematocrit 36.0 - 46.0 % 31.0  32.5  30.3   Platelets 150 - 400 K/uL 224  265  273       Latest Ref Rng & Units 04/11/2023   10:54 AM 02/14/2023   10:38 AM 01/13/2023    9:06 AM  CMP  Glucose 70 - 99 mg/dL 045  409  811   BUN 6 - 20 mg/dL 8  9  8    Creatinine 0.44 - 1.00 mg/dL 9.14  7.82  9.56   Sodium 135 - 145 mmol/L 138  137  139   Potassium 3.5 - 5.1 mmol/L 2.6  2.8  2.6   Chloride 98 - 111 mmol/L 100  98  99   CO2 22 - 32 mmol/L 27  30  31    Calcium 8.9 - 10.3 mg/dL 8.6  8.7  8.6   Total Protein 6.5 - 8.1 g/dL 7.1  7.0  7.0   Total Bilirubin 0.3 - 1.2 mg/dL 0.8  0.6  0.7   Alkaline Phos 38 - 126 U/L 70  69  65   AST 15 - 41 U/L 26  21  17    ALT 0 - 44 U/L 27  23  21     Component Ref Range & Units 4 wk ago 2 mo ago 5 mo ago  CA 19-9 0 - 35 U/mL 54 High  56 High  CM 51 High  CM   Lab Results  Component Value Date   CEA1 1.9 11/16/2022   /  CEA  Date Value Ref Range Status  11/16/2022 1.9 0.0 - 4.7 ng/mL Final    Comment:    (NOTE)                             Nonsmokers          <3.9  Smokers             <5.6 [employer] Electrochemiluminescence Immunoassay (ECLIA) Values obtained with different assay methods or kits cannot be used interchangeably.  Results cannot be interpreted as absolute evidence of the presence or absence of malignant disease. Performed At: [facility] [address] [place], [place] [id number] Jolene Schimke MD MV:[id number]    No results found for: "PSA1" Lab Results   Component Value Date   WUX324 37 (H) 04/11/2023   No results found for: "CAN125"  No results found for: "TOTALPROTELP", "ALBUMINELP", "A1GS", "A2GS", "BETS", "BETA2SER", "GAMS", "MSPIKE", "SPEI" Lab Results  Component Value Date   TIBC 259 08/08/2022   TIBC 301 11/09/2021   FERRITIN 134 08/08/2022   FERRITIN 59 11/09/2021   IRONPCTSAT 42 08/08/2022   IRONPCTSAT 26 11/09/2021   Component Ref Range & Units 11/09/2022 3 mo ago  Cholesterol, Total 100 - 199 mg/dL 401 027  Triglycerides 0 - 149 mg/dL 79 82  HDL >25 mg/dL 38 Low  38 Low   VLDL Cholesterol Cal 5 - 40 mg/dL 15 16  LDL Chol Calc (NIH) 0 - 99 mg/dL 366 High  95  Chol/HDL Ratio 0.0 - 4.4 ratio 4.4 3.9 CM   Component Ref Range & Units 11/09/2022 3 mo ago  Hgb A1c MFr Bld 4.8 - 5.6 % 7.5 High  7.7 High  CM  Est. average glucose Bld gHb Est-mCnc mg/dL 440 347   Component Ref Range & Units 08/08/2022  Vitamin B-12 232 - 1,245 pg/mL 307   Component Ref Range & Units 08/08/2022  TSH 0.450 - 4.500 uIU/mL 3.380    No results found for: "LDH"  STUDIES:           HISTORY:   Past Medical History:  Diagnosis Date   Acid reflux    Anxiety    Carpal tunnel syndrome    Cerebrovascular disease    Diabetic retinopathy (HCC)    Family history of pancreatic cancer 08/09/2021   Gait abnormality    History of stroke    Hyperlipidemia    Hypertension    Migraine    Pancreatic cyst 08/09/2021   Retinal detachment    Thrombocytosis    Trigger finger    Ulnar nerve entrapment     Past Surgical History:  Procedure Laterality Date   CESAREAN SECTION  1997   GALLBLADDER SURGERY  2011    Family History  Problem Relation Age of Onset   Diabetes Mother    Cancer Mother    Cancer Father    Healthy Child      Social History:  reports that she has never smoked. She has never used smokeless tobacco. She reports that she does not drink alcohol and does not use drugs.The patient is alone  today.  Allergies:  Allergies  Allergen Reactions   Iodinated Contrast Media Other (See Comments) and Shortness Of Breath   Morphine Itching and Other (See Comments)    "Makes me feel out of my mind" per pt.    Current Medications: Current Outpatient Medications  Medication Sig Dispense Refill   ALPRAZolam (XANAX) 0.5 MG tablet Take 1 tablet (0.5 mg total) by mouth every 8 (eight) hours as needed for anxiety. 90 tablet 2   amLODipine (NORVASC) 10 MG tablet Take 1 tablet (10 mg total) by mouth daily. 90 tablet 0   aspirin 81 MG chewable tablet Chew 1 tablet by mouth daily.     atorvastatin (LIPITOR) 40 MG tablet TAKE  1 TABLET BY MOUTH EVERYDAY AT BEDTIME 90 tablet 1   carvedilol (COREG) 12.5 MG tablet TAKE 1 TABLET BY MOUTH 2 TIMES DAILY. 180 tablet 0   clopidogrel (PLAVIX) 75 MG tablet Take 1 tablet (75 mg total) by mouth daily. 90 tablet 0   diclofenac Sodium (VOLTAREN) 1 % GEL Apply 4 g topically 4 (four) times daily.     doxycycline (MONODOX) 100 MG capsule Take 1 capsule (100 mg total) by mouth 2 (two) times daily. 20 capsule 0   enalapril (VASOTEC) 20 MG tablet Take 20 mg by mouth 2 (two) times daily.     fluticasone (FLONASE) 50 MCG/ACT nasal spray PLACE 1 SPRAY INTO BOTH NOSTRILS IN THE MORNING AND AT BEDTIME. 48 mL 1   fluticasone (FLOVENT HFA) 44 MCG/ACT inhaler      glimepiride (AMARYL) 2 MG tablet Take by mouth.     ibuprofen (ADVIL,MOTRIN) 800 MG tablet Take 800 mg by mouth every 8 (eight) hours as needed.     Insulin Detemir (LEVEMIR) 100 UNIT/ML Pen Inject into the skin.     Insulin Pen Needle (BD PEN NEEDLE NANO 2ND GEN) 32G X 4 MM MISC Check blood sugar three times a day 100 each 3   lidocaine (XYLOCAINE) 1 % (with preservative) injection by Infiltration route.     metFORMIN (GLUCOPHAGE) 1000 MG tablet Take 1 tablet (1,000 mg total) by mouth 2 (two) times daily with a meal. 180 tablet 0   metoCLOPramide (REGLAN) 5 MG tablet Take 5 mg by mouth every 8 (eight) hours.      NOVOLOG FLEXPEN 100 UNIT/ML FlexPen INJECT 34 UNIT(S) INTO THE SKIN 3 TIMES DAILY BEFORE MEALS 15 mL 3   ondansetron (ZOFRAN) 4 MG tablet Take 1 tablet (4 mg total) by mouth every 8 (eight) hours as needed for nausea or vomiting. 20 tablet 0   Oxycodone HCl 10 MG TABS Take 1 tablet (10 mg total) by mouth every 4 (four) hours as needed. 120 tablet 0   pantoprazole (PROTONIX) 40 MG tablet Take 1 tablet (40 mg total) by mouth daily. 30 tablet 6   Potassium Chloride ER 20 MEQ TBCR TAKE 1 TABLET (20 MEQ TOTAL) BY MOUTH IN THE MORNING, AT NOON, AND AT BEDTIME. 270 tablet 2   prochlorperazine (COMPAZINE) 10 MG tablet Take 10 mg by mouth every 6 (six) hours as needed for nausea or vomiting.     spironolactone (ALDACTONE) 25 MG tablet Take 0.5 tablets by mouth daily.     TOUJEO MAX SOLOSTAR 300 UNIT/ML Solostar Pen SMARTSIG:80 Unit(s) SUB-Q Every Night 300 mL 1   triamcinolone acetonide (TRIESENCE) 40 MG/ML SUSP Inject into the articular space.     TRULICITY 4.5 MG/0.5ML SOPN SMARTSIG:1 Pre-Filled Pen Syringe SUB-Q Once a Week     No current facility-administered medications for this visit.    I,Jasmine M Lassiter,acting as a scribe for Dellia Beckwith, MD.,have documented all relevant documentation on the behalf of Dellia Beckwith, MD,as directed by  Dellia Beckwith, MD while in the presence of Dellia Beckwith, MD.

## 2023-05-08 NOTE — Progress Notes (Signed)
Pre op call eval Name:Deanie Gemma Payor Leonia Reader MD Cardiologist-n/a   EKG-n/a Echo-n/a Cath-n/a Stress-n/a ICD/PM-n/a Blood thinner-Plavix- pt states not on, confirmed with pt not on any blood thinners GLP-1- Trulicity last dose 9/24th/25th told not to take before procedure  Hx:HTN, Dm, CVA, Pancreatic cyst,thrombocythemia. Of note, was cancelled last time tried to do colonoscopy because her blood pressures were elevated. She went to see her primary after that 02/16/23 where they refilled all her meds and per patient she has taken them and I instructed which to take am of procedure. As far as current health, says not having any cardiac,breathing issues, does walk with walker. Also has a pcp re-visit 05/09/23.   Anesthesia Review: Yes

## 2023-05-09 ENCOUNTER — Inpatient Hospital Stay: Payer: Medicare Other | Admitting: Oncology

## 2023-05-09 ENCOUNTER — Inpatient Hospital Stay: Payer: Medicare Other

## 2023-05-09 ENCOUNTER — Inpatient Hospital Stay (INDEPENDENT_AMBULATORY_CARE_PROVIDER_SITE_OTHER): Payer: Medicare Other | Admitting: Oncology

## 2023-05-09 ENCOUNTER — Inpatient Hospital Stay: Payer: Medicare Other | Attending: Hematology and Oncology

## 2023-05-09 ENCOUNTER — Encounter: Payer: Self-pay | Admitting: Oncology

## 2023-05-09 ENCOUNTER — Other Ambulatory Visit: Payer: Self-pay | Admitting: Oncology

## 2023-05-09 ENCOUNTER — Other Ambulatory Visit: Payer: Self-pay

## 2023-05-09 VITALS — BP 158/76 | HR 70 | Temp 97.8°F | Resp 18 | Ht 64.0 in | Wt 138.5 lb

## 2023-05-09 DIAGNOSIS — R1012 Left upper quadrant pain: Secondary | ICD-10-CM | POA: Insufficient documentation

## 2023-05-09 DIAGNOSIS — Z860101 Personal history of adenomatous and serrated colon polyps: Secondary | ICD-10-CM | POA: Insufficient documentation

## 2023-05-09 DIAGNOSIS — Z800 Family history of malignant neoplasm of digestive organs: Secondary | ICD-10-CM | POA: Diagnosis not present

## 2023-05-09 DIAGNOSIS — Z79891 Long term (current) use of opiate analgesic: Secondary | ICD-10-CM | POA: Insufficient documentation

## 2023-05-09 DIAGNOSIS — R978 Other abnormal tumor markers: Secondary | ICD-10-CM | POA: Insufficient documentation

## 2023-05-09 DIAGNOSIS — G8929 Other chronic pain: Secondary | ICD-10-CM | POA: Insufficient documentation

## 2023-05-09 DIAGNOSIS — K222 Esophageal obstruction: Secondary | ICD-10-CM | POA: Insufficient documentation

## 2023-05-09 DIAGNOSIS — D473 Essential (hemorrhagic) thrombocythemia: Secondary | ICD-10-CM | POA: Insufficient documentation

## 2023-05-09 DIAGNOSIS — M898X9 Other specified disorders of bone, unspecified site: Secondary | ICD-10-CM

## 2023-05-09 DIAGNOSIS — D72819 Decreased white blood cell count, unspecified: Secondary | ICD-10-CM | POA: Diagnosis not present

## 2023-05-09 DIAGNOSIS — D471 Chronic myeloproliferative disease: Secondary | ICD-10-CM | POA: Diagnosis not present

## 2023-05-09 DIAGNOSIS — R161 Splenomegaly, not elsewhere classified: Secondary | ICD-10-CM

## 2023-05-09 DIAGNOSIS — Z79899 Other long term (current) drug therapy: Secondary | ICD-10-CM | POA: Diagnosis not present

## 2023-05-09 DIAGNOSIS — D649 Anemia, unspecified: Secondary | ICD-10-CM | POA: Insufficient documentation

## 2023-05-09 DIAGNOSIS — M85859 Other specified disorders of bone density and structure, unspecified thigh: Secondary | ICD-10-CM | POA: Insufficient documentation

## 2023-05-09 DIAGNOSIS — G894 Chronic pain syndrome: Secondary | ICD-10-CM

## 2023-05-09 DIAGNOSIS — E876 Hypokalemia: Secondary | ICD-10-CM | POA: Diagnosis not present

## 2023-05-09 DIAGNOSIS — Z8673 Personal history of transient ischemic attack (TIA), and cerebral infarction without residual deficits: Secondary | ICD-10-CM | POA: Diagnosis not present

## 2023-05-09 DIAGNOSIS — K862 Cyst of pancreas: Secondary | ICD-10-CM | POA: Insufficient documentation

## 2023-05-09 LAB — CMP (CANCER CENTER ONLY)
ALT: 23 U/L (ref 0–44)
AST: 18 U/L (ref 15–41)
Albumin: 4 g/dL (ref 3.5–5.0)
Alkaline Phosphatase: 86 U/L (ref 38–126)
Anion gap: 10 (ref 5–15)
BUN: 13 mg/dL (ref 6–20)
CO2: 24 mmol/L (ref 22–32)
Calcium: 9.1 mg/dL (ref 8.9–10.3)
Chloride: 100 mmol/L (ref 98–111)
Creatinine: 1.01 mg/dL — ABNORMAL HIGH (ref 0.44–1.00)
GFR, Estimated: >60 mL/min (ref >60)
Glucose, Bld: 364 mg/dL — ABNORMAL HIGH (ref 70–99)
Potassium: 3.5 mmol/L (ref 3.5–5.1)
Sodium: 134 mmol/L — ABNORMAL LOW (ref 135–145)
Total Bilirubin: 0.5 mg/dL (ref 0.3–1.2)
Total Protein: 7.2 g/dL (ref 6.5–8.1)

## 2023-05-09 LAB — CBC WITH DIFFERENTIAL (CANCER CENTER ONLY)
Abs Immature Granulocytes: 0.01 10*3/uL (ref 0.00–0.07)
Basophils Absolute: 0 10*3/uL (ref 0.0–0.1)
Basophils Relative: 0 %
Eosinophils Absolute: 0.1 10*3/uL (ref 0.0–0.5)
Eosinophils Relative: 2 %
HCT: 32 % — ABNORMAL LOW (ref 36.0–46.0)
Hemoglobin: 10.4 g/dL — ABNORMAL LOW (ref 12.0–15.0)
Immature Granulocytes: 0 %
Lymphocytes Relative: 25 %
Lymphs Abs: 0.8 10*3/uL (ref 0.7–4.0)
MCH: 31.4 pg (ref 26.0–34.0)
MCHC: 32.5 g/dL (ref 30.0–36.0)
MCV: 96.7 fL (ref 80.0–100.0)
Monocytes Absolute: 0.2 10*3/uL (ref 0.1–1.0)
Monocytes Relative: 5 %
Neutro Abs: 2.1 10*3/uL (ref 1.7–7.7)
Neutrophils Relative %: 68 %
Platelet Count: 272 10*3/uL (ref 150–400)
RBC: 3.31 MIL/uL — ABNORMAL LOW (ref 3.87–5.11)
RDW: 14.6 % (ref 11.5–15.5)
WBC Count: 3.2 10*3/uL — ABNORMAL LOW (ref 4.0–10.5)
nRBC: 0 % (ref 0.0–0.2)

## 2023-05-09 MED ORDER — OXYCODONE HCL 10 MG PO TABS: 10.0000 mg | ORAL_TABLET | ORAL | 0 refills | Status: DC | PRN

## 2023-05-09 NOTE — Progress Notes (Signed)
[facility]  [address] [place],  [place]  [address] [phone]  Clinic Day: 05/09/23  Referring physician: Crist Fat, MD  ASSESSMENT & PLAN:  Assessment & Plan: Essential thrombocytosis [facility]) Essential thrombocythemia, well controlled with hydroxyurea 500 mg, was decreased from 4 times daily to 3 times daily since she had worsening anemia and leukopenia, and then decreased to twice daily in April 2024. In June, she had worsening leukopenia with the WBC  to 2.6, and worsening anemia, her platelets were normal at 274,000. Her hydroxyurea was stopped and her blood counts continue to remain low but stable. Today's labs are pending.  Anemia Her hemoglobin was stable at 10.3 earlier this year and has fluctuated up and down. The most recent was 10.1 in September, 2024.   Leukopenia  Her white count was down to 2.6 with a mild neutropenia. The ANC is 1500. Most likely this is from her hydroxyurea, and that has been stopped. Unfortunately she is also at risk for development of acute leukemia or myelofibrosis due to her myeloproliferative disorder as well as the hydroxyurea.   Family history of pancreatic cancer Two first-degree relatives, her brothers, with pancreatic cancer.  He declined genetic testing.  Genetic testing has been recommended for the patient, but not done due to lack of insurance coverage. That brother has expired but now her other brother ha also been diagnosed with pancreatic cancer and was unresectable at [facility]. She tells me he has now been referred to hospice. There are multiple other relatives with pancreatic cancer also.   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 This has slowly increased since 2016.  Now it has increased significantly to 3.8 cm in maximum diameter and appears to communicate with the main pancreatic duct. Her most recent  MRI of the abdomen was done in May, 2024 and revealed further enlargement of the pancreatic lesion to 4.5 cm. She has a strong family history for pancreatic cancer, her other brother has now been referred to hospice because it was unresectable  at the time of surgery . She has missed 3 appointment for a MRI of the abdomen to follow-up on the pancreatic cyst. She has been referred to gastroenterology in [facility] for an EUS and it was scheduled by Dr. Meridee Score on July 11th.  Her biopsy was rescheduled by Dr Meridee Score to August due to the severely elevated BP on July 11th. It is now rescheduled for 05/11/2023 and hopefully she will finally be able to proceed.   Esophageal Stricture She has an esophageal stricture at the GE junction with erosions and had dilation on April 30th by Dr. Jennye Boroughs .   Colon Polyps She last had colonoscopy in September 2017 and had a tubular adenoma removed from the sigmoid colon. He was unable to perform the colonoscopy due to poor prep. He will reschedule.   Chronic pain She is on chronic opioids due to bone pain from her myeloproliferative disease. She has also had severe abdominal pain for the last 6 months.  Old CVA She has had at least 1 if not several strokes related to her severe thrombocytosis. Fortunately her platelets are under control now, even off the medication. She may be evolving into a myelofibrosis but we will follow her blood counts for now. She had a CT of the head performed on 12/08/2022 that showed no acute intracranial abnormalities, chronic microvascular disease, brain atrophy, and  remote lacunar infarct within bilateral basal ganglia.    Plan:  She will have an EUS by the GI doctor in [place] done on 05/11/2023. Her BP today was 183/87 and when rechecked at the end of the appointment was 158/76. She continues to not take Hydrea. I will prescribe Oxycodone HCL 10mg . Her WBC was low at 2.6, hemoglobin low at 10.1, and platelet count normal at  224,000 as of 04/11/2023. Her potassium level was very low at 2.6 and a mildly low calcium of 8.6 as of 04/11/2023. She takes 2 potassium supplements daily but compliance is questionable and she had refused IV potassium last time.  Her labs today are pending. Her CA 19.9 has been mildly elevated in 50's but stable. She will see her PCP in 1.5 weeks. I will see her back in 1 month with CBC and CMP to review the findings of her EUS. The patient understands the plans discussed today and is in agreement with them.  She knows to contact our office if she develops concerns prior to her next appointment.  I provided 11 minutes of face-to-face time during this this encounter and > 50% was spent counseling as documented under my assessment and plan.    Gery Pray MD  [facility] AT [facility] [address] [place] [address] Dept: [phone] Dept Fax: [phone]    CHIEF COMPLAINT:  CC: Essential thrombocythemia  Current Treatment:  Hydroxyurea 500 mg 3 times daily   HISTORY OF PRESENT ILLNESS:  Allison Stevens is a 57 y.o. female with a history of essential thrombocythemia originally diagnosed in January 2005.  This has been treated with hydroxyurea, but the patient has often been non-compliant.  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  She has persistent pain in the left upper quadrant, attributed to splenomegaly, as well as bone pain, for which she remains on OxyContin with oxycodone for breakthrough.  We have tapered her opioids down over time. She saw Dr. Adriana Simas, her gastroenterologist at [facility], and underwent an esophageal dilatation in August 2016.  She had already had a colonoscopy due to previous gastrointestinal complaints.  We have been following a lesion in the pancreas seen incidentally on CT done in the emergency room for abdominal pain.  This has been felt to be a benign cyst.  Repeat MRI  abdomen in November 2016, revealed a slight increase in the benign appearing cyst of the pancreas.  No splenomegaly was seen.  Repeat MRI abdomen in 1 year was recommended.  She was hospitalized in September 2017 and underwent colonoscopy with removal of a small polyp in the sigmoid colon.  Small internal hemorrhoids were also seen.  Dr. Chales Abrahams also obtained a CEA and CA 19-9, and. the CA 19-9 was elevated at 145, the CEA was normal. Repeat MRI abdomen with contrast in September 2017 revealed a stable 14 mm cystic lesion in the pancreas.  Again, no splenomegaly was seen.  Follow-up MRI in 1 year was recommended.  Repeat MRI abdomen in March 2019 revealed a 17 mm cystic lesion in the pancreatic uncinate process, which was felt to be stable.  No significant liver lesions were seen.  Repeat MRI abdomen in 1 year was recommended.  Repeat MRI abdomen in September 2019 revealed a stable 19 mm cystic lesion of the pancreas, which appeared to be benign and still felt to be stable.  Repeat MRI abdomen with and without contrast in 1 year  was recommended.  MRI abdomen in October 2020 revealed a stable 17 mm cystic lesion of the pancreas.  Continued yearly follow up MRI abdomen was recommended.   Screening mammogram in February 2016 revealed a possible mass in the right breast.  Right diagnostic mammogram and ultrasound was done and revealed 2 well circumscribed hypoechoic lesions in the right breast at 11 o 'clock, felt to represent cysts.  Repeat mammogram and ultrasound in August revealed heterogeneous fibroglandular tissue felt to be benign, with resolution of the previously seen cysts and no masses.  Repeat diagnostic bilateral mammogram and ultrasound in 6 months was once again recommended.  Bilateral diagnostic mammogram in March 2017 did not reveal any evidence of malignancy, so 1 year follow-up screening mammogram was recommended.  Bilateral screening mammogram in March 2018 did not reveal any evidence of malignancy.   Bone density scan in June 2018 revealed osteopenia with a T-score of -1.5 in the femur.  The patient was instructed to take calcium and vitamin-D twice daily.  The patient's family history is significant in that her father had pancreatic cancer at age 30, a paternal uncle had pancreatic cancer and a paternal aunt had pancreatic cancer.  Her brother also had pancreatic cancer at age 33.  She is appropriate for genetic testing, but this has not been done yet due to insurance coverage.  Bilateral screening mammogram in December 2020 did not reveal any evidence of malignancy.    Repeat MRI abdomen in September 2021 revealed a 1.9 cm cystic lesion in the pancreatic uncinate process, which showed no significant change compared to more recent exams, but has shown a slow increase in size since 2016. 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 Follow-up by MRI in 2 years was recommended. She reports having another stroke in May 2022.  She then had COVID later in May and was hospitalized due to this.  She had been on hydroxyurea 4 times a day, but this was reduced in September to 3 times a day as her platelets were controlled and she was more anemic with a hemoglobin of 10. Repeat CBC in November revealed her hemoglobin to be back up to 11.6.  Her last colonoscopy was in September 2017 and she did have a polypectomy of a tubular adenoma of the sigmoid colon at that time.  He also had an EGD in April of 2018, which showed mild gastritis, a small hiatal hernia, and a Schatzki ring.  She also had esophageal dilation.  INTERVAL HISTORY:  Cliffie is here today for repeat clinical assessment essential thrombocythemia and an enlarging pancreatic cyst. Patient states that she feels ok but complains of pain in her mid left upper abdomen. She will have an EUS by the GI doctor in [place] done on 05/11/2023. Her BP today was 183/87 and when rechecked at the  end of the appointment was 158/76. She continues to remain off Hydrea. I will prescribe Oxycodone HCL 10mg . Her WBC was low at 2.6, hemoglobin low at 10.1, and platelet count normal at 224,000 as of 04/11/2023. Her potassium level was very low at 2.6 and a mildly low calcium of 8.6 as of 04/11/2023. She takes 2 potassium supplements daily but compliance is questionable and she had refused IV potassium last time.  Her labs today are pending. Her CA 19.9 has been mildly elevated in 50's but stable. She will see her PCP in 1.5 weeks. I will see her back in 1 month  with CBC and CMP to review the findings of her EUS. She denies signs of infection such as sore throat, sinus drainage, cough, or urinary symptoms.  She denies fevers or recurrent chills. She denies pain. She denies nausea, vomiting, chest pain, dyspnea or cough. Her appetite is off and on and her weight has decreased 2 pounds over last month .    REVIEW OF SYSTEMS:  Review of Systems  Constitutional:  Positive for fatigue. Negative for appetite change, chills, diaphoresis, fever and unexpected weight change.  HENT:   Negative for hearing loss, lump/mass, mouth sores, nosebleeds, sore throat, tinnitus, trouble swallowing and voice change.   Eyes: Negative.  Negative for eye problems and icterus.  Respiratory: Negative.  Negative for chest tightness, cough, hemoptysis, shortness of breath and wheezing.   Cardiovascular: Negative.  Negative for chest pain, leg swelling and palpitations.  Gastrointestinal:  Positive for abdominal pain. Negative for abdominal distention, blood in stool, constipation, diarrhea, nausea, rectal pain and vomiting.  Endocrine: Negative.  Negative for hot flashes.  Genitourinary: Negative.  Negative for bladder incontinence, difficulty urinating, dyspareunia, dysuria, frequency, hematuria, menstrual problem, nocturia, pelvic pain, vaginal bleeding and vaginal discharge.   Musculoskeletal:  Positive for arthralgias (right hip  since a fall) and gait problem (limping due to previous stroke, has worsened). Negative for back pain, flank pain, myalgias, neck pain and neck stiffness.       Bones hurt  Skin: Negative.  Negative for itching, rash and wound.  Neurological:  Positive for extremity weakness and gait problem (limping due to previous stroke, has worsened). Negative for dizziness, headaches, light-headedness, numbness, seizures and speech difficulty.  Hematological:  Negative for adenopathy. Bruises/bleeds easily.  Psychiatric/Behavioral:  Positive for depression. Negative for confusion, decreased concentration, sleep disturbance and suicidal ideas. The patient is not nervous/anxious.      VITALS:  Blood pressure (!) 158/76, pulse 70, temperature 97.8 F (36.6 C), temperature source Oral, resp. rate 18, height 5\' 4"  (1.626 m), weight 138 lb 8 oz (62.8 kg), SpO2 100%.  Wt Readings from Last 3 Encounters:  05/11/23 134 lb (60.8 kg)  05/09/23 138 lb 8 oz (62.8 kg)  04/11/23 140 lb (63.5 kg)    Body mass index is 23.77 kg/m.  Performance status (ECOG): 1 - Symptomatic but completely ambulatory  PHYSICAL EXAM:  Physical Exam Vitals and nursing note reviewed.  Constitutional:      General: She is not in acute distress.    Appearance: Normal appearance. She is normal weight. She is not ill-appearing, toxic-appearing or diaphoretic.  HENT:     Head: Normocephalic and atraumatic.     Right Ear: Tympanic membrane, ear canal and external ear normal. There is no impacted cerumen.     Left Ear: Tympanic membrane, ear canal and external ear normal. There is no impacted cerumen.     Nose: Nose normal. No congestion or rhinorrhea.     Mouth/Throat:     Mouth: Mucous membranes are moist.     Pharynx: Oropharynx is clear. No oropharyngeal exudate or posterior oropharyngeal erythema.  Eyes:     General: No scleral icterus.       Right eye: No discharge.        Left eye: No discharge.     Extraocular Movements:  Extraocular movements intact.     Conjunctiva/sclera: Conjunctivae normal.     Pupils: Pupils are equal, round, and reactive to light.  Neck:     Vascular: No carotid bruit.  Cardiovascular:  Rate and Rhythm: Normal rate and regular rhythm.     Pulses: Normal pulses.     Heart sounds: Normal heart sounds. No murmur heard.    No friction rub. No gallop.  Pulmonary:     Effort: Pulmonary effort is normal. No respiratory distress.     Breath sounds: Normal breath sounds. No stridor. No wheezing, rhonchi or rales.  Chest:     Chest wall: No tenderness.  Abdominal:     General: Bowel sounds are normal. There is no distension.     Palpations: Abdomen is soft. There is no hepatomegaly, splenomegaly or mass.     Tenderness: There is abdominal tenderness in the left upper quadrant. There is no right CVA tenderness, left CVA tenderness, guarding or rebound.     Hernia: No hernia is present.     Comments: Abdomen is mildly distended   Musculoskeletal:        General: No swelling, tenderness, deformity or signs of injury. Normal range of motion.     Cervical back: Normal range of motion and neck supple. No rigidity or tenderness.     Right lower leg: No edema.     Left lower leg: No edema.  Lymphadenopathy:     Cervical: No cervical adenopathy.     Upper Body:     Right upper body: No supraclavicular or axillary adenopathy.     Left upper body: No supraclavicular or axillary adenopathy.     Lower Body: No right inguinal adenopathy. No left inguinal adenopathy.  Skin:    General: Skin is warm and dry.     Coloration: Skin is not jaundiced or pale.     Findings: No abrasion, bruising, erythema, lesion or rash.     Comments:    Neurological:     General: No focal deficit present.     Mental Status: She is alert and oriented to person, place, and time. Mental status is at baseline.     Cranial Nerves: No cranial nerve deficit.     Sensory: No sensory deficit.     Motor: No weakness.      Coordination: Coordination normal.     Gait: Gait abnormal.     Deep Tendon Reflexes: Reflexes normal.     Comments: Weak lower extremities  Psychiatric:        Mood and Affect: Mood normal.        Behavior: Behavior normal.        Thought Content: Thought content normal.        Judgment: Judgment normal.    LABS:      Latest Ref Rng & Units 05/09/2023    1:49 PM 04/11/2023   10:54 AM 02/14/2023   10:38 AM  CBC  WBC 4.0 - 10.5 K/uL 3.2  2.6  2.8   Hemoglobin 12.0 - 15.0 g/dL 28.4  13.2  44.0   Hematocrit 36.0 - 46.0 % 32.0  31.0  32.5   Platelets 150 - 400 K/uL 272  224  265       Latest Ref Rng & Units 05/09/2023    1:49 PM 04/11/2023   10:54 AM 02/14/2023   10:38 AM  CMP  Glucose 70 - 99 mg/dL 102  725  366   BUN 6 - 20 mg/dL 13  8  9    Creatinine 0.44 - 1.00 mg/dL 4.40  3.47  4.25   Sodium 135 - 145 mmol/L 134  138  137   Potassium 3.5 - 5.1 mmol/L 3.5  2.6  2.8   Chloride 98 - 111 mmol/L 100  100  98   CO2 22 - 32 mmol/L 24  27  30    Calcium 8.9 - 10.3 mg/dL 9.1  8.6  8.7   Total Protein 6.5 - 8.1 g/dL 7.2  7.1  7.0   Total Bilirubin 0.3 - 1.2 mg/dL 0.5  0.8  0.6   Alkaline Phos 38 - 126 U/L 86  70  69   AST 15 - 41 U/L 18  26  21    ALT 0 - 44 U/L 23  27  23     Lab Results  Component Value Date   CEA1 1.9 11/16/2022   /  CEA  Date Value Ref Range Status  11/16/2022 1.9 0.0 - 4.7 ng/mL Final    Comment:    (NOTE)                             Nonsmokers          <3.9                             Smokers             <5.6 Roche [employer] Electrochemiluminescence Immunoassay (ECLIA) Values obtained with different assay methods or kits cannot be used interchangeably.  Results cannot be interpreted as absolute evidence of the presence or absence of malignant disease. Performed At: [facility] [address] [place], [place] [id number] Jolene Schimke MD WU:[id number]    No results found for: "PSA1" Lab Results  Component Value Date   [phone] 22 (H)  05/09/2023   No results found for: "CAN125"  No results found for: "TOTALPROTELP", "ALBUMINELP", "A1GS", "A2GS", "BETS", "BETA2SER", "GAMS", "MSPIKE", "SPEI" Lab Results  Component Value Date   TIBC 259 08/08/2022   TIBC 301 11/09/2021   FERRITIN 134 08/08/2022   FERRITIN 59 11/09/2021   IRONPCTSAT 42 08/08/2022   IRONPCTSAT 26 11/09/2021   Component Ref Range & Units 11/09/2022 3 mo ago  Cholesterol, Total 100 - 199 mg/dL 086 578  Triglycerides 0 - 149 mg/dL 79 82  HDL >46 mg/dL 38 Low  38 Low   VLDL Cholesterol Cal 5 - 40 mg/dL 15 16  LDL Chol Calc (NIH) 0 - 99 mg/dL 962 High  95  Chol/HDL Ratio 0.0 - 4.4 ratio 4.4 3.9 CM   Component Ref Range & Units 11/09/2022 3 mo ago  Hgb A1c MFr Bld 4.8 - 5.6 % 7.5 High  7.7 High  CM  Est. average glucose Bld gHb Est-mCnc mg/dL 952 841   Component Ref Range & Units 08/08/2022  Vitamin B-12 232 - 1,245 pg/mL 307   Component Ref Range & Units 08/08/2022  TSH 0.450 - 4.500 uIU/mL 3.380    No results found for: "LDH"  STUDIES:           HISTORY:   Past Medical History:  Diagnosis Date   Acid reflux    Anxiety    Carpal tunnel syndrome    Cerebrovascular disease    Diabetic retinopathy (HCC)    Family history of pancreatic cancer 08/09/2021   Gait abnormality    History of stroke    Hyperlipidemia    Hypertension    Migraine    Pancreatic cyst 08/09/2021   Retinal detachment    Thrombocytosis    Trigger finger    Ulnar nerve entrapment  Past Surgical History:  Procedure Laterality Date   BIOPSY  05/11/2023   Procedure: BIOPSY;  Surgeon: Meridee Score Netty Starring., MD;  Location: [facility];  Service: Gastroenterology;;   CESAREAN SECTION  1997   ESOPHAGOGASTRODUODENOSCOPY N/A 05/11/2023   Procedure: ESOPHAGOGASTRODUODENOSCOPY (EGD);  Surgeon: Lemar Lofty., MD;  Location: [facility];  Service: Gastroenterology;  Laterality: N/A;   EUS N/A 05/11/2023   Procedure: UPPER ENDOSCOPIC  ULTRASOUND (EUS) RADIAL;  Surgeon: Lemar Lofty., MD;  Location: [facility];  Service: Gastroenterology;  Laterality: N/A;   FINE NEEDLE ASPIRATION N/A 05/11/2023   Procedure: FINE NEEDLE ASPIRATION (FNA) LINEAR;  Surgeon: Lemar Lofty., MD;  Location: [facility];  Service: Gastroenterology;  Laterality: N/A;   GALLBLADDER SURGERY  2011   Family History  Problem Relation Age of Onset   Diabetes Mother    Cancer Mother    Pancreatic cancer Father    Pancreatic cancer Brother    Pancreatic cancer Brother    Healthy Child    Pancreatic cancer Paternal Uncle   Her brother recently had surgery for pancreatic cancer but it was unresectable and he has now been referred to hospice and her other brother had previously died of pancreatic cancer and she reminded me that she has had her father, 2 aunts, and 3 uncles that have died, presumably from cancer.    Social History:  reports that she has never smoked. She has never used smokeless tobacco. She reports that she does not drink alcohol and does not use drugs.The patient is alone today.  Allergies:  Allergies  Allergen Reactions   Iodinated Contrast Media Other (See Comments) and Shortness Of Breath   Morphine Itching and Other (See Comments)    "Makes me feel out of my mind" per pt.    Current Medications: Current Outpatient Medications  Medication Sig Dispense Refill   famotidine (PEPCID) 40 MG tablet Take 40 mg by mouth at bedtime.     ALPRAZolam (XANAX) 0.5 MG tablet Take 1 tablet (0.5 mg total) by mouth every 8 (eight) hours as needed for anxiety. 90 tablet 2   amLODipine (NORVASC) 10 MG tablet Take 1 tablet (10 mg total) by mouth daily. 90 tablet 0   aspirin 81 MG chewable tablet Chew 1 tablet by mouth daily.     atorvastatin (LIPITOR) 40 MG tablet TAKE 1 TABLET BY MOUTH EVERYDAY AT BEDTIME 90 tablet 1   carvedilol (COREG) 12.5 MG tablet TAKE 1 TABLET BY MOUTH 2 TIMES DAILY. 180 tablet 0   clopidogrel (PLAVIX)  75 MG tablet Take 1 tablet (75 mg total) by mouth daily.     diclofenac Sodium (VOLTAREN) 1 % GEL Apply 4 g topically 4 (four) times daily.     enalapril (VASOTEC) 20 MG tablet Take 20 mg by mouth 2 (two) times daily.     fluticasone (FLONASE) 50 MCG/ACT nasal spray PLACE 1 SPRAY INTO BOTH NOSTRILS IN THE MORNING AND AT BEDTIME. 48 mL 1   fluticasone (FLOVENT HFA) 44 MCG/ACT inhaler      glimepiride (AMARYL) 2 MG tablet Take by mouth.     ibuprofen (ADVIL,MOTRIN) 800 MG tablet Take 800 mg by mouth every 8 (eight) hours as needed.     Insulin Detemir (LEVEMIR) 100 UNIT/ML Pen Inject into the skin.     Insulin Pen Needle (BD PEN NEEDLE NANO 2ND GEN) 32G X 4 MM MISC Check blood sugar three times a day 100 each 3   metFORMIN (GLUCOPHAGE) 1000 MG tablet TAKE 1 TABLET (  1,000 MG TOTAL) BY MOUTH TWICE A DAY WITH FOOD 180 tablet 0   NOVOLOG FLEXPEN 100 UNIT/ML FlexPen INJECT 34 UNIT(S) INTO THE SKIN 3 TIMES DAILY BEFORE MEALS 15 mL 3   ondansetron (ZOFRAN) 4 MG tablet Take 1 tablet (4 mg total) by mouth every 8 (eight) hours as needed for nausea or vomiting. 20 tablet 0   Oxycodone HCl 10 MG TABS Take 1 tablet (10 mg total) by mouth every 4 (four) hours as needed. 120 tablet 0   pantoprazole (PROTONIX) 40 MG tablet Take 1 tablet (40 mg total) by mouth daily. 30 tablet 6   potassium chloride SA (KLOR-CON M) 20 MEQ tablet Take 20 mEq by mouth 2 (two) times daily.     prochlorperazine (COMPAZINE) 10 MG tablet Take 10 mg by mouth every 6 (six) hours as needed for nausea or vomiting.     TOUJEO MAX SOLOSTAR 300 UNIT/ML Solostar Pen SMARTSIG:80 Unit(s) SUB-Q Every Night 300 mL 1   No current facility-administered medications for this visit.    I,Jasmine M Lassiter,acting as a scribe for Dellia Beckwith, MD.,have documented all relevant documentation on the behalf of Dellia Beckwith, MD,as directed by  Dellia Beckwith, MD while in the presence of Dellia Beckwith, MD.

## 2023-05-10 ENCOUNTER — Encounter (HOSPITAL_COMMUNITY): Payer: Self-pay | Admitting: Gastroenterology

## 2023-05-10 ENCOUNTER — Other Ambulatory Visit: Payer: Self-pay | Admitting: Student

## 2023-05-10 ENCOUNTER — Telehealth: Payer: Self-pay | Admitting: Oncology

## 2023-05-10 ENCOUNTER — Telehealth: Payer: Self-pay

## 2023-05-10 LAB — CANCER ANTIGEN 19-9: CA 19-9: 65 U/mL — ABNORMAL HIGH (ref 0–35)

## 2023-05-10 NOTE — Addendum Note (Signed)
Addended byHipolito Bayley on: 05/10/2023 09:38 AM   Modules accepted: Orders

## 2023-05-10 NOTE — Telephone Encounter (Signed)
-----   Message from Dellia Beckwith sent at 05/09/2023  6:12 PM EDT ----- Regarding: call Yeay!  Her K is barely normal, stay on 2 pills daily.  BS way too high at 364, try to get it down before Thurs. The rest looks good and her blood counts are a little better.  Stay off the [facility]

## 2023-05-10 NOTE — Telephone Encounter (Signed)
Patient has been scheduled. Aware of appt date and time.   Scheduling Message Entered by Gery Pray H on 05/09/2023 at  3:41 PM Priority: Routine <No visit type provided>  Department: [facility]  Provider:  Scheduling Notes:  RT 1 month with labs

## 2023-05-10 NOTE — Telephone Encounter (Signed)
Attempted to contact patient. No answer. 

## 2023-05-11 ENCOUNTER — Ambulatory Visit (HOSPITAL_BASED_OUTPATIENT_CLINIC_OR_DEPARTMENT_OTHER): Payer: Medicare Other | Admitting: Physician Assistant

## 2023-05-11 ENCOUNTER — Other Ambulatory Visit: Payer: Self-pay

## 2023-05-11 ENCOUNTER — Ambulatory Visit (HOSPITAL_COMMUNITY)
Admission: RE | Admit: 2023-05-11 | Discharge: 2023-05-11 | Disposition: A | Discharge status: Home / Self Care | Payer: Medicare Other | Attending: Gastroenterology | Admitting: Gastroenterology

## 2023-05-11 ENCOUNTER — Ambulatory Visit (HOSPITAL_COMMUNITY): Payer: Self-pay | Admitting: Physician Assistant

## 2023-05-11 ENCOUNTER — Encounter (HOSPITAL_COMMUNITY)
Admission: RE | Disposition: A | Discharge status: Home / Self Care | Payer: Self-pay | Source: Home / Self Care | Attending: Gastroenterology

## 2023-05-11 ENCOUNTER — Encounter (HOSPITAL_COMMUNITY): Payer: Self-pay | Admitting: Gastroenterology

## 2023-05-11 ENCOUNTER — Encounter: Payer: Self-pay | Admitting: Oncology

## 2023-05-11 DIAGNOSIS — K862 Cyst of pancreas: Secondary | ICD-10-CM

## 2023-05-11 DIAGNOSIS — K838 Other specified diseases of biliary tract: Secondary | ICD-10-CM | POA: Diagnosis not present

## 2023-05-11 DIAGNOSIS — Z7984 Long term (current) use of oral hypoglycemic drugs: Secondary | ICD-10-CM | POA: Insufficient documentation

## 2023-05-11 DIAGNOSIS — K209 Esophagitis, unspecified without bleeding: Secondary | ICD-10-CM | POA: Diagnosis not present

## 2023-05-11 DIAGNOSIS — Z7902 Long term (current) use of antithrombotics/antiplatelets: Secondary | ICD-10-CM | POA: Insufficient documentation

## 2023-05-11 DIAGNOSIS — K2100 Gastro-esophageal reflux disease with esophagitis, without bleeding: Secondary | ICD-10-CM | POA: Diagnosis not present

## 2023-05-11 DIAGNOSIS — K3189 Other diseases of stomach and duodenum: Secondary | ICD-10-CM | POA: Diagnosis not present

## 2023-05-11 DIAGNOSIS — I10 Essential (primary) hypertension: Secondary | ICD-10-CM | POA: Insufficient documentation

## 2023-05-11 DIAGNOSIS — Z8673 Personal history of transient ischemic attack (TIA), and cerebral infarction without residual deficits: Secondary | ICD-10-CM | POA: Insufficient documentation

## 2023-05-11 DIAGNOSIS — Z800 Family history of malignant neoplasm of digestive organs: Secondary | ICD-10-CM | POA: Diagnosis not present

## 2023-05-11 DIAGNOSIS — E119 Type 2 diabetes mellitus without complications: Secondary | ICD-10-CM | POA: Insufficient documentation

## 2023-05-11 DIAGNOSIS — K2970 Gastritis, unspecified, without bleeding: Secondary | ICD-10-CM

## 2023-05-11 DIAGNOSIS — K449 Diaphragmatic hernia without obstruction or gangrene: Secondary | ICD-10-CM | POA: Insufficient documentation

## 2023-05-11 DIAGNOSIS — Z794 Long term (current) use of insulin: Secondary | ICD-10-CM | POA: Diagnosis not present

## 2023-05-11 DIAGNOSIS — I899 Noninfective disorder of lymphatic vessels and lymph nodes, unspecified: Secondary | ICD-10-CM | POA: Diagnosis not present

## 2023-05-11 DIAGNOSIS — K2289 Other specified disease of esophagus: Secondary | ICD-10-CM | POA: Insufficient documentation

## 2023-05-11 HISTORY — PX: BIOPSY: SHX5522

## 2023-05-11 HISTORY — PX: ESOPHAGOGASTRODUODENOSCOPY: SHX5428

## 2023-05-11 HISTORY — PX: EUS: SHX5427

## 2023-05-11 HISTORY — PX: FINE NEEDLE ASPIRATION: SHX5430

## 2023-05-11 LAB — GLUCOSE, CAPILLARY
Glucose-Capillary: 108 mg/dL — ABNORMAL HIGH (ref 70–99)
Glucose-Capillary: 55 mg/dL — ABNORMAL LOW (ref 70–99)
Glucose-Capillary: 27 mg/dL — CL (ref 70–99)
Glucose-Capillary: 50 mg/dL — ABNORMAL LOW (ref 70–99)
Glucose-Capillary: 87 mg/dL (ref 70–99)

## 2023-05-11 SURGERY — UPPER ENDOSCOPIC ULTRASOUND (EUS) RADIAL
Anesthesia: Monitor Anesthesia Care

## 2023-05-11 MED ORDER — DEXTROSE 50 % IV SOLN
12.5000 g | INTRAVENOUS | Status: AC
  Administered 2023-05-11: 12.5 g via INTRAVENOUS

## 2023-05-11 MED ORDER — PROPOFOL 500 MG/50ML IV EMUL
INTRAVENOUS | Status: AC
  Filled 2023-05-11: qty 50

## 2023-05-11 MED ORDER — CIPROFLOXACIN IN D5W 400 MG/200ML IV SOLN
INTRAVENOUS | Status: AC
  Filled 2023-05-11: qty 200

## 2023-05-11 MED ORDER — PROPOFOL 500 MG/50ML IV EMUL
INTRAVENOUS | Status: DC | PRN
  Administered 2023-05-11: 135 ug/kg/min via INTRAVENOUS

## 2023-05-11 MED ORDER — SODIUM CHLORIDE 0.9 % IV SOLN: INTRAVENOUS | Status: DC | PRN

## 2023-05-11 MED ORDER — CLOPIDOGREL BISULFATE 75 MG PO TABS: 75.0000 mg | ORAL_TABLET | Freq: Every day | ORAL | Status: DC

## 2023-05-11 MED ORDER — CIPROFLOXACIN IN D5W 400 MG/200ML IV SOLN
INTRAVENOUS | Status: DC | PRN
Start: 2023-05-11 — End: 2023-05-11
  Administered 2023-05-11: 400 mg via INTRAVENOUS

## 2023-05-11 MED ORDER — CIPROFLOXACIN HCL 500 MG PO TABS: 500.0000 mg | ORAL_TABLET | Freq: Two times a day (BID) | ORAL | 0 refills | Status: AC

## 2023-05-11 MED ORDER — DEXTROSE 50 % IV SOLN
INTRAVENOUS | Status: AC
  Filled 2023-05-11: qty 50

## 2023-05-11 MED ORDER — LIDOCAINE HCL (CARDIAC) PF 100 MG/5ML IV SOSY
PREFILLED_SYRINGE | INTRAVENOUS | Status: DC | PRN
Start: 2023-05-11 — End: 2023-05-11
  Administered 2023-05-11: 60 mg via INTRAVENOUS

## 2023-05-11 MED ORDER — SODIUM CHLORIDE 0.9 % IV SOLN: INTRAVENOUS | Status: DC

## 2023-05-11 NOTE — Discharge Instructions (Addendum)

## 2023-05-11 NOTE — H&P (Signed)
GASTROENTEROLOGY PROCEDURE H&P NOTE   Primary Care Physician: Crist Fat, MD  HPI: Paralee Pendergrass Demiah Gullickson is a 57 y.o. female who presents for EGD/EUS to evaluate an enlarging pancreatic cyst with FHx of Pancreatic cancer.  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   No current facility-administered medications for this encounter. Allergies  Allergen Reactions   Iodinated Contrast Media Other (See Comments) and Shortness Of Breath   Morphine Itching and Other (See Comments)    "Makes me feel out of my mind" per pt.   Family History  Problem Relation Age of Onset   Diabetes Mother    Cancer Mother    Pancreatic cancer Father    Pancreatic cancer Brother    Pancreatic cancer Brother    Healthy Child    Pancreatic cancer Paternal Uncle    Social History   Socioeconomic History   Marital status: Married    Spouse name: Not on file   Number of children: 4   Years of education: Not on file   Highest education level: 11th grade  Occupational History   Not on file  Tobacco Use   Smoking status: Never   Smokeless tobacco: Never  Vaping Use   Vaping status: Never Used  Substance and Sexual Activity   Alcohol use: No   Drug use: No   Sexual activity: Not Currently  Other Topics Concern   Not on file  Social History Narrative   Pt lives with spouse in 1 story home   She has 4 children   Right handed   Drinks soda 3 times a  week no coffee no tea   No regular work outs   Social Determinants of Corporate investment banker Strain: Not on file  Food Insecurity: Not on file  Transportation Needs: Not on file  Physical Activity: Not on file  Stress: Not on file  Social Connections: Not on file  Intimate Partner Violence: Not on file    Physical Exam: There were no vitals filed for this visit. There is no height or weight on file to calculate BMI. GEN: NAD EYE: Sclerae anicteric ENT: MMM CV: Non-tachycardic GI: Soft, NT/ND NEURO:  Alert & Oriented x 3  Lab Results: Recent Labs    05/09/23 1349  WBC 3.2*  HGB 10.4*  HCT 32.0*  PLT 272   BMET Recent Labs    05/09/23 1349  NA 134*  K 3.5  CL 100  CO2 24  GLUCOSE 364*  BUN 13  CREATININE 1.01*  CALCIUM 9.1   LFT Recent Labs    05/09/23 1349  PROT 7.2  ALBUMIN 4.0  AST 18  ALT 23  ALKPHOS 86  BILITOT 0.5   PT/INR No results for input(s): "LABPROT", "INR" in the last 72 hours.   Impression / Plan: This is a 57 y.o.female  who presents for EGD/EUS to evaluate an enlarging pancreatic cyst with FHx of Pancreatic cancer.  The risks of an EUS including intestinal perforation, bleeding, infection, aspiration, and medication effects were discussed as was the possibility it may not give a definitive diagnosis if a biopsy is performed.  When a biopsy of the pancreas is done as part of the EUS, there is an additional risk of pancreatitis at the rate of about 1-2%.  It was explained that procedure related pancreatitis is typically mild, although it can be severe and even life threatening, which is why we do not perform random pancreatic biopsies and only biopsy a lesion/area we feel is concerning enough to warrant the risk.   The risks and benefits of endoscopic evaluation/treatment were discussed with the patient and/or family; these include but are not limited to the risk of perforation, infection, bleeding, missed lesions, lack of diagnosis,  severe illness requiring hospitalization, as well as anesthesia and sedation related illnesses.  The patient's history has been reviewed, patient examined, no change in status, and deemed stable for procedure.  The patient and/or family is agreeable to proceed.    Corliss Parish, MD  Gastroenterology [facility] Office # [phone]

## 2023-05-11 NOTE — Anesthesia Preprocedure Evaluation (Addendum)
Anesthesia Evaluation  Patient identified by MRN, date of birth, ID band Patient awake    Reviewed: Allergy & Precautions, NPO status , Patient's Chart, lab work & pertinent test results, reviewed documented beta blocker date and time   Airway Mallampati: II  TM Distance: >3 FB Neck ROM: Full    Dental  (+) Teeth Intact, Chipped,    Pulmonary    breath sounds clear to auscultation       Cardiovascular hypertension, Pt. on medications and Pt. on home beta blockers  Rhythm:Regular Rate:Normal     Neuro/Psych  Headaches PSYCHIATRIC DISORDERS Anxiety      Neuromuscular disease CVA    GI/Hepatic Neg liver ROS,GERD  Medicated,,  Endo/Other  diabetes, Type 2, Oral Hypoglycemic Agents, Insulin Dependent    Renal/GU negative Renal ROS     Musculoskeletal negative musculoskeletal ROS (+)    Abdominal   Peds  Hematology  (+) Blood dyscrasia, anemia   Anesthesia Other Findings   Reproductive/Obstetrics                             Anesthesia Physical Anesthesia Plan  ASA: 2  Anesthesia Plan: MAC   Post-op Pain Management: Minimal or no pain anticipated   Induction: Intravenous  PONV Risk Score and Plan: 0  Airway Management Planned: Natural Airway  Additional Equipment: None  Intra-op Plan:   Post-operative Plan:   Informed Consent: I have reviewed the patients History and Physical, chart, labs and discussed the procedure including the risks, benefits and alternatives for the proposed anesthesia with the patient or authorized representative who has indicated his/her understanding and acceptance.       Plan Discussed with: CRNA  Anesthesia Plan Comments:        Anesthesia Quick Evaluation

## 2023-05-11 NOTE — Progress Notes (Signed)
In recovery when coming out of procedure CBG is 50 patient is awake and able to drink so soda given. Patient drank some of soda, then given 2 orange juices. Patient finished 240 cc orange juice. . Patient denies symptoms and states she feels fine.  On recheck CBG -27. DR. Hart Rochester made aware of above and of 27 CBG. Order to give 1/2 amp D50. Will continue to assess. Patient still denies symptoms. Awake and alert and answering appropriately.

## 2023-05-11 NOTE — Progress Notes (Signed)
Hypoglycemic Event  CBG: 55  Treatment: D50 25 mL (12.5 gm)  Symptoms: None  Follow-up CBG: Time: 913 CBG Result:108  Possible Reasons for Event: Inadequate meal intake  Comments/MD notified:Dr. Mansouraty notified     Allison Stevens

## 2023-05-11 NOTE — Progress Notes (Signed)
Repeat CBG-87. Dr. Hart Rochester at bedside. Talked with patient and is fine with her going home. Instructed her to make sure she eats lunch right away at home. Patient agreed.

## 2023-05-11 NOTE — Transfer of Care (Signed)
Immediate Anesthesia Transfer of Care Note  Patient: Allison Stevens  Procedure(s) Performed: Procedure(s): UPPER ENDOSCOPIC ULTRASOUND (EUS) RADIAL (N/A) BIOPSY FINE NEEDLE ASPIRATION (FNA) LINEAR (N/A) ESOPHAGOGASTRODUODENOSCOPY (EGD) (N/A)  Patient Location: PACU  Anesthesia Type:MAC  Level of Consciousness:  sedated, patient cooperative and responds to stimulation  Airway & Oxygen Therapy:Patient Spontanous Breathing and Patient connected to face mask oxgen  Post-op Assessment:  Report given to PACU RN and Post -op Vital signs reviewed and stable  Post vital signs:  Reviewed and stable  Last Vitals:  Vitals:   05/11/23 0838  BP: (!) 178/63  Pulse: 74  Resp: 13  Temp: (!) 36.2 C  SpO2: 100%    Complications: No apparent anesthesia complications

## 2023-05-11 NOTE — Op Note (Signed)
[facility] Patient Name: Allison Stevens Procedure Date: 05/11/2023 MRN: [id number] Attending MD: Corliss Parish , MD, [id number] Date of Birth: 1966/01/28 CSN: [id number] Age: 57 Admit Type: Outpatient Procedure:                Upper EUS Indications:              Pancreatic cyst on MRCP Providers:                Corliss Parish, MD, Lorenza Evangelist, RN, Harrington Challenger, Technician Referring MD:             Dr. Jennye Boroughs, Dr. Gilman Buttner Medicines:                Monitored Anesthesia Care Complications:            No immediate complications. Estimated Blood Loss:     Estimated blood loss was minimal. Procedure:                Pre-Anesthesia Assessment:                           - Prior to the procedure, a History and Physical                            was performed, and patient medications and                            allergies were reviewed. The patient's tolerance of                            previous anesthesia was also reviewed. The risks                            and benefits of the procedure and the sedation                            options and risks were discussed with the patient.                            All questions were answered, and informed consent                            was obtained. Prior Anticoagulants: The patient has                            taken Plavix (clopidogrel), last dose was 5 days                            prior to procedure. ASA Grade Assessment: III - A                            patient with severe systemic disease. After  reviewing the risks and benefits, the patient was                            deemed in satisfactory condition to undergo the                            procedure.                           After obtaining informed consent, the endoscope was                            passed under direct vision. Throughout the                            procedure,  the patient's blood pressure, pulse, and                            oxygen saturations were monitored continuously. The                            [id number]-[id number] ([id number]) Olympus endoscope was introduced                            through the mouth, and advanced to the second part                            of duodenum. The [id number]-[id number] ([id number]) Olympus                            duodenoscope was introduced through the mouth, and                            advanced to the area of papilla. The [id number]                            ([id number]) Olympus linear ultrasound scope was                            introduced through the mouth, and advanced to the                            duodenum for ultrasound examination from the                            stomach and duodenum. The upper EUS was                            accomplished without difficulty. The patient                            tolerated the procedure. Scope In: Scope Out: Findings:      ENDOSCOPIC FINDING: :      Diffuse mild mucosal changes characterized by congestion, scalloping and  altered texture were found in the entire esophagus. Biopsies were taken       with a cold forceps for histology to rule out EOE/LLE.      The Z-line was irregular and was found 36 cm from the incisors.      A 3 cm hiatal hernia was present.      Patchy mildly erythematous mucosa without bleeding was found in the       entire examined stomach. Biopsies were taken with a cold forceps for       histology and Helicobacter pylori testing.      No gross lesions were noted in the duodenal bulb, in the first portion       of the duodenum and in the second portion of the duodenum.      The major papilla was normal.      ENDOSONOGRAPHIC FINDING: :      Pancreatic parenchymal abnormalities were noted in the entire pancreas.       These consisted of hyperechoic strands.      An anechoic lesion suggestive of a cyst was identified in the pancreatic       head. It  seems to have communication with the pancreatic duct within the       neck/head. The lesion measured 41 mm by 39 mm in maximal cross-sectional       diameter. There were 2 compartments thinly septated. The outer wall of       the lesion was thin. There was no associated mass. There was no internal       debris within the fluid-filled cavity. Diagnostic needle aspiration for       fluid was performed. Color Doppler imaging was utilized prior to needle       puncture to confirm a lack of significant vascular structures within the       needle path. One pass was made with the [employer] expect 22       gauge needle using a transduodenal approach. A stylet was used. The       amount of fluid collected was 40 mL. The fluid was turbid, brown and       watery. Sample(s) were sent for chemistry, amylase concentration,       cytology and CEA.      The diameter of the main pancreatic duct (MPD) measured:      - HOP 2.0 -> 4.2 mm (head of pancreas)      - NOP 4.5 -> 2.0 mm (neck of pancreas)      - BOP 1.8 mm (body of the pancreas)      - TOP 0.8 mm (tail of the pancreas).      There was dilation in the common bile duct (8.0 mm) and in the common       hepatic duct (7.0 mm).      Endosonographic imaging of the ampulla showed no intramural       (subepithelial) lesion.      Endosonographic imaging in the visualized portion of the liver showed no       mass.      No malignant-appearing lymph nodes were visualized in the celiac region       (level 20), peripancreatic region and porta hepatis region.      The celiac region was visualized. Impression:               EGD impression:                           -  Congested, scalloped, texture changed mucosa in                            the esophagus. Biopsied.                           - Z-line irregular, 36 cm from the incisors.                           - 3 cm hiatal hernia.                           - Erythematous mucosa in the stomach.  Biopsied.                           - No gross lesions in the duodenal bulb, in the                            first portion of the duodenum and in the second                            portion of the duodenum.                           - Normal major papilla.                           EUS impression:                           - Pancreatic parenchymal abnormalities consisting                            of hyperechoic strands were noted in the entire                            pancreas.                           - A cystic lesion was seen in the pancreatic head.                            Cytology results are pending. 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 Fine                            needle aspiration for fluid performed.                           - Main pancreatic duct (MPD) diameter was measured.  Endosonographically, the MPD had a dilated                            appearance within the head and neck and then a more                            normal appearance more proximally. After cyst                            aspiration, the PD did not significantly change in                            size.                           - There was dilation in the common bile duct and in                            the common hepatic duct.                           - No malignant-appearing lymph nodes were                            visualized in the celiac region (level 20),                            peripancreatic region and porta hepatis region. Moderate Sedation:      Not Applicable - Patient had care per Anesthesia. Recommendation:           - The patient will be observed post-procedure,                            until all discharge criteria are met.                           - Discharge patient to home.                           - Patient has a  contact number available for                            emergencies. The signs and symptoms of potential                            delayed complications were discussed with the                            patient. Return to normal activities tomorrow.                            Written discharge instructions were provided to the                            patient.                           -  Low fat diet.                           - Ciprofloxacin 500 mg twice daily for 3 days to                            decrease risk of post interventional infectious                            risk.                           - May restart Plavix on 10/12 to decrease risk of                            post interventional bleeding.                           - Continue present medications.                           - Await cytology results and await path results.                           - Pending results of fluid analysis will discuss                            with patient what surveillance imaging will be                            required if this turns out to be a mucinous IPMN as                            I am concerned about, though color fluid seems                            different than would normally be expected.                           - The findings and recommendations were discussed                            with the patient. Procedure Code(s):        --- Professional ---                           [phone], Esophagogastroduodenoscopy, flexible,                            transoral; with transendoscopic ultrasound-guided                            intramural or transmural fine needle                            aspiration/biopsy(s), (includes  endoscopic                            ultrasound examination limited to the esophagus,                            stomach or duodenum, and adjacent structures)                           43239, 59, Esophagogastroduodenoscopy, flexible,                             transoral; with biopsy, single or multiple Diagnosis Code(s):        --- Professional ---                           K22.89, Other specified disease of esophagus                           K31.89, Other diseases of stomach and duodenum                           K44.9, Diaphragmatic hernia without obstruction or                            gangrene                           K86.9, Disease of pancreas, unspecified                           K86.2, Cyst of pancreas                           I89.9, Noninfective disorder of lymphatic vessels                            and lymph nodes, unspecified                           R93.3, Abnormal findings on diagnostic imaging of                            other parts of digestive tract                           K83.8, Other specified diseases of biliary tract CPT copyright 2022 American Medical Association. All rights reserved. The codes documented in this report are preliminary and upon coder review may  be revised to meet current compliance requirements. Corliss Parish, MD 05/11/2023 10:24:27 AM Number of Addenda: 0

## 2023-05-11 NOTE — Anesthesia Postprocedure Evaluation (Signed)
Anesthesia Post Note  Patient: Allison Stevens  Procedure(s) Performed: UPPER ENDOSCOPIC ULTRASOUND (EUS) RADIAL BIOPSY FINE NEEDLE ASPIRATION (FNA) LINEAR ESOPHAGOGASTRODUODENOSCOPY (EGD)     Patient location during evaluation: PACU Anesthesia Type: MAC Level of consciousness: awake and alert Pain management: pain level controlled Vital Signs Assessment: post-procedure vital signs reviewed and stable Respiratory status: spontaneous breathing, nonlabored ventilation, respiratory function stable and patient connected to nasal cannula oxygen Cardiovascular status: stable and blood pressure returned to baseline Postop Assessment: no apparent nausea or vomiting Anesthetic complications: no Comments: Blood glucose corrected in recovery. Pt instructed to resume normal diet as soon as possible.   No notable events documented.  Last Vitals:  Vitals:   05/11/23 1106 05/11/23 1116  BP: (!) 168/64 (!) 192/72  Pulse: 66 68  Resp: 12 12  Temp:    SpO2: 100% 100%    Last Pain:  Vitals:   05/11/23 1116  TempSrc:   PainSc: 0-No pain                 Shelton Silvas

## 2023-05-12 ENCOUNTER — Encounter: Payer: Self-pay | Admitting: Gastroenterology

## 2023-05-12 LAB — CYTOLOGY - NON PAP

## 2023-05-14 ENCOUNTER — Encounter (HOSPITAL_COMMUNITY): Payer: Self-pay | Admitting: Gastroenterology

## 2023-05-17 LAB — SURGICAL PATHOLOGY

## 2023-05-18 ENCOUNTER — Encounter: Payer: Self-pay | Admitting: Oncology

## 2023-05-19 ENCOUNTER — Ambulatory Visit: Payer: Medicare Other | Admitting: Internal Medicine

## 2023-05-24 ENCOUNTER — Other Ambulatory Visit: Payer: Self-pay | Admitting: Internal Medicine

## 2023-05-24 ENCOUNTER — Other Ambulatory Visit: Payer: Self-pay | Admitting: Student

## 2023-05-24 ENCOUNTER — Other Ambulatory Visit: Payer: Self-pay | Admitting: Oncology

## 2023-05-24 ENCOUNTER — Encounter: Payer: Self-pay | Admitting: Oncology

## 2023-05-24 DIAGNOSIS — D473 Essential (hemorrhagic) thrombocythemia: Secondary | ICD-10-CM

## 2023-05-26 ENCOUNTER — Other Ambulatory Visit: Payer: Self-pay

## 2023-05-26 MED ORDER — NOVOLOG FLEXPEN 100 UNIT/ML ~~LOC~~ SOPN: PEN_INJECTOR | SUBCUTANEOUS | 3 refills | Status: DC

## 2023-05-26 NOTE — Progress Notes (Signed)
Rx refill

## 2023-05-29 ENCOUNTER — Encounter: Payer: Self-pay | Admitting: Gastroenterology

## 2023-05-29 ENCOUNTER — Other Ambulatory Visit: Payer: Self-pay

## 2023-05-29 MED ORDER — ATORVASTATIN CALCIUM 40 MG PO TABS: 40.0000 mg | ORAL_TABLET | Freq: Every day | ORAL | 1 refills | Status: DC

## 2023-05-29 NOTE — Progress Notes (Signed)
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  Even though there had been some suggestion of a potential pancreatic duct connection, the normal CEA and glucose levels make mucinous cystic neoplasms and IPMN seem less likely.  I recommend a repeat MRI/MRCP in 6 months with her elevated CA 19-9 still persistent to likely pursue surveillance.  I will forward this to Dr. Camila Li office and to her PCP and to her hematologist.   Corliss Parish, MD [facility] Gastroenterology [facility] Office # [phone]

## 2023-05-29 NOTE — Progress Notes (Signed)
Rx Refill

## 2023-05-29 NOTE — Progress Notes (Signed)
I spoke with the pt and she has been advised of the results.  She agrees with 6 month MRI MRCP and labs. She states she is going to continue care with Dr Jennye Boroughs.  She will reach out if she changes her mind in the future.

## 2023-06-05 ENCOUNTER — Other Ambulatory Visit: Payer: Self-pay

## 2023-06-05 DIAGNOSIS — R161 Splenomegaly, not elsewhere classified: Secondary | ICD-10-CM

## 2023-06-05 DIAGNOSIS — M898X9 Other specified disorders of bone, unspecified site: Secondary | ICD-10-CM

## 2023-06-05 DIAGNOSIS — G894 Chronic pain syndrome: Secondary | ICD-10-CM

## 2023-06-06 ENCOUNTER — Encounter: Payer: Self-pay | Admitting: Oncology

## 2023-06-06 MED ORDER — OXYCODONE HCL 10 MG PO TABS: 10.0000 mg | ORAL_TABLET | ORAL | 0 refills | Status: DC | PRN

## 2023-06-08 ENCOUNTER — Other Ambulatory Visit: Payer: Medicare Other

## 2023-06-08 ENCOUNTER — Ambulatory Visit: Payer: Medicare Other | Admitting: Oncology

## 2023-06-08 NOTE — Progress Notes (Incomplete)
[facility]  [address] [place],  [place]  [address] [phone]  Clinic Day: 05/09/23  Referring physician: Crist Fat, MD  ASSESSMENT & PLAN:  Assessment & Plan: Essential thrombocytosis [facility]) Essential thrombocythemia, well controlled with hydroxyurea 500 mg, was decreased from 4 times daily to 3 times daily since she had worsening anemia and leukopenia, and then decreased to twice daily in April 2024. In June, she had worsening leukopenia with the WBC  to 2.6, and worsening anemia, her platelets were normal at 274,000. Her hydroxyurea was stopped and her blood counts continue to remain low but stable. Today's labs are pending.  Anemia Her hemoglobin was stable at 10.3 earlier this year and has fluctuated up and down. The most recent was 10.1 in September, 2024.   Leukopenia  Her white count was down to 2.6 with a mild neutropenia. The ANC is 1500. Most likely this is from her hydroxyurea, and that has been stopped. Unfortunately she is also at risk for development of acute leukemia or myelofibrosis due to her myeloproliferative disorder as well as the hydroxyurea.   Family history of pancreatic cancer Two first-degree relatives, her brothers, with pancreatic cancer.  He declined genetic testing.  Genetic testing has been recommended for the patient, but not done due to lack of insurance coverage. That brother has expired but now her other brother ha also been diagnosed with pancreatic cancer and was unresectable at [facility]. She tells me he has now been referred to hospice. There are multiple other relatives with pancreatic cancer also.   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 This has slowly increased since 2016.  Now it has increased significantly to 3.8 cm in maximum diameter and appears to communicate with the main pancreatic duct. Her most recent  MRI of the abdomen was done in May, 2024 and revealed further enlargement of the pancreatic lesion to 4.5 cm. She has a strong family history for pancreatic cancer, her other brother has now been referred to hospice because it was unresectable  at the time of surgery . She has missed 3 appointment for a MRI of the abdomen to follow-up on the pancreatic cyst. She has been referred to gastroenterology in [facility] for an EUS and it was scheduled by Dr. Meridee Score on July 11th.  Her biopsy was rescheduled by Dr Meridee Score to August due to the severely elevated BP on July 11th. It is now rescheduled for 05/11/2023 and hopefully she will finally be able to proceed.   Esophageal Stricture She has an esophageal stricture at the GE junction with erosions and had dilation on April 30th by Dr. Jennye Boroughs .   Colon Polyps She last had colonoscopy in September 2017 and had a tubular adenoma removed from the sigmoid colon. He was unable to perform the colonoscopy due to poor prep. He will reschedule.   Chronic pain She is on chronic opioids due to bone pain from her myeloproliferative disease. She has also had severe abdominal pain for the last 6 months.  Old CVA She has had at least 1 if not several strokes related to her severe thrombocytosis. Fortunately her platelets are under control now, even off the medication. She may be evolving into a myelofibrosis but we will follow her blood counts for now. She had a CT of the head performed on 12/08/2022 that showed no acute intracranial abnormalities, chronic microvascular disease, brain atrophy, and  remote lacunar infarct within bilateral basal ganglia.    Plan:  She will have an EUS by the GI doctor in [place] done on 05/11/2023. Her BP today was 183/87 and when rechecked at the end of the appointment was 158/76. She continues to not take Hydrea. I will prescribe Oxycodone HCL 10mg . Her WBC was low at 2.6, hemoglobin low at 10.1, and platelet count normal at  224,000 as of 04/11/2023. Her potassium level was very low at 2.6 and a mildly low calcium of 8.6 as of 04/11/2023. She takes 2 potassium supplements daily but compliance is questionable and she had refused IV potassium last time.  Her labs today are pending. Her CA 19.9 has been mildly elevated in 50's but stable. She will see her PCP in 1.5 weeks. I will see her back in 1 month with CBC and CMP to review the findings of her EUS. The patient understands the plans discussed today and is in agreement with them.  She knows to contact our office if she develops concerns prior to her next appointment.  I provided 11 minutes of face-to-face time during this this encounter and > 50% was spent counseling as documented under my assessment and plan.    Gery Pray MD  [facility] AT [facility] [address] [place] [address] Dept: [phone] Dept Fax: [phone]    CHIEF COMPLAINT:  CC: Essential thrombocythemia  Current Treatment:  Hydroxyurea 500 mg 3 times daily   HISTORY OF PRESENT ILLNESS:  Allison Stevens is a 57 y.o. female with a history of essential thrombocythemia originally diagnosed in January 2005.  This has been treated with hydroxyurea, but the patient has often been non-compliant.  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  She has persistent pain in the left upper quadrant, attributed to splenomegaly, as well as bone pain, for which she remains on OxyContin with oxycodone for breakthrough.  We have tapered her opioids down over time. She saw Dr. Adriana Simas, her gastroenterologist at [facility], and underwent an esophageal dilatation in August 2016.  She had already had a colonoscopy due to previous gastrointestinal complaints.  We have been following a lesion in the pancreas seen incidentally on CT done in the emergency room for abdominal pain.  This has been felt to be a benign cyst.  Repeat MRI  abdomen in November 2016, revealed a slight increase in the benign appearing cyst of the pancreas.  No splenomegaly was seen.  Repeat MRI abdomen in 1 year was recommended.  She was hospitalized in September 2017 and underwent colonoscopy with removal of a small polyp in the sigmoid colon.  Small internal hemorrhoids were also seen.  Dr. Chales Abrahams also obtained a CEA and CA 19-9, and. the CA 19-9 was elevated at 145, the CEA was normal. Repeat MRI abdomen with contrast in September 2017 revealed a stable 14 mm cystic lesion in the pancreas.  Again, no splenomegaly was seen.  Follow-up MRI in 1 year was recommended.  Repeat MRI abdomen in March 2019 revealed a 17 mm cystic lesion in the pancreatic uncinate process, which was felt to be stable.  No significant liver lesions were seen.  Repeat MRI abdomen in 1 year was recommended.  Repeat MRI abdomen in September 2019 revealed a stable 19 mm cystic lesion of the pancreas, which appeared to be benign and still felt to be stable.  Repeat MRI abdomen with and without contrast in 1 year  was recommended.  MRI abdomen in October 2020 revealed a stable 17 mm cystic lesion of the pancreas.  Continued yearly follow up MRI abdomen was recommended.   Screening mammogram in February 2016 revealed a possible mass in the right breast.  Right diagnostic mammogram and ultrasound was done and revealed 2 well circumscribed hypoechoic lesions in the right breast at 11 o 'clock, felt to represent cysts.  Repeat mammogram and ultrasound in August revealed heterogeneous fibroglandular tissue felt to be benign, with resolution of the previously seen cysts and no masses.  Repeat diagnostic bilateral mammogram and ultrasound in 6 months was once again recommended.  Bilateral diagnostic mammogram in March 2017 did not reveal any evidence of malignancy, so 1 year follow-up screening mammogram was recommended.  Bilateral screening mammogram in March 2018 did not reveal any evidence of malignancy.   Bone density scan in June 2018 revealed osteopenia with a T-score of -1.5 in the femur.  The patient was instructed to take calcium and vitamin-D twice daily.  The patient's family history is significant in that her father had pancreatic cancer at age 25, a paternal uncle had pancreatic cancer and a paternal aunt had pancreatic cancer.  Her brother also had pancreatic cancer at age 57.  She is appropriate for genetic testing, but this has not been done yet due to insurance coverage.  Bilateral screening mammogram in December 2020 did not reveal any evidence of malignancy.    Repeat MRI abdomen in September 2021 revealed a 1.9 cm cystic lesion in the pancreatic uncinate process, which showed no significant change compared to more recent exams, but has shown a slow increase in size since 2016. 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 Follow-up by MRI in 2 years was recommended. She reports having another stroke in May 2022.  She then had COVID later in May and was hospitalized due to this.  She had been on hydroxyurea 4 times a day, but this was reduced in September to 3 times a day as her platelets were controlled and she was more anemic with a hemoglobin of 10. Repeat CBC in November revealed her hemoglobin to be back up to 11.6.  Her last colonoscopy was in September 2017 and she did have a polypectomy of a tubular adenoma of the sigmoid colon at that time.  He also had an EGD in April of 2018, which showed mild gastritis, a small hiatal hernia, and a Schatzki ring.  She also had esophageal dilation.  INTERVAL HISTORY:  Felesha is here today for repeat clinical assessment essential thrombocythemia and an enlarging pancreatic cyst. Patient states that she feels *** and ***.     She denies signs of infection such as sore throat, sinus drainage, cough, or urinary symptoms.  She denies fevers or recurrent chills. She denies pain. She  denies nausea, vomiting, chest pain, dyspnea or cough. Her appetite is *** and her weight {Weight change:10426}.  Patient states that she feels ok but complains of pain in her mid left upper abdomen. She will have an EUS by the GI doctor in [place] done on 05/11/2023. Her BP today was 183/87 and when rechecked at the end of the appointment was 158/76. She continues to remain off Hydrea. I will prescribe Oxycodone HCL 10mg . Her WBC was low at 2.6, hemoglobin low at 10.1, and platelet count normal at 224,000 as of 04/11/2023. Her potassium level was very low at 2.6 and a mildly low calcium  of 8.6 as of 04/11/2023. She takes 2 potassium supplements daily but compliance is questionable and she had refused IV potassium last time.  Her labs today are pending. Her CA 19.9 has been mildly elevated in 50's but stable. She will see her PCP in 1.5 weeks. I will see her back in 1 month with CBC and CMP to review the findings of her EUS. She denies signs of infection such as sore throat, sinus drainage, cough, or urinary symptoms.  She denies fevers or recurrent chills. She denies pain. She denies nausea, vomiting, chest pain, dyspnea or cough. Her appetite is off and on and her weight has decreased 2 pounds over last month .    REVIEW OF SYSTEMS:  Review of Systems  Constitutional:  Positive for fatigue. Negative for appetite change, chills, diaphoresis, fever and unexpected weight change.  HENT:   Negative for hearing loss, lump/mass, mouth sores, nosebleeds, sore throat, tinnitus, trouble swallowing and voice change.   Eyes: Negative.  Negative for eye problems and icterus.  Respiratory: Negative.  Negative for chest tightness, cough, hemoptysis, shortness of breath and wheezing.   Cardiovascular: Negative.  Negative for chest pain, leg swelling and palpitations.  Gastrointestinal:  Positive for abdominal pain. Negative for abdominal distention, blood in stool, constipation, diarrhea, nausea, rectal pain and  vomiting.  Endocrine: Negative.  Negative for hot flashes.  Genitourinary: Negative.  Negative for bladder incontinence, difficulty urinating, dyspareunia, dysuria, frequency, hematuria, menstrual problem, nocturia, pelvic pain, vaginal bleeding and vaginal discharge.   Musculoskeletal:  Positive for arthralgias (right hip since a fall) and gait problem (limping due to previous stroke, has worsened). Negative for back pain, flank pain, myalgias, neck pain and neck stiffness.       Bones hurt  Skin: Negative.  Negative for itching, rash and wound.  Neurological:  Positive for extremity weakness and gait problem (limping due to previous stroke, has worsened). Negative for dizziness, headaches, light-headedness, numbness, seizures and speech difficulty.  Hematological:  Negative for adenopathy. Bruises/bleeds easily.  Psychiatric/Behavioral:  Positive for depression. Negative for confusion, decreased concentration, sleep disturbance and suicidal ideas. The patient is not nervous/anxious.      VITALS:  There were no vitals taken for this visit.  Wt Readings from Last 3 Encounters:  05/11/23 134 lb (60.8 kg)  05/09/23 138 lb 8 oz (62.8 kg)  04/11/23 140 lb (63.5 kg)    There is no height or weight on file to calculate BMI.  Performance status (ECOG): 1 - Symptomatic but completely ambulatory  PHYSICAL EXAM:  Physical Exam Vitals and nursing note reviewed.  Constitutional:      General: She is not in acute distress.    Appearance: Normal appearance. She is normal weight. She is not ill-appearing, toxic-appearing or diaphoretic.  HENT:     Head: Normocephalic and atraumatic.     Right Ear: Tympanic membrane, ear canal and external ear normal. There is no impacted cerumen.     Left Ear: Tympanic membrane, ear canal and external ear normal. There is no impacted cerumen.     Nose: Nose normal. No congestion or rhinorrhea.     Mouth/Throat:     Mouth: Mucous membranes are moist.     Pharynx:  Oropharynx is clear. No oropharyngeal exudate or posterior oropharyngeal erythema.  Eyes:     General: No scleral icterus.       Right eye: No discharge.        Left eye: No discharge.     Extraocular Movements:  Extraocular movements intact.     Conjunctiva/sclera: Conjunctivae normal.     Pupils: Pupils are equal, round, and reactive to light.  Neck:     Vascular: No carotid bruit.  Cardiovascular:     Rate and Rhythm: Normal rate and regular rhythm.     Pulses: Normal pulses.     Heart sounds: Normal heart sounds. No murmur heard.    No friction rub. No gallop.  Pulmonary:     Effort: Pulmonary effort is normal. No respiratory distress.     Breath sounds: Normal breath sounds. No stridor. No wheezing, rhonchi or rales.  Chest:     Chest wall: No tenderness.  Abdominal:     General: Bowel sounds are normal. There is no distension.     Palpations: Abdomen is soft. There is no hepatomegaly, splenomegaly or mass.     Tenderness: There is abdominal tenderness in the left upper quadrant. There is no right CVA tenderness, left CVA tenderness, guarding or rebound.     Hernia: No hernia is present.     Comments: Abdomen is mildly distended   Musculoskeletal:        General: No swelling, tenderness, deformity or signs of injury. Normal range of motion.     Cervical back: Normal range of motion and neck supple. No rigidity or tenderness.     Right lower leg: No edema.     Left lower leg: No edema.  Lymphadenopathy:     Cervical: No cervical adenopathy.     Upper Body:     Right upper body: No supraclavicular or axillary adenopathy.     Left upper body: No supraclavicular or axillary adenopathy.     Lower Body: No right inguinal adenopathy. No left inguinal adenopathy.  Skin:    General: Skin is warm and dry.     Coloration: Skin is not jaundiced or pale.     Findings: No abrasion, bruising, erythema, lesion or rash.     Comments:    Neurological:     General: No focal deficit  present.     Mental Status: She is alert and oriented to person, place, and time. Mental status is at baseline.     Cranial Nerves: No cranial nerve deficit.     Sensory: No sensory deficit.     Motor: No weakness.     Coordination: Coordination normal.     Gait: Gait abnormal.     Deep Tendon Reflexes: Reflexes normal.     Comments: Weak lower extremities  Psychiatric:        Mood and Affect: Mood normal.        Behavior: Behavior normal.        Thought Content: Thought content normal.        Judgment: Judgment normal.    LABS:      Latest Ref Rng & Units 05/09/2023    1:49 PM 04/11/2023   10:54 AM 02/14/2023   10:38 AM  CBC  WBC 4.0 - 10.5 K/uL 3.2  2.6  2.8   Hemoglobin 12.0 - 15.0 g/dL 81.1  91.4  78.2   Hematocrit 36.0 - 46.0 % 32.0  31.0  32.5   Platelets 150 - 400 K/uL 272  224  265       Latest Ref Rng & Units 05/09/2023    1:49 PM 04/11/2023   10:54 AM 02/14/2023   10:38 AM  CMP  Glucose 70 - 99 mg/dL 956  213  086   BUN 6 -  20 mg/dL 13  8  9    Creatinine 0.44 - 1.00 mg/dL 8.29  5.62  1.30   Sodium 135 - 145 mmol/L 134  138  137   Potassium 3.5 - 5.1 mmol/L 3.5  2.6  2.8   Chloride 98 - 111 mmol/L 100  100  98   CO2 22 - 32 mmol/L 24  27  30    Calcium 8.9 - 10.3 mg/dL 9.1  8.6  8.7   Total Protein 6.5 - 8.1 g/dL 7.2  7.1  7.0   Total Bilirubin 0.3 - 1.2 mg/dL 0.5  0.8  0.6   Alkaline Phos 38 - 126 U/L 86  70  69   AST 15 - 41 U/L 18  26  21    ALT 0 - 44 U/L 23  27  23     Lab Results  Component Value Date   CEA1 1.9 11/16/2022   /  CEA  Date Value Ref Range Status  11/16/2022 1.9 0.0 - 4.7 ng/mL Final    Comment:    (NOTE)                             Nonsmokers          <3.9                             Smokers             <5.6 [employer] Electrochemiluminescence Immunoassay (ECLIA) Values obtained with different assay methods or kits cannot be used interchangeably.  Results cannot be interpreted as absolute evidence of the presence or absence  of malignant disease. Performed At: [facility] [address] [place], [place] [id number] Jolene Schimke MD EX:[id number]    No results found for: "PSA1" Lab Results  Component Value Date   NUU725 26 (H) 05/09/2023   No results found for: "CAN125"  No results found for: "TOTALPROTELP", "ALBUMINELP", "A1GS", "A2GS", "BETS", "BETA2SER", "GAMS", "MSPIKE", "SPEI" Lab Results  Component Value Date   TIBC 259 08/08/2022   TIBC 301 11/09/2021   FERRITIN 134 08/08/2022   FERRITIN 59 11/09/2021   IRONPCTSAT 42 08/08/2022   IRONPCTSAT 26 11/09/2021   No results found for: "LDH"  STUDIES:           HISTORY:   Past Medical History:  Diagnosis Date   Acid reflux    Anxiety    Carpal tunnel syndrome    Cerebrovascular disease    Diabetic retinopathy (HCC)    Family history of pancreatic cancer 08/09/2021   Gait abnormality    History of stroke    Hyperlipidemia    Hypertension    Migraine    Pancreatic cyst 08/09/2021   Retinal detachment    Thrombocytosis    Trigger finger    Ulnar nerve entrapment     Past Surgical History:  Procedure Laterality Date   BIOPSY  05/11/2023   Procedure: BIOPSY;  Surgeon: Lemar Lofty., MD;  Location: [facility];  Service: Gastroenterology;;   CESAREAN SECTION  1997   ESOPHAGOGASTRODUODENOSCOPY N/A 05/11/2023   Procedure: ESOPHAGOGASTRODUODENOSCOPY (EGD);  Surgeon: Lemar Lofty., MD;  Location: [facility];  Service: Gastroenterology;  Laterality: N/A;   EUS N/A 05/11/2023   Procedure: UPPER ENDOSCOPIC ULTRASOUND (EUS) RADIAL;  Surgeon: Lemar Lofty., MD;  Location: [facility];  Service: Gastroenterology;  Laterality: N/A;   FINE NEEDLE ASPIRATION N/A 05/11/2023  Procedure: FINE NEEDLE ASPIRATION (FNA) LINEAR;  Surgeon: Lemar Lofty., MD;  Location: [facility];  Service: Gastroenterology;  Laterality: N/A;   GALLBLADDER SURGERY  2011   Family History  Problem Relation Age  of Onset   Diabetes Mother    Cancer Mother    Pancreatic cancer Father    Pancreatic cancer Brother    Pancreatic cancer Brother    Healthy Child    Pancreatic cancer Paternal Uncle   Her brother recently had surgery for pancreatic cancer but it was unresectable and he has now been referred to hospice and her other brother had previously died of pancreatic cancer and she reminded me that she has had her father, 2 aunts, and 3 uncles that have died, presumably from cancer.    Social History:  reports that she has never smoked. She has never used smokeless tobacco. She reports that she does not drink alcohol and does not use drugs.The patient is alone today.  Allergies:  Allergies  Allergen Reactions   Iodinated Contrast Media Other (See Comments) and Shortness Of Breath   Morphine Itching and Other (See Comments)    "Makes me feel out of my mind" per pt.    Current Medications: Current Outpatient Medications  Medication Sig Dispense Refill   ALPRAZolam (XANAX) 0.5 MG tablet Take 1 tablet (0.5 mg total) by mouth every 8 (eight) hours as needed for anxiety. 90 tablet 2   amLODipine (NORVASC) 10 MG tablet TAKE 1 TABLET BY MOUTH EVERY DAY 90 tablet 0   aspirin 81 MG chewable tablet Chew 1 tablet by mouth daily.     atorvastatin (LIPITOR) 40 MG tablet Take 1 tablet (40 mg total) by mouth daily. 90 tablet 1   BD PEN NEEDLE NANO 2ND GEN 32G X 4 MM MISC CHECK BLOOD SUGAR THREE TIMES A DAY 100 each 3   carvedilol (COREG) 12.5 MG tablet TAKE 1 TABLET BY MOUTH 2 TIMES DAILY. 180 tablet 0   clopidogrel (PLAVIX) 75 MG tablet Take 1 tablet (75 mg total) by mouth daily.     diclofenac Sodium (VOLTAREN) 1 % GEL Apply 4 g topically 4 (four) times daily.     enalapril (VASOTEC) 20 MG tablet Take 20 mg by mouth 2 (two) times daily.     famotidine (PEPCID) 40 MG tablet Take 40 mg by mouth at bedtime.     fluticasone (FLONASE) 50 MCG/ACT nasal spray PLACE 1 SPRAY INTO BOTH NOSTRILS IN THE MORNING AND AT  BEDTIME. 48 mL 1   fluticasone (FLOVENT HFA) 44 MCG/ACT inhaler      glimepiride (AMARYL) 2 MG tablet Take by mouth.     ibuprofen (ADVIL,MOTRIN) 800 MG tablet Take 800 mg by mouth every 8 (eight) hours as needed.     Insulin Detemir (LEVEMIR) 100 UNIT/ML Pen Inject into the skin.     metFORMIN (GLUCOPHAGE) 1000 MG tablet TAKE 1 TABLET (1,000 MG TOTAL) BY MOUTH TWICE A DAY WITH FOOD 180 tablet 0   NOVOLOG FLEXPEN 100 UNIT/ML FlexPen INJECT 34 UNIT(S) INTO THE SKIN 3 TIMES DAILY BEFORE MEALS 15 mL 3   ondansetron (ZOFRAN) 4 MG tablet Take 1 tablet (4 mg total) by mouth every 8 (eight) hours as needed for nausea or vomiting. 20 tablet 0   Oxycodone HCl 10 MG TABS Take 1 tablet (10 mg total) by mouth every 4 (four) hours as needed. 120 tablet 0   pantoprazole (PROTONIX) 40 MG tablet Take 1 tablet (40 mg total) by mouth daily. 30  tablet 6   potassium chloride SA (KLOR-CON M) 20 MEQ tablet Take 20 mEq by mouth 2 (two) times daily.     prochlorperazine (COMPAZINE) 10 MG tablet Take 10 mg by mouth every 6 (six) hours as needed for nausea or vomiting.     TOUJEO MAX SOLOSTAR 300 UNIT/ML Solostar Pen SMARTSIG:80 Unit(s) SUB-Q Every Night 300 mL 1   No current facility-administered medications for this visit.    I,Jasmine M Lassiter,acting as a scribe for Dellia Beckwith, MD.,have documented all relevant documentation on the behalf of Dellia Beckwith, MD,as directed by  Dellia Beckwith, MD while in the presence of Dellia Beckwith, MD.

## 2023-06-22 ENCOUNTER — Other Ambulatory Visit: Payer: Medicare Other

## 2023-06-22 ENCOUNTER — Ambulatory Visit: Payer: Medicare Other | Admitting: Oncology

## 2023-06-27 ENCOUNTER — Other Ambulatory Visit: Payer: Self-pay

## 2023-06-27 DIAGNOSIS — M898X9 Other specified disorders of bone, unspecified site: Secondary | ICD-10-CM

## 2023-06-27 DIAGNOSIS — R161 Splenomegaly, not elsewhere classified: Secondary | ICD-10-CM

## 2023-06-27 DIAGNOSIS — G894 Chronic pain syndrome: Secondary | ICD-10-CM

## 2023-06-27 MED ORDER — OXYCODONE HCL 10 MG PO TABS: 10.0000 mg | ORAL_TABLET | ORAL | 0 refills | Status: DC | PRN

## 2023-07-11 ENCOUNTER — Ambulatory Visit: Payer: Medicare Other | Admitting: Internal Medicine

## 2023-07-11 ENCOUNTER — Encounter: Payer: Self-pay | Admitting: Internal Medicine

## 2023-07-11 VITALS — BP 136/84 | HR 79 | Temp 98.1°F | Resp 18 | Ht 64.0 in | Wt 139.6 lb

## 2023-07-11 DIAGNOSIS — E1165 Type 2 diabetes mellitus with hyperglycemia: Secondary | ICD-10-CM | POA: Insufficient documentation

## 2023-07-11 DIAGNOSIS — I679 Cerebrovascular disease, unspecified: Secondary | ICD-10-CM | POA: Diagnosis not present

## 2023-07-11 DIAGNOSIS — I10 Essential (primary) hypertension: Secondary | ICD-10-CM | POA: Diagnosis not present

## 2023-07-11 DIAGNOSIS — E7800 Pure hypercholesterolemia, unspecified: Secondary | ICD-10-CM | POA: Insufficient documentation

## 2023-07-11 MED ORDER — ALPRAZOLAM 0.5 MG PO TABS: 0.5000 mg | ORAL_TABLET | Freq: Three times a day (TID) | ORAL | 2 refills | Status: DC | PRN

## 2023-07-11 NOTE — Assessment & Plan Note (Signed)
She had a new stroke in 11/2022 and received therapies.  We will cotninue on her statin and her ASA and plavix.  She states that the therapy team taught her exercises that she is doing at home.  I am still going to refer her to neurology.

## 2023-07-11 NOTE — Assessment & Plan Note (Signed)
Her goal LDL will be less than 55 with her history of diabetes and cerebrovascular disease.  I did increase her lipitor and we will check her FLP today.

## 2023-07-11 NOTE — Assessment & Plan Note (Signed)
Her BP is under ok control.  We will continue her current meds and continue to monitor.

## 2023-07-11 NOTE — Progress Notes (Signed)
Office Visit  Subjective   Patient ID: Allison Stevens   DOB: 02-24-1966   Age: 57 y.o.   MRN: [id number]   Chief Complaint Chief Complaint  Patient presents with   Follow-up     History of Present Illness The patient is a 57 year old African American/Black female who returns for a follow-up visit for her T2 diabetes.  This past year, her A1c was elevated and I tried to refer her to endocrinology.  I am told by my referral clerk that none of the endocrinologists in the area will take her due to her having appointments in the past and she would no show (noncompliance).  She did see my NP in 06/2022 where she was reporting some hypoglycemia and reported drinking a lot of soda.  She remains Toujeo 80 Units daily, Trulicity 4.5mg  subcut once a week, Novolog 35 Units subcut usually takes 3 times a day with meals and metformin 1000mg  BID.  She is not walking as much as they would like.  She specifically denies shortness of breath, unexplained fatigue, palpitations or racing heartbeats, syncopal episodes, and unexplained abdominal pain, nausea or vomiting.  She denies any hypoglycemia at this time.  She is now checking her blood sugars every day and they range 130-190.  She came in fasting today in anticipation of lab work. Her last HgbA1c was done in 11/09/2022 and was 7.5%.  She has complications with diabetes with a history of diabetic retinopathy.  There are no complications of diabetic neuropathy, nephropathy or vascular disease.  Her last dilated eye exam was done on 02/03/2022 that showed mild diabetic retinopathy with mild paramacular edema in her eyes.  He wants to see her back in 1 year.    The patient is a 57 year old African American/Black female who presents for a follow-up evaluation of hypertension.  Since her last visit, she denies any problems.  Since her last visit, she has not had any problems.  The patient has been checking her blood pressure at home. The patient's blood pressure  has ranged systollically from 130-140's. SThe patient's current medications include: amlodpine 10mg  daily, enalapril maleate 20 mg BID, and hydrochlorothiazide 12.5 mg daily.   The patient has been tolerating her medications well. The patient denies any visual changes, lightheadness, shortness of breath, orthopnea, and weakness/numbness. She reports there have been no other symptoms noted.    Shylynn A. Neuzil returns today for routine followup on her cholesterol. On her last visit, her cholesterol was not controlled with her history of stroke.  We increased her atorvastatin from 40mg  to 80mg  at bedtime.  Overall, she states she is doing well and is without any complaints or problems at this time. She specifically denies chest pain, abdominal pain, nausea, diarrhea, and myalgias. She remains on dietary management as well as the following cholesterol lowering medications atorvastatin 80 mg oral tablet qhs. She is fasting in anticipation for labs today.   The patient also had a recurrent stroke when she came to see me in 11/2022.  She saw me on 12/08/2022 where she came in for an acute visit with trouble with walking which started 2-3 weeks prior.  She has had had multiple strokes related to her thrombocytosis where she has residual sequalae of left sided weakness but was able to use that side.  She has not been following with Neurology.She stated that she began having worsening weakness of her left side of both her arm and her leg that started  2-3 weeks prior.  There was no weakness of her right side.  She actually had about 4 falls with abrasions to her knees. The patient denied any dizziness but did complain of worsening left sided weakness where she had to hold on to things to walk and she began using a rolling walker.  She did follow with Hematology with Dr. Gilman Buttner where she last saw her in 10/2022 where her thrombocytosis was controlled and she was noted to have a platelet count on 11/21/2022 of 272.  She is  currently treated with hydroxyruea.  The patient was diagnosed with thrombocytosis in 2005.  This new weakness was not effecting her face, speech, vision or memory.  I did a CT scan of her head on 12/08/2022 and there was no acute intracranial abnormality.  There was chronic microvascular disease and brain atrophy and remote lacunar infarcts within her bilateral basal ganglia.  We therefore did a MRI of brain which was scheduled on 12/20/2022 and this showed a punctate acute to subacute lacunar infarct in the posterior left lentiform.  There was no associated hemorrhage or mass effect.  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  I did refer her to see neurology but somehow this was not done over the interim.  We did get therapies involved per the patient.  She is having problems with balance and continues to have falls at times.  I did increase her lipitor from 40mg  to 80mg  at bedtime and she was continued on ASA 325mg  daily and plavix 75mg  daily.  The patient was supposed to come in for followup but we have had problems with compliance and she has not been seen in 6 months by myself.     Past Medical History Past Medical History:  Diagnosis Date   Acid reflux    Anxiety    Carpal tunnel syndrome    Cerebrovascular disease    Diabetic retinopathy (HCC)    Family history of pancreatic cancer 08/09/2021   Gait abnormality    History of stroke    Hyperlipidemia    Hypertension    Migraine    Pancreatic cyst 08/09/2021   Retinal detachment    Thrombocytosis    Trigger finger    Ulnar nerve entrapment      Allergies Allergies  Allergen Reactions   Iodinated Contrast Media Other (See Comments) and Shortness Of Breath   Morphine Itching and Other (See Comments)    "Makes me feel out of my mind" per pt.     Medications  Current Outpatient Medications:    ALPRAZolam (XANAX) 0.5 MG tablet, Take 1  tablet (0.5 mg total) by mouth every 8 (eight) hours as needed for anxiety., Disp: 90 tablet, Rfl: 2   amLODipine (NORVASC) 10 MG tablet, TAKE 1 TABLET BY MOUTH EVERY DAY, Disp: 90 tablet, Rfl: 0   aspirin 81 MG chewable tablet, Chew 1 tablet by mouth daily., Disp: , Rfl:    atorvastatin (LIPITOR) 40 MG tablet, Take 1 tablet (40 mg total) by mouth daily., Disp: 90 tablet, Rfl: 1   BD PEN NEEDLE NANO 2ND GEN 32G X 4 MM MISC, CHECK BLOOD SUGAR THREE TIMES A DAY, Disp: 100 each, Rfl: 3   carvedilol (COREG) 12.5 MG tablet, TAKE 1 TABLET BY MOUTH 2 TIMES DAILY., Disp: 180 tablet, Rfl: 0   clopidogrel (PLAVIX) 75 MG tablet, Take 1 tablet (75 mg total) by mouth daily.,  Disp: , Rfl:    diclofenac Sodium (VOLTAREN) 1 % GEL, Apply 4 g topically 4 (four) times daily., Disp: , Rfl:    enalapril (VASOTEC) 20 MG tablet, Take 20 mg by mouth 2 (two) times daily., Disp: , Rfl:    famotidine (PEPCID) 40 MG tablet, Take 40 mg by mouth at bedtime., Disp: , Rfl:    fluticasone (FLONASE) 50 MCG/ACT nasal spray, PLACE 1 SPRAY INTO BOTH NOSTRILS IN THE MORNING AND AT BEDTIME., Disp: 48 mL, Rfl: 1   fluticasone (FLOVENT HFA) 44 MCG/ACT inhaler, , Disp: , Rfl:    glimepiride (AMARYL) 2 MG tablet, Take by mouth., Disp: , Rfl:    ibuprofen (ADVIL,MOTRIN) 800 MG tablet, Take 800 mg by mouth every 8 (eight) hours as needed., Disp: , Rfl:    Insulin Detemir (LEVEMIR) 100 UNIT/ML Pen, Inject into the skin., Disp: , Rfl:    metFORMIN (GLUCOPHAGE) 1000 MG tablet, TAKE 1 TABLET (1,000 MG TOTAL) BY MOUTH TWICE A DAY WITH FOOD, Disp: 180 tablet, Rfl: 0   NOVOLOG FLEXPEN 100 UNIT/ML FlexPen, INJECT 34 UNIT(S) INTO THE SKIN 3 TIMES DAILY BEFORE MEALS, Disp: 15 mL, Rfl: 3   ondansetron (ZOFRAN) 4 MG tablet, Take 1 tablet (4 mg total) by mouth every 8 (eight) hours as needed for nausea or vomiting., Disp: 20 tablet, Rfl: 0   Oxycodone HCl 10 MG TABS, Take 1 tablet (10 mg total) by mouth every 4 (four) hours as needed., Disp: 120 tablet,  Rfl: 0   pantoprazole (PROTONIX) 40 MG tablet, Take 1 tablet (40 mg total) by mouth daily., Disp: 30 tablet, Rfl: 6   potassium chloride SA (KLOR-CON M) 20 MEQ tablet, Take 20 mEq by mouth 2 (two) times daily., Disp: , Rfl:    prochlorperazine (COMPAZINE) 10 MG tablet, Take 10 mg by mouth every 6 (six) hours as needed for nausea or vomiting., Disp: , Rfl:    TOUJEO MAX SOLOSTAR 300 UNIT/ML Solostar Pen, SMARTSIG:80 Unit(s) SUB-Q Every Night, Disp: 300 mL, Rfl: 1   Review of Systems Review of Systems  Constitutional:  Negative for chills, fever and malaise/fatigue.  Eyes:  Negative for blurred vision and double vision.  Respiratory:  Negative for shortness of breath.   Cardiovascular:  Negative for chest pain, palpitations and leg swelling.  Gastrointestinal:  Negative for abdominal pain, constipation, diarrhea, nausea and vomiting.  Genitourinary:  Negative for frequency.  Musculoskeletal:  Negative for myalgias.  Skin:  Negative for itching and rash.  Neurological:  Negative for dizziness, weakness and headaches.  Endo/Heme/Allergies:  Negative for polydipsia.       Objective:    Vitals BP 136/84   Pulse 79   Temp 98.1 F (36.7 C)   Resp 18   Ht 5\' 4"  (1.626 m)   Wt 139 lb 9.6 oz (63.3 kg)   SpO2 96%   BMI 23.96 kg/m    Physical Examination Physical Exam Constitutional:      Appearance: Normal appearance. She is not ill-appearing.  Cardiovascular:     Rate and Rhythm: Normal rate and regular rhythm.     Pulses: Normal pulses.     Heart sounds: No murmur heard.    No friction rub. No gallop.  Pulmonary:     Effort: Pulmonary effort is normal. No respiratory distress.     Breath sounds: No wheezing, rhonchi or rales.  Abdominal:     General: Bowel sounds are normal. There is no distension.     Palpations: Abdomen is soft.  Tenderness: There is no abdominal tenderness.  Musculoskeletal:     Right lower leg: No edema.     Left lower leg: No edema.  Skin:     General: Skin is warm and dry.     Findings: No rash.  Neurological:     Mental Status: She is alert.     Comments: CN II-XII are full intact.  Her strength is 5/5 in all extremities except for her more proximal left leg where her strength is 4+/5        Assessment & Plan:   Essential hypertension Her BP is under ok control.  We will continue her current meds and continue to monitor.  Cerebrovascular disease She had a new stroke in 11/2022 and received therapies.  We will cotninue on her statin and her ASA and plavix.  She states that the therapy team taught her exercises that she is doing at home.  I am still going to refer her to neurology.  Inadequately controlled diabetes mellitus (HCC) We will check a HgBA1c today with goal <7%.    Hypercholesterolemia Her goal LDL will be less than 55 with her history of diabetes and cerebrovascular disease.  I did increase her lipitor and we will check her FLP today.    Return in about 3 months (around 10/09/2023) for annual.   Crist Fat, MD

## 2023-07-11 NOTE — Assessment & Plan Note (Signed)
We will check a HgBA1c today with goal <7%.

## 2023-07-12 LAB — LIPID PANEL
Chol/HDL Ratio: 3.9 {ratio} (ref 0.0–4.4)
Cholesterol, Total: 152 mg/dL (ref 100–199)
HDL: 39 mg/dL — ABNORMAL LOW (ref >39)
LDL Chol Calc (NIH): 98 mg/dL (ref 0–99)
Triglycerides: 79 mg/dL (ref 0–149)
VLDL Cholesterol Cal: 15 mg/dL (ref 5–40)

## 2023-07-12 LAB — CMP14 + ANION GAP
ALT: 18 [IU]/L (ref 0–32)
AST: 18 [IU]/L (ref 0–40)
Albumin: 3.9 g/dL (ref 3.8–4.9)
Alkaline Phosphatase: 112 [IU]/L (ref 44–121)
Anion Gap: 15 mmol/L (ref 10.0–18.0)
BUN/Creatinine Ratio: 9 (ref 9–23)
BUN: 9 mg/dL (ref 6–24)
Bilirubin Total: 0.4 mg/dL (ref 0.0–1.2)
CO2: 23 mmol/L (ref 20–29)
Calcium: 9.2 mg/dL (ref 8.7–10.2)
Chloride: 103 mmol/L (ref 96–106)
Creatinine, Ser: 0.95 mg/dL (ref 0.57–1.00)
Globulin, Total: 2.5 g/dL (ref 1.5–4.5)
Glucose: 137 mg/dL — ABNORMAL HIGH (ref 70–99)
Potassium: 4.2 mmol/L (ref 3.5–5.2)
Sodium: 141 mmol/L (ref 134–144)
Total Protein: 6.4 g/dL (ref 6.0–8.5)
eGFR: 69 mL/min/{1.73_m2} (ref >59)

## 2023-07-12 LAB — HEMOGLOBIN A1C
Est. average glucose Bld gHb Est-mCnc: 229 mg/dL
Hgb A1c MFr Bld: 9.6 % — ABNORMAL HIGH (ref 4.8–5.6)

## 2023-07-18 ENCOUNTER — Other Ambulatory Visit: Payer: Self-pay

## 2023-07-18 DIAGNOSIS — E7800 Pure hypercholesterolemia, unspecified: Secondary | ICD-10-CM

## 2023-07-18 MED ORDER — ROSUVASTATIN CALCIUM 20 MG PO TABS: 20.0000 mg | ORAL_TABLET | Freq: Every day | ORAL | 11 refills | Status: DC

## 2023-07-18 NOTE — Progress Notes (Signed)
New Rx

## 2023-07-18 NOTE — Progress Notes (Signed)
Her diabetes is a lot worse. Is she really taking her medicines? Ask her that and get back to me. Her cholesterol is not controlled. Change her lipitor to crestor 20mg  nightly. Get back to me on her answer regarding her diabetic meds  Patient is aware of labs and new med; crestor 20 mg nightly

## 2023-07-20 ENCOUNTER — Other Ambulatory Visit: Payer: Self-pay | Admitting: Internal Medicine

## 2023-07-24 ENCOUNTER — Other Ambulatory Visit: Payer: Self-pay

## 2023-07-24 DIAGNOSIS — M898X9 Other specified disorders of bone, unspecified site: Secondary | ICD-10-CM

## 2023-07-24 DIAGNOSIS — G894 Chronic pain syndrome: Secondary | ICD-10-CM

## 2023-07-24 DIAGNOSIS — R161 Splenomegaly, not elsewhere classified: Secondary | ICD-10-CM

## 2023-07-24 MED ORDER — OXYCODONE HCL 10 MG PO TABS: 10.0000 mg | ORAL_TABLET | ORAL | 0 refills | Status: DC | PRN

## 2023-07-28 ENCOUNTER — Ambulatory Visit: Payer: Medicare Other | Admitting: Oncology

## 2023-07-28 ENCOUNTER — Other Ambulatory Visit: Payer: Medicare Other

## 2023-08-07 ENCOUNTER — Telehealth: Payer: Self-pay | Admitting: Oncology

## 2023-08-07 NOTE — Telephone Encounter (Signed)
 08/07/23 Spoke with patient and rescheduled next appt.

## 2023-08-21 ENCOUNTER — Other Ambulatory Visit: Payer: Self-pay

## 2023-08-21 DIAGNOSIS — G894 Chronic pain syndrome: Secondary | ICD-10-CM

## 2023-08-21 DIAGNOSIS — M898X9 Other specified disorders of bone, unspecified site: Secondary | ICD-10-CM

## 2023-08-21 DIAGNOSIS — R161 Splenomegaly, not elsewhere classified: Secondary | ICD-10-CM

## 2023-08-22 ENCOUNTER — Encounter: Payer: Self-pay | Admitting: Oncology

## 2023-08-22 MED ORDER — OXYCODONE HCL 10 MG PO TABS: 10.0000 mg | ORAL_TABLET | ORAL | 0 refills | Status: DC | PRN

## 2023-08-29 ENCOUNTER — Inpatient Hospital Stay: Payer: Medicare Other

## 2023-08-29 ENCOUNTER — Inpatient Hospital Stay: Payer: Medicare Other | Admitting: Oncology

## 2023-08-29 ENCOUNTER — Telehealth: Payer: Self-pay | Admitting: Hematology and Oncology

## 2023-08-29 NOTE — Progress Notes (Incomplete)
[facility]  [address] [place],  [place]  [address] [phone]  Clinic Day: 08/29/23   Referring physician: Crist Fat, MD  ASSESSMENT & PLAN:  Assessment: Essential thrombocytosis [facility]) Essential thrombocythemia, well controlled with hydroxyurea 500 mg, was decreased from 4 times daily to 3 times daily since she had worsening anemia and leukopenia, and then decreased to twice daily in April 2024. In June, she had worsening leukopenia with the WBC  to 2.6, and worsening anemia, her platelets were normal at 274,000. Her hydroxyurea was stopped and her blood counts continue to remain low but stable. Today's labs are pending.  Anemia Her hemoglobin was stable at 10.3 earlier this year and has fluctuated up and down. The most recent was 10.1 in September, 2024.   Leukopenia  Her white count was down to 2.6 with a mild neutropenia. The ANC is 1500. Most likely this is from her hydroxyurea, and that has been stopped. Unfortunately she is also at risk for development of acute leukemia or myelofibrosis due to her myeloproliferative disorder as well as the hydroxyurea.   Family history of pancreatic cancer Two first-degree relatives, her brothers, with pancreatic cancer.  He declined genetic testing.  Genetic testing has been recommended for the patient, but not done due to lack of insurance coverage. That brother has expired but now her other brother ha also been diagnosed with pancreatic cancer and was unresectable at [facility]. She tells me he has now been referred to hospice. There are multiple other relatives with pancreatic cancer also.   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 This has slowly increased since 2016.  Now it has increased significantly to 3.8 cm in maximum diameter and appears to communicate with the main pancreatic duct. Her most recent MRI of  the abdomen was done in May, 2024 and revealed further enlargement of the pancreatic lesion to 4.5 cm. She has a strong family history for pancreatic cancer, her other brother has now been referred to hospice because it was unresectable  at the time of surgery . She has missed 3 appointment for a MRI of the abdomen to follow-up on the pancreatic cyst. She has been referred to gastroenterology in [facility] for an EUS and it was scheduled by Dr. Meridee Score on July 11th.  Her biopsy was rescheduled by Dr Meridee Score to August due to the severely elevated BP on July 11th. It is now rescheduled for 05/11/2023 and hopefully she will finally be able to proceed.   Esophageal Stricture She has an esophageal stricture at the GE junction with erosions and had dilation on April 30th by Dr. Jennye Boroughs .   Colon Polyps She last had colonoscopy in September 2017 and had a tubular adenoma removed from the sigmoid colon. He was unable to perform the colonoscopy due to poor prep. He will reschedule.   Chronic pain She is on chronic opioids due to bone pain from her myeloproliferative disease. She has also had severe abdominal pain for the last 6 months.  Old CVA She has had at least 1 if not several strokes related to her severe thrombocytosis. Fortunately her platelets are under control now, even off the medication. She may be evolving into a myelofibrosis but we will follow her blood counts for now. She had a CT of the head performed on 12/08/2022 that showed no acute intracranial abnormalities, chronic microvascular disease, brain atrophy, and remote  lacunar infarct within bilateral basal ganglia.    Plan:  She will have an EUS by the GI doctor in [place] done on 05/11/2023. Her BP today was 183/87 and when rechecked at the end of the appointment was 158/76. She continues to not take Hydrea. I will prescribe Oxycodone HCL 10mg . Her WBC was low at 2.6, hemoglobin low at 10.1, and platelet count normal at 224,000  as of 04/11/2023. Her potassium level was very low at 2.6 and a mildly low calcium of 8.6 as of 04/11/2023. She takes 2 potassium supplements daily but compliance is questionable and she had refused IV potassium last time.  Her labs today are pending. Her CA 19.9 has been mildly elevated in 50's but stable. She will see her PCP in 1.5 weeks. I will see her back in 1 month with CBC and CMP to review the findings of her EUS. The patient understands the plans discussed today and is in agreement with them.  She knows to contact our office if she develops concerns prior to her next appointment.  I provided 11 minutes of face-to-face time during this this encounter and > 50% was spent counseling as documented under my assessment and plan.    Gery Pray MD  [facility] [address] [place] [address] Dept: [phone] Dept Fax: [phone]    CHIEF COMPLAINT:  CC: Essential thrombocythemia  Current Treatment:  Hydroxyurea 500 mg 3 times daily   HISTORY OF PRESENT ILLNESS:  Allison Stevens is a 58 y.o. female with a history of essential thrombocythemia originally diagnosed in January 2005.  This has been treated with hydroxyurea, but the patient has often been non-compliant.  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  She has persistent pain in the left upper quadrant, attributed to splenomegaly, as well as bone pain, for which she remains on OxyContin with oxycodone for breakthrough.  We have tapered her opioids down over time. She saw Dr. Adriana Simas, her gastroenterologist at [facility], and underwent an esophageal dilatation in August 2016.  She had already had a colonoscopy due to previous gastrointestinal complaints.  We have been following a lesion in the pancreas seen incidentally on CT done in the emergency room for abdominal pain.  This has been felt to be a benign cyst.  Repeat MRI abdomen  in November 2016, revealed a slight increase in the benign appearing cyst of the pancreas.  No splenomegaly was seen.  Repeat MRI abdomen in 1 year was recommended.  She was hospitalized in September 2017 and underwent colonoscopy with removal of a small polyp in the sigmoid colon.  Small internal hemorrhoids were also seen.  Dr. Chales Abrahams also obtained a CEA and CA 19-9, and. the CA 19-9 was elevated at 145, the CEA was normal. Repeat MRI abdomen with contrast in September 2017 revealed a stable 14 mm cystic lesion in the pancreas.  Again, no splenomegaly was seen.  Follow-up MRI in 1 year was recommended.  Repeat MRI abdomen in March 2019 revealed a 17 mm cystic lesion in the pancreatic uncinate process, which was felt to be stable.  No significant liver lesions were seen.  Repeat MRI abdomen in 1 year was recommended.  Repeat MRI abdomen in September 2019 revealed a stable 19 mm cystic lesion of the pancreas, which appeared to be benign and still felt to be stable.  Repeat MRI abdomen with and without contrast in 1 year was  recommended.  MRI abdomen in October 2020 revealed a stable 17 mm cystic lesion of the pancreas.  Continued yearly follow up MRI abdomen was recommended.   Screening mammogram in February 2016 revealed a possible mass in the right breast.  Right diagnostic mammogram and ultrasound was done and revealed 2 well circumscribed hypoechoic lesions in the right breast at 11 o 'clock, felt to represent cysts.  Repeat mammogram and ultrasound in August revealed heterogeneous fibroglandular tissue felt to be benign, with resolution of the previously seen cysts and no masses.  Repeat diagnostic bilateral mammogram and ultrasound in 6 months was once again recommended.  Bilateral diagnostic mammogram in March 2017 did not reveal any evidence of malignancy, so 1 year follow-up screening mammogram was recommended.  Bilateral screening mammogram in March 2018 did not reveal any evidence of malignancy.  Bone  density scan in June 2018 revealed osteopenia with a T-score of -1.5 in the femur.  The patient was instructed to take calcium and vitamin-D twice daily.  The patient's family history is significant in that her father had pancreatic cancer at age 49, a paternal uncle had pancreatic cancer and a paternal aunt had pancreatic cancer.  Her brother also had pancreatic cancer at age 36.  She is appropriate for genetic testing, but this has not been done yet due to insurance coverage.  Bilateral screening mammogram in December 2020 did not reveal any evidence of malignancy.    Repeat MRI abdomen in September 2021 revealed a 1.9 cm cystic lesion in the pancreatic uncinate process, which showed no significant change compared to more recent exams, but has shown a slow increase in size since 2016. 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 Follow-up by MRI in 2 years was recommended. She reports having another stroke in May 2022.  She then had COVID later in May and was hospitalized due to this.  She had been on hydroxyurea 4 times a day, but this was reduced in September to 3 times a day as her platelets were controlled and she was more anemic with a hemoglobin of 10. Repeat CBC in November revealed her hemoglobin to be back up to 11.6.  Her last colonoscopy was in September 2017 and she did have a polypectomy of a tubular adenoma of the sigmoid colon at that time.  He also had an EGD in April of 2018, which showed mild gastritis, a small hiatal hernia, and a Schatzki ring.  She also had esophageal dilation.  INTERVAL HISTORY:  Allison Stevens is here today for repeat clinical assessment essential thrombocythemia and an enlarging pancreatic cyst. Patient states that she feels *** and ***.     She denies signs of infection such as sore throat, sinus drainage, cough, or urinary symptoms.  She denies fevers or recurrent chills. She denies pain. She denies  nausea, vomiting, chest pain, dyspnea or cough. Her appetite is *** and her weight {Weight change:10426}.  Patient states that she feels ok but complains of pain in her mid left upper abdomen. She will have an EUS by the GI doctor in [place] done on 05/11/2023. Her BP today was 183/87 and when rechecked at the end of the appointment was 158/76. She continues to remain off Hydrea. I will prescribe Oxycodone HCL 10mg . Her WBC was low at 2.6, hemoglobin low at 10.1, and platelet count normal at 224,000 as of 04/11/2023. Her potassium level was very low at 2.6 and a mildly low calcium of  8.6 as of 04/11/2023. She takes 2 potassium supplements daily but compliance is questionable and she had refused IV potassium last time.  Her labs today are pending. Her CA 19.9 has been mildly elevated in 50's but stable. She will see her PCP in 1.5 weeks. I will see her back in 1 month with CBC and CMP to review the findings of her EUS. She denies signs of infection such as sore throat, sinus drainage, cough, or urinary symptoms.  She denies fevers or recurrent chills. She denies pain. She denies nausea, vomiting, chest pain, dyspnea or cough. Her appetite is off and on and her weight has decreased 2 pounds over last month .    REVIEW OF SYSTEMS:  Review of Systems  Constitutional:  Positive for fatigue. Negative for appetite change, chills, diaphoresis, fever and unexpected weight change.  HENT:   Negative for hearing loss, lump/mass, mouth sores, nosebleeds, sore throat, tinnitus, trouble swallowing and voice change.   Eyes: Negative.  Negative for eye problems and icterus.  Respiratory: Negative.  Negative for chest tightness, cough, hemoptysis, shortness of breath and wheezing.   Cardiovascular: Negative.  Negative for chest pain, leg swelling and palpitations.  Gastrointestinal:  Positive for abdominal pain. Negative for abdominal distention, blood in stool, constipation, diarrhea, nausea, rectal pain and vomiting.   Endocrine: Negative.  Negative for hot flashes.  Genitourinary: Negative.  Negative for bladder incontinence, difficulty urinating, dyspareunia, dysuria, frequency, hematuria, menstrual problem, nocturia, pelvic pain, vaginal bleeding and vaginal discharge.   Musculoskeletal:  Positive for arthralgias (right hip since a fall) and gait problem (limping due to previous stroke, has worsened). Negative for back pain, flank pain, myalgias, neck pain and neck stiffness.       Bones hurt  Skin: Negative.  Negative for itching, rash and wound.  Neurological:  Positive for extremity weakness and gait problem (limping due to previous stroke, has worsened). Negative for dizziness, headaches, light-headedness, numbness, seizures and speech difficulty.  Hematological:  Negative for adenopathy. Bruises/bleeds easily.  Psychiatric/Behavioral:  Positive for depression. Negative for confusion, decreased concentration, sleep disturbance and suicidal ideas. The patient is not nervous/anxious.      VITALS:  There were no vitals taken for this visit.  Wt Readings from Last 3 Encounters:  07/11/23 139 lb 9.6 oz (63.3 kg)  05/11/23 134 lb (60.8 kg)  05/09/23 138 lb 8 oz (62.8 kg)    There is no height or weight on file to calculate BMI.  Performance status (ECOG): 1 - Symptomatic but completely ambulatory  PHYSICAL EXAM:  Physical Exam Vitals and nursing note reviewed.  Constitutional:      General: She is not in acute distress.    Appearance: Normal appearance. She is normal weight. She is not ill-appearing, toxic-appearing or diaphoretic.  HENT:     Head: Normocephalic and atraumatic.     Right Ear: Tympanic membrane, ear canal and external ear normal. There is no impacted cerumen.     Left Ear: Tympanic membrane, ear canal and external ear normal. There is no impacted cerumen.     Nose: Nose normal. No congestion or rhinorrhea.     Mouth/Throat:     Mouth: Mucous membranes are moist.     Pharynx:  Oropharynx is clear. No oropharyngeal exudate or posterior oropharyngeal erythema.  Eyes:     General: No scleral icterus.       Right eye: No discharge.        Left eye: No discharge.     Extraocular  Movements: Extraocular movements intact.     Conjunctiva/sclera: Conjunctivae normal.     Pupils: Pupils are equal, round, and reactive to light.  Neck:     Vascular: No carotid bruit.  Cardiovascular:     Rate and Rhythm: Normal rate and regular rhythm.     Pulses: Normal pulses.     Heart sounds: Normal heart sounds. No murmur heard.    No friction rub. No gallop.  Pulmonary:     Effort: Pulmonary effort is normal. No respiratory distress.     Breath sounds: Normal breath sounds. No stridor. No wheezing, rhonchi or rales.  Chest:     Chest wall: No tenderness.  Abdominal:     General: Bowel sounds are normal. There is no distension.     Palpations: Abdomen is soft. There is no hepatomegaly, splenomegaly or mass.     Tenderness: There is abdominal tenderness in the left upper quadrant. There is no right CVA tenderness, left CVA tenderness, guarding or rebound.     Hernia: No hernia is present.     Comments: Abdomen is mildly distended   Musculoskeletal:        General: No swelling, tenderness, deformity or signs of injury. Normal range of motion.     Cervical back: Normal range of motion and neck supple. No rigidity or tenderness.     Right lower leg: No edema.     Left lower leg: No edema.  Lymphadenopathy:     Cervical: No cervical adenopathy.     Upper Body:     Right upper body: No supraclavicular or axillary adenopathy.     Left upper body: No supraclavicular or axillary adenopathy.     Lower Body: No right inguinal adenopathy. No left inguinal adenopathy.  Skin:    General: Skin is warm and dry.     Coloration: Skin is not jaundiced or pale.     Findings: No abrasion, bruising, erythema, lesion or rash.     Comments:    Neurological:     General: No focal deficit  present.     Mental Status: She is alert and oriented to person, place, and time. Mental status is at baseline.     Cranial Nerves: No cranial nerve deficit.     Sensory: No sensory deficit.     Motor: No weakness.     Coordination: Coordination normal.     Gait: Gait abnormal.     Deep Tendon Reflexes: Reflexes normal.     Comments: Weak lower extremities  Psychiatric:        Mood and Affect: Mood normal.        Behavior: Behavior normal.        Thought Content: Thought content normal.        Judgment: Judgment normal.    LABS:      Latest Ref Rng & Units 05/09/2023    1:49 PM 04/11/2023   10:54 AM 02/14/2023   10:38 AM  CBC  WBC 4.0 - 10.5 K/uL 3.2  2.6  2.8   Hemoglobin 12.0 - 15.0 g/dL 16.1  09.6  04.5   Hematocrit 36.0 - 46.0 % 32.0  31.0  32.5   Platelets 150 - 400 K/uL 272  224  265       Latest Ref Rng & Units 07/11/2023    2:40 PM 05/09/2023    1:49 PM 04/11/2023   10:54 AM  CMP  Glucose 70 - 99 mg/dL 409  811  914   BUN  6 - 24 mg/dL 9  13  8    Creatinine 0.57 - 1.00 mg/dL 1.61  0.96  0.45   Sodium 134 - 144 mmol/L 141  134  138   Potassium 3.5 - 5.2 mmol/L 4.2  3.5  2.6   Chloride 96 - 106 mmol/L 103  100  100   CO2 20 - 29 mmol/L 23  24  27    Calcium 8.7 - 10.2 mg/dL 9.2  9.1  8.6   Total Protein 6.0 - 8.5 g/dL 6.4  7.2  7.1   Total Bilirubin 0.0 - 1.2 mg/dL 0.4  0.5  0.8   Alkaline Phos 44 - 121 IU/L 112  86  70   AST 0 - 40 IU/L 18  18  26    ALT 0 - 32 IU/L 18  23  27     Lab Results  Component Value Date   CEA1 1.9 11/16/2022   /  CEA  Date Value Ref Range Status  11/16/2022 1.9 0.0 - 4.7 ng/mL Final    Comment:    (NOTE)                             Nonsmokers          <3.9                             Smokers             <5.6 [employer] Electrochemiluminescence Immunoassay (ECLIA) Values obtained with different assay methods or kits cannot be used interchangeably.  Results cannot be interpreted as absolute evidence of the presence  or absence of malignant disease. Performed At: [facility] [address] [place], [place] [id number] Jolene Schimke MD NW:[id number]    No results found for: "PSA1" Lab Results  Component Value Date   VHQ469 42 (H) 05/09/2023   No results found for: "CAN125"  No results found for: "TOTALPROTELP", "ALBUMINELP", "A1GS", "A2GS", "BETS", "BETA2SER", "GAMS", "MSPIKE", "SPEI" Lab Results  Component Value Date   TIBC 259 08/08/2022   TIBC 301 11/09/2021   FERRITIN 134 08/08/2022   FERRITIN 59 11/09/2021   IRONPCTSAT 42 08/08/2022   IRONPCTSAT 26 11/09/2021   No results found for: "LDH"  STUDIES:     HISTORY:   Past Medical History:  Diagnosis Date   Acid reflux    Anxiety    Carpal tunnel syndrome    Cerebrovascular disease    Diabetic retinopathy (HCC)    Family history of pancreatic cancer 08/09/2021   Gait abnormality    History of stroke    Hyperlipidemia    Hypertension    Migraine    Pancreatic cyst 08/09/2021   Retinal detachment    Thrombocytosis    Trigger finger    Ulnar nerve entrapment     Past Surgical History:  Procedure Laterality Date   BIOPSY  05/11/2023   Procedure: BIOPSY;  Surgeon: Lemar Lofty., MD;  Location: [facility];  Service: Gastroenterology;;   CESAREAN SECTION  1997   ESOPHAGOGASTRODUODENOSCOPY N/A 05/11/2023   Procedure: ESOPHAGOGASTRODUODENOSCOPY (EGD);  Surgeon: Lemar Lofty., MD;  Location: [facility];  Service: Gastroenterology;  Laterality: N/A;   EUS N/A 05/11/2023   Procedure: UPPER ENDOSCOPIC ULTRASOUND (EUS) RADIAL;  Surgeon: Lemar Lofty., MD;  Location: [facility];  Service: Gastroenterology;  Laterality: N/A;   FINE NEEDLE ASPIRATION N/A 05/11/2023   Procedure: FINE  NEEDLE ASPIRATION (FNA) LINEAR;  Surgeon: Meridee Score Netty Starring., MD;  Location: [facility];  Service: Gastroenterology;  Laterality: N/A;   GALLBLADDER SURGERY  2011   Family History  Problem Relation Age  of Onset   Diabetes Mother    Cancer Mother    Pancreatic cancer Father    Pancreatic cancer Brother    Pancreatic cancer Brother    Healthy Child    Pancreatic cancer Paternal Uncle   Her brother recently had surgery for pancreatic cancer but it was unresectable and he has now been referred to hospice and her other brother had previously died of pancreatic cancer and she reminded me that she has had her father, 2 aunts, and 3 uncles that have died, presumably from cancer.    Social History:  reports that she has never smoked. She has never used smokeless tobacco. She reports that she does not drink alcohol and does not use drugs.The patient is alone today.  Allergies:  Allergies  Allergen Reactions   Iodinated Contrast Media Other (See Comments) and Shortness Of Breath   Morphine Itching and Other (See Comments)    "Makes me feel out of my mind" per pt.    Current Medications: Current Outpatient Medications  Medication Sig Dispense Refill   ALPRAZolam (XANAX) 0.5 MG tablet Take 1 tablet (0.5 mg total) by mouth every 8 (eight) hours as needed for anxiety. 90 tablet 2   amLODipine (NORVASC) 10 MG tablet TAKE 1 TABLET BY MOUTH EVERY DAY 90 tablet 0   aspirin 81 MG chewable tablet Chew 1 tablet by mouth daily.     BD PEN NEEDLE NANO 2ND GEN 32G X 4 MM MISC CHECK BLOOD SUGAR THREE TIMES A DAY 100 each 3   carvedilol (COREG) 12.5 MG tablet TAKE 1 TABLET BY MOUTH 2 TIMES DAILY. 180 tablet 0   clopidogrel (PLAVIX) 75 MG tablet Take 1 tablet (75 mg total) by mouth daily.     diclofenac Sodium (VOLTAREN) 1 % GEL Apply 4 g topically 4 (four) times daily.     enalapril (VASOTEC) 20 MG tablet Take 20 mg by mouth 2 (two) times daily.     famotidine (PEPCID) 40 MG tablet Take 40 mg by mouth at bedtime.     fluticasone (FLONASE) 50 MCG/ACT nasal spray PLACE 1 SPRAY INTO BOTH NOSTRILS IN THE MORNING AND AT BEDTIME. 48 mL 1   fluticasone (FLOVENT HFA) 44 MCG/ACT inhaler      glimepiride (AMARYL) 2  MG tablet Take by mouth.     ibuprofen (ADVIL,MOTRIN) 800 MG tablet Take 800 mg by mouth every 8 (eight) hours as needed.     Insulin Detemir (LEVEMIR) 100 UNIT/ML Pen Inject into the skin.     metFORMIN (GLUCOPHAGE) 1000 MG tablet TAKE 1 TABLET (1,000 MG TOTAL) BY MOUTH TWICE A DAY WITH FOOD 180 tablet 0   NOVOLOG FLEXPEN 100 UNIT/ML FlexPen INJECT 34 UNIT(S) INTO THE SKIN 3 TIMES DAILY BEFORE MEALS 15 mL 3   ondansetron (ZOFRAN) 4 MG tablet Take 1 tablet (4 mg total) by mouth every 8 (eight) hours as needed for nausea or vomiting. 20 tablet 0   Oxycodone HCl 10 MG TABS Take 1 tablet (10 mg total) by mouth every 4 (four) hours as needed. 120 tablet 0   pantoprazole (PROTONIX) 40 MG tablet Take 1 tablet (40 mg total) by mouth daily. 30 tablet 6   potassium chloride SA (KLOR-CON M) 20 MEQ tablet Take 20 mEq by mouth 2 (two) times daily.  prochlorperazine (COMPAZINE) 10 MG tablet Take 10 mg by mouth every 6 (six) hours as needed for nausea or vomiting.     rosuvastatin (CRESTOR) 20 MG tablet Take 1 tablet (20 mg total) by mouth daily. 30 tablet 11   TOUJEO MAX SOLOSTAR 300 UNIT/ML Solostar Pen SMARTSIG:80 Unit(s) SUB-Q Every Night 300 mL 1   No current facility-administered medications for this visit.    I,Jasmine M Lassiter,acting as a scribe for Dellia Beckwith, MD.,have documented all relevant documentation on the behalf of Dellia Beckwith, MD,as directed by  Dellia Beckwith, MD while in the presence of Dellia Beckwith, MD.

## 2023-08-29 NOTE — Telephone Encounter (Signed)
08/29/23 Spoke with patient and rescheduled appt on 09/06/23

## 2023-08-30 ENCOUNTER — Other Ambulatory Visit: Payer: Self-pay | Admitting: Internal Medicine

## 2023-09-03 ENCOUNTER — Other Ambulatory Visit: Payer: Self-pay | Admitting: Internal Medicine

## 2023-09-06 ENCOUNTER — Inpatient Hospital Stay: Payer: Medicare Other | Admitting: Hematology and Oncology

## 2023-09-06 ENCOUNTER — Encounter: Payer: Self-pay | Admitting: Hematology and Oncology

## 2023-09-06 ENCOUNTER — Inpatient Hospital Stay: Payer: Medicare Other | Attending: Hematology and Oncology

## 2023-09-06 VITALS — BP 199/95 | HR 71 | Temp 97.9°F | Resp 20 | Ht 64.0 in | Wt 140.4 lb

## 2023-09-06 DIAGNOSIS — Z800 Family history of malignant neoplasm of digestive organs: Secondary | ICD-10-CM | POA: Diagnosis not present

## 2023-09-06 DIAGNOSIS — Z1231 Encounter for screening mammogram for malignant neoplasm of breast: Secondary | ICD-10-CM | POA: Diagnosis not present

## 2023-09-06 DIAGNOSIS — K862 Cyst of pancreas: Secondary | ICD-10-CM

## 2023-09-06 DIAGNOSIS — D473 Essential (hemorrhagic) thrombocythemia: Secondary | ICD-10-CM

## 2023-09-06 DIAGNOSIS — D701 Agranulocytosis secondary to cancer chemotherapy: Secondary | ICD-10-CM | POA: Insufficient documentation

## 2023-09-06 DIAGNOSIS — D630 Anemia in neoplastic disease: Secondary | ICD-10-CM

## 2023-09-06 DIAGNOSIS — R978 Other abnormal tumor markers: Secondary | ICD-10-CM

## 2023-09-06 DIAGNOSIS — T451X5A Adverse effect of antineoplastic and immunosuppressive drugs, initial encounter: Secondary | ICD-10-CM | POA: Diagnosis not present

## 2023-09-06 DIAGNOSIS — D649 Anemia, unspecified: Secondary | ICD-10-CM | POA: Diagnosis not present

## 2023-09-06 DIAGNOSIS — Z79899 Other long term (current) drug therapy: Secondary | ICD-10-CM | POA: Diagnosis not present

## 2023-09-06 LAB — CBC WITH DIFFERENTIAL (CANCER CENTER ONLY)
Abs Immature Granulocytes: 0.01 10*3/uL (ref 0.00–0.07)
Basophils Absolute: 0 10*3/uL (ref 0.0–0.1)
Basophils Relative: 0 %
Eosinophils Absolute: 0.1 10*3/uL (ref 0.0–0.5)
Eosinophils Relative: 2 %
HCT: 31 % — ABNORMAL LOW (ref 36.0–46.0)
Hemoglobin: 10.6 g/dL — ABNORMAL LOW (ref 12.0–15.0)
Immature Granulocytes: 0 %
Lymphocytes Relative: 19 %
Lymphs Abs: 0.6 10*3/uL — ABNORMAL LOW (ref 0.7–4.0)
MCH: 31.5 pg (ref 26.0–34.0)
MCHC: 34.2 g/dL (ref 30.0–36.0)
MCV: 92.3 fL (ref 80.0–100.0)
Monocytes Absolute: 0.2 10*3/uL (ref 0.1–1.0)
Monocytes Relative: 6 %
Neutro Abs: 2.3 10*3/uL (ref 1.7–7.7)
Neutrophils Relative %: 73 %
Platelet Count: 283 10*3/uL (ref 150–400)
RBC: 3.36 MIL/uL — ABNORMAL LOW (ref 3.87–5.11)
RDW: 15 % (ref 11.5–15.5)
WBC Count: 3.1 10*3/uL — ABNORMAL LOW (ref 4.0–10.5)
nRBC: 0 % (ref 0.0–0.2)
nRBC: 0 /100{WBCs}

## 2023-09-06 LAB — CMP (CANCER CENTER ONLY)
ALT: 18 U/L (ref 0–44)
AST: 23 U/L (ref 15–41)
Albumin: 4.1 g/dL (ref 3.5–5.0)
Alkaline Phosphatase: 91 U/L (ref 38–126)
Anion gap: 9 (ref 5–15)
BUN: 8 mg/dL (ref 6–20)
CO2: 27 mmol/L (ref 22–32)
Calcium: 9.6 mg/dL (ref 8.9–10.3)
Chloride: 107 mmol/L (ref 98–111)
Creatinine: 0.8 mg/dL (ref 0.44–1.00)
GFR, Estimated: >60 mL/min (ref >60)
Glucose, Bld: 98 mg/dL (ref 70–99)
Potassium: 3.7 mmol/L (ref 3.5–5.1)
Sodium: 142 mmol/L (ref 135–145)
Total Bilirubin: 0.3 mg/dL (ref 0.0–1.2)
Total Protein: 7 g/dL (ref 6.5–8.1)

## 2023-09-06 NOTE — Progress Notes (Signed)
 [facility]  [address] [place],  [place]  [address] [phone]  Clinic Day:  09/06/2023  Referring physician: Fleeta Valeria Mayo, MD  ASSESSMENT & PLAN:   Assessment & Plan: Essential thrombocytosis (HCC) Essential thrombocythemia, previously treated with hydroxyurea 500 mg 4 times daily. She developed leukopenia and anemia, so hydroxyurea was decreased to 3 times daily.  In April 2024, hydroxyurea was decreased to twice daily due to persistent leukopenia.  In June 2024, she had worsening leukopenia with the WBC  to 2.6, as well as worsening anemia.  Her platelets were normal at 274,000, so hydroxyurea was discontinued.  Her platelets have remained normal since that time.  She has had little improvement in her leukopenia and anemia.  As her blood counts are stable, I will plan to see her back in 4 months with a CBC and comprehensive metabolic panel, as well as bilateral screening mammogram.  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  In November 2023, this increased significantly to 3.8 cm in maximum diameter and appears to communicate with the main pancreatic duct. She was gastroenterology in [place] but did not keep her appointment, she had a repeat MRI abdomen in May 2024 which revealed further enlargement of the pancreatic lesion to 4.5 cm. She has a strong family history for pancreatic cancer and another brother was referred to hospice because it was unresectable at the time of surgery.  She was re-referred to gastroenterology in [facility] for an EUS and saw Dr. Wilhelmenia. The EUS had to be rescheduled due to hypertension, but she was finally able to have EUS and biopsy of the pancreatic cyst in October. Cytology was negative.  Dr. Wilhelmenia recommended MRI/MRCP in 6 months, so she will be due for that at the end of April.  Leukopenia due to antineoplastic chemotherapy (HCC) Her white count is stable at  3.1 with an ANC 2300, which is stable.  Most likely this is from her hydroxyurea, and that has been stopped. Unfortunately, she is also at risk for development of acute leukemia or myelofibrosis due to her myeloproliferative disorder as well as the hydroxyurea.   Anemia Mild chronic anemia, which is likely secondary to hydroxyurea, which has been discontinued.  This is stable. I will plan to repeat nutritional studies at her next visit.  Family history of pancreatic cancer Two first-degree relatives, her brothers, with pancreatic cancer.  One brother declined genetic testing that we know of and he has expired. Another brother is on hospice, so cannot be tested.  Genetic testing has been recommended for the patient, but not done due to lack of insurance coverage. She reports are multiple other relatives with pancreatic cancer also.     The patient understands the plans discussed today and is in agreement with them.  She knows to contact our office if she develops concerns prior to her next appointment.   I provided 20 minutes of face-to-face time during this encounter and > 50% was spent counseling as documented under my assessment and plan.    Allison Stevens A Allison Ghuman, PA-C  [facility] - A DEPT OF [facility] [address] [place] [address] Dept: [phone] Dept Fax: [phone]   Orders Placed This Encounter  Procedures   MR ABDOMEN MRCP W WO CONTAST    Standing Status:   Future    Expected Date:   11/27/2023    Expiration Date:  09/07/2024    If indicated for the ordered procedure, I authorize the administration of contrast media per Radiology protocol:   Yes    What is the patient's sedation requirement?:   No Sedation    Does the patient have a pacemaker or implanted devices?:   No    Preferred imaging location?:   [facility]   MM DIGITAL SCREENING BILATERAL    Standing Status:   Future    Expected Date:   12/21/2023     Expiration Date:   09/07/2024    Reason for Exam (SYMPTOM  OR DIAGNOSIS REQUIRED):   screening for breast cancer    Is the patient pregnant?:   No    Preferred imaging location?:   [facility]   CBC with Differential (Cancer Center Only)    Standing Status:   Future    Expected Date:   01/04/2024    Expiration Date:   09/05/2024   CMP (Cancer Center only)    Standing Status:   Future    Expected Date:   01/04/2024    Expiration Date:   09/05/2024   Ferritin    Standing Status:   Future    Expected Date:   01/04/2024    Expiration Date:   09/05/2024   Iron and TIBC    Standing Status:   Future    Expected Date:   01/04/2024    Expiration Date:   09/05/2024   Folate    Standing Status:   Future    Expected Date:   01/04/2024    Expiration Date:   09/05/2024   Vitamin B12    Standing Status:   Future    Expected Date:   01/04/2024    Expiration Date:   09/05/2024   CBC with Differential (Cancer Center Only)    Standing Status:   Future    Expected Date:   11/27/2023    Expiration Date:   09/07/2024   CMP (Cancer Center only)    Standing Status:   Future    Expected Date:   11/27/2023    Expiration Date:   09/07/2024   Cancer antigen 19-9    Standing Status:   Future    Expected Date:   11/27/2023    Expiration Date:   09/07/2024      CHIEF COMPLAINT:  CC: Essential thrombocytosis  Current Treatment: Surveillance  HISTORY OF PRESENT ILLNESS:  Allison Stevens is a 58 y.o. female with a history of essential thrombocythemia originally diagnosed in January 2005.  She was treated with hydroxyurea, but was often non-compliant.  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  She has persistent pain in the left upper quadrant, attributed to splenomegaly, as well as bone pain, for which she had been on OxyContin  with oxycodone  for breakthrough.  She was subsequently tapered off OxyContin  and oxycodone  has been decreased.  We have been following a lesion in the pancreas seen  incidentally on CT done in the emergency room for abdominal pain.  This has been felt to be a benign cyst.  Repeat MRI abdomen in November 2016, revealed a slight increase in the benign appearing cyst of the pancreas.  No splenomegaly was seen.  Repeat MRI abdomen in 1 year was recommended.  She was hospitalized in September 2017 and underwent colonoscopy with removal of a small polyp in the sigmoid colon.  Small internal hemorrhoids were also seen.  Dr. Charlanne also obtained a CEA and CA 19-9,  and. the CA 19-9 was elevated at 145, the CEA was normal. EGD in April 2018 showed mild gastritis, a small hiatal hernia, and a Schatzki ring.  She had esophageal dilation at that time.  MRI abdomen with contrast in September 2017 revealed a stable 14 mm cystic lesion in the pancreas.  Again, no splenomegaly was seen.  Follow-up MRI in 1 year was recommended. MRI abdomen in March 2019 revealed a 17 mm cystic lesion in the pancreatic uncinate process, which was felt to be stable.  No significant liver lesions were seen. Repeat MRI abdomen in 1 year was recommended.  MRI abdomen in September 2019 revealed a stable 19 mm cystic lesion of the pancreas, which appeared to be benign and still felt to be stable.  Repeat MRI in 1 year was recommended.  MRI abdomen in October 2020 revealed a stable 17 mm cystic lesion of the pancreas. Continued yearly follow up MRI abdomen was recommended.  MRI abdomen in September 2021 revealed a 1.9 cm cystic lesion in the pancreatic uncinate process, which showed no significant change compared to more recent exams, but has shown a slow increase in size since 2016. 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 Follow-up by MRI in 2 years was recommended.    Screening mammogram in February 2016 revealed a possible mass in the right breast.  Right diagnostic mammogram and ultrasound was done and revealed 2 well circumscribed  hypoechoic lesions in the right breast at 11 o 'clock, felt to represent cysts.  Repeat mammogram and ultrasound in August revealed heterogeneous fibroglandular tissue felt to be benign, with resolution of the previously seen cysts and no masses.  Repeat diagnostic bilateral mammogram and ultrasound in 6 months was once again recommended. Bilateral diagnostic mammogram in March 2017 did not reveal any evidence of malignancy, so 1 year follow-up screening mammogram was recommended.  Bilateral screening mammogram in March 2018 did not reveal any evidence of malignancy.  Bilateral screening mammogram in December 2020 did not reveal any evidence of malignancy.  Bilateral screening mammogram in November 2022 was negative.   Bone density scan in June 2018 revealed osteopenia with a T-score of -1.5 in the femur.  The patient was instructed to take calcium  and vitamin-D twice daily.    The patient's family history is significant in that her father had pancreatic cancer at age 19, a paternal uncle had pancreatic cancer and a paternal aunt had pancreatic cancer.  Her brother also had pancreatic cancer at age 68.  She is appropriate for genetic testing, but this has not been done yet due to insurance coverage.   MRI abdomen in November 2023 revealed significant interval enlargement of a thinly septated cystic lesion of the central pancreatic head, now measuring 3.8 x 3.0 cm, previously 2.5 x 2.0 cm. This appears to communicate with the adjacent main pancreatic duct, and as previously reported, this has been observed to grow very slowly over a long period of time on examinations dating back to at least 2015. 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 Given substantial interval growth and size and greater than 2.5 cm, recommend EUS/FNA for definitive diagnosis.  She was referred to gastroenterology in [facility] and was scheduled with Dr. Wilhelmenia in January 2024, but did not keep her appointment.  MRI abdomen in May 2024  revealed continued enlargement of a lobulated, thinly septated cystic lesion in the central pancreatic head now measuring 4.5 x 3.7 cm.  EUS/FNA was  once again recommended.  She was referred back and saw Dr. Wilhelmenia.  Her EUS had to be delayed due to hypertension.  She finally had this done in October and cytology was negative.  She had colonoscopy in April 2024 with Misenheimer, but this was incomplete due to poor prep.  EGD revealed esophageal stricture which was dilated again.  Bilateral screening mammogram in May 2024 was negative.  Bilateral screening mammogram in May 2024 did not reveal any evidence of malignancy.  She had been on hydroxyurea 500 mg 4 times a day for many years.  Hydroxyurea was decreased to 3 times a day in September 2022, as her platelets were controlled and she was more anemic with a hemoglobin of 10. Repeat CBC in November 2022 revealed her hemoglobin to be back up to 11.6 and platelets remained in good range so we continued hydroxyurea 500 mg 3 times daily.  At some point, the patient started taking hydroxyurea 4 times daily again.    She developed mild leukopenia in January 2024, so so we decreased hydroxyurea back to 3 times daily.  She had worsening neutropenia and anemia in May 2024, so we decreased hydroxyurea to once daily.  She had severe hypokalemia as she stopped taking her potassium in June and September.  She was given IV supplementation she had persistent neutropenia and anemia with normal platelets in July, so hydroxyurea was discontinued.  She was last seen in October improvement in the neutropenia with stable anemia.  Platelets have remained normal, so she has remained off hydroxyurea.  INTERVAL HISTORY:  Valoria is here today for repeat clinical assessment.  She reports fatigue.  She has chronic intermittent nausea without vomiting, for which medication is effective.  She denies fevers or chills. She reports persistent left upper quadrant pain, which is fairly  well controlled with oxycodone  10 mg every 4 hours. 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  She uses her walker, but continues to have falls.  Her appetite is good. Her weight has been stable. She tells me she used alprazolam  briefly when her husband coded and was hospitalized for an extended period of time.  She states she continues her potassium supplement.  She continues to care for her husband and does his dialysis at home.  Her blood pressure is high today, but she has not taken her medication yet.  REVIEW OF SYSTEMS:  Review of Systems  Constitutional:  Positive for fatigue. Negative for appetite change, chills, diaphoresis, fever and unexpected weight change.  HENT:   Negative for lump/mass, mouth sores, sore throat and trouble swallowing.   Respiratory:  Negative for cough and shortness of breath.   Cardiovascular:  Negative for chest pain and leg swelling.  Gastrointestinal:  Positive for nausea. Negative for abdominal pain, blood in stool, constipation, diarrhea and vomiting.  Endocrine: Negative for hot flashes.  Genitourinary:  Negative for difficulty urinating, dysuria, frequency, hematuria and vaginal bleeding.   Musculoskeletal:  Positive for gait problem (since stroke, uses walker). Negative for arthralgias, back pain, myalgias and neck pain.  Skin:  Negative for itching and rash.  Neurological:  Positive for extremity weakness (since stroke) and gait problem (since stroke, uses walker). Negative for dizziness, headaches and light-headedness.  Hematological:  Negative for adenopathy. Does not bruise/bleed easily.  Psychiatric/Behavioral:  Negative for depression and sleep disturbance. The patient is not nervous/anxious.      VITALS:  Blood pressure (!) 199/95, pulse 71, temperature 97.9 F (36.6 C), temperature source Oral, resp. rate  20, height 5' 4 (1.626 m), weight 140 lb 6.4 oz (63.7 kg), SpO2 100%.  Wt Readings from Last 3 Encounters:  09/06/23 140 lb 6.4 oz  (63.7 kg)  07/11/23 139 lb 9.6 oz (63.3 kg)  05/11/23 134 lb (60.8 kg)    Body mass index is 24.1 kg/m.  Performance status (ECOG): 2 - Symptomatic, <50% confined to bed  PHYSICAL EXAM:  Physical Exam Vitals and nursing note reviewed.  Constitutional:      General: She is not in acute distress.    Appearance: She is ill-appearing (chronically ill appearing).  HENT:     Head: Normocephalic and atraumatic.     Mouth/Throat:     Mouth: Mucous membranes are moist.     Pharynx: Oropharynx is clear. No oropharyngeal exudate or posterior oropharyngeal erythema.  Eyes:     General: No scleral icterus.    Extraocular Movements: Extraocular movements intact.     Conjunctiva/sclera: Conjunctivae normal.     Pupils: Pupils are equal, round, and reactive to light.  Cardiovascular:     Rate and Rhythm: Normal rate and regular rhythm.     Heart sounds: Normal heart sounds. No murmur heard.    No friction rub. No gallop.  Pulmonary:     Effort: Pulmonary effort is normal.     Breath sounds: Normal breath sounds. No wheezing, rhonchi or rales.  Abdominal:     General: There is no distension.     Palpations: Abdomen is soft. There is no hepatomegaly, splenomegaly or mass.     Tenderness: There is no abdominal tenderness.  Musculoskeletal:        General: Normal range of motion.     Cervical back: Normal range of motion and neck supple. No tenderness.     Right lower leg: No edema.     Left lower leg: No edema.  Lymphadenopathy:     Cervical: No cervical adenopathy.     Upper Body:     Right upper body: No supraclavicular or axillary adenopathy.     Left upper body: No supraclavicular or axillary adenopathy.     Lower Body: No right inguinal adenopathy. No left inguinal adenopathy.  Skin:    General: Skin is warm and dry.     Coloration: Skin is not jaundiced.     Findings: No rash.  Neurological:     Mental Status: She is alert and oriented to person, place, and time.     Cranial  Nerves: No cranial nerve deficit.  Psychiatric:        Mood and Affect: Mood normal.        Behavior: Behavior normal.        Thought Content: Thought content normal.     LABS:      Latest Ref Rng & Units 09/06/2023    1:39 PM 05/09/2023    1:49 PM 04/11/2023   10:54 AM  CBC  WBC 4.0 - 10.5 K/uL 3.1  3.2  2.6   Hemoglobin 12.0 - 15.0 g/dL 89.3  89.5  89.8   Hematocrit 36.0 - 46.0 % 31.0  32.0  31.0   Platelets 150 - 400 K/uL 283  272  224       Latest Ref Rng & Units 09/06/2023    1:39 PM 07/11/2023    2:40 PM 05/09/2023    1:49 PM  CMP  Glucose 70 - 99 mg/dL 98  862  635   BUN 6 - 20 mg/dL 8  9  13  Creatinine 0.44 - 1.00 mg/dL 9.19  9.04  8.98   Sodium 135 - 145 mmol/L 142  141  134   Potassium 3.5 - 5.1 mmol/L 3.7  4.2  3.5   Chloride 98 - 111 mmol/L 107  103  100   CO2 22 - 32 mmol/L 27  23  24    Calcium  8.9 - 10.3 mg/dL 9.6  9.2  9.1   Total Protein 6.5 - 8.1 g/dL 7.0  6.4  7.2   Total Bilirubin 0.0 - 1.2 mg/dL 0.3  0.4  0.5   Alkaline Phos 38 - 126 U/L 91  112  86   AST 15 - 41 U/L 23  18  18    ALT 0 - 44 U/L 18  18  23       Lab Results  Component Value Date   CEA1 1.9 11/16/2022   /  CEA  Date Value Ref Range Status  11/16/2022 1.9 0.0 - 4.7 ng/mL Final    Comment:    (NOTE)                             Nonsmokers          <3.9                             Smokers             <5.6 [employer] Electrochemiluminescence Immunoassay (ECLIA) Values obtained with different assay methods or kits cannot be used interchangeably.  Results cannot be interpreted as absolute evidence of the presence or absence of malignant disease. Performed At: [facility] [address] [place], [place] [id number] Jennette Shorter MD Ey:[id number]    No results found for: PSA1 Lab Results  Component Value Date   RJW800 63 (H) 09/06/2023    Lab Results  Component Value Date   TIBC 259 08/08/2022   TIBC 301 11/09/2021   FERRITIN 134 08/08/2022   FERRITIN 59  11/09/2021   IRONPCTSAT 42 08/08/2022   IRONPCTSAT 26 11/09/2021     STUDIES:  No results found.    HISTORY:   Past Medical History:  Diagnosis Date   Acid reflux    Anxiety    Carpal tunnel syndrome    Cerebrovascular disease    Diabetic retinopathy (HCC)    Family history of pancreatic cancer 08/09/2021   Gait abnormality    History of stroke    Hyperlipidemia    Hypertension    Migraine    Pancreatic cyst 08/09/2021   Retinal detachment    Thrombocytosis    Trigger finger    Ulnar nerve entrapment     Past Surgical History:  Procedure Laterality Date   BIOPSY  05/11/2023   Procedure: BIOPSY;  Surgeon: Wilhelmenia Aloha Raddle., MD;  Location: [facility];  Service: Gastroenterology;;   CESAREAN SECTION  1997   ESOPHAGOGASTRODUODENOSCOPY N/A 05/11/2023   Procedure: ESOPHAGOGASTRODUODENOSCOPY (EGD);  Surgeon: Wilhelmenia Aloha Raddle., MD;  Location: [facility];  Service: Gastroenterology;  Laterality: N/A;   EUS N/A 05/11/2023   Procedure: UPPER ENDOSCOPIC ULTRASOUND (EUS) RADIAL;  Surgeon: Wilhelmenia Aloha Raddle., MD;  Location: [facility];  Service: Gastroenterology;  Laterality: N/A;   FINE NEEDLE ASPIRATION N/A 05/11/2023   Procedure: FINE NEEDLE ASPIRATION (FNA) LINEAR;  Surgeon: Wilhelmenia Aloha Raddle., MD;  Location: [facility];  Service: Gastroenterology;  Laterality: N/A;   GALLBLADDER SURGERY  2011  Family History  Problem Relation Age of Onset   Diabetes Mother    Cancer Mother    Pancreatic cancer Father    Pancreatic cancer Brother    Pancreatic cancer Brother    Healthy Child    Pancreatic cancer Paternal Uncle     Social History:  reports that she has never smoked. She has never used smokeless tobacco. She reports that she does not drink alcohol  and does not use drugs.The patient is alone today.  Allergies:  Allergies  Allergen Reactions   Iodinated Contrast Media Other (See Comments) and Shortness Of Breath   Morphine  Itching and  Other (See Comments)    Makes me feel out of my mind per pt.    Current Medications: Current Outpatient Medications  Medication Sig Dispense Refill   ALPRAZolam  (XANAX ) 0.5 MG tablet Take 1 tablet (0.5 mg total) by mouth every 8 (eight) hours as needed for anxiety. 90 tablet 2   amLODipine  (NORVASC ) 10 MG tablet TAKE 1 TABLET BY MOUTH EVERY DAY 90 tablet 0   aspirin 81 MG chewable tablet Chew 1 tablet by mouth daily.     BD PEN NEEDLE NANO 2ND GEN 32G X 4 MM MISC CHECK BLOOD SUGAR THREE TIMES A DAY 100 each 3   carvedilol  (COREG ) 12.5 MG tablet TAKE 1 TABLET BY MOUTH 2 TIMES DAILY. 180 tablet 0   clopidogrel  (PLAVIX ) 75 MG tablet Take 1 tablet (75 mg total) by mouth daily.     diclofenac Sodium (VOLTAREN) 1 % GEL Apply 4 g topically 4 (four) times daily.     enalapril  (VASOTEC ) 20 MG tablet Take 1 tablet (20 mg total) by mouth 2 (two) times daily. 60 tablet 1   famotidine (PEPCID) 40 MG tablet Take 40 mg by mouth at bedtime.     fluticasone  (FLONASE ) 50 MCG/ACT nasal spray PLACE 1 SPRAY INTO BOTH NOSTRILS IN THE MORNING AND AT BEDTIME. 48 mL 1   fluticasone  (FLOVENT  HFA) 44 MCG/ACT inhaler      glimepiride (AMARYL) 2 MG tablet Take by mouth.     ibuprofen (ADVIL,MOTRIN) 800 MG tablet Take 800 mg by mouth every 8 (eight) hours as needed.     Insulin  Detemir (LEVEMIR) 100 UNIT/ML Pen Inject into the skin.     insulin  lispro (HUMALOG ) 100 UNIT/ML injection Inject 0.34 mLs (34 Units total) into the skin 3 (three) times daily before meals. 13 mL 3   metFORMIN  (GLUCOPHAGE ) 1000 MG tablet TAKE 1 TABLET (1,000 MG TOTAL) BY MOUTH TWICE A DAY WITH FOOD 180 tablet 0   ondansetron  (ZOFRAN ) 4 MG tablet Take 1 tablet (4 mg total) by mouth every 8 (eight) hours as needed for nausea or vomiting. 20 tablet 0   Oxycodone  HCl 10 MG TABS Take 1 tablet (10 mg total) by mouth every 4 (four) hours as needed. 120 tablet 0   pantoprazole  (PROTONIX ) 40 MG tablet Take 1 tablet (40 mg total) by mouth daily. 30 tablet  6   potassium chloride  SA (KLOR-CON  M) 20 MEQ tablet Take 20 mEq by mouth 3 (three) times daily.     prochlorperazine (COMPAZINE) 10 MG tablet Take 10 mg by mouth every 6 (six) hours as needed for nausea or vomiting.     rosuvastatin  (CRESTOR ) 20 MG tablet Take 1 tablet (20 mg total) by mouth daily. 30 tablet 11   TOUJEO  MAX SOLOSTAR 300 UNIT/ML Solostar Pen SMARTSIG:80 Unit(s) SUB-Q Every Night 300 mL 1   No current facility-administered medications for this visit.

## 2023-09-06 NOTE — Assessment & Plan Note (Addendum)
 Essential thrombocythemia, previously treated with hydroxyurea 500 mg 4 times daily. She developed leukopenia and anemia, so hydroxyurea was decreased to 3 times daily.  In April 2024, hydroxyurea was decreased to twice daily due to persistent leukopenia.  In June 2024, she had worsening leukopenia with the WBC  to 2.6, as well as worsening anemia.  Her platelets were normal at 274,000, so hydroxyurea was discontinued.  Her platelets have remained normal since that time.  She has had little improvement in her leukopenia and anemia.  As her blood counts are stable, I will plan to see her back in 4 months with a CBC and comprehensive metabolic panel, as well as bilateral screening mammogram.

## 2023-09-07 NOTE — Assessment & Plan Note (Addendum)
 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  In November 2023, this increased significantly to 3.8 cm in maximum diameter and appears to communicate with the main pancreatic duct. She was gastroenterology in [place] but did not keep her appointment, she had a repeat MRI abdomen in May 2024 which revealed further enlargement of the pancreatic lesion to 4.5 cm. She has a strong family history for pancreatic cancer and another brother was referred to hospice because it was unresectable at the time of surgery.  She was re-referred to gastroenterology in [facility] for an EUS and saw Dr. Wilhelmenia. The EUS had to be rescheduled due to hypertension, but she was finally able to have EUS and biopsy of the pancreatic cyst in October. Cytology was negative.  Dr. Wilhelmenia recommended MRI/MRCP in 6 months, so she will be due for that at the end of April.

## 2023-09-08 ENCOUNTER — Encounter: Payer: Self-pay | Admitting: Oncology

## 2023-09-08 ENCOUNTER — Other Ambulatory Visit: Payer: Self-pay

## 2023-09-08 DIAGNOSIS — I10 Essential (primary) hypertension: Secondary | ICD-10-CM

## 2023-09-08 DIAGNOSIS — E11319 Type 2 diabetes mellitus with unspecified diabetic retinopathy without macular edema: Secondary | ICD-10-CM

## 2023-09-08 LAB — CANCER ANTIGEN 19-9: CA 19-9: 63 U/mL — ABNORMAL HIGH (ref 0–35)

## 2023-09-08 MED ORDER — ENALAPRIL MALEATE 20 MG PO TABS: 20.0000 mg | ORAL_TABLET | Freq: Two times a day (BID) | ORAL | 1 refills | Status: DC

## 2023-09-08 MED ORDER — INSULIN LISPRO 100 UNIT/ML IJ SOLN: 34.0000 [IU] | Freq: Three times a day (TID) | INTRAMUSCULAR | 3 refills | Status: DC

## 2023-09-08 NOTE — Progress Notes (Signed)
 Rx refill

## 2023-09-08 NOTE — Progress Notes (Signed)
Rx Change. 

## 2023-09-08 NOTE — Assessment & Plan Note (Signed)
 Her white count is stable at 3.1 with an ANC 2300, which is stable.  Most likely this is from her hydroxyurea, and that has been stopped. Unfortunately, she is also at risk for development of acute leukemia or myelofibrosis due to her myeloproliferative disorder as well as the hydroxyurea.

## 2023-09-08 NOTE — Assessment & Plan Note (Signed)
 Mild chronic anemia, which is likely secondary to hydroxyurea, which has been discontinued.  This is stable. I will plan to repeat nutritional studies at her next visit.

## 2023-09-08 NOTE — Assessment & Plan Note (Signed)
 Two first-degree relatives, her brothers, with pancreatic cancer.  One brother declined genetic testing that we know of and he has expired. Another brother is on hospice, so cannot be tested.  Genetic testing has been recommended for the patient, but not done due to lack of insurance coverage. She reports are multiple other relatives with pancreatic cancer also.

## 2023-09-13 DIAGNOSIS — E785 Hyperlipidemia, unspecified: Secondary | ICD-10-CM | POA: Diagnosis not present

## 2023-09-13 DIAGNOSIS — Z8673 Personal history of transient ischemic attack (TIA), and cerebral infarction without residual deficits: Secondary | ICD-10-CM | POA: Diagnosis not present

## 2023-09-13 DIAGNOSIS — I679 Cerebrovascular disease, unspecified: Secondary | ICD-10-CM | POA: Diagnosis not present

## 2023-09-13 DIAGNOSIS — R2689 Other abnormalities of gait and mobility: Secondary | ICD-10-CM | POA: Diagnosis not present

## 2023-09-13 DIAGNOSIS — R296 Repeated falls: Secondary | ICD-10-CM | POA: Diagnosis not present

## 2023-09-13 DIAGNOSIS — R4189 Other symptoms and signs involving cognitive functions and awareness: Secondary | ICD-10-CM | POA: Diagnosis not present

## 2023-09-13 DIAGNOSIS — I10 Essential (primary) hypertension: Secondary | ICD-10-CM | POA: Diagnosis not present

## 2023-09-18 ENCOUNTER — Telehealth: Payer: Self-pay

## 2023-09-18 DIAGNOSIS — M898X9 Other specified disorders of bone, unspecified site: Secondary | ICD-10-CM

## 2023-09-18 DIAGNOSIS — R161 Splenomegaly, not elsewhere classified: Secondary | ICD-10-CM

## 2023-09-18 DIAGNOSIS — G894 Chronic pain syndrome: Secondary | ICD-10-CM

## 2023-09-18 NOTE — Telephone Encounter (Signed)
Last refill was 08/22/23, so not quite time fo refill.

## 2023-09-19 MED ORDER — OXYCODONE HCL 10 MG PO TABS
10.0000 mg | ORAL_TABLET | ORAL | 0 refills | Status: DC | PRN
Start: 2023-09-19 — End: 2023-10-16

## 2023-09-20 ENCOUNTER — Encounter: Payer: Self-pay | Admitting: Oncology

## 2023-09-24 ENCOUNTER — Other Ambulatory Visit: Payer: Self-pay | Admitting: Oncology

## 2023-09-24 ENCOUNTER — Other Ambulatory Visit: Payer: Self-pay | Admitting: Internal Medicine

## 2023-09-24 DIAGNOSIS — E876 Hypokalemia: Secondary | ICD-10-CM

## 2023-09-27 ENCOUNTER — Other Ambulatory Visit: Payer: Self-pay | Admitting: Internal Medicine

## 2023-09-27 MED ORDER — ALPRAZOLAM 0.5 MG PO TABS: 0.5000 mg | ORAL_TABLET | Freq: Three times a day (TID) | ORAL | 2 refills | Status: DC | PRN

## 2023-10-06 ENCOUNTER — Encounter: Payer: Medicare Other | Admitting: Internal Medicine

## 2023-10-11 ENCOUNTER — Encounter: Payer: Self-pay | Admitting: Internal Medicine

## 2023-10-11 ENCOUNTER — Ambulatory Visit: Admitting: Internal Medicine

## 2023-10-11 VITALS — BP 140/72 | HR 72 | Temp 98.7°F | Resp 18 | Ht 64.0 in | Wt 137.4 lb

## 2023-10-11 DIAGNOSIS — I6930 Unspecified sequelae of cerebral infarction: Secondary | ICD-10-CM | POA: Insufficient documentation

## 2023-10-11 DIAGNOSIS — E11311 Type 2 diabetes mellitus with unspecified diabetic retinopathy with macular edema: Secondary | ICD-10-CM

## 2023-10-11 MED ORDER — NOVOLOG FLEXPEN 100 UNIT/ML ~~LOC~~ SOPN: 35.0000 [IU] | PEN_INJECTOR | Freq: Three times a day (TID) | SUBCUTANEOUS | 11 refills | Status: DC

## 2023-10-11 MED ORDER — RABEPRAZOLE SODIUM 20 MG PO TBEC: 20.0000 mg | DELAYED_RELEASE_TABLET | Freq: Every day | ORAL | 2 refills | Status: DC

## 2023-10-11 MED ORDER — TOUJEO MAX SOLOSTAR 300 UNIT/ML ~~LOC~~ SOPN: 70.0000 [IU] | PEN_INJECTOR | Freq: Every day | SUBCUTANEOUS | 2 refills | Status: DC

## 2023-10-11 NOTE — Progress Notes (Unsigned)
 Office Visit  Subjective   Patient ID: Allison Stevens   DOB: 03/14/1966   Age: 58 y.o.   MRN: [id number]   Chief Complaint Chief Complaint  Patient presents with   Annual Exam    AWV     History of Present Illness Allison Stevens is a 58 year old African American/Black female who presents for her annual health maintenance exam. This patient's past medical history Anxiety, Cerebrovascular Accident (CVA), Diabetes Mellitus, Type II, Diabetic Retinopathy, Essential thrombocythemia, Hyperlipidemia, and Hypertension, Benign Essential.    She had a dilated eye exam on 02/03/2022 which showed mild diabetic retinopathy and mild macular edema.  She has a history of retinal detachment in the past.  Her last colonoscopy done on 11/29/2022 and this was an incomplete colonoscopy due to poor prep.  They also did an EGD on 11/29/2022 and this showed a 1.1 cm distal esophageal stricture that was dilated.  She was also noted to have a hiatal hernia.  Her previous colonoscopy was in 04/2016 which showed one polyp and she underwent polypectomy.  She also had an EGD in 10/2016, which showed mild gastritis, a small hiatal hernia, and a Schatzki ring.  She also had esophageal dilation.  She has a history of GERD and was on protonix buy she states this does not work so she stopped it.  She is having reflux every night.  Her last digital screening mammogram was done on 12/20/2022 and this was normal.  She had a normal PAP smear in 2018.  The patient does not exercise regularly.  The patient does not smoke.  She denies any problems with urination today.  She does not get yearly flu vaccines.  She has not had any of the pneumonia, shingles or COVID-19 vaccines.  She denies any depression today.   The patient is on plavix 75mg  daily.    The patient is a 58 year old African American/Black female who returns for a follow-up visit for her T2 diabetes.  This past year, her A1c was elevated and I tried to refer her to  endocrinology.  I am told by my referral clerk that none of the endocrinologists in the area will take her due to her having appointments in the past and she would no show (noncompliance).  She was on Trulicity 4.5mg  subcut once a week but stopped it in January as she was getting nausea and vomiting from this medication.   She remains Toujeo 70 Units daily (she was on 80 Units before),  Novolog 35 Units subcut usually takes 3 times a day with meals and metformin 1000mg  BID.  She tells me that she was getting hypoglycemia on her toujeo so she dropped the units from 80 to 70 Units daily.  She is not walking as much as they would like.  She specifically denies shortness of breath, unexplained fatigue, palpitations or racing heartbeats, syncopal episodes, and unexplained abdominal pain, nausea or vomiting.  She denies any hypoglycemia at this time.  She is now checking her blood sugars every day and they range 90-160.  She came in fasting today in anticipation of lab work. Her last HgbA1c was done 3 months ago and was 9.6%.  She states at that time she was taking her medications but was drinking a lot of "punch".  She has complications with diabetes with a history of diabetic retinopathy.  There are no complications of diabetic neuropathy, nephropathy or vascular disease.  Her last dilated eye exam was done  on 02/03/2022 that showed mild diabetic retinopathy with mild paramacular edema in her eyes.  She has an appointment with her eye doctor in 01/2024.    The patient is a 58 year old African American/Black female who presents for a follow-up evaluation of hypertension.  Since her last visit, she denies any problems.  Since her last visit, she has not had any problems.  The patient has been checking her blood pressure at home. The patient's blood pressure has ranged systollically from 120's.  The patient's current medications include: amlodpine 10mg  daily, enalapril maleate 20 mg BID, and hydrochlorothiazide 12.5 mg  daily.   The patient has been tolerating her medications well. The patient denies any visual changes, lightheadness, shortness of breath, orthopnea, and weakness/numbness. She reports there have been no other symptoms noted.    Allison Stevens returns today for routine followup on her cholesterol. On her last visit, her cholesterol was not controlled with her history of stroke.  We changed her atorvastatin to crestor.  Overall, she states she is doing well and is without any complaints or problems at this time. She specifically denies chest pain, abdominal pain, nausea, diarrhea, and myalgias. She remains on dietary management as well as the following cholesterol lowering medications crestor 20 mg oral tablet qhs. She is fasting in anticipation for labs today.    The patient also had a recurrent stroke when she came to see me in 11/2022.  She saw me on 12/08/2022 where she came in for an acute visit with trouble with walking which started 2-3 weeks prior.  She has had had multiple strokes related to her thrombocytosis where she has residual sequalae of left sided weakness but was able to use that side.  She has not been following with Neurology.She stated that she began having worsening weakness of her left side of both her arm and her leg that started 2-3 weeks prior.  There was no weakness of her right side.  She actually had about 4 falls with abrasions to her knees. The patient denied any dizziness but did complain of worsening left sided weakness where she had to hold on to things to walk and she began using a rolling walker.  She did follow with Hematology with Dr. Gilman Buttner where she last saw her in 10/2022 where her thrombocytosis was controlled and she was noted to have a platelet count on 11/21/2022 of 272.  She is currently treated with hydroxyruea.  The patient was diagnosed with thrombocytosis in 2005.  This new weakness was not effecting her face, speech, vision or memory.  I did a CT scan of her head  on 12/08/2022 and there was no acute intracranial abnormality.  There was chronic microvascular disease and brain atrophy and remote lacunar infarcts within her bilateral basal ganglia.  We therefore did a MRI of brain which was scheduled on 12/20/2022 and this showed a punctate acute to subacute lacunar infarct in the posterior left lentiform.  There was no associated hemorrhage or mass effect.  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  I did refer her to see neurology but somehow this was not done over the interim.  We did get therapies involved per the patient.  She was having problems with balance and continues to have falls at times.  I did increase her lipitor from 40mg  to 80mg  at bedtime and she was continued on ASA 325mg  daily  and plavix 75mg  daily.  The patient was supposed to come in for followup but we have had problems with compliance and she had missed multiple appointments.  I did refer her to neurology where she saw them last year and her last visit was on 09/13/2023.  She has multiple falls, not associated with dizziness. She loses balance. Has walker at home. She did completed therapy after first stroke but not since.   She fell in the street in 08/2023.  Again, she saw neurology last month for evaluation of chronic strokes (R caudate, R pontine) from which she has residual dysarthria and L hemiparesis. Etiology likely SVD vs ET.   She was having falls recently and since has not been in therapy where they wanted to refer her to PT as well as OT for some mild cognitive impairment.  They wanted her to continue on plavix 75mg  daily and get her cholesterol and diabetes and BP controlled.  They want to see her back in clinic in 6 months.  She tells me today that PT and OT and ST was not consulted and she needs a referral today.  She does follow with Hematology with Dr. Gilman Buttner for essential thrombocytosis with  her last visit in 09/06/2023.  She is currently treated with hydroxyruea.  The patient was diagnosed with thrombocytosis in 2005.  She has been noted to have persistent LUQ pain r/to to splenomegaly and she is being treated with Oxycodone and Oxycontin.  Pertinent workup for her splenomegaly included colonoscopy which showed some polyps and she had a polypectomy. MRI done in in 06/2015 indicated a benign appearing cyst of the pancreas. MRI in 06/2016  showed a stable cystic lesion of the pancreas measuring 14mm with no splenomegaly. MRI of abd in 09/2017 revealed a 17mm cyst in the pancreatic uncinate process with no liver lesions noted. MRI in 04/2018 indicated a 19mm stable cystic lesion of the pancreas.  She had a MRI in 04/2020 and this unfortunately showed an increase in the pancreatic cyst from 2.5 cm to 3.8 cm in the last 2 years.  Dr. Gilman Buttner wanted this further evaluated with an EUS.  There is a family history of pancreatic cancer with a first-degree relative with her brother who had pancreatic cancer.  He declined genetic testing.  Genetic testing has been recommended for the patient, but not done due to lack of insurance coverage.  The patient did see Dr. Jennye Boroughs on 11/29/2022 for a colon/EGD. She never went for the appointment that we referred for a EUS at that time and now it has been many months since her MRI scan of the abdomen, which revealed enlargement of the pancreatic lesion.  She was re-referred to gastroenterology in [facility] for an EUS and saw Dr. Meridee Score. The EUS had to be rescheduled due to hypertension, but she was finally able to have EUS and biopsy of the pancreatic cyst in 05/2023. Cytology was negative.  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  Even though there had been some suggestion of a potential pancreatic duct connection, the normal CEA and glucose levels make mucinous cystic neoplasms and IPMN seem  less likely.  Dr. Meridee Score recommended MRI/MRCP in 6 months, so she will be due for that at the end of April.  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  She was seen by hematology in 09/2023 where her  white count is stable at 3.1 with an ANC 2300, which is stable.  Most likely this is from her hydroxyurea, and that has been stopped. Unfortunately, she is also at risk for development of acute leukemia or myelofibrosis due to her myeloproliferative disorder as well as the hydroxyurea.   She was also noted to have some mild chronic anemia, which is likely secondary to hydroxyurea, which has been discontinued.      The patient is a 58 year old African American/Black female who returns for followup of her anxiety disorder.  The symptoms have been present for years and are described as mild in severity.  She states she has panic attacks maybe once per week.  She denies any depression.   She reports no additional symptoms She denies feelings of worthlessness, helpless feeling, suicidal ideation, homicidal ideation, weight gain, weight loss, and hypersomnia or insomnia. This patient feels that she is able to care for herself and her dependents. Predisposing factors include: a chronic medical illness and life style stress of multiple roles. She denies a recent life crisis. She currently lives with her family.        The patient also has a history of trigger finger of multiple fingers of her bilateral hands.  She is followed by Orthopedics and she states that they have discussed surgery for treatment. She uses brace and that has not been effective.  She has had multiple injections of her hands.             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     Medications  Current Outpatient Medications:    ALPRAZolam (XANAX) 0.5 MG tablet, Take 1 tablet (0.5 mg total) by mouth every 8 (eight) hours as needed for anxiety., Disp: 90 tablet, Rfl: 2   amLODipine (NORVASC) 10 MG tablet, TAKE 1 TABLET BY MOUTH EVERY DAY, Disp: 90 tablet, Rfl: 0   aspirin 81 MG chewable tablet, Chew 1 tablet by mouth daily., Disp: , Rfl:    BD PEN NEEDLE NANO 2ND GEN 32G X 4 MM MISC, CHECK BLOOD SUGAR THREE TIMES A DAY, Disp: 100 each, Rfl: 3   carvedilol (COREG) 12.5 MG tablet, TAKE 1 TABLET BY MOUTH 2 TIMES DAILY., Disp: 180 tablet, Rfl: 0   clopidogrel (PLAVIX) 75 MG tablet, Take 1 tablet (75 mg total) by mouth daily., Disp: , Rfl:    diclofenac Sodium (VOLTAREN) 1 % GEL, Apply 4 g topically 4 (four) times daily., Disp: , Rfl:    enalapril (VASOTEC) 20 MG tablet, Take 1 tablet (20 mg total) by mouth 2 (two) times daily., Disp: 60 tablet, Rfl: 1   famotidine (PEPCID) 40 MG tablet, Take 40 mg by mouth at bedtime., Disp: , Rfl:    fluticasone (FLONASE) 50 MCG/ACT  nasal spray, PLACE 1 SPRAY INTO BOTH NOSTRILS IN THE MORNING AND AT BEDTIME., Disp: 48 mL, Rfl: 1   fluticasone (FLOVENT HFA) 44 MCG/ACT inhaler, , Disp: , Rfl:    ibuprofen (ADVIL,MOTRIN) 800 MG tablet, Take 800 mg by mouth every 8 (eight) hours as needed., Disp: , Rfl:    Insulin Detemir (LEVEMIR) 100 UNIT/ML Pen, Inject into the skin., Disp: , Rfl:    insulin lispro (HUMALOG) 100 UNIT/ML injection, Inject 0.34 mLs (34 Units total) into the skin 3 (three) times daily before meals., Disp: 13 mL, Rfl: 3    metFORMIN (GLUCOPHAGE) 1000 MG tablet, TAKE 1 TABLET (1,000 MG TOTAL) BY MOUTH TWICE A DAY WITH FOOD, Disp: 180 tablet, Rfl: 0   ondansetron (ZOFRAN) 4 MG tablet, Take 1 tablet (4 mg total) by mouth every 8 (eight) hours as needed for nausea or vomiting., Disp: 20 tablet, Rfl: 0   Oxycodone HCl 10 MG TABS, Take 1 tablet (10 mg total) by mouth every 4 (four) hours as needed., Disp: 120 tablet, Rfl: 0   Potassium Chloride ER 20 MEQ TBCR, TAKE 1 TABLET (20 MEQ TOTAL) BY MOUTH IN THE MORNING, AT NOON, AND AT BEDTIME., Disp: 270 tablet, Rfl: 2   potassium chloride SA (KLOR-CON M) 20 MEQ tablet, Take 20 mEq by mouth 3 (three) times daily., Disp: , Rfl:    prochlorperazine (COMPAZINE) 10 MG tablet, Take 10 mg by mouth every 6 (six) hours as needed for nausea or vomiting., Disp: , Rfl:    rosuvastatin (CRESTOR) 20 MG tablet, Take 1 tablet (20 mg total) by mouth daily., Disp: 30 tablet, Rfl: 11   TOUJEO MAX SOLOSTAR 300 UNIT/ML Solostar Pen, SMARTSIG:80 Unit(s) SUB-Q Every Night, Disp: 300 mL, Rfl: 1   Review of Systems Review of Systems  Constitutional:  Negative for chills, fever and malaise/fatigue.  Eyes:  Negative for blurred vision and double vision.  Respiratory:  Negative for cough, hemoptysis, shortness of breath and wheezing.   Cardiovascular:  Negative for chest pain, palpitations and leg swelling.  Gastrointestinal:  Positive for heartburn. Negative for abdominal pain, blood in stool, constipation, diarrhea, melena, nausea and vomiting.  Genitourinary:  Negative for frequency and hematuria.  Musculoskeletal:  Negative for myalgias.  Skin:  Negative for itching and rash.  Neurological:  Negative for dizziness, weakness and headaches.  Endo/Heme/Allergies:  Negative for polydipsia.  Psychiatric/Behavioral:  Negative for depression.        Objective:    Vitals BP (!) 140/72   Pulse 72   Temp 98.7 F (37.1 C)   Resp 18   Ht 5\' 4"  (1.626 m)   Wt 137 lb 6.4 oz (62.3 kg)   SpO2 98%    BMI 23.58 kg/m    Physical Examination Physical Exam Constitutional:      Appearance: Normal appearance. She is not ill-appearing.  HENT:     Head: Normocephalic and atraumatic.     Right Ear: Tympanic membrane, ear canal and external ear normal.     Left Ear: Tympanic membrane, ear canal and external ear normal.     Nose: Nose normal. No congestion or rhinorrhea.     Mouth/Throat:     Mouth: Mucous membranes are moist.     Pharynx: Oropharynx is clear. No oropharyngeal exudate or posterior oropharyngeal erythema.  Eyes:     General: No scleral icterus.    Conjunctiva/sclera: Conjunctivae normal.     Pupils: Pupils are equal, round, and reactive to light.  Neck:  Vascular: No carotid bruit.  Cardiovascular:     Rate and Rhythm: Normal rate and regular rhythm.     Pulses: Normal pulses.     Heart sounds: No murmur heard.    No friction rub. No gallop.  Pulmonary:     Effort: Pulmonary effort is normal. No respiratory distress.     Breath sounds: No wheezing, rhonchi or rales.  Abdominal:     General: Bowel sounds are normal. There is no distension.     Palpations: Abdomen is soft.     Tenderness: There is no abdominal tenderness.  Musculoskeletal:     Cervical back: Neck supple. No tenderness.     Right lower leg: No edema.     Left lower leg: No edema.  Lymphadenopathy:     Cervical: No cervical adenopathy.  Skin:    General: Skin is warm and dry.     Findings: No rash.  Neurological:     General: No focal deficit present.     Mental Status: She is alert and oriented to person, place, and time.  Psychiatric:        Mood and Affect: Mood normal.        Behavior: Behavior normal.        Assessment & Plan:   No problem-specific Assessment & Plan notes found for this encounter.    Return in about 3 months (around 01/11/2024).   Crist Fat, MD

## 2023-10-12 ENCOUNTER — Other Ambulatory Visit: Payer: Self-pay | Admitting: Internal Medicine

## 2023-10-12 DIAGNOSIS — I10 Essential (primary) hypertension: Secondary | ICD-10-CM

## 2023-10-12 LAB — MICROALBUMIN / CREATININE URINE RATIO
Creatinine, Urine: 106.2 mg/dL
Microalb/Creat Ratio: 27 mg/g{creat} (ref 0–29)
Microalbumin, Urine: 28.2 ug/mL

## 2023-10-13 ENCOUNTER — Ambulatory Visit

## 2023-10-16 ENCOUNTER — Other Ambulatory Visit: Payer: Self-pay

## 2023-10-16 DIAGNOSIS — R161 Splenomegaly, not elsewhere classified: Secondary | ICD-10-CM

## 2023-10-16 DIAGNOSIS — M898X9 Other specified disorders of bone, unspecified site: Secondary | ICD-10-CM

## 2023-10-16 DIAGNOSIS — G894 Chronic pain syndrome: Secondary | ICD-10-CM

## 2023-10-16 MED ORDER — OXYCODONE HCL 10 MG PO TABS
10.0000 mg | ORAL_TABLET | ORAL | 0 refills | Status: DC | PRN
Start: 2023-10-16 — End: 2023-11-14

## 2023-11-07 ENCOUNTER — Other Ambulatory Visit: Payer: Self-pay | Admitting: Oncology

## 2023-11-10 ENCOUNTER — Other Ambulatory Visit: Payer: Self-pay | Admitting: Oncology

## 2023-11-10 DIAGNOSIS — G894 Chronic pain syndrome: Secondary | ICD-10-CM

## 2023-11-10 MED ORDER — NALOXONE HCL 4 MG/0.1ML NA LIQD: NASAL | 1 refills | Status: AC

## 2023-11-14 ENCOUNTER — Other Ambulatory Visit: Payer: Self-pay

## 2023-11-14 DIAGNOSIS — G894 Chronic pain syndrome: Secondary | ICD-10-CM

## 2023-11-14 DIAGNOSIS — M898X9 Other specified disorders of bone, unspecified site: Secondary | ICD-10-CM

## 2023-11-14 DIAGNOSIS — R161 Splenomegaly, not elsewhere classified: Secondary | ICD-10-CM

## 2023-11-14 MED ORDER — OXYCODONE HCL 10 MG PO TABS: 10.0000 mg | ORAL_TABLET | ORAL | 0 refills | Status: DC | PRN

## 2023-12-01 ENCOUNTER — Other Ambulatory Visit: Payer: Self-pay | Admitting: Internal Medicine

## 2023-12-07 ENCOUNTER — Inpatient Hospital Stay (HOSPITAL_BASED_OUTPATIENT_CLINIC_OR_DEPARTMENT_OTHER): Admission: RE | Admit: 2023-12-07 | Source: Ambulatory Visit | Admitting: Radiology

## 2023-12-08 ENCOUNTER — Other Ambulatory Visit: Payer: Self-pay | Admitting: Internal Medicine

## 2023-12-08 MED ORDER — ALPRAZOLAM 0.5 MG PO TABS: 0.5000 mg | ORAL_TABLET | Freq: Three times a day (TID) | ORAL | 2 refills | Status: DC | PRN

## 2023-12-11 ENCOUNTER — Other Ambulatory Visit: Payer: Self-pay

## 2023-12-11 DIAGNOSIS — M898X9 Other specified disorders of bone, unspecified site: Secondary | ICD-10-CM

## 2023-12-11 DIAGNOSIS — G894 Chronic pain syndrome: Secondary | ICD-10-CM

## 2023-12-11 DIAGNOSIS — R161 Splenomegaly, not elsewhere classified: Secondary | ICD-10-CM

## 2023-12-11 MED ORDER — OXYCODONE HCL 10 MG PO TABS
10.0000 mg | ORAL_TABLET | ORAL | 0 refills | Status: DC | PRN
Start: 2023-12-11 — End: 2024-01-08

## 2023-12-27 ENCOUNTER — Inpatient Hospital Stay (HOSPITAL_BASED_OUTPATIENT_CLINIC_OR_DEPARTMENT_OTHER): Admission: RE | Admit: 2023-12-27 | Source: Ambulatory Visit | Admitting: Radiology

## 2024-01-02 NOTE — Progress Notes (Addendum)
 ADDENDUM:  Her bilateral screening mammogram is clear.  The MRI of the pancreas reveals a stable thin walled cyst in the head of the pancreas which remains unchanged and consistent with a large IPMN or pseudocyst.  Her CA 19-9 has decreased from 63 to 51. She will be notified.     [facility]  [address],  [place]  [address] [phone]  Clinic Day:  01/04/24   Referring physician: Fleeta Valeria Mayo, MD  ASSESSMENT & PLAN:  Assessment: Essential thrombocytosis (HCC) Essential thrombocythemia, previously treated with hydroxyurea 500 mg 4 times daily. She developed leukopenia and anemia, so hydroxyurea was decreased to 3 times daily.  In April 2024, hydroxyurea was decreased to twice daily due to persistent leukopenia.  In June 2024, she had worsening leukopenia with the WBC  to 2.6, as well as worsening anemia.  Her platelets were normal at 274,000, so hydroxyurea was discontinued.  Her platelets have remained normal since that time.  She has had a little improvement in her leukopenia and anemia.    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  In November 2023, this increased significantly to 3.8 cm in maximum diameter and appears to communicate with the main pancreatic duct. She was gastroenterology in [place] but did not keep her appointment, she had a repeat MRI abdomen in May 2024 which revealed further enlargement of the pancreatic lesion to 4.5 cm. She has a strong family history for pancreatic cancer and another brother was referred to hospice because it was unresectable at the time of surgery.  She was re-referred to gastroenterology in [facility] for an EUS and saw Dr. Wilhelmenia. The EUS had to be rescheduled due to hypertension, but she was finally able to have EUS and biopsy of the pancreatic cyst in October. Cytology was negative.  She had a repeat MRI of the pancreas today and results are pending.    Leukopenia due to antineoplastic chemotherapy (HCC) Her white count is stable at 3.1 with an ANC 2300, which is stable.  Most likely this is from her hydroxyurea, and that has been stopped. Unfortunately, she is also at risk for development of acute leukemia or myelofibrosis due to her myeloproliferative disorder as well as the hydroxyurea.    Anemia Mild chronic anemia, which is likely secondary to hydroxyurea, which has been discontinued.  This is stable. I will plan to repeat nutritional studies.   Family history of pancreatic cancer Two first-degree relatives, her brothers, with pancreatic cancer.  One brother declined genetic testing that we know of and he has expired. Another brother is on hospice, so cannot be tested.  Genetic testing has been recommended for the patient, but not done due to lack of insurance coverage. She reports are multiple other relatives with pancreatic cancer also.   Plan:  She is followed for essential thrombocytosis. She has been off hydroxyurea for 1 year now with stable platelets and mild anemia and leukopenia.  Patient states that she feels fair and still complains of pain of the right flank and bilateral hip joints. She had her annual mammogram today and I will let her know the results when available. She is going for her repeat pancreatic MRI today after this appointment to follow up on the large cystic lesion in the central pancreas. This is especially concerning with her strong family history of pancreatic cancer but the EUS and biopsy were benign. Her CA 19-9 has remained mildly elevated  but stable in the 60's. She reports fatigue. Her pain is fairly well controlled with oxycodone  10 mg every 4 hours. 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  She uses her walker, but continues to have falls. Her husband was hospitalized for an extended period of time for heart disease and renal failure. She continues to care for him and does his dialysis at home.  Her  blood pressure is high today, but she has not taken her medication yet. Her WBC is up to 3.4 from 3.1 and hemoglobin 11.1 from 10.6, with a normal platelet count of 241,000. Iron and folate levels are normal. CMP is unremarkable, including a glucose of 107. The patient understands the plans discussed today and is in agreement with them.  She knows to contact our office if she develops concerns prior to her next appointment.   I provided 20 minutes of face-to-face time during this encounter and > 50% was spent counseling as documented under my assessment and plan.   [name], MD  [facility] A DEPT OF [facility] [address] [place] [address] Dept: [phone] Dept Fax: [phone]   No orders of the defined types were placed in this encounter.   CHIEF COMPLAINT:  CC: Essential thrombocytosis  Current Treatment: Surveillance  HISTORY OF PRESENT ILLNESS:  Allison Stevens is a 58 y.o. female with a history of essential thrombocythemia originally diagnosed in January 2005.  She was treated with hydroxyurea, but was often non-compliant.  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  She has persistent pain in the left upper quadrant, attributed to splenomegaly, as well as bone pain, for which she had been on OxyContin  with oxycodone  for breakthrough.  She was subsequently tapered off OxyContin  and oxycodone  has been decreased.  We have been following a lesion in the pancreas seen incidentally on CT done in the emergency room for abdominal pain.  This has been felt to be a benign cyst.  Repeat MRI abdomen in November 2016, revealed a slight increase in the benign appearing cyst of the pancreas.  No splenomegaly was seen.  Repeat MRI abdomen in 1 year was recommended.  She was hospitalized in September 2017 and underwent colonoscopy with removal of a small polyp in the sigmoid colon.  Small  internal hemorrhoids were also seen.  Dr. Charlanne also obtained a CEA and CA 19-9, and. the CA 19-9 was elevated at 145, the CEA was normal. EGD in April 2018 showed mild gastritis, a small hiatal hernia, and a Schatzki ring.  She had esophageal dilation at that time.  MRI abdomen with contrast in September 2017 revealed a stable 14 mm cystic lesion in the pancreas.  Again, no splenomegaly was seen.  Follow-up MRI in 1 year was recommended. MRI abdomen in March 2019 revealed a 17 mm cystic lesion in the pancreatic uncinate process, which was felt to be stable.  No significant liver lesions were seen. Repeat MRI abdomen in 1 year was recommended.  MRI abdomen in September 2019 revealed a stable 19 mm cystic lesion of the pancreas, which appeared to be benign and still felt to be stable.  Repeat MRI in 1 year was recommended.  MRI abdomen in October 2020 revealed a stable 17 mm cystic lesion of the pancreas. Continued yearly follow up MRI abdomen was recommended.  MRI abdomen in September 2021 revealed a 1.9 cm cystic lesion in the  pancreatic uncinate process, which showed no significant change compared to more recent exams, but has shown a slow increase in size since 2016. 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 Follow-up by MRI in 2 years was recommended.    Screening mammogram in February 2016 revealed a possible mass in the right breast.  Right diagnostic mammogram and ultrasound was done and revealed 2 well circumscribed hypoechoic lesions in the right breast at 11 o 'clock, felt to represent cysts.  Repeat mammogram and ultrasound in August revealed heterogeneous fibroglandular tissue felt to be benign, with resolution of the previously seen cysts and no masses.  Repeat diagnostic bilateral mammogram and ultrasound in 6 months was once again recommended. Bilateral diagnostic mammogram in March 2017 did not reveal any evidence of  malignancy, so 1 year follow-up screening mammogram was recommended.  Bilateral screening mammogram in March 2018 did not reveal any evidence of malignancy.  Bilateral screening mammogram in December 2020 did not reveal any evidence of malignancy.  Bilateral screening mammogram in November 2022 was negative.   Bone density scan in June 2018 revealed osteopenia with a T-score of -1.5 in the femur.  The patient was instructed to take calcium  and vitamin-D twice daily.    The patient's family history is significant in that her father had pancreatic cancer at age 32, a paternal uncle had pancreatic cancer and a paternal aunt had pancreatic cancer.  Her brother also had pancreatic cancer at age 72.  She is appropriate for genetic testing, but this has not been done yet due to insurance coverage.   MRI abdomen in November 2023 revealed significant interval enlargement of a thinly septated cystic lesion of the central pancreatic head, now measuring 3.8 x 3.0 cm, previously 2.5 x 2.0 cm. This appears to communicate with the adjacent main pancreatic duct, and as previously reported, this has been observed to grow very slowly over a long period of time on examinations dating back to at least 2015. 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 Given substantial interval growth and size and greater than 2.5 cm, recommend EUS/FNA for definitive diagnosis.  She was referred to gastroenterology in [facility] and was scheduled with Dr. Wilhelmenia in January 2024, but did not keep her appointment.  MRI abdomen in May 2024 revealed continued enlargement of a lobulated, thinly septated cystic lesion in the central pancreatic head now measuring 4.5 x 3.7 cm.  EUS/FNA was once again recommended.  She was referred back and saw Dr. Wilhelmenia.  Her EUS had to be delayed due to hypertension.  She finally had this done in October and cytology was negative.  She had colonoscopy in April 2024 with Misenheimer, but this was incomplete due to poor  prep.  EGD revealed esophageal stricture which was dilated again.  Bilateral screening mammogram in May 2024 was negative.  Bilateral screening mammogram in May 2024 did not reveal any evidence of malignancy.  She had been on hydroxyurea 500 mg 4 times a day for many years.  Hydroxyurea was decreased to 3 times a day in September 2022, as her platelets were controlled and she was more anemic with a hemoglobin of 10. Repeat CBC in November 2022 revealed her hemoglobin to be back up to 11.6 and platelets remained in good range so we continued hydroxyurea 500 mg 3 times daily.  At some point, the patient started taking hydroxyurea 4 times daily.  She developed mild leukopenia in January 2024, so so we  decreased hydroxyurea back to 3 times daily.  She had worsening neutropenia and anemia in May 2024, so we decreased hydroxyurea to once daily.  Since she had persistent neutropenia and anemia, hydroxyurea was discontinued in July of 2024.  She had some improvement in the neutropenia with stable anemia and platelets normal, so she has remained off hydroxyurea.  INTERVAL HISTORY:  Dontasia is here today for repeat clinical assessment for her essential thrombocytosis. She has been off hydroxyurea for 1 year now with stable platelets and mild anemia and leukopenia.  Patient states that she feels fair and still complains of pain of the right flank and bilateral hip joints. She had her annual mammogram today and I will let her know the results when available. She is going for her repeat pancreatic MRI today after this appointment to follow up on the large cystic lesion in the central pancreas. This is especially concerning with her strong family history of pancreatic cancer but the EUS and biopsy were benign. Her CA 19-9 has remained mildly elevated but stable in the 60's. She reports fatigue. Her pain is fairly well controlled with oxycodone  10 mg every 4 hours. 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  She  uses her walker, but continues to have falls.  She tells me she used alprazolam  briefly when her husband coded and was hospitalized for an extended period of time. She continues to care for her husband and does his dialysis at home.  Her blood pressure is high today, but she has not taken her medication yet. Her WBC is up to 3.4 from 3.1 and hemoglobin 11.1 from 10.6, with a normal platelet count of 241,000. Iron and folate levels are normal. CMP is unremarkable, including a glucose of 107.  She denies fever, chills, night sweats, or other signs of infection. She denies cardiorespiratory and gastrointestinal issues.  Her appetite is stable and Her weight has increased 2 pounds over last 3 months.    REVIEW OF SYSTEMS:  Review of Systems  Constitutional:  Positive for fatigue. Negative for appetite change, chills, diaphoresis, fever and unexpected weight change.  HENT:   Negative for lump/mass, mouth sores, sore throat and trouble swallowing.   Respiratory:  Negative for cough and shortness of breath.   Cardiovascular:  Negative for chest pain and leg swelling.  Gastrointestinal:  Positive for nausea. Negative for abdominal pain, blood in stool, constipation, diarrhea and vomiting.  Endocrine: Negative for hot flashes.  Genitourinary:  Negative for difficulty urinating, dysuria, frequency, hematuria and vaginal bleeding.   Musculoskeletal:  Positive for gait problem (since stroke, uses walker). Negative for arthralgias, back pain, myalgias and neck pain.  Skin:  Negative for itching and rash.  Neurological:  Positive for extremity weakness (since stroke) and gait problem (since stroke, uses walker). Negative for dizziness, headaches and light-headedness.  Hematological:  Negative for adenopathy. Does not bruise/bleed easily.  Psychiatric/Behavioral:  Negative for depression and sleep disturbance. The patient is not nervous/anxious.      VITALS:  Blood pressure (!) 212/85, pulse 70, temperature 97.6  F (36.4 C), temperature source Oral, resp. rate 18, height 5' 4 (1.626 m), weight 139 lb 6.4 oz (63.2 kg), SpO2 100%.  Wt Readings from Last 3 Encounters:  01/04/24 139 lb 6.4 oz (63.2 kg)  10/11/23 137 lb 6.4 oz (62.3 kg)  09/06/23 140 lb 6.4 oz (63.7 kg)    Body mass index is 23.93 kg/m.  Performance status (ECOG): 2 - Symptomatic, <50% confined to bed  PHYSICAL EXAM:  Physical Exam Vitals and nursing note reviewed.  Constitutional:      General: She is not in acute distress.    Appearance: She is ill-appearing (chronically ill appearing).  HENT:     Head: Normocephalic and atraumatic.     Mouth/Throat:     Mouth: Mucous membranes are moist.     Pharynx: Oropharynx is clear. No oropharyngeal exudate or posterior oropharyngeal erythema.   Eyes:     General: No scleral icterus.    Extraocular Movements: Extraocular movements intact.     Conjunctiva/sclera: Conjunctivae normal.     Pupils: Pupils are equal, round, and reactive to light.    Cardiovascular:     Rate and Rhythm: Normal rate and regular rhythm.     Heart sounds: Normal heart sounds. No murmur heard.    No friction rub. No gallop.  Pulmonary:     Effort: Pulmonary effort is normal.     Breath sounds: Normal breath sounds. No wheezing, rhonchi or rales.  Abdominal:     General: There is no distension.     Palpations: Abdomen is soft. There is no hepatomegaly, splenomegaly or mass.     Tenderness: There is no abdominal tenderness.   Musculoskeletal:        General: Normal range of motion.     Cervical back: Normal range of motion and neck supple. No tenderness.     Right lower leg: No edema.     Left lower leg: No edema.  Lymphadenopathy:     Cervical: No cervical adenopathy.     Upper Body:     Right upper body: No supraclavicular or axillary adenopathy.     Left upper body: No supraclavicular or axillary adenopathy.     Lower Body: No right inguinal adenopathy. No left inguinal adenopathy.   Skin:     General: Skin is warm and dry.     Coloration: Skin is not jaundiced.     Findings: No rash.   Neurological:     Mental Status: She is alert and oriented to person, place, and time.     Cranial Nerves: No cranial nerve deficit.   Psychiatric:        Mood and Affect: Mood normal.        Behavior: Behavior normal.        Thought Content: Thought content normal.     LABS:      Latest Ref Rng & Units 01/04/2024    1:10 PM 09/06/2023    1:39 PM 05/09/2023    1:49 PM  CBC  WBC 4.0 - 10.5 K/uL 3.4  3.1  3.2   Hemoglobin 12.0 - 15.0 g/dL 88.8  89.3  89.5   Hematocrit 36.0 - 46.0 % 32.6  31.0  32.0   Platelets 150 - 400 K/uL 241  283  272       Latest Ref Rng & Units 01/04/2024    1:10 PM 09/06/2023    1:39 PM 07/11/2023    2:40 PM  CMP  Glucose 70 - 99 mg/dL 892  98  862   BUN 6 - 20 mg/dL 12  8  9    Creatinine 0.44 - 1.00 mg/dL 9.17  9.19  9.04   Sodium 135 - 145 mmol/L 143  142  141   Potassium 3.5 - 5.1 mmol/L 3.6  3.7  4.2   Chloride 98 - 111 mmol/L 106  107  103   CO2 22 - 32 mmol/L 26  27  23   Calcium  8.9 - 10.3 mg/dL 89.9  9.6  9.2   Total Protein 6.5 - 8.1 g/dL 7.1  7.0  6.4   Total Bilirubin 0.0 - 1.2 mg/dL 0.4  0.3  0.4   Alkaline Phos 38 - 126 U/L 94  91  112   AST 15 - 41 U/L 20  23  18    ALT 0 - 44 U/L 20  18  18     Lab Results  Component Value Date   TSH 3.380 08/08/2022   Lab Results  Component Value Date   CEA1 1.9 11/16/2022   /  CEA  Date Value Ref Range Status  11/16/2022 1.9 0.0 - 4.7 ng/mL Final    Comment:    (NOTE)                             Nonsmokers          <3.9                             Smokers             <5.6 [employer] Electrochemiluminescence Immunoassay (ECLIA) Values obtained with different assay methods or kits cannot be used interchangeably.  Results cannot be interpreted as absolute evidence of the presence or absence of malignant disease. Performed At: [facility] [address] [place], [place]  [id number] Jennette Shorter MD Ey:[phone]    No results found for: PSA1 Lab Results  Component Value Date   RJW800 51 (H) 01/04/2024   Lab Results  Component Value Date   TIBC 358 01/04/2024   TIBC 259 08/08/2022   TIBC 301 11/09/2021   FERRITIN 58 01/04/2024   FERRITIN 134 08/08/2022   FERRITIN 59 11/09/2021   IRONPCTSAT 22 01/04/2024   IRONPCTSAT 42 08/08/2022   IRONPCTSAT 26 11/09/2021     STUDIES:  MM 3D SCREENING MAMMOGRAM BILATERAL BREAST Result Date: 01/10/2024 CLINICAL DATA:  Screening. EXAM: DIGITAL SCREENING BILATERAL MAMMOGRAM WITH TOMOSYNTHESIS AND CAD TECHNIQUE: Bilateral screening digital craniocaudal and mediolateral oblique mammograms were obtained. Bilateral screening digital breast tomosynthesis was performed. The images were evaluated with computer-aided detection. COMPARISON:  Previous exam(s). ACR Breast Density Category b: There are scattered areas of fibroglandular density. FINDINGS: There are no findings suspicious for malignancy. IMPRESSION: No mammographic evidence of malignancy. A result letter of this screening mammogram will be mailed directly to the patient. RECOMMENDATION: Screening mammogram in one year. (Code:SM-B-01Y) BI-RADS CATEGORY  1: Negative. Electronically Signed   By: Craig Farr M.D.   On: 01/10/2024 09:05   MR ABDOMEN MRCP W WO CONTAST Result Date: 01/05/2024 CLINICAL DATA:  Pancreatic cyst EXAM: MRI ABDOMEN WITHOUT AND WITH CONTRAST (INCLUDING MRCP) TECHNIQUE: Multiplanar multisequence MR imaging of the abdomen was performed both before and after the administration of intravenous contrast. Heavily T2-weighted images of the biliary and pancreatic ducts were obtained, and three-dimensional MRCP images were rendered by post processing. CONTRAST:  6mL GADAVIST  GADOBUTROL  1 MMOL/ML IV SOLN COMPARISON:  12/21/2022 FINDINGS: Lower chest: No acute abnormality. Hepatobiliary: No focal liver abnormality is seen. Status post cholecystectomy. Unchanged  postoperative biliary ductal dilatation. Pancreas: No significant change in a lobulated, thinly septated cystic lesion in the central pancreatic head with hemorrhagic or proteinaceous contents, measuring 4.5 x 3.7 cm (series 4, image 23). No solid component or suspicious contrast enhancement. No pancreatic ductal dilatation or surrounding inflammatory changes. Spleen:  Spleen at the upper limit of normal in size measuring 13.0 cm. Adrenals/Urinary Tract: Adrenal glands are unremarkable. Kidneys are normal, without renal calculi, solid lesion, or hydronephrosis. Stomach/Bowel: Stomach is within normal limits. No evidence of bowel wall thickening, distention, or inflammatory changes. Vascular/Lymphatic: No significant vascular findings are present. No enlarged abdominal lymph nodes. Other: No abdominal wall hernia or abnormality. No ascites. Musculoskeletal: No acute or significant osseous findings. IMPRESSION: 1. No significant change in a lobulated, thinly septated cystic lesion in the central pancreatic head, measuring 4.5 x 3.7 cm. No solid component or suspicious contrast enhancement. No pancreatic ductal dilatation or surrounding inflammatory changes. 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 As previously reported, consider EUS/FNA for definitive diagnosis if not previously performed, and recommend ongoing imaging surveillance given size. 2. Status post cholecystectomy. Unchanged postoperative biliary ductal dilatation. Electronically Signed   By: Marolyn JONETTA Jaksch M.D.   On: 01/05/2024 07:21      HISTORY:   Past Medical History:  Diagnosis Date   Acid reflux    Anxiety    Carpal tunnel syndrome    Cerebrovascular disease    Diabetic retinopathy (HCC)    Family history of pancreatic cancer 08/09/2021   Gait abnormality    History of stroke    Hyperlipidemia    Hypertension    Migraine    Pancreatic cyst 08/09/2021   Retinal detachment     Thrombocytosis    Trigger finger    Ulnar nerve entrapment     Past Surgical History:  Procedure Laterality Date   BIOPSY  05/11/2023   Procedure: BIOPSY;  Surgeon: Wilhelmenia Aloha Raddle., MD;  Location: [facility];  Service: Gastroenterology;;   CESAREAN SECTION  1997   ESOPHAGOGASTRODUODENOSCOPY N/A 05/11/2023   Procedure: ESOPHAGOGASTRODUODENOSCOPY (EGD);  Surgeon: Wilhelmenia Aloha Raddle., MD;  Location: [facility];  Service: Gastroenterology;  Laterality: N/A;   EUS N/A 05/11/2023   Procedure: UPPER ENDOSCOPIC ULTRASOUND (EUS) RADIAL;  Surgeon: Wilhelmenia Aloha Raddle., MD;  Location: [facility];  Service: Gastroenterology;  Laterality: N/A;   FINE NEEDLE ASPIRATION N/A 05/11/2023   Procedure: FINE NEEDLE ASPIRATION (FNA) LINEAR;  Surgeon: Wilhelmenia Aloha Raddle., MD;  Location: [facility];  Service: Gastroenterology;  Laterality: N/A;   GALLBLADDER SURGERY  2011    Family History  Problem Relation Age of Onset   Diabetes Mother    Cancer Mother    Pancreatic cancer Father    Pancreatic cancer Brother    Pancreatic cancer Brother    Healthy Child    Pancreatic cancer Paternal Uncle     Social History:  reports that she has never smoked. She has never used smokeless tobacco. She reports that she does not drink alcohol  and does not use drugs.The patient is alone today.  Allergies:  Allergies  Allergen Reactions   Iodinated Contrast Media Other (See Comments) and Shortness Of Breath   Morphine  Itching and Other (See Comments)    Makes me feel out of my mind per pt.    Current Medications: Current Outpatient Medications  Medication Sig Dispense Refill   atorvastatin  (LIPITOR) 40 MG tablet      hydrochlorothiazide (MICROZIDE) 12.5 MG capsule Take 12.5 mg by mouth.     ALPRAZolam  (XANAX ) 0.5 MG tablet Take 1 tablet (0.5 mg total) by mouth every 8 (eight) hours as needed for anxiety. 90 tablet 2   amLODipine  (NORVASC ) 10 MG tablet TAKE 1 TABLET BY MOUTH EVERY DAY  90 tablet 0  BD PEN NEEDLE NANO 2ND GEN 32G X 4 MM MISC CHECK BLOOD SUGAR THREE TIMES A DAY 100 each 3   carvedilol  (COREG ) 12.5 MG tablet TAKE 1 TABLET BY MOUTH 2 TIMES DAILY. 180 tablet 0   clopidogrel  (PLAVIX ) 75 MG tablet Take 1 tablet (75 mg total) by mouth daily.     fluticasone  (FLONASE ) 50 MCG/ACT nasal spray PLACE 1 SPRAY INTO BOTH NOSTRILS IN THE MORNING AND AT BEDTIME. 48 mL 1   fluticasone  (FLOVENT  HFA) 44 MCG/ACT inhaler      insulin  aspart (NOVOLOG  FLEXPEN) 100 UNIT/ML FlexPen Inject 35 Units into the skin 3 (three) times daily with meals. 15 mL 11   lisinopril (ZESTRIL) 20 MG tablet Take 40 mg by mouth.     metFORMIN  (GLUCOPHAGE ) 1000 MG tablet TAKE 1 TABLET (1,000 MG TOTAL) BY MOUTH TWICE A DAY WITH FOOD 180 tablet 0   naloxone  (NARCAN ) nasal spray 4 mg/0.1 mL As directed 2 each 1   ondansetron  (ZOFRAN ) 4 MG tablet Take 1 tablet (4 mg total) by mouth every 8 (eight) hours as needed for nausea or vomiting. 20 tablet 0   Oxycodone  HCl 10 MG TABS Take 1 tablet (10 mg total) by mouth every 4 (four) hours as needed. 120 tablet 0   Potassium Chloride  ER 20 MEQ TBCR TAKE 1 TABLET (20 MEQ TOTAL) BY MOUTH IN THE MORNING, AT NOON, AND AT BEDTIME. 270 tablet 2   prochlorperazine (COMPAZINE) 10 MG tablet Take 10 mg by mouth every 6 (six) hours as needed for nausea or vomiting.     RABEprazole  (ACIPHEX ) 20 MG tablet TAKE 1 TABLET BY MOUTH EVERY DAY 30 tablet 0   rosuvastatin  (CRESTOR ) 20 MG tablet Take 1 tablet (20 mg total) by mouth daily. 30 tablet 11   TOUJEO  MAX SOLOSTAR 300 UNIT/ML Solostar Pen Inject 70 Units into the skin daily. SMARTSIG:80 Unit(s) SUB-Q Every Night 7 mL 2   No current facility-administered medications for this visit.     I,Jasmine M Lassiter,acting as a scribe for [name], MD.,have documented all relevant documentation on the behalf of [name], MD,as directed by  [name], MD while in the presence of [name], MD.

## 2024-01-04 ENCOUNTER — Encounter (HOSPITAL_BASED_OUTPATIENT_CLINIC_OR_DEPARTMENT_OTHER): Payer: Self-pay | Admitting: Radiology

## 2024-01-04 ENCOUNTER — Ambulatory Visit (HOSPITAL_BASED_OUTPATIENT_CLINIC_OR_DEPARTMENT_OTHER): Admitting: Radiology

## 2024-01-04 ENCOUNTER — Ambulatory Visit (HOSPITAL_BASED_OUTPATIENT_CLINIC_OR_DEPARTMENT_OTHER)
Admission: RE | Admit: 2024-01-04 | Discharge: 2024-01-04 | Disposition: A | Discharge status: Home / Self Care | Source: Ambulatory Visit | Attending: Hematology and Oncology | Admitting: Hematology and Oncology

## 2024-01-04 ENCOUNTER — Other Ambulatory Visit: Payer: Self-pay | Admitting: Oncology

## 2024-01-04 ENCOUNTER — Inpatient Hospital Stay: Payer: Medicare Other

## 2024-01-04 ENCOUNTER — Inpatient Hospital Stay: Payer: Medicare Other | Attending: Oncology | Admitting: Oncology

## 2024-01-04 VITALS — BP 212/85 | HR 70 | Temp 97.6°F | Resp 18 | Ht 64.0 in | Wt 139.4 lb

## 2024-01-04 DIAGNOSIS — K862 Cyst of pancreas: Secondary | ICD-10-CM

## 2024-01-04 DIAGNOSIS — I69354 Hemiplegia and hemiparesis following cerebral infarction affecting left non-dominant side: Secondary | ICD-10-CM | POA: Insufficient documentation

## 2024-01-04 DIAGNOSIS — D701 Agranulocytosis secondary to cancer chemotherapy: Secondary | ICD-10-CM | POA: Insufficient documentation

## 2024-01-04 DIAGNOSIS — R978 Other abnormal tumor markers: Secondary | ICD-10-CM | POA: Insufficient documentation

## 2024-01-04 DIAGNOSIS — Z800 Family history of malignant neoplasm of digestive organs: Secondary | ICD-10-CM | POA: Insufficient documentation

## 2024-01-04 DIAGNOSIS — D649 Anemia, unspecified: Secondary | ICD-10-CM | POA: Insufficient documentation

## 2024-01-04 DIAGNOSIS — Z1231 Encounter for screening mammogram for malignant neoplasm of breast: Secondary | ICD-10-CM | POA: Diagnosis not present

## 2024-01-04 DIAGNOSIS — D473 Essential (hemorrhagic) thrombocythemia: Secondary | ICD-10-CM

## 2024-01-04 DIAGNOSIS — D630 Anemia in neoplastic disease: Secondary | ICD-10-CM | POA: Diagnosis not present

## 2024-01-04 DIAGNOSIS — M25551 Pain in right hip: Secondary | ICD-10-CM | POA: Insufficient documentation

## 2024-01-04 DIAGNOSIS — M25552 Pain in left hip: Secondary | ICD-10-CM | POA: Diagnosis not present

## 2024-01-04 DIAGNOSIS — Z9049 Acquired absence of other specified parts of digestive tract: Secondary | ICD-10-CM | POA: Diagnosis not present

## 2024-01-04 LAB — CBC WITH DIFFERENTIAL (CANCER CENTER ONLY)
Abs Immature Granulocytes: 0.01 10*3/uL (ref 0.00–0.07)
Basophils Absolute: 0 10*3/uL (ref 0.0–0.1)
Basophils Relative: 0 %
Eosinophils Absolute: 0.1 10*3/uL (ref 0.0–0.5)
Eosinophils Relative: 3 %
HCT: 32.6 % — ABNORMAL LOW (ref 36.0–46.0)
Hemoglobin: 11.1 g/dL — ABNORMAL LOW (ref 12.0–15.0)
Immature Granulocytes: 0 %
Lymphocytes Relative: 28 %
Lymphs Abs: 1 10*3/uL (ref 0.7–4.0)
MCH: 31.3 pg (ref 26.0–34.0)
MCHC: 34 g/dL (ref 30.0–36.0)
MCV: 91.8 fL (ref 80.0–100.0)
Monocytes Absolute: 0.2 10*3/uL (ref 0.1–1.0)
Monocytes Relative: 4 %
Neutro Abs: 2.2 10*3/uL (ref 1.7–7.7)
Neutrophils Relative %: 65 %
Platelet Count: 241 10*3/uL (ref 150–400)
RBC: 3.55 MIL/uL — ABNORMAL LOW (ref 3.87–5.11)
RDW: 14.6 % (ref 11.5–15.5)
WBC Count: 3.4 10*3/uL — ABNORMAL LOW (ref 4.0–10.5)
nRBC: 0 % (ref 0.0–0.2)

## 2024-01-04 LAB — CMP (CANCER CENTER ONLY)
ALT: 20 U/L (ref 0–44)
AST: 20 U/L (ref 15–41)
Albumin: 4.4 g/dL (ref 3.5–5.0)
Alkaline Phosphatase: 94 U/L (ref 38–126)
Anion gap: 11 (ref 5–15)
BUN: 12 mg/dL (ref 6–20)
CO2: 26 mmol/L (ref 22–32)
Calcium: 10 mg/dL (ref 8.9–10.3)
Chloride: 106 mmol/L (ref 98–111)
Creatinine: 0.82 mg/dL (ref 0.44–1.00)
GFR, Estimated: >60 mL/min (ref >60)
Glucose, Bld: 107 mg/dL — ABNORMAL HIGH (ref 70–99)
Potassium: 3.6 mmol/L (ref 3.5–5.1)
Sodium: 143 mmol/L (ref 135–145)
Total Bilirubin: 0.4 mg/dL (ref 0.0–1.2)
Total Protein: 7.1 g/dL (ref 6.5–8.1)

## 2024-01-04 LAB — IRON AND TIBC
Iron: 79 ug/dL (ref 28–170)
Saturation Ratios: 22 % (ref 10.4–31.8)
TIBC: 358 ug/dL (ref 250–450)
UIBC: 279 ug/dL

## 2024-01-04 LAB — FERRITIN: Ferritin: 58 ng/mL (ref 11–307)

## 2024-01-04 LAB — FOLATE: Folate: 12.5 ng/mL (ref >5.9)

## 2024-01-04 MED ORDER — GADOBUTROL 1 MMOL/ML IV SOLN
6.0000 mL | Freq: Once | INTRAVENOUS | Status: AC | PRN
  Administered 2024-01-04: 6 mL via INTRAVENOUS

## 2024-01-05 LAB — CANCER ANTIGEN 19-9: CA 19-9: 51 U/mL — ABNORMAL HIGH (ref 0–35)

## 2024-01-06 ENCOUNTER — Other Ambulatory Visit: Payer: Self-pay | Admitting: Internal Medicine

## 2024-01-07 ENCOUNTER — Other Ambulatory Visit: Payer: Self-pay | Admitting: Internal Medicine

## 2024-01-08 ENCOUNTER — Other Ambulatory Visit: Payer: Self-pay

## 2024-01-08 DIAGNOSIS — R161 Splenomegaly, not elsewhere classified: Secondary | ICD-10-CM

## 2024-01-08 DIAGNOSIS — M898X9 Other specified disorders of bone, unspecified site: Secondary | ICD-10-CM

## 2024-01-08 DIAGNOSIS — G894 Chronic pain syndrome: Secondary | ICD-10-CM

## 2024-01-08 MED ORDER — TOUJEO MAX SOLOSTAR 300 UNIT/ML ~~LOC~~ SOPN: 70.0000 [IU] | PEN_INJECTOR | Freq: Every day | SUBCUTANEOUS | 2 refills | Status: DC

## 2024-01-08 MED ORDER — NOVOLOG FLEXPEN 100 UNIT/ML ~~LOC~~ SOPN: 35.0000 [IU] | PEN_INJECTOR | Freq: Three times a day (TID) | SUBCUTANEOUS | 11 refills | Status: DC

## 2024-01-08 MED ORDER — OXYCODONE HCL 10 MG PO TABS: 10.0000 mg | ORAL_TABLET | ORAL | 0 refills | Status: DC | PRN

## 2024-01-10 ENCOUNTER — Telehealth: Payer: Self-pay

## 2024-01-10 NOTE — Telephone Encounter (Signed)
 Attempted to contact patient. No answer and no VM.

## 2024-01-10 NOTE — Telephone Encounter (Signed)
-----   Message from [name] sent at 01/05/2024 12:08 PM EDT ----- Regarding: call Tell her MRI looks good, the cyst of her pancreas is the same and no other problems seen

## 2024-01-11 ENCOUNTER — Telehealth: Payer: Self-pay

## 2024-01-11 NOTE — Telephone Encounter (Signed)
 Patient notified and voiced understanding.

## 2024-01-11 NOTE — Telephone Encounter (Signed)
-----   Message from Alfonso Ike sent at 01/10/2024  4:34 PM EDT ----- Please let her know her mammogram was negative. Thanks

## 2024-01-12 ENCOUNTER — Ambulatory Visit: Admitting: Internal Medicine

## 2024-01-12 ENCOUNTER — Telehealth: Payer: Self-pay

## 2024-01-12 NOTE — Telephone Encounter (Signed)
 Attempted to contact patient. No answer.

## 2024-01-12 NOTE — Telephone Encounter (Signed)
-----   Message from [name] sent at 01/05/2024 12:08 PM EDT ----- Regarding: call Tell her MRI looks good, the cyst of her pancreas is the same and no other problems seen

## 2024-01-20 ENCOUNTER — Encounter: Payer: Self-pay | Admitting: Oncology

## 2024-01-24 ENCOUNTER — Ambulatory Visit: Admitting: Internal Medicine

## 2024-01-24 ENCOUNTER — Encounter: Payer: Self-pay | Admitting: Internal Medicine

## 2024-01-24 ENCOUNTER — Other Ambulatory Visit: Payer: Self-pay | Admitting: Internal Medicine

## 2024-01-24 VITALS — BP 140/90 | HR 70 | Temp 97.6°F | Resp 18 | Ht 64.0 in | Wt 139.5 lb

## 2024-01-24 DIAGNOSIS — E782 Mixed hyperlipidemia: Secondary | ICD-10-CM

## 2024-01-24 DIAGNOSIS — E11311 Type 2 diabetes mellitus with unspecified diabetic retinopathy with macular edema: Secondary | ICD-10-CM | POA: Diagnosis not present

## 2024-01-24 DIAGNOSIS — I10 Essential (primary) hypertension: Secondary | ICD-10-CM

## 2024-01-24 DIAGNOSIS — K862 Cyst of pancreas: Secondary | ICD-10-CM

## 2024-01-24 MED ORDER — CARVEDILOL 12.5 MG PO TABS: 12.5000 mg | ORAL_TABLET | Freq: Two times a day (BID) | ORAL | 0 refills | Status: DC

## 2024-01-24 MED ORDER — INSULIN DEGLUDEC 100 UNIT/ML ~~LOC~~ SOPN: 70.0000 [IU] | PEN_INJECTOR | Freq: Every day | SUBCUTANEOUS | 5 refills | Status: DC

## 2024-01-24 NOTE — Progress Notes (Signed)
 Office Visit  Subjective   Patient ID: Allison Stevens   DOB: 12-27-1965   Age: 58 y.o.   MRN: [id number]   Chief Complaint Chief Complaint  Patient presents with   3 month follow up    History of stroke with residual effects.     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  She does follow with Hematology with Dr. Cornelius for essential thrombocytosis with her last visit in 01/04/2024.  She is currently treated with hydroxyruea where they decreased this dose to 500mg  3 times a day down to twice a day but this was then discontinued and she has had normal platelet counts on repeat testing.  The patient was diagnosed with thrombocytosis in 2005.  She has been noted to have persistent LUQ pain r/to to splenomegaly and she is being treated with Oxycodone  and Oxycontin .  Pertinent workup for her splenomegaly included colonoscopy which showed some polyps and she had a polypectomy. MRI done in in 06/2015 indicated a benign appearing cyst of the pancreas. MRI in 06/2016  showed a stable cystic lesion of the pancreas measuring 14mm with no splenomegaly. MRI of abd in 09/2017 revealed a 17mm cyst in the pancreatic uncinate process with no liver lesions noted. MRI in 04/2018 indicated a 19mm stable cystic lesion of the pancreas.  She had a MRI in 04/2020 and this unfortunately showed an increase in the pancreatic cyst from 2.5 cm to 3.8 cm in the last 2 years.  Dr. Cornelius wanted this further evaluated with an EUS.  There is a family history of pancreatic cancer with a first-degree relative with her brother who had pancreatic cancer.  He declined genetic testing.  Genetic testing has been recommended for the patient, but not done due to lack of insurance coverage.  The patient did see Dr. Larene on 11/29/2022 for a colon/EGD. She never went for the appointment that we referred for a EUS at that time and now it has been many months  since her MRI scan of the abdomen, which revealed enlargement of the pancreatic lesion.  She was re-referred to gastroenterology in [facility] for an EUS and saw Dr. Wilhelmenia. The EUS had to be rescheduled due to hypertension, but she was finally able to have EUS and biopsy of the pancreatic cyst in 05/2023. Cytology was negative.  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  Even though there had been some suggestion of a potential pancreatic duct connection, the normal CEA and glucose levels make mucinous cystic neoplasms and IPMN seem less likely.  Dr. Wilhelmenia recommended MRI/MRCP in 6 months, so she underwent a repeat MRCP on 01/04/2024 and this showed no significant change in a lobulated, thinly septated cystic lesion in the central pancreatic head, measuring 4.5 x 3.7 cm. No solid component or suspicious contrast enhancement. No pancreatic ductal dilatation or surrounding inflammatory changes. 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 Status post cholecystectomy with unchanged postoperative biliary ductal dilatation.  The patient is a 58 year old African American/Black female who returns for a follow-up visit for her T2 diabetes.  This past year, her A1c was elevated and I tried to refer her to endocrinology.  I am told by my referral clerk that none of the endocrinologists in the area will take her due to  her having appointments in the past and she would no show (noncompliance).  She was on Trulicity 4.5mg  subcut once a week but stopped it in January as she was getting nausea and vomiting from this medication.   She is supposed to be on Toujeo  70 Units daily (she was on 80 Units before) but she states her insurance stopped paying for this and she has not taken it for 3 weeks.  She is also on Novolog  35 Units subcut usually takes 3 times a day with meals and metformin  1000mg  BID.  She  previously told me that she was getting hypoglycemia on her toujeo  so she dropped the units from 80 to 70 Units daily.  She is not walking as much as they would like.  She specifically denies shortness of breath, unexplained fatigue, palpitations or racing heartbeats, syncopal episodes, and unexplained abdominal pain, nausea or vomiting.  She denies any hypoglycemia at this time.  She is now checking her blood sugars every day and they range 150-250.  She came in fasting today in anticipation of lab work. Her last HgbA1c was done 6 months ago and was 9.6%.  She has complications with diabetes with a history of diabetic retinopathy.  There are no complications of diabetic neuropathy, nephropathy or vascular disease.  Her last dilated eye exam was done on 02/03/2022 that showed mild diabetic retinopathy with mild paramacular edema in her eyes.  She has an appointment with her eye doctor in 01/2024.    The patient is a 58 year old African American/Black female who presents for a follow-up evaluation of hypertension.  Since her last visit, she denies any problems.  Since her last visit, she has not had any problems.  The patient has been checking her blood pressure at home. The patient's blood pressure has ranged systollically from 120's.  The patient's current medications include: amlodpine 10mg  daily, lisinopril 40mg  daily, and hydrochlorothiazide 12.5 mg daily.   She was on coreg  12.5mg  BID last year but for some reason she is no longer on this.  The patient has been tolerating her medications well. The patient denies any visual changes, lightheadness, shortness of breath, orthopnea, and weakness/numbness. She reports there have been no other symptoms noted.    Allison Stevens returns today for routine followup on her cholesterol. On her last visit, her cholesterol was not controlled with her history of stroke.  We changed her atorvastatin  to crestor  this past year.  Her goal LDL <50 with her history of stroke and  diabetes.  Overall, she states she is doing well and is without any complaints or problems at this time. She specifically denies chest pain, abdominal pain, nausea, diarrhea, and myalgias. She remains on dietary management as well as the following cholesterol lowering medications crestor  20 mg oral tablet qhs. She is fasting in anticipation for labs today.       Past Medical History Past Medical History:  Diagnosis Date   Acid reflux    Anxiety    Carpal tunnel syndrome    Cerebrovascular disease    Diabetic retinopathy (HCC)    Family history of pancreatic cancer 08/09/2021   Gait abnormality    History of stroke    Hyperlipidemia    Hypertension    Migraine    Pancreatic cyst 08/09/2021   Retinal detachment    Thrombocytosis    Trigger finger    Ulnar nerve entrapment      Allergies Allergies  Allergen Reactions   Iodinated Contrast Media  Other (See Comments) and Shortness Of Breath   Morphine  Itching and Other (See Comments)    Makes me feel out of my mind per pt.     Medications  Current Outpatient Medications:    ALPRAZolam  (XANAX ) 0.5 MG tablet, Take 1 tablet (0.5 mg total) by mouth every 8 (eight) hours as needed for anxiety., Disp: 90 tablet, Rfl: 2   amLODipine  (NORVASC ) 10 MG tablet, TAKE 1 TABLET BY MOUTH EVERY DAY, Disp: 90 tablet, Rfl: 0   BD PEN NEEDLE NANO 2ND GEN 32G X 4 MM MISC, CHECK BLOOD SUGAR THREE TIMES A DAY, Disp: 100 each, Rfl: 3   carvedilol  (COREG ) 12.5 MG tablet, TAKE 1 TABLET BY MOUTH 2 TIMES DAILY., Disp: 180 tablet, Rfl: 0   clopidogrel  (PLAVIX ) 75 MG tablet, Take 1 tablet (75 mg total) by mouth daily., Disp: , Rfl:    fluticasone  (FLONASE ) 50 MCG/ACT nasal spray, PLACE 1 SPRAY INTO BOTH NOSTRILS IN THE MORNING AND AT BEDTIME., Disp: 48 mL, Rfl: 1   fluticasone  (FLOVENT  HFA) 44 MCG/ACT inhaler, , Disp: , Rfl:    hydrochlorothiazide (MICROZIDE) 12.5 MG capsule, Take 12.5 mg by mouth., Disp: , Rfl:    insulin  aspart (NOVOLOG  FLEXPEN) 100  UNIT/ML FlexPen, Inject 35 Units into the skin 3 (three) times daily with meals., Disp: 15 mL, Rfl: 11   lisinopril (ZESTRIL) 20 MG tablet, Take 40 mg by mouth., Disp: , Rfl:    metFORMIN  (GLUCOPHAGE ) 1000 MG tablet, TAKE 1 TABLET (1,000 MG TOTAL) BY MOUTH TWICE A DAY WITH FOOD, Disp: 180 tablet, Rfl: 0   naloxone  (NARCAN ) nasal spray 4 mg/0.1 mL, As directed, Disp: 2 each, Rfl: 1   ondansetron  (ZOFRAN ) 4 MG tablet, Take 1 tablet (4 mg total) by mouth every 8 (eight) hours as needed for nausea or vomiting., Disp: 20 tablet, Rfl: 0   Oxycodone  HCl 10 MG TABS, Take 1 tablet (10 mg total) by mouth every 4 (four) hours as needed., Disp: 120 tablet, Rfl: 0   Potassium Chloride  ER 20 MEQ TBCR, TAKE 1 TABLET (20 MEQ TOTAL) BY MOUTH IN THE MORNING, AT NOON, AND AT BEDTIME., Disp: 270 tablet, Rfl: 2   prochlorperazine (COMPAZINE) 10 MG tablet, Take 10 mg by mouth every 6 (six) hours as needed for nausea or vomiting., Disp: , Rfl:    RABEprazole  (ACIPHEX ) 20 MG tablet, TAKE 1 TABLET BY MOUTH EVERY DAY, Disp: 30 tablet, Rfl: 0   rosuvastatin  (CRESTOR ) 20 MG tablet, Take 1 tablet (20 mg total) by mouth daily., Disp: 30 tablet, Rfl: 11   TOUJEO  MAX SOLOSTAR 300 UNIT/ML Solostar Pen, Inject 70 Units into the skin daily. SMARTSIG:80 Unit(s) SUB-Q Every Night, Disp: 7 mL, Rfl: 2   Review of Systems Review of Systems  Constitutional:  Negative for chills, fever and malaise/fatigue.  Eyes:  Negative for blurred vision and double vision.  Respiratory:  Negative for cough and shortness of breath.   Cardiovascular:  Negative for chest pain, palpitations and leg swelling.  Gastrointestinal:  Negative for abdominal pain, constipation, diarrhea, nausea and vomiting.  Genitourinary:  Negative for frequency.  Musculoskeletal:  Negative for myalgias.  Skin:  Negative for itching and rash.  Neurological:  Negative for dizziness, weakness and headaches.  Endo/Heme/Allergies:  Negative for polydipsia.       Objective:     Vitals BP (!) 140/90   Pulse 70   Temp 97.6 F (36.4 C)   Resp 18   Ht 5' 4 (1.626 m)   Wt  139 lb 8 oz (63.3 kg)   SpO2 97%   BMI 23.95 kg/m    Physical Examination Physical Exam Constitutional:      Appearance: Normal appearance. She is not ill-appearing.   Cardiovascular:     Rate and Rhythm: Normal rate and regular rhythm.     Pulses: Normal pulses.     Heart sounds: No murmur heard.    No friction rub. No gallop.  Pulmonary:     Effort: Pulmonary effort is normal. No respiratory distress.     Breath sounds: No wheezing, rhonchi or rales.  Abdominal:     General: Bowel sounds are normal. There is no distension.     Palpations: Abdomen is soft.     Tenderness: There is no abdominal tenderness.   Musculoskeletal:     Right lower leg: No edema.     Left lower leg: No edema.   Skin:    General: Skin is warm and dry.     Findings: No rash.   Neurological:     General: No focal deficit present.     Mental Status: She is alert and oriented to person, place, and time.   Psychiatric:        Mood and Affect: Mood normal.        Behavior: Behavior normal.        Assessment & Plan:   Essential hypertension It looks like her coreg  fell off her medicine list.  I am going to restart her coreg  for better BP control and continue on amlodipine  10mg  daily, lisinopril 10mg  daily and hydrochlorothiazide 12.5mg  daily.  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  We will do a HgBA1c today but this is going to be high.  We will change her toujeo  to tresbia and give 70 Units daily and continue on novolog  and metformin .  She goes next month for an eye exam.  Hyperlipidemia We changed her lipitor to crestor .  I am going to check a cholesterol panel today with goal LDL <50 with her history of stroke.  Pancreatic cyst Her recent MRCP and notes from oncology were reviewed.     Return in about 3 months (around 04/25/2024).   Selinda Fleeta Finger, MD

## 2024-01-24 NOTE — Assessment & Plan Note (Signed)
 She has not taken her toujeo  for 3 weeks.  We will do a HgBA1c today but this is going to be high.  We will change her toujeo  to tresbia and give 70 Units daily and continue on novolog  and metformin .  She goes next month for an eye exam.

## 2024-01-24 NOTE — Assessment & Plan Note (Signed)
 It looks like her coreg  fell off her medicine list.  I am going to restart her coreg  for better BP control and continue on amlodipine  10mg  daily, lisinopril 10mg  daily and hydrochlorothiazide 12.5mg  daily.

## 2024-01-24 NOTE — Assessment & Plan Note (Signed)
 Her recent MRCP and notes from oncology were reviewed.

## 2024-01-24 NOTE — Assessment & Plan Note (Signed)
 We changed her lipitor to crestor .  I am going to check a cholesterol panel today with goal LDL <50 with her history of stroke.

## 2024-01-25 ENCOUNTER — Other Ambulatory Visit: Payer: Self-pay

## 2024-01-27 ENCOUNTER — Other Ambulatory Visit: Payer: Self-pay | Admitting: Internal Medicine

## 2024-01-28 ENCOUNTER — Other Ambulatory Visit: Payer: Self-pay | Admitting: Internal Medicine

## 2024-01-30 ENCOUNTER — Ambulatory Visit

## 2024-01-31 ENCOUNTER — Other Ambulatory Visit: Payer: Self-pay | Admitting: Internal Medicine

## 2024-01-31 ENCOUNTER — Other Ambulatory Visit: Payer: Self-pay

## 2024-01-31 MED ORDER — RABEPRAZOLE SODIUM 20 MG PO TBEC: 20.0000 mg | DELAYED_RELEASE_TABLET | Freq: Every day | ORAL | 0 refills | Status: AC

## 2024-01-31 NOTE — Progress Notes (Signed)
 Rx refill 90 day request

## 2024-02-06 ENCOUNTER — Other Ambulatory Visit: Payer: Self-pay

## 2024-02-06 DIAGNOSIS — R161 Splenomegaly, not elsewhere classified: Secondary | ICD-10-CM

## 2024-02-06 DIAGNOSIS — M898X9 Other specified disorders of bone, unspecified site: Secondary | ICD-10-CM

## 2024-02-06 DIAGNOSIS — G894 Chronic pain syndrome: Secondary | ICD-10-CM

## 2024-02-06 MED ORDER — OXYCODONE HCL 10 MG PO TABS: 10.0000 mg | ORAL_TABLET | ORAL | 0 refills | Status: DC | PRN

## 2024-02-19 ENCOUNTER — Telehealth: Payer: Self-pay

## 2024-02-19 NOTE — Telephone Encounter (Signed)
 Phone call From Patient

## 2024-02-27 ENCOUNTER — Telehealth: Payer: Self-pay

## 2024-02-27 NOTE — Telephone Encounter (Signed)
 It is too early fo refill. Pharmacy told her it could be given on 03/06/2024. She just wanted to give us  plenty of notice.

## 2024-03-03 ENCOUNTER — Other Ambulatory Visit: Payer: Self-pay | Admitting: Internal Medicine

## 2024-03-03 DIAGNOSIS — M898X9 Other specified disorders of bone, unspecified site: Secondary | ICD-10-CM

## 2024-03-03 DIAGNOSIS — R161 Splenomegaly, not elsewhere classified: Secondary | ICD-10-CM

## 2024-03-03 DIAGNOSIS — G894 Chronic pain syndrome: Secondary | ICD-10-CM

## 2024-03-06 ENCOUNTER — Encounter: Payer: Self-pay | Admitting: Oncology

## 2024-03-06 ENCOUNTER — Telehealth: Payer: Self-pay

## 2024-03-06 DIAGNOSIS — I10 Essential (primary) hypertension: Secondary | ICD-10-CM | POA: Diagnosis not present

## 2024-03-06 DIAGNOSIS — G8191 Hemiplegia, unspecified affecting right dominant side: Secondary | ICD-10-CM | POA: Diagnosis not present

## 2024-03-06 DIAGNOSIS — Z79899 Other long term (current) drug therapy: Secondary | ICD-10-CM | POA: Diagnosis not present

## 2024-03-06 DIAGNOSIS — I69354 Hemiplegia and hemiparesis following cerebral infarction affecting left non-dominant side: Secondary | ICD-10-CM | POA: Diagnosis not present

## 2024-03-06 DIAGNOSIS — Z7982 Long term (current) use of aspirin: Secondary | ICD-10-CM | POA: Diagnosis not present

## 2024-03-06 DIAGNOSIS — R296 Repeated falls: Secondary | ICD-10-CM | POA: Diagnosis not present

## 2024-03-06 DIAGNOSIS — R2689 Other abnormalities of gait and mobility: Secondary | ICD-10-CM | POA: Diagnosis not present

## 2024-03-06 DIAGNOSIS — Z8673 Personal history of transient ischemic attack (TIA), and cerebral infarction without residual deficits: Secondary | ICD-10-CM | POA: Diagnosis not present

## 2024-03-06 DIAGNOSIS — I69322 Dysarthria following cerebral infarction: Secondary | ICD-10-CM | POA: Diagnosis not present

## 2024-03-06 MED ORDER — OXYCODONE HCL 10 MG PO TABS: 10.0000 mg | ORAL_TABLET | ORAL | 0 refills | Status: DC | PRN

## 2024-03-06 NOTE — Telephone Encounter (Signed)
 Pt has called twice stating she is only going to get 100 tablets of the oxycodone . Pt states, What happened? I have called pharmacy to check on problem.

## 2024-03-13 ENCOUNTER — Ambulatory Visit: Admitting: Internal Medicine

## 2024-03-28 ENCOUNTER — Other Ambulatory Visit (HOSPITAL_BASED_OUTPATIENT_CLINIC_OR_DEPARTMENT_OTHER): Payer: Self-pay | Admitting: Neurology

## 2024-03-28 DIAGNOSIS — G8191 Hemiplegia, unspecified affecting right dominant side: Secondary | ICD-10-CM

## 2024-04-03 ENCOUNTER — Ambulatory Visit (INDEPENDENT_AMBULATORY_CARE_PROVIDER_SITE_OTHER)
Admission: RE | Admit: 2024-04-03 | Discharge: 2024-04-03 | Disposition: A | Discharge status: Home / Self Care | Source: Ambulatory Visit | Attending: Neurology | Admitting: Neurology

## 2024-04-03 DIAGNOSIS — G8191 Hemiplegia, unspecified affecting right dominant side: Secondary | ICD-10-CM

## 2024-04-03 DIAGNOSIS — I6782 Cerebral ischemia: Secondary | ICD-10-CM | POA: Diagnosis not present

## 2024-04-04 ENCOUNTER — Other Ambulatory Visit: Payer: Self-pay

## 2024-04-04 DIAGNOSIS — G894 Chronic pain syndrome: Secondary | ICD-10-CM

## 2024-04-04 DIAGNOSIS — M898X9 Other specified disorders of bone, unspecified site: Secondary | ICD-10-CM

## 2024-04-04 DIAGNOSIS — R161 Splenomegaly, not elsewhere classified: Secondary | ICD-10-CM

## 2024-04-04 MED ORDER — OXYCODONE HCL 10 MG PO TABS: 10.0000 mg | ORAL_TABLET | ORAL | 0 refills | Status: DC | PRN

## 2024-04-05 ENCOUNTER — Other Ambulatory Visit: Payer: Self-pay | Admitting: Internal Medicine

## 2024-04-07 ENCOUNTER — Other Ambulatory Visit: Payer: Self-pay | Admitting: Internal Medicine

## 2024-04-07 MED ORDER — ALPRAZOLAM 0.5 MG PO TABS: 0.5000 mg | ORAL_TABLET | Freq: Three times a day (TID) | ORAL | 2 refills | Status: DC | PRN

## 2024-04-10 NOTE — Progress Notes (Signed)
 [facility]  [address] [place],  [place]  [address] [phone]  Clinic Day: 04/16/24  Referring physician: Fleeta Valeria Mayo, MD  ASSESSMENT & PLAN:  Assessment: Essential thrombocytosis (HCC) Essential thrombocythemia, previously treated with hydroxyurea 500 mg 4 times daily. She developed leukopenia and anemia, so hydroxyurea was decreased to 3 times daily.  In April 2024, hydroxyurea was decreased to twice daily due to persistent leukopenia.  In June 2024, she had worsening leukopenia with the WBC  to 2.6, as well as worsening anemia.  Her platelets were normal at 274,000, so hydroxyurea was discontinued.  Her platelets have remained normal since that time.  She has had a little improvement in her leukopenia and anemia.    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  In November 2023, this increased significantly to 3.8 cm in maximum diameter and appears to communicate with the main pancreatic duct. She was gastroenterology in [place] but did not keep her appointment, she had a repeat MRI abdomen in May 2024 which revealed further enlargement of the pancreatic lesion to 4.5 cm. She has a strong family history for pancreatic cancer and another brother was referred to hospice because it was unresectable at the time of surgery.  She was re-referred to gastroenterology in [facility] for [facility] EUS and saw Dr. Wilhelmenia. The EUS had to be rescheduled due to hypertension, but she was finally able to have EUS and biopsy of the pancreatic cyst in October. Cytology was negative.  She had a repeat MRI of the pancreas in June of 2025, and there was actually mild decrease in the cyst.    Leukopenia due to antineoplastic chemotherapy (HCC) Her white count is stable at 3.1 with Stevens ANC 2300, which is stable.  Most likely this is from her hydroxyurea, and that has been stopped. Unfortunately, she is also at risk for development of acute  leukemia or myelofibrosis due to her myeloproliferative disorder as well as the hydroxyurea.    Anemia Mild chronic anemia, which is likely secondary to hydroxyurea, which has been discontinued. This has worsened probably due to blood loss at this time. She will need evaluation by GI and gynecology as discussed with Dr. Fleeta Valeria. He will order a pelvic ultrasound.   Acute and Chronic Strokes In the past this was due to her severe thrombocytosis but her platelet are under control now for the last 2 years. I explained this is more likely due to uncontrolled hypertension, diabetes, and hyperlipidemia. She is very non-compliant despite multiple discussions.    Family history of pancreatic cancer Two first-degree relatives, her brothers, with pancreatic cancer.  One brother declined genetic testing that we know of and he has expired. Another brother is on hospice, so cannot be tested.  Genetic testing has been recommended for the patient, but not done due to lack of insurance coverage. She reports are multiple other relatives with pancreatic cancer also.   Plan:  Patient complains of severe nausea, left abdominal pain rating a 7/10 that has persisted for the last 2 weeks, vaginal/rectal bleeding, slurred speech, and increased drooling. She is not on any anticoagulation at this time. She informed me that about a month ago she was experiencing slurred speech and right sided weakness and numbness and was suspected to have a stroke. She states that her vaginal/rectal bleeding began during this time of her suspected stroke. Her speech is slurred more than usual today and her BP today is  elevated at 190/74. She informed me that she has not taken her BP medication today. She now has [facility] appointment with Dr. Fleeta Finger on September 17th and I will contact him to discuss her new findings. I also explained to the patient that due to her uncontrolled BP, glucose, and cholesterol, this puts her more at risk of recurrent  strokes and she may not be able to recover from this. I will refer her to [facility] gynecologist and Dr. Larene. She had a CT head done on 04/03/2024 which revealed no acute intracranial abnormality, remote lacunar infarcts in the bilateral basal ganglia and right thalamus. However, the right thalamic infarct is new since 2024. Moderate chronic microvascular ischemic changes and generalized parenchymal volume loss, mild cerebellar volume loss, and brainstem atrophy were also noted and similar to her prior MRI. She has a low WBC of 3.2 with Stevens ANC 2200, low hemoglobin of 10.0 down from 11.1, and platelet count of 251,000. She has a severely low potassium of 2.4 but the rest of her CMP is normal.  We discussed having her go to the emergency room but she declined. I ordered IV potassium 40 meq today but she left after 20 meq were administered. She will see her PCP tomorrow to follow-up on her many issues including the bleeding. I will taper her oxycodone  from 10 mg to 5 mg with her next prescription. Her CA 19-9 in June was elevated at 51 but improved from 63; her CA 19.9 today is pending.  I will see her back in 1-2 months with CBC and CMP. The patient understands the plans discussed today and is in agreement with them.  She knows to contact our office if she develops concerns prior to her next appointment.  I provided 40 minutes of face-to-face time during this encounter and > 50% was spent counseling as documented under my assessment and plan.   [name], MD  [facility] DE[facility] OF [facility] [address] [place] [address] Dept: [phone] Dept Fax: [phone]   No orders of the defined types were placed in this encounter.   CHIEF COMPLAINT:  CC: Essential thrombocytosis  Current Treatment: Surveillance  HISTORY OF PRESENT ILLNESS:  Allison Stevens is a 58 y.o. female with a history of essential thrombocythemia  originally diagnosed in January 2005.  She was treated with hydroxyurea, but was often non-compliant.  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  She has persistent pain in the left upper quadrant, attributed to splenomegaly, as well as bone pain, for which she had been on OxyContin  with oxycodone  for breakthrough.  She was subsequently tapered off OxyContin  and oxycodone  has been decreased.  We have been following a lesion in the pancreas seen incidentally on CT done in the emergency room for abdominal pain.  This has been felt to be a benign cyst.  Repeat MRI abdomen in November 2016, revealed a slight increase in the benign appearing cyst of the pancreas.  No splenomegaly was seen.  Repeat MRI abdomen in 1 year was recommended.  She was hospitalized in September 2017 and underwent colonoscopy with removal of a small polyp in the sigmoid colon.  Small internal hemorrhoids were also seen.  Dr. Charlanne also obtained a CEA and CA 19-9, and. the CA 19-9 was elevated at 145, the CEA was normal. EGD in April 2018 showed mild gastritis, a small hiatal hernia, and  a Schatzki ring.  She had esophageal dilation at that time.  MRI abdomen with contrast in September 2017 revealed a stable 14 mm cystic lesion in the pancreas.  Again, no splenomegaly was seen.  Follow-up MRI in 1 year was recommended. MRI abdomen in March 2019 revealed a 17 mm cystic lesion in the pancreatic uncinate process, which was felt to be stable.  No significant liver lesions were seen. Repeat MRI abdomen in 1 year was recommended.  MRI abdomen in September 2019 revealed a stable 19 mm cystic lesion of the pancreas, which appeared to be benign and still felt to be stable.  Repeat MRI in 1 year was recommended.  MRI abdomen in October 2020 revealed a stable 17 mm cystic lesion of the pancreas. Continued yearly follow up MRI abdomen was recommended.  MRI abdomen in September 2021 revealed a 1.9 cm cystic lesion in the  pancreatic uncinate process, which showed no significant change compared to more recent exams, but has shown a slow increase in size since 2016. This was suspicious for Stevens indolent cystic neoplasm, such as a side-branch intraductal papillary mucinous neoplasm. Follow-up by MRI in 2 years was recommended.    Screening mammogram in February 2016 revealed a possible mass in the right breast.  Right diagnostic mammogram and ultrasound was done and revealed 2 well circumscribed hypoechoic lesions in the right breast at 11 o 'clock, felt to represent cysts.  Repeat mammogram and ultrasound in August revealed heterogeneous fibroglandular tissue felt to be benign, with resolution of the previously seen cysts and no masses.  Repeat diagnostic bilateral mammogram and ultrasound in 6 months was once again recommended. Bilateral diagnostic mammogram in March 2017 did not reveal any evidence of malignancy, so 1 year follow-up screening mammogram was recommended.  Bilateral screening mammogram in March 2018 did not reveal any evidence of malignancy. Bone density scan in June 2018 revealed osteopenia with a T-score of -1.5 in the femur.  The patient was instructed to take calcium  and vitamin-D twice daily.   Bilateral screening mammogram in December 2020 did not reveal any evidence of malignancy.  Bilateral screening mammogram in November 2022 was negative.   The patient's family history is significant in that her father had pancreatic cancer at age 31, a paternal uncle had pancreatic cancer and a paternal aunt had pancreatic cancer.  Her brother also had pancreatic cancer at age 52.  She is appropriate for genetic testing, but this has not been done yet due to insurance coverage.   MRI abdomen in November 2023 revealed significant interval enlargement of a thinly septated cystic lesion of the central pancreatic head, now measuring 3.8 x 3.0 cm, previously 2.5 x 2.0 cm. This appears to communicate with the adjacent main  pancreatic duct, and as previously reported, this has been observed to grow very slowly over a long period of time on examinations dating back to at least 2015. 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 Given substantial interval growth and size and greater than 2.5 cm, recommend EUS/FNA for definitive diagnosis.  She was referred to gastroenterology in [facility] and was scheduled with Dr. Wilhelmenia in January 2024, but did not keep her appointment.  MRI abdomen in May 2024 revealed continued enlargement of a lobulated, thinly septated cystic lesion in the central pancreatic head now measuring 4.5 x 3.7 cm.  EUS/FNA was once again recommended.  She was referred back and saw Dr. Wilhelmenia.  Her EUS had to be delayed due to hypertension.  She finally had  this done in October and cytology was negative.  She had colonoscopy in April 2024 with Misenheimer, but this was incomplete due to poor prep.  EGD revealed esophageal stricture which was dilated again.  Bilateral screening mammogram in May 2024 was negative.  Bilateral screening mammogram in May 2024 did not reveal any evidence of malignancy.  She had been on hydroxyurea 500 mg 4 times a day for many years.  Hydroxyurea was decreased to 3 times a day in September 2022, as her platelets were controlled and she was more anemic with a hemoglobin of 10. Repeat CBC in November 2022 revealed her hemoglobin to be back up to 11.6 and platelets remained in good range so we continued hydroxyurea 500 mg 3 times daily.  At some point, the patient started taking hydroxyurea 4 times daily.  She developed mild leukopenia in January 2024, so so we decreased hydroxyurea back to 3 times daily.  She had worsening neutropenia and anemia in May 2024, so we decreased hydroxyurea to once daily.  Since she had persistent neutropenia and anemia, hydroxyurea was discontinued in July of 2024.  She had some improvement in the neutropenia with stable anemia and platelets normal, so she has  remained off hydroxyurea.  INTERVAL HISTORY:  Lyndsay is here today for repeat clinical assessment for her essential thrombocytosis. She has been off hydroxyurea for over 1 year now with stable platelets and mild anemia and leukopenia. Patient states that she feels ok but complains of severe nausea, left abdominal pain rating a 7/10 that has persisted for the last 2 weeks, vaginal/rectal bleeding, slurred speech, and increased drooling. She is not on any anticoagulation at this time. She informed me that about a month ago she was experiencing slurred speech and right sided weakness and numbness and was suspected to have a stroke. She states that her vaginal/rectal bleeding began during this time of her suspected stroke. Her speech is slurred more than usual today and her BP today is elevated at 190/74. She informed me that she has not taken her BP medication today. She now has [facility] appointment with Dr. Fleeta Finger on September 17th and I will contact him to discuss her new findings. I also explained to the patient that due to her uncontrolled BP, glucose, and cholesterol, this puts her more at risk of recurrent strokes and she may not be able to recover from this. I will refer her to Stevens gynecologist and Dr. Larene. She had a CT head done on 04/03/2024 which revealed no acute intracranial abnormality, remote lacunar infarcts in the bilateral basal ganglia and right thalamus. However, the right thalamic infarct is new since 2024. Moderate chronic microvascular ischemic changes and generalized parenchymal volume loss, mild cerebellar volume loss, and brainstem atrophy were also noted and similar to her prior MRI. She has a low WBC of 3.2 with Stevens ANC 2200, low hemoglobin of 10.0 down from 11.1, and platelet count of 251,000. She has a severely low potassium of 2.4 but the rest of her CMP is normal. We discussed having her go to the emergency room but she declined. I ordered IV potassium 40 meq today but she left after  20 meq were administered. She will see her PCP tomorrow to follow-up on her many issues including the bleeding. I will taper her oxycodone  from 10mg  to 5mg  with her next prescription. Her CA 19-9 in June was elevated at 51 but improved from 63; her CA 19.9 today is pending.  I will see her back  in 1-2 months with CBC and CMP.  She denies fever, chills, night sweats, or other signs of infection. She denies cardiorespiratory and gastrointestinal issues. She  denies pain. Her appetite is poor and Her weight could not be weighed today as she is wheelchair bound and has difficulty ambulating.   REVIEW OF SYSTEMS:  Review of Systems  Constitutional:  Positive for appetite change (poor) and fatigue. Negative for chills, diaphoresis, fever and unexpected weight change.  HENT:  Negative.  Negative for lump/mass, mouth sores, sore throat and trouble swallowing.   Eyes: Negative.   Respiratory: Negative.  Negative for chest tightness, cough, hemoptysis, shortness of breath and wheezing.   Cardiovascular: Negative.  Negative for chest pain, leg swelling and palpitations.  Gastrointestinal:  Positive for abdominal pain (left side, 7/10) and nausea (severe). Negative for abdominal distention, blood in stool, constipation, diarrhea and vomiting.       Rectal bleeding  Endocrine: Negative.  Negative for hot flashes.  Genitourinary:  Positive for vaginal bleeding. Negative for difficulty urinating, dysuria, frequency and hematuria.   Musculoskeletal:  Positive for gait problem (since stroke, uses walker). Negative for arthralgias, back pain, flank pain, myalgias and neck pain.  Skin: Negative.  Negative for itching and rash.  Neurological:  Positive for extremity weakness (since stroke), gait problem (since stroke, uses walker) and speech difficulty (slurred speech). Negative for dizziness, headaches, light-headedness, numbness and seizures.  Hematological: Negative.  Negative for adenopathy. Does not bruise/bleed  easily.  Psychiatric/Behavioral: Negative.  Negative for depression and sleep disturbance. The patient is not nervous/anxious.     VITALS:  Blood pressure (!) 190/74, pulse 65, temperature 98.3 F (36.8 C), temperature source Oral, resp. rate 18, height 5' 4 (1.626 m), SpO2 100%.  Wt Readings from Last 3 Encounters:  04/17/24 130 lb (59 kg)  01/24/24 139 lb 8 oz (63.3 kg)  01/04/24 139 lb 6.4 oz (63.2 kg)    Body mass index is 23.95 kg/m.  Performance status (ECOG): 2 - Symptomatic, <50% confined to bed  PHYSICAL EXAM:  Physical Exam Vitals and nursing note reviewed.  Constitutional:      General: She is not in acute distress.    Appearance: She is ill-appearing (chronically ill appearing).  HENT:     Head: Normocephalic and atraumatic.     Right Ear: Tympanic membrane, ear canal and external ear normal.     Left Ear: Tympanic membrane, ear canal and external ear normal.     Nose: Nose normal.     Mouth/Throat:     Mouth: Mucous membranes are moist.     Pharynx: Oropharynx is clear. No oropharyngeal exudate or posterior oropharyngeal erythema.  Eyes:     General: No scleral icterus.    Extraocular Movements: Extraocular movements intact.     Conjunctiva/sclera: Conjunctivae normal.     Pupils: Pupils are equal, round, and reactive to light.  Cardiovascular:     Rate and Rhythm: Normal rate and regular rhythm.     Pulses: Normal pulses.     Heart sounds: Normal heart sounds. No murmur heard.    No friction rub. No gallop.  Pulmonary:     Effort: Pulmonary effort is normal.     Breath sounds: Normal breath sounds. No wheezing, rhonchi or rales.  Abdominal:     General: There is no distension.     Palpations: Abdomen is soft. There is no hepatomegaly, splenomegaly or mass.     Tenderness: There is no abdominal tenderness.  Musculoskeletal:  General: Normal range of motion.     Cervical back: Normal range of motion and neck supple. No tenderness.     Right lower  leg: No edema.     Left lower leg: No edema.  Lymphadenopathy:     Cervical: No cervical adenopathy.     Upper Body:     Right upper body: No supraclavicular or axillary adenopathy.     Left upper body: No supraclavicular or axillary adenopathy.     Lower Body: No right inguinal adenopathy. No left inguinal adenopathy.  Skin:    General: Skin is warm and dry.     Coloration: Skin is not jaundiced.     Findings: No rash.  Neurological:     Mental Status: She is oriented to person, place, and time. She is confused.     Cranial Nerves: No cranial nerve deficit.     Gait: Gait abnormal.     Comments: Right hemiparesis but good hand grip bilaterally  Mild confusion  Psychiatric:        Mood and Affect: Mood normal.        Speech: Speech is delayed and slurred.        Behavior: Behavior is slowed.        Thought Content: Thought content normal.        Cognition and Memory: Cognition is impaired. Memory is impaired.        Judgment: Judgment is inappropriate.     LABS:      Latest Ref Rng & Units 04/16/2024   10:10 AM 01/04/2024    1:10 PM 09/06/2023    1:39 PM  CBC  WBC 4.0 - 10.5 K/uL 3.2  3.4  3.1   Hemoglobin 12.0 - 15.0 g/dL 89.9  88.8  89.3   Hematocrit 36.0 - 46.0 % 29.1  32.6  31.0   Platelets 150 - 400 K/uL 251  241  283       Latest Ref Rng & Units 04/16/2024   10:10 AM 01/04/2024    1:10 PM 09/06/2023    1:39 PM  CMP  Glucose 70 - 99 mg/dL 843  892  98   BUN 6 - 20 mg/dL 5  12  8    Creatinine 0.44 - 1.00 mg/dL 9.19  9.17  9.19   Sodium 135 - 145 mmol/L 144  143  142   Potassium 3.5 - 5.1 mmol/L 2.4  3.6  3.7   Chloride 98 - 111 mmol/L 100  106  107   CO2 22 - 32 mmol/L 32  26  27   Calcium  8.9 - 10.3 mg/dL 9.3  89.9  9.6   Total Protein 6.5 - 8.1 g/dL 6.6  7.1  7.0   Total Bilirubin 0.0 - 1.2 mg/dL 0.8  0.4  0.3   Alkaline Phos 38 - 126 U/L 81  94  91   AST 15 - 41 U/L 21  20  23    ALT 0 - 44 U/L 22  20  18     Lab Results  Component Value Date   TSH 3.380  08/08/2022   Lab Results  Component Value Date   CEA1 1.9 11/16/2022   /  CEA  Date Value Ref Range Status  11/16/2022 1.9 0.0 - 4.7 ng/mL Final    Comment:    (NOTE)  Nonsmokers          <3.9                             Smokers             <5.6 [employer] Electrochemiluminescence Immunoassay (ECLIA) Values obtained with different assay methods or kits cannot be used interchangeably.  Results cannot be interpreted as absolute evidence of the presence or absence of malignant disease. Performed At: [facility] [address] [place], [place] [id number] Jennette Shorter MD Ey:[id number]    No results found for: PSA1 Lab Results  Component Value Date   RJW800 58 (H) 04/16/2024   Lab Results  Component Value Date   TIBC 358 01/04/2024   TIBC 259 08/08/2022   TIBC 301 11/09/2021   FERRITIN 58 01/04/2024   FERRITIN 134 08/08/2022   FERRITIN 59 11/09/2021   IRONPCTSAT 22 01/04/2024   IRONPCTSAT 42 08/08/2022   IRONPCTSAT 26 11/09/2021     STUDIES:  CT HEAD WO CONTRAST ( ) Result Date: 04/11/2024 EXAM: CT HEAD WITHOUT CONTRAST 04/03/2024 12:25:09 PM TECHNIQUE: CT of the head was performed without the administration of intravenous contrast. Automated exposure control, iterative reconstruction, and/or weight based adjustment of the mA/kV was utilized to reduce the radiation dose to as low as reasonably achievable. COMPARISON: MRI head 12/20/2022 and CT head 12/08/2022 CLINICAL HISTORY: RT hemiparesis FINDINGS: BRAIN AND VENTRICLES: Nonspecific hypoattenuation in the periventricular and subcortical white matter, most likely representing chronic microvascular ischemic changes. Similar generalized parenchymal volume loss. Mild cerebellar volume loss. Redemonstrated generalized brainstem atrophy similar to prior MRI. Remote lacunar infarcts in the bilateral basal ganglia and in the right thalamus. Right thalamic infarct appears new since the  prior studies. ORBITS: No acute abnormality. SINUSES: No acute abnormality. SOFT TISSUES AND SKULL: No acute soft tissue abnormality. No skull fracture. IMPRESSION: 1. No acute intracranial abnormality. 2. Remote lacunar infarcts in the bilateral basal ganglia and right thalamus. Right thalamic infarct is new since 2024. 3. Moderate chronic microvascular ischemic changes. 4. Generalized parenchymal volume loss, mild cerebellar volume loss, and brainstem atrophy, similar to prior MRI. Electronically signed by: Donnice Mania MD 04/11/2024 09:23 PM EDT RP Workstation: [id number]   EXAM: 01/04/2024 DIGITAL SCREENING BILATERAL MAMMOGRAM WITH TOMOSYNTHESIS AND CAD IMPRESSION: No mammographic evidence of malignancy.  EXAM: 01/04/2024 MRI ABDOMEN WITHOUT AND WITH CONTRAST (INCLUDING MRCP) IMPRESSION: 1. No significant change in a lobulated, thinly septated cystic lesion in the central pancreatic head, measuring 4.5 x 3.7 cm. No solid component or suspicious contrast enhancement. No pancreatic ductal dilatation or surrounding inflammatory changes. 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 As previously reported, consider EUS/FNA for definitive diagnosis if not previously performed, and recommend ongoing imaging surveillance given size. 2. Status post cholecystectomy. Unchanged postoperative biliary ductal dilatation. HISTORY:   Past Medical History:  Diagnosis Date   Acid reflux    Anxiety    Carpal tunnel syndrome    Cerebrovascular disease    Diabetic retinopathy (HCC)    Family history of pancreatic cancer 08/09/2021   Gait abnormality    History of stroke    Hyperlipidemia    Hypertension    Migraine    Pancreatic cyst 08/09/2021   Retinal detachment    Thrombocytosis    Trigger finger    Ulnar nerve entrapment     Past Surgical History:  Procedure Laterality Date   BIOPSY  05/11/2023  Procedure: BIOPSY;  Surgeon: Wilhelmenia Aloha Raddle., MD;  Location: [facility];  Service: Gastroenterology;;   CESAREAN SECTION  1997   ESOPHAGOGASTRODUODENOSCOPY N/A 05/11/2023   Procedure: ESOPHAGOGASTRODUODENOSCOPY (EGD);  Surgeon: Wilhelmenia Aloha Raddle., MD;  Location: [facility];  Service: Gastroenterology;  Laterality: N/A;   EUS N/A 05/11/2023   Procedure: UPPER ENDOSCOPIC ULTRASOUND (EUS) RADIAL;  Surgeon: Wilhelmenia Aloha Raddle., MD;  Location: [facility];  Service: Gastroenterology;  Laterality: N/A;   FINE NEEDLE ASPIRATION N/A 05/11/2023   Procedure: FINE NEEDLE ASPIRATION (FNA) LINEAR;  Surgeon: Wilhelmenia Aloha Raddle., MD;  Location: [facility];  Service: Gastroenterology;  Laterality: N/A;   GALLBLADDER SURGERY  2011    Family History  Problem Relation Age of Onset   Diabetes Mother    Cancer Mother    Pancreatic cancer Father    Pancreatic cancer Brother    Pancreatic cancer Brother    Healthy Child    Pancreatic cancer Paternal Uncle     Social History:  reports that she has never smoked. She has never used smokeless tobacco. She reports that she does not drink alcohol  and does not use drugs.The patient is alone today.  Allergies:  Allergies  Allergen Reactions   Iodinated Contrast Media Other (See Comments) and Shortness Of Breath   Morphine  Itching and Other (See Comments)    Makes me feel out of my mind per pt.    Current Medications: Current Outpatient Medications  Medication Sig Dispense Refill   ALPRAZolam  (XANAX ) 0.5 MG tablet Take 1 tablet (0.5 mg total) by mouth every 8 (eight) hours as needed for anxiety. 90 tablet 2   amLODipine  (NORVASC ) 10 MG tablet Take 1 tablet (10 mg total) by mouth daily. 90 tablet 1   BD PEN NEEDLE NANO 2ND GEN 32G X 4 MM MISC CHECK BLOOD SUGAR THREE TIMES A DAY 100 each 3   clopidogrel  (PLAVIX ) 75 MG tablet Take 1 tablet (75 mg total) by mouth daily. 90 tablet 1   fluticasone  (FLONASE ) 50 MCG/ACT nasal spray PLACE 1 SPRAY INTO BOTH NOSTRILS IN THE  MORNING AND AT BEDTIME. 48 mL 1   fluticasone  (FLOVENT  HFA) 44 MCG/ACT inhaler      hydrochlorothiazide  (HYDRODIURIL ) 25 MG tablet Take 1 tablet (25 mg total) by mouth daily. 90 tablet 1   insulin  aspart (NOVOLOG  FLEXPEN) 100 UNIT/ML FlexPen Inject 35 Units into the skin 3 (three) times daily with meals. 15 mL 11   lisinopril  (ZESTRIL ) 20 MG tablet Take 40 mg by mouth.     lisinopril  (ZESTRIL ) 40 MG tablet Take 1 tablet (40 mg total) by mouth daily. 90 tablet 1   metFORMIN  (GLUCOPHAGE ) 1000 MG tablet TAKE 1 TABLET (1,000 MG TOTAL) BY MOUTH TWICE A DAY WITH FOOD 180 tablet 0   naloxone  (NARCAN ) nasal spray 4 mg/0.1 mL As directed 2 each 1   ondansetron  (ZOFRAN ) 4 MG tablet Take 1 tablet (4 mg total) by mouth every 8 (eight) hours as needed for nausea or vomiting. 20 tablet 0   Oxycodone  HCl 10 MG TABS Take 1 tablet (10 mg total) by mouth every 4 (four) hours as needed. 120 tablet 0   Potassium Chloride  ER 20 MEQ TBCR Take 1 tablet (20 mEq total) by mouth in the morning, at noon, and at bedtime. 270 tablet 2   prochlorperazine (COMPAZINE) 10 MG tablet Take 10 mg by mouth every 6 (six) hours as needed for nausea or vomiting.     RABEprazole  (ACIPHEX ) 20 MG tablet Take 1 tablet (  20 mg total) by mouth daily. 90 tablet 0   rosuvastatin  (CRESTOR ) 20 MG tablet Take 1 tablet (20 mg total) by mouth daily. 30 tablet 11   TOUJEO  MAX SOLOSTAR 300 UNIT/ML Solostar Pen Inject 70 Units into the skin daily. SMARTSIG:80 Unit(s) SUB-Q Every Night 7 mL 2   No current facility-administered medications for this visit.    I,Jasmine M Lassiter,acting as a scribe for [name], MD.,have documented all relevant documentation on the behalf of [name], MD,as directed by  [name], MD while in the presence of [name], MD.

## 2024-04-15 NOTE — Telephone Encounter (Signed)
 Patient is calling back concerning her results states she had it done last week and no one is calling her back.  Phone #: [phone]

## 2024-04-15 NOTE — Telephone Encounter (Signed)
 Patient states she is calling for MRI results.   Ph. [phone]

## 2024-04-16 ENCOUNTER — Inpatient Hospital Stay: Attending: Oncology

## 2024-04-16 ENCOUNTER — Inpatient Hospital Stay (HOSPITAL_BASED_OUTPATIENT_CLINIC_OR_DEPARTMENT_OTHER): Admitting: Oncology

## 2024-04-16 ENCOUNTER — Inpatient Hospital Stay

## 2024-04-16 ENCOUNTER — Other Ambulatory Visit: Payer: Self-pay | Admitting: Oncology

## 2024-04-16 ENCOUNTER — Telehealth: Payer: Self-pay

## 2024-04-16 ENCOUNTER — Other Ambulatory Visit: Payer: Self-pay | Admitting: Pharmacist

## 2024-04-16 VITALS — BP 187/88 | HR 70

## 2024-04-16 VITALS — BP 190/74 | HR 65 | Temp 98.3°F | Resp 18 | Ht 64.0 in

## 2024-04-16 DIAGNOSIS — R161 Splenomegaly, not elsewhere classified: Secondary | ICD-10-CM | POA: Insufficient documentation

## 2024-04-16 DIAGNOSIS — R4781 Slurred speech: Secondary | ICD-10-CM | POA: Diagnosis not present

## 2024-04-16 DIAGNOSIS — E876 Hypokalemia: Secondary | ICD-10-CM

## 2024-04-16 DIAGNOSIS — D701 Agranulocytosis secondary to cancer chemotherapy: Secondary | ICD-10-CM | POA: Insufficient documentation

## 2024-04-16 DIAGNOSIS — D473 Essential (hemorrhagic) thrombocythemia: Secondary | ICD-10-CM

## 2024-04-16 DIAGNOSIS — Z8673 Personal history of transient ischemic attack (TIA), and cerebral infarction without residual deficits: Secondary | ICD-10-CM | POA: Diagnosis not present

## 2024-04-16 DIAGNOSIS — Z800 Family history of malignant neoplasm of digestive organs: Secondary | ICD-10-CM | POA: Diagnosis not present

## 2024-04-16 DIAGNOSIS — Z79899 Other long term (current) drug therapy: Secondary | ICD-10-CM | POA: Insufficient documentation

## 2024-04-16 DIAGNOSIS — E11319 Type 2 diabetes mellitus with unspecified diabetic retinopathy without macular edema: Secondary | ICD-10-CM | POA: Insufficient documentation

## 2024-04-16 DIAGNOSIS — K222 Esophageal obstruction: Secondary | ICD-10-CM | POA: Insufficient documentation

## 2024-04-16 DIAGNOSIS — R1084 Generalized abdominal pain: Secondary | ICD-10-CM | POA: Diagnosis not present

## 2024-04-16 DIAGNOSIS — Z794 Long term (current) use of insulin: Secondary | ICD-10-CM | POA: Insufficient documentation

## 2024-04-16 DIAGNOSIS — E785 Hyperlipidemia, unspecified: Secondary | ICD-10-CM | POA: Diagnosis not present

## 2024-04-16 DIAGNOSIS — I10 Essential (primary) hypertension: Secondary | ICD-10-CM | POA: Insufficient documentation

## 2024-04-16 DIAGNOSIS — D649 Anemia, unspecified: Secondary | ICD-10-CM | POA: Insufficient documentation

## 2024-04-16 DIAGNOSIS — G894 Chronic pain syndrome: Secondary | ICD-10-CM | POA: Diagnosis not present

## 2024-04-16 DIAGNOSIS — M85859 Other specified disorders of bone density and structure, unspecified thigh: Secondary | ICD-10-CM | POA: Diagnosis not present

## 2024-04-16 DIAGNOSIS — K862 Cyst of pancreas: Secondary | ICD-10-CM | POA: Insufficient documentation

## 2024-04-16 DIAGNOSIS — R978 Other abnormal tumor markers: Secondary | ICD-10-CM | POA: Insufficient documentation

## 2024-04-16 LAB — CMP (CANCER CENTER ONLY)
ALT: 22 U/L (ref 0–44)
AST: 21 U/L (ref 15–41)
Albumin: 3.8 g/dL (ref 3.5–5.0)
Alkaline Phosphatase: 81 U/L (ref 38–126)
Anion gap: 13 (ref 5–15)
BUN: 5 mg/dL — ABNORMAL LOW (ref 6–20)
CO2: 32 mmol/L (ref 22–32)
Calcium: 9.3 mg/dL (ref 8.9–10.3)
Chloride: 100 mmol/L (ref 98–111)
Creatinine: 0.8 mg/dL (ref 0.44–1.00)
GFR, Estimated: >60 mL/min (ref >60)
Glucose, Bld: 156 mg/dL — ABNORMAL HIGH (ref 70–99)
Potassium: 2.4 mmol/L — CL (ref 3.5–5.1)
Sodium: 144 mmol/L (ref 135–145)
Total Bilirubin: 0.8 mg/dL (ref 0.0–1.2)
Total Protein: 6.6 g/dL (ref 6.5–8.1)

## 2024-04-16 LAB — CBC WITH DIFFERENTIAL (CANCER CENTER ONLY)
Abs Immature Granulocytes: 0.01 K/uL (ref 0.00–0.07)
Basophils Absolute: 0 K/uL (ref 0.0–0.1)
Basophils Relative: 0 %
Eosinophils Absolute: 0.1 K/uL (ref 0.0–0.5)
Eosinophils Relative: 2 %
HCT: 29.1 % — ABNORMAL LOW (ref 36.0–46.0)
Hemoglobin: 10 g/dL — ABNORMAL LOW (ref 12.0–15.0)
Immature Granulocytes: 0 %
Lymphocytes Relative: 24 %
Lymphs Abs: 0.8 K/uL (ref 0.7–4.0)
MCH: 31.5 pg (ref 26.0–34.0)
MCHC: 34.4 g/dL (ref 30.0–36.0)
MCV: 91.8 fL (ref 80.0–100.0)
Monocytes Absolute: 0.2 K/uL (ref 0.1–1.0)
Monocytes Relative: 5 %
Neutro Abs: 2.2 K/uL (ref 1.7–7.7)
Neutrophils Relative %: 69 %
Platelet Count: 251 K/uL (ref 150–400)
RBC: 3.17 MIL/uL — ABNORMAL LOW (ref 3.87–5.11)
RDW: 14.8 % (ref 11.5–15.5)
WBC Count: 3.2 K/uL — ABNORMAL LOW (ref 4.0–10.5)
nRBC: 0 % (ref 0.0–0.2)

## 2024-04-16 LAB — VITAMIN B12: Vitamin B-12: 337 pg/mL (ref 180–914)

## 2024-04-16 MED ORDER — POTASSIUM CHLORIDE 10 MEQ/100ML IV SOLN
10.0000 meq | INTRAVENOUS | Status: AC
  Administered 2024-04-16 (×2): 10 meq via INTRAVENOUS
  Filled 2024-04-16 (×4): qty 100

## 2024-04-16 MED ORDER — SODIUM CHLORIDE 0.9 % IV SOLN: INTRAVENOUS | Status: DC

## 2024-04-16 NOTE — Progress Notes (Signed)
 Pt was to receive 4 runs of 10 meq potassium today.  Pt actually received 2 runs of 10 meq. At that point, pt refused further treatment, insisted her IV be removed and wanted to go home.  Pt tearful and crying.  States she  wants to go home, worried about husband's scheduled dialysis today.  Husband and son present and agree to pt going home, IV taken out and dressed.  Pt states she does not want her schedule because she does not want to come back. Dr Cornelius notified.  No further orders given.  Pt calmer when leaving with her husband and son.

## 2024-04-16 NOTE — Patient Instructions (Signed)
 Hypokalemia Hypokalemia means that the amount of potassium in the blood is lower than normal. Potassium is a mineral (electrolyte) that helps regulate the amount of fluid in the body. It also stimulates muscle tightening (contraction) and helps nerves work properly. Normally, most of the body's potassium is inside cells, and only a very small amount is in the blood. Because the amount in the blood is so small, minor changes to potassium levels in the blood can be life-threatening. What are the causes? This condition may be caused by: Antibiotic medicine. Diarrhea or vomiting. Taking too much of a medicine that helps you have a bowel movement (laxative) can cause diarrhea and lead to hypokalemia. Chronic kidney disease (CKD). Medicines that help the body get rid of excess fluid (diuretics). Eating disorders, such as anorexia or bulimia. Low magnesium levels in the body. Sweating a lot. What are the signs or symptoms? Symptoms of this condition include: Weakness. Constipation. Fatigue. Muscle cramps. Mental confusion. Skipped heartbeats or irregular heartbeat (palpitations). Tingling or numbness. How is this diagnosed? This condition is diagnosed with a blood test. How is this treated? This condition may be treated by: Taking potassium supplements. Adjusting the medicines that you take. Eating more foods that contain a lot of potassium. If your potassium level is very low, you may need to get potassium through an IV and be monitored in the hospital. Follow these instructions at home: Eating and drinking  Eat a healthy diet. A healthy diet includes fresh fruits and vegetables, whole grains, healthy fats, and lean proteins. If told, eat more foods that contain a lot of potassium. These include: Nuts, such as peanuts and pistachios. Seeds, such as sunflower seeds and pumpkin seeds. Peas, lentils, and lima beans. Whole grain and bran cereals and breads. Fresh fruits and vegetables,  such as apricots, avocado, bananas, cantaloupe, kiwi, oranges, tomatoes, asparagus, and potatoes. Juices, such as orange, tomato, and prune. Lean meats, including fish. Milk and milk products, such as yogurt. General instructions Take over-the-counter and prescription medicines only as told by your health care provider. This includes vitamins, natural food products, and supplements. Keep all follow-up visits. This is important. Contact a health care provider if: You have weakness that gets worse. You feel your heart pounding or racing. You vomit. You have diarrhea. You have diabetes and you have trouble keeping your blood sugar in your target range. Get help right away if: You have chest pain. You have shortness of breath. You have vomiting or diarrhea that lasts for more than 2 days. You faint. These symptoms may be an emergency. Get help right away. Call 911. Do not wait to see if the symptoms will go away. Do not drive yourself to the hospital. Summary Hypokalemia means that the amount of potassium in the blood is lower than normal. This condition is diagnosed with a blood test. Hypokalemia may be treated by taking potassium supplements, adjusting the medicines that you take, or eating more foods that are high in potassium. If your potassium level is very low, you may need to get potassium through an IV and be monitored in the hospital. This information is not intended to replace advice given to you by your health care provider. Make sure you discuss any questions you have with your health care provider. Document Revised: 04/01/2021 Document Reviewed: 04/01/2021 Elsevier Patient Education  2024 [employer].

## 2024-04-16 NOTE — Telephone Encounter (Signed)
 CRITICAL VALUE STICKER  CRITICAL VALUE: Potassium 2.4  RECEIVER (on-site recipient of call): Jon, LPN  DATE & TIME NOTIFIED: 04/16/24 at 1109  MESSENGER (representative from lab): Gordy at [facility] lab  MD NOTIFIED: Dr. Cornelius aware  TIME OF NOTIFICATION: 1111  RESPONSE:  Will arrange for potassium infusion after MD visit.

## 2024-04-17 ENCOUNTER — Ambulatory Visit: Admitting: Internal Medicine

## 2024-04-17 ENCOUNTER — Encounter: Payer: Self-pay | Admitting: Internal Medicine

## 2024-04-17 ENCOUNTER — Other Ambulatory Visit: Payer: Self-pay | Admitting: Internal Medicine

## 2024-04-17 VITALS — BP 178/70 | HR 61 | Temp 98.2°F | Resp 16 | Ht 64.0 in | Wt 130.0 lb

## 2024-04-17 DIAGNOSIS — E1165 Type 2 diabetes mellitus with hyperglycemia: Secondary | ICD-10-CM

## 2024-04-17 DIAGNOSIS — Z8673 Personal history of transient ischemic attack (TIA), and cerebral infarction without residual deficits: Secondary | ICD-10-CM | POA: Diagnosis not present

## 2024-04-17 DIAGNOSIS — I10 Essential (primary) hypertension: Secondary | ICD-10-CM

## 2024-04-17 DIAGNOSIS — E782 Mixed hyperlipidemia: Secondary | ICD-10-CM

## 2024-04-17 DIAGNOSIS — R296 Repeated falls: Secondary | ICD-10-CM | POA: Diagnosis not present

## 2024-04-17 DIAGNOSIS — E876 Hypokalemia: Secondary | ICD-10-CM

## 2024-04-17 DIAGNOSIS — E7800 Pure hypercholesterolemia, unspecified: Secondary | ICD-10-CM

## 2024-04-17 LAB — CANCER ANTIGEN 19-9: CA 19-9: 58 U/mL — ABNORMAL HIGH (ref 0–35)

## 2024-04-17 MED ORDER — CLOPIDOGREL BISULFATE 75 MG PO TABS: 75.0000 mg | ORAL_TABLET | Freq: Every day | ORAL | 1 refills | Status: AC

## 2024-04-17 MED ORDER — NOVOLOG FLEXPEN 100 UNIT/ML ~~LOC~~ SOPN: 35.0000 [IU] | PEN_INJECTOR | Freq: Three times a day (TID) | SUBCUTANEOUS | 11 refills | Status: AC

## 2024-04-17 MED ORDER — POTASSIUM CHLORIDE ER 20 MEQ PO TBCR: 1.0000 | EXTENDED_RELEASE_TABLET | Freq: Three times a day (TID) | ORAL | 2 refills | Status: DC

## 2024-04-17 MED ORDER — ROSUVASTATIN CALCIUM 20 MG PO TABS: 20.0000 mg | ORAL_TABLET | Freq: Every day | ORAL | 11 refills | Status: DC

## 2024-04-17 MED ORDER — AMLODIPINE BESYLATE 10 MG PO TABS: 10.0000 mg | ORAL_TABLET | Freq: Every day | ORAL | 1 refills | Status: AC

## 2024-04-17 MED ORDER — TOUJEO MAX SOLOSTAR 300 UNIT/ML ~~LOC~~ SOPN: 70.0000 [IU] | PEN_INJECTOR | Freq: Every day | SUBCUTANEOUS | 2 refills | Status: DC

## 2024-04-17 MED ORDER — LISINOPRIL 40 MG PO TABS: 40.0000 mg | ORAL_TABLET | Freq: Every day | ORAL | 1 refills | Status: AC

## 2024-04-17 MED ORDER — HYDROCHLOROTHIAZIDE 25 MG PO TABS: 25.0000 mg | ORAL_TABLET | Freq: Every day | ORAL | 1 refills | Status: AC

## 2024-04-17 NOTE — Progress Notes (Signed)
 Office Visit  Subjective   Patient ID: Allison Stevens   DOB: 1965-12-10   Age: 58 y.o.   MRN: [id number]   Chief Complaint Chief Complaint  Patient presents with   Hypertension    Pt reports she has been out of her BP medications for 2-3 weeks.      History of Present Illness The patient returns today for a number of reasons.  She saw neurology in August and she has been diagnosed with another recurrent stroke.  She saw Dr. Dietrich yesterday who called me and told me that on routine lab work, her K+ was 2.4.  They did an IV potassium infusion yesterday where they ordered but she only stayed to .  They also noted that her BP was elevated to 190/74.  Dr. Dietrich asked her to come see me today.  Dr. Dietrich tried to get her to go to the ER yesterday but the patient outright refused.  The patient also had a recurrent stroke when she came to see me in 11/2022.  She saw me on 12/08/2022 where she came in for an acute visit with trouble with walking which started 2-3 weeks prior.  She has had had multiple strokes related to her thrombocytosis where she has residual sequalae of left sided weakness but was able to use that side.  She has not been following with Neurology.She stated that she began having worsening weakness of her left side of both her arm and her leg that started 2-3 weeks prior.  There was no weakness of her right side.  She actually had about 4 falls with abrasions to her knees. The patient denied any dizziness but did complain of worsening left sided weakness where she had to hold on to things to walk and she began using a rolling walker.  She did follow with Hematology with Dr. Cornelius where she last saw her in 10/2022 where her thrombocytosis was controlled and she was noted to have a platelet count on 11/21/2022 of 272.  She is currently treated with hydroxyruea.  The patient was diagnosed with thrombocytosis in 2005.  This new weakness was not effecting her face,  speech, vision or memory.  I did a CT scan of her head on 12/08/2022 and there was no acute intracranial abnormality.  There was chronic microvascular disease and brain atrophy and remote lacunar infarcts within her bilateral basal ganglia.  We therefore did a MRI of brain which was scheduled on 12/20/2022 and this showed a punctate acute to subacute lacunar infarct in the posterior left lentiform.  There was no associated hemorrhage or mass effect.  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  We did get therapies involved at that time.    She was having problems with balance and falls at time.  I did increase her lipitor from 40mg  to 80mg  at bedtime and she was continued on ASA 325mg  daily and plavix  75mg  daily.  The patient was supposed to come in for followup but we have had problems with compliance and she has not been seen in 6 months by myself when I saw her earlier in the year.  We did refer her to neurology who saw her on 03/06/2024.  They noted in their notes that they were going to order a CT of her head.  They wanted to continue ASA for stroke prevention and pending repeat CT consider  transition to plavix  if new stroke (she is already supposed to be on plavix ).  They repeated her LDL on 03/06/2024 and her LDL 61 with goal to be less than <70.  Her BP was noted to be elevated and neurology increased her hydrochlorothiazide  from 12.5mg  to 25mg  daily at that time.  They referred her for homehealth therapies.  She states homehealth never came to the house.   Neurology did a CT scan of her head on 04/03/2024 and this showed no acute intracranial abnormality.  There was remote lacunar infarcts in the bilateral basal ganglia and right thalamus.  The right thalamic infarct is new since 2024.  Moderate chronic microvascular ischemic changes.  Generalized parenchymal volume loss, mild cerebellar volume loss, and brainstem  atrophy, similar to prior MRI.  The patient again tellls me she ran out of plavix  3-4 weeks ago.  She has had falls and has difficulty with ambulation where she states someone has to hold her hand in order for her to ambulate.  She is having right sided weakness that started a month ago.  The patient is a 58 year old African American/Black female who returns for a follow-up visit for her T2 diabetes.  This past year, her A1c was elevated and I tried to refer her to endocrinology.  I am told by my referral clerk that none of the endocrinologists in the area will take her due to her having appointments in the past and she would no show (noncompliance).  She was on Trulicity 4.5mg  subcut once a week but stopped it in January as she was getting nausea and vomiting from this medication.  She is currently on:  Toujeo  70 Units daily (she was on 80 Units before) but she states her insurance stopped paying for this and she has not taken it for a long time.   She is also on Novolog  35 Units subcut usually takes 3 times a day with meals and metformin  1000mg  BID.  She is not walking as much as they would like.  She specifically denies shortness of breath, unexplained fatigue, palpitations or racing heartbeats, syncopal episodes, and unexplained abdominal pain, nausea or vomiting.  She denies any hypoglycemia at this time.  She is now checking her blood sugars every day and they range 100-200.  She came in fasting today in anticipation of lab work. Her ast HgbA1c was done 6 months ago and was 9.6%.  She has complications with diabetes with a history of diabetic retinopathy.  There are no complications of diabetic neuropathy, nephropathy or vascular disease.  Her last dilated eye exam was done on 02/03/2022 that showed mild diabetic retinopathy with mild paramacular edema in her eyes.  She has an appointment with her eye doctor in 01/2024.    The patient is a 58 year old African American/Black female who presents for a  follow-up evaluation of hypertension.   Today, she states she ran out of lisinopril  and amlodipine  for about 6 months ago.  The patient has been checking her blood pressure at home. The patient's blood pressure has ranged systollically from 170's.  The patient's current medications include: amlodpine 10mg  daily, lisinopril  40mg  daily, and hydrochlorothiazide  25 mg daily.   She was on coreg  12.5mg  BID last year but for some reason she is no longer on this.  The patient has been tolerating her medications well. The patient denies any visual changes, lightheadness, shortness of breath, orthopnea, and weakness/numbness. She reports there have been no other symptoms noted.  Allison Stevens returns today for routine followup on her cholesterol. On her last visit, her cholesterol was not controlled with her history of stroke.  We changed her atorvastatin  to crestor  this past year.  Her goal LDL <50 with her history of stroke and diabetes.  Overall, she states she is doing well and is without any complaints or problems at this time. She specifically denies chest pain, abdominal pain, nausea, diarrhea, and myalgias. She remains on dietary management as well as the following cholesterol lowering medications crestor  20 mg oral tablet qhs. She is fasting in anticipation for labs today.  She states she ran out of crestor  2 weeks ago.     Past Medical History Past Medical History:  Diagnosis Date   Acid reflux    Anxiety    Carpal tunnel syndrome    Cerebrovascular disease    Diabetic retinopathy (HCC)    Family history of pancreatic cancer 08/09/2021   Gait abnormality    History of stroke    Hyperlipidemia    Hypertension    Migraine    Pancreatic cyst 08/09/2021   Retinal detachment    Thrombocytosis    Trigger finger    Ulnar nerve entrapment      Allergies Allergies  Allergen Reactions   Iodinated Contrast Media Other (See Comments) and Shortness Of Breath   Morphine  Itching and Other (See  Comments)    Makes me feel out of my mind per pt.     Medications  Current Outpatient Medications:    ALPRAZolam  (XANAX ) 0.5 MG tablet, Take 1 tablet (0.5 mg total) by mouth every 8 (eight) hours as needed for anxiety., Disp: 90 tablet, Rfl: 2   amLODipine  (NORVASC ) 10 MG tablet, TAKE 1 TABLET BY MOUTH EVERY DAY, Disp: 90 tablet, Rfl: 0   BD PEN NEEDLE NANO 2ND GEN 32G X 4 MM MISC, CHECK BLOOD SUGAR THREE TIMES A DAY, Disp: 100 each, Rfl: 3   clopidogrel  (PLAVIX ) 75 MG tablet, Take 1 tablet (75 mg total) by mouth daily., Disp: , Rfl:    fluticasone  (FLONASE ) 50 MCG/ACT nasal spray, PLACE 1 SPRAY INTO BOTH NOSTRILS IN THE MORNING AND AT BEDTIME., Disp: 48 mL, Rfl: 1   fluticasone  (FLOVENT  HFA) 44 MCG/ACT inhaler, , Disp: , Rfl:    insulin  aspart (NOVOLOG  FLEXPEN) 100 UNIT/ML FlexPen, Inject 35 Units into the skin 3 (three) times daily with meals., Disp: 15 mL, Rfl: 11   lisinopril  (ZESTRIL ) 20 MG tablet, Take 40 mg by mouth., Disp: , Rfl:    metFORMIN  (GLUCOPHAGE ) 1000 MG tablet, TAKE 1 TABLET (1,000 MG TOTAL) BY MOUTH TWICE A DAY WITH FOOD, Disp: 180 tablet, Rfl: 0   naloxone  (NARCAN ) nasal spray 4 mg/0.1 mL, As directed, Disp: 2 each, Rfl: 1   ondansetron  (ZOFRAN ) 4 MG tablet, Take 1 tablet (4 mg total) by mouth every 8 (eight) hours as needed for nausea or vomiting., Disp: 20 tablet, Rfl: 0   Oxycodone  HCl 10 MG TABS, Take 1 tablet (10 mg total) by mouth every 4 (four) hours as needed., Disp: 120 tablet, Rfl: 0   prochlorperazine (COMPAZINE) 10 MG tablet, Take 10 mg by mouth every 6 (six) hours as needed for nausea or vomiting., Disp: , Rfl:    RABEprazole  (ACIPHEX ) 20 MG tablet, Take 1 tablet (20 mg total) by mouth daily., Disp: 90 tablet, Rfl: 0   rosuvastatin  (CRESTOR ) 20 MG tablet, Take 1 tablet (20 mg total) by mouth daily., Disp: 30 tablet, Rfl: 11  TOUJEO  MAX SOLOSTAR 300 UNIT/ML Solostar Pen, Inject 70 Units into the skin daily. SMARTSIG:80 Unit(s) SUB-Q Every Night, Disp: 7 mL,  Rfl: 2   Potassium Chloride  ER 20 MEQ TBCR, TAKE 1 TABLET (20 MEQ TOTAL) BY MOUTH IN THE MORNING, AT NOON, AND AT BEDTIME. (Patient not taking: Reported on 04/17/2024), Disp: 270 tablet, Rfl: 2   Review of Systems Review of Systems  Constitutional:  Negative for chills, fever and malaise/fatigue.  Eyes:  Negative for blurred vision and double vision.  Respiratory:  Negative for cough and shortness of breath.   Cardiovascular:  Negative for chest pain, palpitations and leg swelling.  Gastrointestinal:  Negative for abdominal pain, constipation, diarrhea, nausea and vomiting.  Genitourinary:  Negative for frequency.  Musculoskeletal:  Negative for myalgias.  Skin:  Negative for itching and rash.  Neurological:  Negative for dizziness, weakness and headaches.  Endo/Heme/Allergies:  Negative for polydipsia.       Objective:    Vitals BP (!) 178/70   Pulse 61   Temp 98.2 F (36.8 C) (Temporal)   Resp 16   Ht 5' 4 (1.626 m)   Wt 130 lb (59 kg)   SpO2 98%   BMI 22.31 kg/m    Physical Examination Physical Exam Constitutional:      Appearance: Normal appearance. She is not ill-appearing.  Cardiovascular:     Rate and Rhythm: Normal rate and regular rhythm.     Pulses: Normal pulses.     Heart sounds: No murmur heard.    No friction rub. No gallop.  Pulmonary:     Effort: Pulmonary effort is normal. No respiratory distress.     Breath sounds: No wheezing, rhonchi or rales.  Abdominal:     General: Bowel sounds are normal. There is no distension.     Palpations: Abdomen is soft.     Tenderness: There is no abdominal tenderness.  Musculoskeletal:     Right lower leg: No edema.     Left lower leg: No edema.  Skin:    General: Skin is warm and dry.     Findings: No rash.  Neurological:     Mental Status: She is alert and oriented to person, place, and time.     Comments: CN II-XII are fully intact.  Strength is 4+/5 on the left side and 3+/5 on the right side.  Psychiatric:         Mood and Affect: Mood normal.        Behavior: Behavior normal.        Assessment & Plan:   Essential hypertension She has been noncompliant in coming to the office and noncompliant with her meds.  Her BP is elevated as she has not been on lisinopril  or amlodipine .  We will refill these meds today.  Inadequately controlled diabetes mellitus (HCC) She has not been taking her toujeo  in a while.  We will check her hgBa1c today.  We will restart her back on this medication.  Hypokalemia She was given K IV of 20mEq yesterday.  I am going to restart her back on her potassium supplement of 20mEq po TID. She is to start this ASAP and come back on Friday for a repeat BMP.  Hyperlipidemia Her LDL was under control when neurology checked it.  She however ran out of her crestor  (she is not on lipitor) which we will refill today.  History of stroke She has had a new stroke and has a history of recurrent strokes.  Her  recent CT head was discussed with her.  We will continue with secondary prevention.  Falls She states she never heard from homehealth therapy when she saw neurology.  We will get therapies at home.  She can not walk and she has had falls.    Return in about 4 weeks (around 05/15/2024).   Selinda Fleeta Finger, MD

## 2024-04-17 NOTE — Assessment & Plan Note (Signed)
 She states she never heard from homehealth therapy when she saw neurology.  We will get therapies at home.  She can not walk and she has had falls.

## 2024-04-17 NOTE — Assessment & Plan Note (Signed)
 She has had a new stroke and has a history of recurrent strokes.  Her recent CT head was discussed with her.  We will continue with secondary prevention.

## 2024-04-17 NOTE — Assessment & Plan Note (Signed)
 She has not been taking her toujeo  in a while.  We will check her hgBa1c today.  We will restart her back on this medication.

## 2024-04-17 NOTE — Assessment & Plan Note (Signed)
 Her LDL was under control when neurology checked it.  She however ran out of her crestor  (she is not on lipitor) which we will refill today.

## 2024-04-17 NOTE — Assessment & Plan Note (Signed)
 She was given K IV of 20mEq yesterday.  I am going to restart her back on her potassium supplement of 20mEq po TID. She is to start this ASAP and come back on Friday for a repeat BMP.

## 2024-04-17 NOTE — Assessment & Plan Note (Signed)
 She has been noncompliant in coming to the office and noncompliant with her meds.  Her BP is elevated as she has not been on lisinopril  or amlodipine .  We will refill these meds today.

## 2024-04-19 ENCOUNTER — Ambulatory Visit: Admitting: Internal Medicine

## 2024-04-19 DIAGNOSIS — E782 Mixed hyperlipidemia: Secondary | ICD-10-CM | POA: Diagnosis not present

## 2024-04-19 DIAGNOSIS — G43909 Migraine, unspecified, not intractable, without status migrainosus: Secondary | ICD-10-CM | POA: Diagnosis not present

## 2024-04-19 DIAGNOSIS — E11319 Type 2 diabetes mellitus with unspecified diabetic retinopathy without macular edema: Secondary | ICD-10-CM | POA: Diagnosis not present

## 2024-04-19 DIAGNOSIS — E1165 Type 2 diabetes mellitus with hyperglycemia: Secondary | ICD-10-CM | POA: Diagnosis not present

## 2024-04-19 DIAGNOSIS — E876 Hypokalemia: Secondary | ICD-10-CM | POA: Diagnosis not present

## 2024-04-19 DIAGNOSIS — Z794 Long term (current) use of insulin: Secondary | ICD-10-CM | POA: Diagnosis not present

## 2024-04-19 DIAGNOSIS — Z79899 Other long term (current) drug therapy: Secondary | ICD-10-CM | POA: Diagnosis not present

## 2024-04-19 DIAGNOSIS — I69354 Hemiplegia and hemiparesis following cerebral infarction affecting left non-dominant side: Secondary | ICD-10-CM | POA: Diagnosis not present

## 2024-04-19 DIAGNOSIS — Z7951 Long term (current) use of inhaled steroids: Secondary | ICD-10-CM | POA: Diagnosis not present

## 2024-04-19 DIAGNOSIS — G5600 Carpal tunnel syndrome, unspecified upper limb: Secondary | ICD-10-CM | POA: Diagnosis not present

## 2024-04-19 DIAGNOSIS — F419 Anxiety disorder, unspecified: Secondary | ICD-10-CM | POA: Diagnosis not present

## 2024-04-19 DIAGNOSIS — I10 Essential (primary) hypertension: Secondary | ICD-10-CM | POA: Diagnosis not present

## 2024-04-19 DIAGNOSIS — K219 Gastro-esophageal reflux disease without esophagitis: Secondary | ICD-10-CM | POA: Diagnosis not present

## 2024-04-19 DIAGNOSIS — R296 Repeated falls: Secondary | ICD-10-CM | POA: Diagnosis not present

## 2024-04-19 DIAGNOSIS — Z7985 Long-term (current) use of injectable non-insulin antidiabetic drugs: Secondary | ICD-10-CM | POA: Diagnosis not present

## 2024-04-19 DIAGNOSIS — G5620 Lesion of ulnar nerve, unspecified upper limb: Secondary | ICD-10-CM | POA: Diagnosis not present

## 2024-04-19 DIAGNOSIS — Z7902 Long term (current) use of antithrombotics/antiplatelets: Secondary | ICD-10-CM | POA: Diagnosis not present

## 2024-04-19 DIAGNOSIS — Z7984 Long term (current) use of oral hypoglycemic drugs: Secondary | ICD-10-CM | POA: Diagnosis not present

## 2024-04-19 DIAGNOSIS — Z9181 History of falling: Secondary | ICD-10-CM | POA: Diagnosis not present

## 2024-04-19 NOTE — Progress Notes (Signed)
 Nurse Visit Lab Draw BMP, A1C

## 2024-04-22 DIAGNOSIS — I10 Essential (primary) hypertension: Secondary | ICD-10-CM | POA: Diagnosis not present

## 2024-04-22 DIAGNOSIS — G43909 Migraine, unspecified, not intractable, without status migrainosus: Secondary | ICD-10-CM | POA: Diagnosis not present

## 2024-04-22 DIAGNOSIS — E1165 Type 2 diabetes mellitus with hyperglycemia: Secondary | ICD-10-CM | POA: Diagnosis not present

## 2024-04-22 DIAGNOSIS — E11319 Type 2 diabetes mellitus with unspecified diabetic retinopathy without macular edema: Secondary | ICD-10-CM | POA: Diagnosis not present

## 2024-04-22 DIAGNOSIS — I69354 Hemiplegia and hemiparesis following cerebral infarction affecting left non-dominant side: Secondary | ICD-10-CM | POA: Diagnosis not present

## 2024-04-22 DIAGNOSIS — F419 Anxiety disorder, unspecified: Secondary | ICD-10-CM | POA: Diagnosis not present

## 2024-04-23 ENCOUNTER — Other Ambulatory Visit: Payer: Self-pay | Admitting: Internal Medicine

## 2024-04-23 DIAGNOSIS — E11319 Type 2 diabetes mellitus with unspecified diabetic retinopathy without macular edema: Secondary | ICD-10-CM | POA: Diagnosis not present

## 2024-04-23 DIAGNOSIS — I10 Essential (primary) hypertension: Secondary | ICD-10-CM | POA: Diagnosis not present

## 2024-04-23 DIAGNOSIS — G43909 Migraine, unspecified, not intractable, without status migrainosus: Secondary | ICD-10-CM | POA: Diagnosis not present

## 2024-04-23 DIAGNOSIS — F419 Anxiety disorder, unspecified: Secondary | ICD-10-CM | POA: Diagnosis not present

## 2024-04-23 DIAGNOSIS — I69354 Hemiplegia and hemiparesis following cerebral infarction affecting left non-dominant side: Secondary | ICD-10-CM | POA: Diagnosis not present

## 2024-04-23 DIAGNOSIS — E1165 Type 2 diabetes mellitus with hyperglycemia: Secondary | ICD-10-CM | POA: Diagnosis not present

## 2024-04-23 MED ORDER — TOUJEO MAX SOLOSTAR 300 UNIT/ML ~~LOC~~ SOPN: 70.0000 [IU] | PEN_INJECTOR | Freq: Every day | SUBCUTANEOUS | 2 refills | Status: DC

## 2024-04-24 ENCOUNTER — Ambulatory Visit: Admitting: Internal Medicine

## 2024-04-25 ENCOUNTER — Encounter: Payer: Self-pay | Admitting: Oncology

## 2024-04-25 DIAGNOSIS — I69354 Hemiplegia and hemiparesis following cerebral infarction affecting left non-dominant side: Secondary | ICD-10-CM | POA: Diagnosis not present

## 2024-04-25 DIAGNOSIS — I10 Essential (primary) hypertension: Secondary | ICD-10-CM | POA: Diagnosis not present

## 2024-04-25 DIAGNOSIS — G43909 Migraine, unspecified, not intractable, without status migrainosus: Secondary | ICD-10-CM | POA: Diagnosis not present

## 2024-04-25 DIAGNOSIS — E1165 Type 2 diabetes mellitus with hyperglycemia: Secondary | ICD-10-CM | POA: Diagnosis not present

## 2024-04-25 DIAGNOSIS — E11319 Type 2 diabetes mellitus with unspecified diabetic retinopathy without macular edema: Secondary | ICD-10-CM | POA: Diagnosis not present

## 2024-04-25 DIAGNOSIS — F419 Anxiety disorder, unspecified: Secondary | ICD-10-CM | POA: Diagnosis not present

## 2024-04-26 DIAGNOSIS — G43909 Migraine, unspecified, not intractable, without status migrainosus: Secondary | ICD-10-CM | POA: Diagnosis not present

## 2024-04-26 DIAGNOSIS — E11319 Type 2 diabetes mellitus with unspecified diabetic retinopathy without macular edema: Secondary | ICD-10-CM | POA: Diagnosis not present

## 2024-04-26 DIAGNOSIS — F419 Anxiety disorder, unspecified: Secondary | ICD-10-CM | POA: Diagnosis not present

## 2024-04-26 DIAGNOSIS — I69354 Hemiplegia and hemiparesis following cerebral infarction affecting left non-dominant side: Secondary | ICD-10-CM | POA: Diagnosis not present

## 2024-04-26 DIAGNOSIS — E1165 Type 2 diabetes mellitus with hyperglycemia: Secondary | ICD-10-CM | POA: Diagnosis not present

## 2024-04-26 DIAGNOSIS — I10 Essential (primary) hypertension: Secondary | ICD-10-CM | POA: Diagnosis not present

## 2024-04-26 LAB — BASIC METABOLIC PANEL WITH GFR
BUN/Creatinine Ratio: 8 — ABNORMAL LOW (ref 9–23)
BUN: 6 mg/dL (ref 6–24)
CO2: 30 mmol/L — ABNORMAL HIGH (ref 20–29)
Calcium: 9.2 mg/dL (ref 8.7–10.2)
Chloride: 102 mmol/L (ref 96–106)
Creatinine, Ser: 0.75 mg/dL (ref 0.57–1.00)
Glucose: 218 mg/dL — ABNORMAL HIGH (ref 70–99)
Potassium: 3.5 mmol/L (ref 3.5–5.2)
Sodium: 146 mmol/L — ABNORMAL HIGH (ref 134–144)
eGFR: 92 mL/min/1.73 (ref >59)

## 2024-04-30 ENCOUNTER — Other Ambulatory Visit: Payer: Self-pay

## 2024-04-30 DIAGNOSIS — I10 Essential (primary) hypertension: Secondary | ICD-10-CM | POA: Diagnosis not present

## 2024-04-30 DIAGNOSIS — G43909 Migraine, unspecified, not intractable, without status migrainosus: Secondary | ICD-10-CM | POA: Diagnosis not present

## 2024-04-30 DIAGNOSIS — E11319 Type 2 diabetes mellitus with unspecified diabetic retinopathy without macular edema: Secondary | ICD-10-CM | POA: Diagnosis not present

## 2024-04-30 DIAGNOSIS — F419 Anxiety disorder, unspecified: Secondary | ICD-10-CM | POA: Diagnosis not present

## 2024-04-30 DIAGNOSIS — I69354 Hemiplegia and hemiparesis following cerebral infarction affecting left non-dominant side: Secondary | ICD-10-CM | POA: Diagnosis not present

## 2024-04-30 DIAGNOSIS — E1165 Type 2 diabetes mellitus with hyperglycemia: Secondary | ICD-10-CM | POA: Diagnosis not present

## 2024-04-30 MED ORDER — INSULIN GLARGINE 100 UNIT/ML SOLOSTAR PEN: 80.0000 [IU] | PEN_INJECTOR | Freq: Every day | SUBCUTANEOUS | 1 refills | Status: AC

## 2024-05-01 ENCOUNTER — Other Ambulatory Visit: Payer: Self-pay

## 2024-05-01 ENCOUNTER — Other Ambulatory Visit: Payer: Self-pay | Admitting: Oncology

## 2024-05-01 DIAGNOSIS — G894 Chronic pain syndrome: Secondary | ICD-10-CM

## 2024-05-01 DIAGNOSIS — I69354 Hemiplegia and hemiparesis following cerebral infarction affecting left non-dominant side: Secondary | ICD-10-CM | POA: Diagnosis not present

## 2024-05-01 DIAGNOSIS — E11319 Type 2 diabetes mellitus with unspecified diabetic retinopathy without macular edema: Secondary | ICD-10-CM | POA: Diagnosis not present

## 2024-05-01 DIAGNOSIS — G43909 Migraine, unspecified, not intractable, without status migrainosus: Secondary | ICD-10-CM | POA: Diagnosis not present

## 2024-05-01 DIAGNOSIS — F419 Anxiety disorder, unspecified: Secondary | ICD-10-CM | POA: Diagnosis not present

## 2024-05-01 DIAGNOSIS — R161 Splenomegaly, not elsewhere classified: Secondary | ICD-10-CM

## 2024-05-01 DIAGNOSIS — M898X9 Other specified disorders of bone, unspecified site: Secondary | ICD-10-CM

## 2024-05-01 DIAGNOSIS — E1165 Type 2 diabetes mellitus with hyperglycemia: Secondary | ICD-10-CM | POA: Diagnosis not present

## 2024-05-01 DIAGNOSIS — I10 Essential (primary) hypertension: Secondary | ICD-10-CM | POA: Diagnosis not present

## 2024-05-01 MED ORDER — OXYCODONE HCL 5 MG PO TABS: 5.0000 mg | ORAL_TABLET | ORAL | 0 refills | Status: DC | PRN

## 2024-05-02 ENCOUNTER — Other Ambulatory Visit: Payer: Self-pay

## 2024-05-02 ENCOUNTER — Ambulatory Visit: Admitting: Oncology

## 2024-05-02 ENCOUNTER — Other Ambulatory Visit

## 2024-05-06 ENCOUNTER — Telehealth: Payer: Self-pay

## 2024-05-06 DIAGNOSIS — F419 Anxiety disorder, unspecified: Secondary | ICD-10-CM | POA: Diagnosis not present

## 2024-05-06 DIAGNOSIS — I69354 Hemiplegia and hemiparesis following cerebral infarction affecting left non-dominant side: Secondary | ICD-10-CM | POA: Diagnosis not present

## 2024-05-06 DIAGNOSIS — G43909 Migraine, unspecified, not intractable, without status migrainosus: Secondary | ICD-10-CM | POA: Diagnosis not present

## 2024-05-06 DIAGNOSIS — I10 Essential (primary) hypertension: Secondary | ICD-10-CM | POA: Diagnosis not present

## 2024-05-06 DIAGNOSIS — E1165 Type 2 diabetes mellitus with hyperglycemia: Secondary | ICD-10-CM | POA: Diagnosis not present

## 2024-05-06 DIAGNOSIS — E11319 Type 2 diabetes mellitus with unspecified diabetic retinopathy without macular edema: Secondary | ICD-10-CM | POA: Diagnosis not present

## 2024-05-06 NOTE — Telephone Encounter (Signed)
 Pt called this morning req a long acting pain medication. Her pain is 5/10 left side of her abdomen. [phone]. I told her you were not in office on Monday's, so it would be Tuesday before you get the message. She was fine with that.

## 2024-05-07 DIAGNOSIS — E1165 Type 2 diabetes mellitus with hyperglycemia: Secondary | ICD-10-CM | POA: Diagnosis not present

## 2024-05-07 DIAGNOSIS — G43909 Migraine, unspecified, not intractable, without status migrainosus: Secondary | ICD-10-CM | POA: Diagnosis not present

## 2024-05-07 DIAGNOSIS — F419 Anxiety disorder, unspecified: Secondary | ICD-10-CM | POA: Diagnosis not present

## 2024-05-07 DIAGNOSIS — I10 Essential (primary) hypertension: Secondary | ICD-10-CM | POA: Diagnosis not present

## 2024-05-07 DIAGNOSIS — I69354 Hemiplegia and hemiparesis following cerebral infarction affecting left non-dominant side: Secondary | ICD-10-CM | POA: Diagnosis not present

## 2024-05-07 DIAGNOSIS — E11319 Type 2 diabetes mellitus with unspecified diabetic retinopathy without macular edema: Secondary | ICD-10-CM | POA: Diagnosis not present

## 2024-05-08 DIAGNOSIS — I10 Essential (primary) hypertension: Secondary | ICD-10-CM | POA: Diagnosis not present

## 2024-05-08 DIAGNOSIS — F419 Anxiety disorder, unspecified: Secondary | ICD-10-CM | POA: Diagnosis not present

## 2024-05-08 DIAGNOSIS — G43909 Migraine, unspecified, not intractable, without status migrainosus: Secondary | ICD-10-CM | POA: Diagnosis not present

## 2024-05-08 DIAGNOSIS — E1165 Type 2 diabetes mellitus with hyperglycemia: Secondary | ICD-10-CM | POA: Diagnosis not present

## 2024-05-08 DIAGNOSIS — E11319 Type 2 diabetes mellitus with unspecified diabetic retinopathy without macular edema: Secondary | ICD-10-CM | POA: Diagnosis not present

## 2024-05-08 DIAGNOSIS — I69354 Hemiplegia and hemiparesis following cerebral infarction affecting left non-dominant side: Secondary | ICD-10-CM | POA: Diagnosis not present

## 2024-05-13 DIAGNOSIS — I10 Essential (primary) hypertension: Secondary | ICD-10-CM | POA: Diagnosis not present

## 2024-05-13 DIAGNOSIS — F419 Anxiety disorder, unspecified: Secondary | ICD-10-CM | POA: Diagnosis not present

## 2024-05-13 DIAGNOSIS — E1165 Type 2 diabetes mellitus with hyperglycemia: Secondary | ICD-10-CM | POA: Diagnosis not present

## 2024-05-13 DIAGNOSIS — E11319 Type 2 diabetes mellitus with unspecified diabetic retinopathy without macular edema: Secondary | ICD-10-CM | POA: Diagnosis not present

## 2024-05-13 DIAGNOSIS — I69354 Hemiplegia and hemiparesis following cerebral infarction affecting left non-dominant side: Secondary | ICD-10-CM | POA: Diagnosis not present

## 2024-05-13 DIAGNOSIS — G43909 Migraine, unspecified, not intractable, without status migrainosus: Secondary | ICD-10-CM | POA: Diagnosis not present

## 2024-05-15 ENCOUNTER — Ambulatory Visit: Admitting: Internal Medicine

## 2024-05-15 ENCOUNTER — Encounter: Payer: Self-pay | Admitting: Internal Medicine

## 2024-05-15 VITALS — BP 144/60 | HR 68 | Temp 98.0°F | Resp 16 | Ht 64.0 in | Wt 130.0 lb

## 2024-05-15 DIAGNOSIS — E782 Mixed hyperlipidemia: Secondary | ICD-10-CM | POA: Diagnosis not present

## 2024-05-15 DIAGNOSIS — E1165 Type 2 diabetes mellitus with hyperglycemia: Secondary | ICD-10-CM

## 2024-05-15 DIAGNOSIS — Z8673 Personal history of transient ischemic attack (TIA), and cerebral infarction without residual deficits: Secondary | ICD-10-CM

## 2024-05-15 NOTE — Assessment & Plan Note (Signed)
 We ordered PT/OT/ST and I am not sure what happened with her getting this.  I have asked my nurse to find out why she is not getting these therapies from homehealth.

## 2024-05-15 NOTE — Assessment & Plan Note (Signed)
 We will check her HgBa1c and choelsterol panel today.

## 2024-05-15 NOTE — Addendum Note (Signed)
 Addended by: LENETTA LACKS on: 05/15/2024 03:40 PM   Modules accepted: Orders

## 2024-05-15 NOTE — Progress Notes (Signed)
 Office Visit  Subjective   Patient ID: Leslea Vowles   DOB: 08-Aug-1965   Age: 58 y.o.   MRN: [id number]   Chief Complaint Chief Complaint  Patient presents with   Follow-up    4 Week follow up.      History of Present Illness The patient returns today for followup of her recent stroke and to be sure she is getting thereapies.  I saw her 4 weeks ago where she had a stroke and we had referred her to [facility] PT/OT/ST.  She tells me today that they are working on ST but she has not seen the PT or OT.  Again, she saw neurology in 03/2024  and she has been diagnosed with another recurrent stroke.  She was also noted to have hypokalemia where we started her on KCL.    The patient had a recurrent stroke when she came to see me in 11/2022.  She saw me on 12/08/2022 where she came in for an acute visit with trouble with walking which started 2-3 weeks prior.  She has had had multiple strokes related to her thrombocytosis where she has residual sequalae of left sided weakness but was able to use that side.  She has not been following with Neurology.She stated that she began having worsening weakness of her left side of both her arm and her leg that started 2-3 weeks prior.  There was no weakness of her right side.  She actually had about 4 falls with abrasions to her knees. The patient denied any dizziness but did complain of worsening left sided weakness where she had to hold on to things to walk and she began using a rolling walker.  She did follow with Hematology with Dr. Cornelius where she last saw her in 10/2022 where her thrombocytosis was controlled and she was noted to have a platelet count on 11/21/2022 of 272.  She is currently treated with hydroxyruea.  The patient was diagnosed with thrombocytosis in 2005.  This new weakness was not effecting her face, speech, vision or memory.  I did a CT scan of her head on 12/08/2022 and there was no acute intracranial abnormality.  There was chronic  microvascular disease and brain atrophy and remote lacunar infarcts within her bilateral basal ganglia.  We therefore did a MRI of brain which was scheduled on 12/20/2022 and this showed a punctate acute to subacute lacunar infarct in the posterior left lentiform.  There was no associated hemorrhage or mass effect.  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  We did get therapies involved at that time.    She was having problems with balance and falls at time.  I did increase her lipitor from 40mg  to 80mg  at bedtime and she was continued on ASA 325mg  daily and plavix  75mg  daily.  The patient was supposed to come in for followup but we have had problems with compliance and she has not been seen in 6 months by myself when I saw her earlier in the year.  We did refer her to neurology who saw her on 03/06/2024.  They noted in their notes that they were going to order a CT of her head.  They wanted to continue ASA for stroke prevention and pending repeat CT consider transition to plavix  if new stroke (she is already supposed to be on plavix ).  They repeated her LDL on 03/06/2024  and her LDL 61 with goal to be less than <70.  Her BP was noted to be elevated and neurology increased her hydrochlorothiazide  from 12.5mg  to 25mg  daily at that time.  They referred her for homehealth therapies.  She states homehealth never came to the house.   Neurology did a CT scan of her head on 04/03/2024 and this showed no acute intracranial abnormality.  There was remote lacunar infarcts in the bilateral basal ganglia and right thalamus.  The right thalamic infarct is new since 2024.  Moderate chronic microvascular ischemic changes.  Generalized parenchymal volume loss, mild cerebellar volume loss, and brainstem atrophy, similar to prior MRI.  The patient again tellls me she ran out of plavix  3-4 weeks ago.  She has had falls and has difficulty with  ambulation where she states someone has to hold her hand in order for her to ambulate.  She is having right sided weakness that started a month ago.      Past Medical History Past Medical History:  Diagnosis Date   Acid reflux    Anxiety    Carpal tunnel syndrome    Cerebrovascular disease    Diabetic retinopathy (HCC)    Family history of pancreatic cancer 08/09/2021   Gait abnormality    History of stroke    Hyperlipidemia    Hypertension    Migraine    Pancreatic cyst 08/09/2021   Retinal detachment    Thrombocytosis    Trigger finger    Ulnar nerve entrapment      Allergies Allergies  Allergen Reactions   Iodinated Contrast Media Other (See Comments) and Shortness Of Breath   Morphine  Itching and Other (See Comments)    Makes me feel out of my mind per pt.     Medications  Current Outpatient Medications:    ALPRAZolam  (XANAX ) 0.5 MG tablet, Take 1 tablet (0.5 mg total) by mouth every 8 (eight) hours as needed for anxiety., Disp: 90 tablet, Rfl: 2   amLODipine  (NORVASC ) 10 MG tablet, Take 1 tablet (10 mg total) by mouth daily., Disp: 90 tablet, Rfl: 1   aspirin EC 81 MG tablet, Take 81 mg by mouth daily. Swallow whole., Disp: , Rfl:    BD PEN NEEDLE NANO 2ND GEN 32G X 4 MM MISC, CHECK BLOOD SUGAR THREE TIMES A DAY, Disp: 100 each, Rfl: 3   clopidogrel  (PLAVIX ) 75 MG tablet, Take 1 tablet (75 mg total) by mouth daily., Disp: 90 tablet, Rfl: 1   fluticasone  (FLONASE ) 50 MCG/ACT nasal spray, PLACE 1 SPRAY INTO BOTH NOSTRILS IN THE MORNING AND AT BEDTIME., Disp: 48 mL, Rfl: 1   fluticasone  (FLOVENT  HFA) 44 MCG/ACT inhaler, , Disp: , Rfl:    hydrochlorothiazide  (HYDRODIURIL ) 25 MG tablet, Take 1 tablet (25 mg total) by mouth daily., Disp: 90 tablet, Rfl: 1   insulin  aspart (NOVOLOG  FLEXPEN) 100 UNIT/ML FlexPen, Inject 35 Units into the skin 3 (three) times daily with meals., Disp: 15 mL, Rfl: 11   insulin  glargine (LANTUS) 100 UNIT/ML Solostar Pen, Inject 80 Units into  the skin daily., Disp: 15 mL, Rfl: 1   lisinopril  (ZESTRIL ) 40 MG tablet, Take 1 tablet (40 mg total) by mouth daily., Disp: 90 tablet, Rfl: 1   metFORMIN  (GLUCOPHAGE ) 1000 MG tablet, TAKE 1 TABLET (1,000 MG TOTAL) BY MOUTH TWICE A DAY WITH FOOD, Disp: 180 tablet, Rfl: 0   naloxone  (NARCAN ) nasal spray 4 mg/0.1 mL, As directed, Disp: 2 each, Rfl: 1   ondansetron  (ZOFRAN ) 4 MG  tablet, Take 1 tablet (4 mg total) by mouth every 8 (eight) hours as needed for nausea or vomiting., Disp: 20 tablet, Rfl: 0   oxyCODONE  (OXY IR/ROXICODONE ) 5 MG immediate release tablet, Take 1 tablet (5 mg total) by mouth every 4 (four) hours as needed for severe pain (pain score 7-10)., Disp: 150 tablet, Rfl: 0   Potassium Chloride  ER 20 MEQ TBCR, Take 1 tablet (20 mEq total) by mouth in the morning, at noon, and at bedtime., Disp: 270 tablet, Rfl: 2   prochlorperazine (COMPAZINE) 10 MG tablet, Take 10 mg by mouth every 6 (six) hours as needed for nausea or vomiting., Disp: , Rfl:    RABEprazole  (ACIPHEX ) 20 MG tablet, Take 1 tablet (20 mg total) by mouth daily., Disp: 90 tablet, Rfl: 0   rosuvastatin  (CRESTOR ) 20 MG tablet, Take 1 tablet (20 mg total) by mouth daily., Disp: 30 tablet, Rfl: 11   Review of Systems Review of Systems  Constitutional:  Negative for chills, fever and malaise/fatigue.  Eyes:  Negative for blurred vision and double vision.  Respiratory:  Negative for shortness of breath.   Cardiovascular:  Negative for chest pain, palpitations and leg swelling.  Gastrointestinal:  Negative for abdominal pain, constipation, diarrhea, nausea and vomiting.  Musculoskeletal:  Negative for myalgias.  Neurological:  Negative for dizziness, weakness and headaches.       Objective:    Vitals BP (!) 144/60   Pulse 68   Temp 98 F (36.7 C) (Temporal)   Resp 16   Ht 5' 4 (1.626 m)   Wt 130 lb (59 kg)   SpO2 99%   BMI 22.31 kg/m    Physical Examination Physical Exam Constitutional:      Appearance:  Normal appearance. She is not ill-appearing.  Cardiovascular:     Rate and Rhythm: Normal rate and regular rhythm.     Pulses: Normal pulses.     Heart sounds: No murmur heard.    No friction rub. No gallop.  Pulmonary:     Effort: Pulmonary effort is normal. No respiratory distress.     Breath sounds: No wheezing, rhonchi or rales.  Abdominal:     General: Bowel sounds are normal. There is no distension.     Palpations: Abdomen is soft.     Tenderness: There is no abdominal tenderness.  Musculoskeletal:     Right lower leg: No edema.     Left lower leg: No edema.  Skin:    General: Skin is warm and dry.     Findings: No rash.  Neurological:     Mental Status: She is alert.        Assessment & Plan:   Inadequately controlled diabetes mellitus (HCC) We will check her HgBa1c and choelsterol panel today.  History of stroke We ordered PT/OT/ST and I am not sure what happened with her getting this.  I have asked my nurse to find out why she is not getting these therapies from homehealth.    Return in about 3 months (around 08/15/2024).   Selinda Fleeta Finger, MD

## 2024-05-16 DIAGNOSIS — I10 Essential (primary) hypertension: Secondary | ICD-10-CM | POA: Diagnosis not present

## 2024-05-16 DIAGNOSIS — I69354 Hemiplegia and hemiparesis following cerebral infarction affecting left non-dominant side: Secondary | ICD-10-CM | POA: Diagnosis not present

## 2024-05-16 DIAGNOSIS — F419 Anxiety disorder, unspecified: Secondary | ICD-10-CM | POA: Diagnosis not present

## 2024-05-16 DIAGNOSIS — G43909 Migraine, unspecified, not intractable, without status migrainosus: Secondary | ICD-10-CM | POA: Diagnosis not present

## 2024-05-16 DIAGNOSIS — E11319 Type 2 diabetes mellitus with unspecified diabetic retinopathy without macular edema: Secondary | ICD-10-CM | POA: Diagnosis not present

## 2024-05-16 DIAGNOSIS — E1165 Type 2 diabetes mellitus with hyperglycemia: Secondary | ICD-10-CM | POA: Diagnosis not present

## 2024-05-17 LAB — CMP14 + ANION GAP
ALT: 35 IU/L — ABNORMAL HIGH (ref 0–32)
AST: 35 IU/L (ref 0–40)
Albumin: 4.2 g/dL (ref 3.8–4.9)
Alkaline Phosphatase: 81 IU/L (ref 49–135)
Anion Gap: 23 mmol/L — ABNORMAL HIGH (ref 10.0–18.0)
BUN/Creatinine Ratio: 14 (ref 9–23)
BUN: 9 mg/dL (ref 6–24)
Bilirubin Total: 1.4 mg/dL — ABNORMAL HIGH (ref 0.0–1.2)
CO2: 35 mmol/L — ABNORMAL HIGH (ref 20–29)
Calcium: 9.4 mg/dL (ref 8.7–10.2)
Chloride: 90 mmol/L — ABNORMAL LOW (ref 96–106)
Creatinine, Ser: 0.64 mg/dL (ref 0.57–1.00)
Globulin, Total: 2.4 g/dL (ref 1.5–4.5)
Glucose: 75 mg/dL (ref 70–99)
Potassium: 2.3 mmol/L — CL (ref 3.5–5.2)
Sodium: 148 mmol/L — ABNORMAL HIGH (ref 134–144)
Total Protein: 6.6 g/dL (ref 6.0–8.5)
eGFR: 102 mL/min/1.73 (ref >59)

## 2024-05-17 LAB — LIPID PANEL
Chol/HDL Ratio: 1.8 ratio (ref 0.0–4.4)
Cholesterol, Total: 58 mg/dL — ABNORMAL LOW (ref 100–199)
HDL: 32 mg/dL — ABNORMAL LOW (ref >39)
LDL Chol Calc (NIH): 10 mg/dL (ref 0–99)
Triglycerides: 67 mg/dL (ref 0–149)
VLDL Cholesterol Cal: 16 mg/dL (ref 5–40)

## 2024-05-17 LAB — HEMOGLOBIN A1C
Est. average glucose Bld gHb Est-mCnc: 137 mg/dL
Hgb A1c MFr Bld: 6.4 % — ABNORMAL HIGH (ref 4.8–5.6)

## 2024-05-19 DIAGNOSIS — K219 Gastro-esophageal reflux disease without esophagitis: Secondary | ICD-10-CM | POA: Diagnosis not present

## 2024-05-19 DIAGNOSIS — G5620 Lesion of ulnar nerve, unspecified upper limb: Secondary | ICD-10-CM | POA: Diagnosis not present

## 2024-05-19 DIAGNOSIS — E1165 Type 2 diabetes mellitus with hyperglycemia: Secondary | ICD-10-CM | POA: Diagnosis not present

## 2024-05-19 DIAGNOSIS — G43909 Migraine, unspecified, not intractable, without status migrainosus: Secondary | ICD-10-CM | POA: Diagnosis not present

## 2024-05-19 DIAGNOSIS — E782 Mixed hyperlipidemia: Secondary | ICD-10-CM | POA: Diagnosis not present

## 2024-05-19 DIAGNOSIS — Z794 Long term (current) use of insulin: Secondary | ICD-10-CM | POA: Diagnosis not present

## 2024-05-19 DIAGNOSIS — I10 Essential (primary) hypertension: Secondary | ICD-10-CM | POA: Diagnosis not present

## 2024-05-19 DIAGNOSIS — Z7951 Long term (current) use of inhaled steroids: Secondary | ICD-10-CM | POA: Diagnosis not present

## 2024-05-19 DIAGNOSIS — Z79899 Other long term (current) drug therapy: Secondary | ICD-10-CM | POA: Diagnosis not present

## 2024-05-19 DIAGNOSIS — I69354 Hemiplegia and hemiparesis following cerebral infarction affecting left non-dominant side: Secondary | ICD-10-CM | POA: Diagnosis not present

## 2024-05-19 DIAGNOSIS — Z7902 Long term (current) use of antithrombotics/antiplatelets: Secondary | ICD-10-CM | POA: Diagnosis not present

## 2024-05-19 DIAGNOSIS — F419 Anxiety disorder, unspecified: Secondary | ICD-10-CM | POA: Diagnosis not present

## 2024-05-19 DIAGNOSIS — Z7984 Long term (current) use of oral hypoglycemic drugs: Secondary | ICD-10-CM | POA: Diagnosis not present

## 2024-05-19 DIAGNOSIS — Z7985 Long-term (current) use of injectable non-insulin antidiabetic drugs: Secondary | ICD-10-CM | POA: Diagnosis not present

## 2024-05-19 DIAGNOSIS — Z9181 History of falling: Secondary | ICD-10-CM | POA: Diagnosis not present

## 2024-05-19 DIAGNOSIS — E11319 Type 2 diabetes mellitus with unspecified diabetic retinopathy without macular edema: Secondary | ICD-10-CM | POA: Diagnosis not present

## 2024-05-19 DIAGNOSIS — R296 Repeated falls: Secondary | ICD-10-CM | POA: Diagnosis not present

## 2024-05-19 DIAGNOSIS — E876 Hypokalemia: Secondary | ICD-10-CM | POA: Diagnosis not present

## 2024-05-19 DIAGNOSIS — G5600 Carpal tunnel syndrome, unspecified upper limb: Secondary | ICD-10-CM | POA: Diagnosis not present

## 2024-05-20 ENCOUNTER — Other Ambulatory Visit: Payer: Self-pay

## 2024-05-20 DIAGNOSIS — E11319 Type 2 diabetes mellitus with unspecified diabetic retinopathy without macular edema: Secondary | ICD-10-CM | POA: Diagnosis not present

## 2024-05-20 DIAGNOSIS — I69354 Hemiplegia and hemiparesis following cerebral infarction affecting left non-dominant side: Secondary | ICD-10-CM | POA: Diagnosis not present

## 2024-05-20 DIAGNOSIS — F419 Anxiety disorder, unspecified: Secondary | ICD-10-CM | POA: Diagnosis not present

## 2024-05-20 DIAGNOSIS — E1165 Type 2 diabetes mellitus with hyperglycemia: Secondary | ICD-10-CM | POA: Diagnosis not present

## 2024-05-20 DIAGNOSIS — I10 Essential (primary) hypertension: Secondary | ICD-10-CM | POA: Diagnosis not present

## 2024-05-20 DIAGNOSIS — G43909 Migraine, unspecified, not intractable, without status migrainosus: Secondary | ICD-10-CM | POA: Diagnosis not present

## 2024-05-20 NOTE — Progress Notes (Signed)
 Per [employer] [facility] home health called to report a fall on 05-15-24. No injuries reported. Allison Stevens

## 2024-05-21 DIAGNOSIS — E11319 Type 2 diabetes mellitus with unspecified diabetic retinopathy without macular edema: Secondary | ICD-10-CM | POA: Diagnosis not present

## 2024-05-21 DIAGNOSIS — I69354 Hemiplegia and hemiparesis following cerebral infarction affecting left non-dominant side: Secondary | ICD-10-CM | POA: Diagnosis not present

## 2024-05-21 DIAGNOSIS — F419 Anxiety disorder, unspecified: Secondary | ICD-10-CM | POA: Diagnosis not present

## 2024-05-21 DIAGNOSIS — E1165 Type 2 diabetes mellitus with hyperglycemia: Secondary | ICD-10-CM | POA: Diagnosis not present

## 2024-05-21 DIAGNOSIS — G43909 Migraine, unspecified, not intractable, without status migrainosus: Secondary | ICD-10-CM | POA: Diagnosis not present

## 2024-05-21 DIAGNOSIS — I10 Essential (primary) hypertension: Secondary | ICD-10-CM | POA: Diagnosis not present

## 2024-05-22 DIAGNOSIS — I10 Essential (primary) hypertension: Secondary | ICD-10-CM | POA: Diagnosis not present

## 2024-05-22 DIAGNOSIS — E11319 Type 2 diabetes mellitus with unspecified diabetic retinopathy without macular edema: Secondary | ICD-10-CM | POA: Diagnosis not present

## 2024-05-22 DIAGNOSIS — F419 Anxiety disorder, unspecified: Secondary | ICD-10-CM | POA: Diagnosis not present

## 2024-05-22 DIAGNOSIS — G43909 Migraine, unspecified, not intractable, without status migrainosus: Secondary | ICD-10-CM | POA: Diagnosis not present

## 2024-05-22 DIAGNOSIS — I69354 Hemiplegia and hemiparesis following cerebral infarction affecting left non-dominant side: Secondary | ICD-10-CM | POA: Diagnosis not present

## 2024-05-22 DIAGNOSIS — E1165 Type 2 diabetes mellitus with hyperglycemia: Secondary | ICD-10-CM | POA: Diagnosis not present

## 2024-05-23 DIAGNOSIS — I69354 Hemiplegia and hemiparesis following cerebral infarction affecting left non-dominant side: Secondary | ICD-10-CM | POA: Diagnosis not present

## 2024-05-23 DIAGNOSIS — F419 Anxiety disorder, unspecified: Secondary | ICD-10-CM | POA: Diagnosis not present

## 2024-05-23 DIAGNOSIS — G43909 Migraine, unspecified, not intractable, without status migrainosus: Secondary | ICD-10-CM | POA: Diagnosis not present

## 2024-05-23 DIAGNOSIS — I10 Essential (primary) hypertension: Secondary | ICD-10-CM | POA: Diagnosis not present

## 2024-05-23 DIAGNOSIS — E1165 Type 2 diabetes mellitus with hyperglycemia: Secondary | ICD-10-CM | POA: Diagnosis not present

## 2024-05-23 DIAGNOSIS — E11319 Type 2 diabetes mellitus with unspecified diabetic retinopathy without macular edema: Secondary | ICD-10-CM | POA: Diagnosis not present

## 2024-05-27 DIAGNOSIS — I69354 Hemiplegia and hemiparesis following cerebral infarction affecting left non-dominant side: Secondary | ICD-10-CM | POA: Diagnosis not present

## 2024-05-27 DIAGNOSIS — E11319 Type 2 diabetes mellitus with unspecified diabetic retinopathy without macular edema: Secondary | ICD-10-CM | POA: Diagnosis not present

## 2024-05-27 DIAGNOSIS — F419 Anxiety disorder, unspecified: Secondary | ICD-10-CM | POA: Diagnosis not present

## 2024-05-27 DIAGNOSIS — I10 Essential (primary) hypertension: Secondary | ICD-10-CM | POA: Diagnosis not present

## 2024-05-27 DIAGNOSIS — G43909 Migraine, unspecified, not intractable, without status migrainosus: Secondary | ICD-10-CM | POA: Diagnosis not present

## 2024-05-27 DIAGNOSIS — E1165 Type 2 diabetes mellitus with hyperglycemia: Secondary | ICD-10-CM | POA: Diagnosis not present

## 2024-05-28 DIAGNOSIS — I69354 Hemiplegia and hemiparesis following cerebral infarction affecting left non-dominant side: Secondary | ICD-10-CM | POA: Diagnosis not present

## 2024-05-28 DIAGNOSIS — G43909 Migraine, unspecified, not intractable, without status migrainosus: Secondary | ICD-10-CM | POA: Diagnosis not present

## 2024-05-28 DIAGNOSIS — F419 Anxiety disorder, unspecified: Secondary | ICD-10-CM | POA: Diagnosis not present

## 2024-05-28 DIAGNOSIS — E1165 Type 2 diabetes mellitus with hyperglycemia: Secondary | ICD-10-CM | POA: Diagnosis not present

## 2024-05-28 DIAGNOSIS — E11319 Type 2 diabetes mellitus with unspecified diabetic retinopathy without macular edema: Secondary | ICD-10-CM | POA: Diagnosis not present

## 2024-05-28 DIAGNOSIS — I10 Essential (primary) hypertension: Secondary | ICD-10-CM | POA: Diagnosis not present

## 2024-05-29 ENCOUNTER — Other Ambulatory Visit: Payer: Self-pay

## 2024-05-29 ENCOUNTER — Other Ambulatory Visit

## 2024-05-29 ENCOUNTER — Ambulatory Visit: Admitting: Oncology

## 2024-05-29 DIAGNOSIS — G43909 Migraine, unspecified, not intractable, without status migrainosus: Secondary | ICD-10-CM | POA: Diagnosis not present

## 2024-05-29 DIAGNOSIS — I10 Essential (primary) hypertension: Secondary | ICD-10-CM | POA: Diagnosis not present

## 2024-05-29 DIAGNOSIS — F419 Anxiety disorder, unspecified: Secondary | ICD-10-CM | POA: Diagnosis not present

## 2024-05-29 DIAGNOSIS — E1165 Type 2 diabetes mellitus with hyperglycemia: Secondary | ICD-10-CM | POA: Diagnosis not present

## 2024-05-29 DIAGNOSIS — E11319 Type 2 diabetes mellitus with unspecified diabetic retinopathy without macular edema: Secondary | ICD-10-CM | POA: Diagnosis not present

## 2024-05-29 DIAGNOSIS — I69354 Hemiplegia and hemiparesis following cerebral infarction affecting left non-dominant side: Secondary | ICD-10-CM | POA: Diagnosis not present

## 2024-05-29 DIAGNOSIS — G894 Chronic pain syndrome: Secondary | ICD-10-CM

## 2024-05-29 MED ORDER — OXYCODONE HCL 5 MG PO TABS: 5.0000 mg | ORAL_TABLET | ORAL | 0 refills | Status: DC | PRN

## 2024-05-30 ENCOUNTER — Ambulatory Visit: Admitting: Oncology

## 2024-05-30 ENCOUNTER — Other Ambulatory Visit

## 2024-05-31 ENCOUNTER — Other Ambulatory Visit: Payer: Self-pay | Admitting: Oncology

## 2024-05-31 ENCOUNTER — Encounter: Payer: Self-pay | Admitting: Oncology

## 2024-05-31 ENCOUNTER — Inpatient Hospital Stay: Admitting: Oncology

## 2024-05-31 ENCOUNTER — Inpatient Hospital Stay: Attending: Oncology

## 2024-05-31 ENCOUNTER — Inpatient Hospital Stay

## 2024-05-31 ENCOUNTER — Telehealth: Payer: Self-pay

## 2024-05-31 VITALS — BP 165/76 | HR 66 | Temp 97.9°F | Resp 18 | Ht 64.0 in

## 2024-05-31 DIAGNOSIS — E876 Hypokalemia: Secondary | ICD-10-CM | POA: Diagnosis not present

## 2024-05-31 DIAGNOSIS — K862 Cyst of pancreas: Secondary | ICD-10-CM | POA: Diagnosis not present

## 2024-05-31 DIAGNOSIS — D473 Essential (hemorrhagic) thrombocythemia: Secondary | ICD-10-CM

## 2024-05-31 DIAGNOSIS — D630 Anemia in neoplastic disease: Secondary | ICD-10-CM

## 2024-05-31 LAB — CBC WITH DIFFERENTIAL (CANCER CENTER ONLY)
Abs Immature Granulocytes: 0.02 K/uL (ref 0.00–0.07)
Basophils Absolute: 0 K/uL (ref 0.0–0.1)
Basophils Relative: 0 %
Eosinophils Absolute: 0 K/uL (ref 0.0–0.5)
Eosinophils Relative: 0 %
HCT: 27.8 % — ABNORMAL LOW (ref 36.0–46.0)
Hemoglobin: 9.7 g/dL — ABNORMAL LOW (ref 12.0–15.0)
Immature Granulocytes: 1 %
Lymphocytes Relative: 23 %
Lymphs Abs: 0.7 K/uL (ref 0.7–4.0)
MCH: 32.3 pg (ref 26.0–34.0)
MCHC: 34.9 g/dL (ref 30.0–36.0)
MCV: 92.7 fL (ref 80.0–100.0)
Monocytes Absolute: 0.1 K/uL (ref 0.1–1.0)
Monocytes Relative: 4 %
Neutro Abs: 2.1 K/uL (ref 1.7–7.7)
Neutrophils Relative %: 72 %
Platelet Count: 225 K/uL (ref 150–400)
RBC: 3 MIL/uL — ABNORMAL LOW (ref 3.87–5.11)
RDW: 16.1 % — ABNORMAL HIGH (ref 11.5–15.5)
WBC Count: 3 K/uL — ABNORMAL LOW (ref 4.0–10.5)
nRBC: 0 % (ref 0.0–0.2)

## 2024-05-31 LAB — CMP (CANCER CENTER ONLY)
ALT: 45 U/L — ABNORMAL HIGH (ref 0–44)
AST: 42 U/L — ABNORMAL HIGH (ref 15–41)
Albumin: 4 g/dL (ref 3.5–5.0)
Alkaline Phosphatase: 70 U/L (ref 38–126)
Anion gap: 10 (ref 5–15)
BUN: 6 mg/dL (ref 6–20)
CO2: 40 mmol/L — ABNORMAL HIGH (ref 22–32)
Calcium: 9 mg/dL (ref 8.9–10.3)
Chloride: 93 mmol/L — ABNORMAL LOW (ref 98–111)
Creatinine: 0.72 mg/dL (ref 0.44–1.00)
GFR, Estimated: >60 mL/min (ref >60)
Glucose, Bld: 174 mg/dL — ABNORMAL HIGH (ref 70–99)
Potassium: 2.1 mmol/L — CL (ref 3.5–5.1)
Sodium: 143 mmol/L (ref 135–145)
Total Bilirubin: 1.4 mg/dL — ABNORMAL HIGH (ref 0.0–1.2)
Total Protein: 6.4 g/dL — ABNORMAL LOW (ref 6.5–8.1)

## 2024-05-31 MED ORDER — SODIUM CHLORIDE 0.9 % IV SOLN: INTRAVENOUS | Status: DC

## 2024-05-31 MED ORDER — POTASSIUM CHLORIDE 10 MEQ/100ML IV SOLN
10.0000 meq | INTRAVENOUS | Status: AC
  Administered 2024-05-31 (×4): 10 meq via INTRAVENOUS
  Filled 2024-05-31 (×4): qty 100

## 2024-05-31 NOTE — Patient Instructions (Signed)
 Potassium Chloride Injection What is this medication? POTASSIUM CHLORIDE (poe TASS i um KLOOR ide) prevents and treats low levels of potassium in your body. Potassium plays an important role in maintaining the health of your kidneys, heart, muscles, and nervous system. This medicine may be used for other purposes; ask your health care provider or pharmacist if you have questions. COMMON BRAND NAME(S): PROAMP What should I tell my care team before I take this medication? They need to know if you have any of these conditions: Addison disease Dehydration Diabetes (high blood sugar) Heart disease High levels of potassium in the blood Irregular heartbeat or rhythm Kidney disease Large areas of burned skin An unusual or allergic reaction to potassium, other medications, foods, dyes, or preservatives Pregnant or trying to get pregnant Breast-feeding How should I use this medication? This medication is injected into a vein. It is given in a hospital or clinic setting. Talk to your care team about the use of this medication in children. Special care may be needed. Overdosage: If you think you have taken too much of this medicine contact a poison control center or emergency room at once. NOTE: This medicine is only for you. Do not share this medicine with others. What if I miss a dose? This does not apply. This medication is not for regular use. What may interact with this medication? Do not take this medication with any of the following: Certain diuretics, such as spironolactone, triamterene Eplerenone Sodium polystyrene sulfonate This medication may also interact with the following: Certain medications for blood pressure or heart disease, such as lisinopril, losartan, quinapril, valsartan Medications that lower your chance of fighting infection, such as cyclosporine, tacrolimus NSAIDs, medications for pain and inflammation, such as ibuprofen or naproxen Other potassium supplements Salt  substitutes This list may not describe all possible interactions. Give your health care provider a list of all the medicines, herbs, non-prescription drugs, or dietary supplements you use. Also tell them if you smoke, drink alcohol, or use illegal drugs. Some items may interact with your medicine. What should I watch for while using this medication? Visit your care team for regular checks on your progress. Tell your care team if your symptoms do not start to get better or if they get worse. You may need blood work while you are taking this medication. Avoid salt substitutes unless you are told otherwise by your care team. What side effects may I notice from receiving this medication? Side effects that you should report to your care team as soon as possible: Allergic reactions--skin rash, itching, hives, swelling of the face, lips, tongue, or throat High potassium level--muscle weakness, fast or irregular heartbeat Side effects that usually do not require medical attention (report to your care team if they continue or are bothersome): Diarrhea Nausea Stomach pain Vomiting This list may not describe all possible side effects. Call your doctor for medical advice about side effects. You may report side effects to FDA at 1-800-FDA-1088. Where should I keep my medication? This medication is given in a hospital or clinic. It will not be stored at home. NOTE: This sheet is a summary. It may not cover all possible information. If you have questions about this medicine, talk to your doctor, pharmacist, or health care provider.  2024 Elsevier/Gold Standard (2022-01-28 00:00:00)

## 2024-05-31 NOTE — Telephone Encounter (Signed)
 CRITICAL VALUE STICKER  CRITICAL VALUE:  K+  2.1  RECEIVER (on-site recipient of call): Redith Drach,RN  DATE & TIME NOTIFIED: 05/31/2024 @ 1150  MESSENGER (representative from lab): Luke Shine in lab  MD NOTIFIED: Dr Cornelius  TIME OF NOTIFICATION: 1155  RESPONSE:  Pt in clinic with Dr Cornelius.

## 2024-05-31 NOTE — Progress Notes (Signed)
 [facility]  [address] [place],  [place]  [address] [phone]  Clinic Day: 05/31/2024  Referring physician: Fleeta Valeria Mayo, MD  ASSESSMENT & PLAN:  Assessment: Essential thrombocytosis (HCC) Essential thrombocythemia, previously treated with hydroxyurea 500 mg 4 times daily. She developed leukopenia and anemia, so hydroxyurea was decreased to 3 times daily.  In April 2024, hydroxyurea was decreased to twice daily due to persistent leukopenia.  In June 2024, she had worsening leukopenia with the WBC  to 2.6, as well as worsening anemia.  Her platelets were normal at 274,000, so hydroxyurea was discontinued.  Her platelets have remained normal since that time.  She has had steady worsening of her leukopenia and anemia. 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 Hopefully this will not progress into leukemia.   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  In November 2023, this increased significantly to 3.8 cm in maximum diameter and appears to communicate with the main pancreatic duct. She was referred to gastroenterology in [place] but did not keep her appointment. She had a repeat MRI abdomen in May 2024 which revealed further enlargement of the pancreatic lesion to 4.5 cm. She has a strong family history for pancreatic cancer and another brother was referred to hospice because it was unresectable at the time of surgery.  She was re-referred to gastroenterology in [facility] for an EUS and saw Dr. Wilhelmenia. The EUS had to be rescheduled due to hypertension, but she was finally able to have EUS and biopsy of the pancreatic cyst in October. Cytology was negative.  She had a repeat MRI of the pancreas in June of 2025, and there was actually mild decrease in the cyst.    Leukopenia due to antineoplastic chemotherapy (HCC) Her white count is stable at 3.1 with an ANC 2300, which is stable.   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 Unfortunately, she is also at risk for development of acute leukemia due to her myeloproliferative disorder as well as the hydroxyurea.    Anemia Mild chronic anemia, which is likely secondary to hydroxyurea, which has been discontinued. This has worsened probably due to blood loss at this time. She will need evaluation by GI and gynecology as discussed with Dr. Fleeta Valeria. She did not mention bleeding today.  Acute and Chronic Strokes In the past this was due to her severe thrombocytosis but her platelet are under control now for the last 2 years. I explained this is more likely due to uncontrolled hypertension, diabetes, and hyperlipidemia. She is very non-compliant despite multiple discussions.    Family history of pancreatic cancer Two first-degree relatives, her brothers, with pancreatic cancer.  One brother declined genetic testing that we know of and he has expired. Another brother is on hospice, so cannot be tested.  Genetic testing has been recommended for the patient, but not done due to lack of insurance coverage. She reports there are multiple other relatives with pancreatic cancer also.   Hypokalemia This is severe and I explained it could be life threatening and recommended she go to the emergency room. She refuses so we will give her IV potassium here this afternoon, ideally 40 meq if she will stay the 4 hours. I told her this still would not have her potassium normal and she will need to follow-up with Dr. Fleeta Valeria in 1-2 weeks. I will schedule her to return  in 3 weeks and repeat her labs. Most likely, she will need IV potassium again. I told her she needs to be taking 2 pills TID, but I don't expect her to be compliant.   Plan:  She has been off hydroxyurea for over 1 year now with anemia and leukopenia. These are all slowly drifting down and the picture appears consistent with myelofibrosis. I am not seeing  transition to leukemia at this point, but I did explain that could happen. She is not well enough to take aggressive treatment so we will not pursue a bone marrow at this time. She also has a history of a large pancreatic cyst with mild elevation of the CA19-9 and strong family history for pancreatic cancer. She has even had an EUS and biopsy, but this was benign. Patient states that she feels poorly and complains of abdominal pain. She has been taking 20 meq potassium chloride  BID, with difficulty. She reports nausea and vomiting shortly after taking the supplements. She has a low WBC of 3.0 with an ANC of 2100 down from 3.2 with an ANC of 2200, and a low hemoglobin of 9.7 down from 10.0, and platelet count of 225,000. Her CMP revealed a critically low potassium of 2.1 down from 2.3,  a low total protein of 6.4 down from 6.6, an elevated AST of 42 up from 35, and elevated ALT of 45 up from 35, and a stable elevated total bilirubin of 1.4. I informed her of the risks associated with hypokalemia and she will receive IV potassium today. When we saw her last month, we gave her IV potassium, but she left after 20 meq IV. I will see her back in 3 weeks with CBC and CMP. We will plan an infusion appointment to possibly repeat IV potassium. I will ask Dr. Fleeta Stevens to see her in the next 1-2 weeks. The patient understands the plans discussed today and is in agreement with them.  She knows to contact our office if she develops concerns prior to her next appointment.  I provided 18 minutes of face-to-face time during this encounter and > 50% was spent counseling as documented under my assessment and plan.   Allison VEAR Cornish, MD  [facility] OF [facility] [address] [place] [address] Dept: [phone] Dept Fax: [phone]   No orders of the defined types were placed in this encounter.   CHIEF COMPLAINT:  CC: Essential  thrombocytosis  Current Treatment: Surveillance  HISTORY OF PRESENT ILLNESS:  Allison Stevens is a 58 y.o. female with a history of essential thrombocythemia originally diagnosed in January 2005.  She was treated with hydroxyurea, but was often non-compliant.  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  She has persistent pain in the left upper quadrant, attributed to splenomegaly, as well as bone pain, for which she had been on OxyContin  with oxycodone  for breakthrough.  She was subsequently tapered off OxyContin  and oxycodone  has been decreased.  We have been following a lesion in the pancreas seen incidentally on CT done in the emergency room for abdominal pain.  This has been felt to be a benign cyst.  Repeat MRI abdomen in November 2016, revealed a slight increase in the benign appearing cyst of the pancreas.  No splenomegaly was seen.  Repeat MRI abdomen in 1 year was recommended.  She was hospitalized in September 2017 and underwent colonoscopy with  removal of a small polyp in the sigmoid colon.  Small internal hemorrhoids were also seen.  Dr. Charlanne also obtained a CEA and CA 19-9, and. the CA 19-9 was elevated at 145, the CEA was normal. EGD in April 2018 showed mild gastritis, a small hiatal hernia, and a Schatzki ring.  She had esophageal dilation at that time.  MRI abdomen with contrast in September 2017 revealed a stable 14 mm cystic lesion in the pancreas.  Again, no splenomegaly was seen.  Follow-up MRI in 1 year was recommended. MRI abdomen in March 2019 revealed a 17 mm cystic lesion in the pancreatic uncinate process, which was felt to be stable.  No significant liver lesions were seen. Repeat MRI abdomen in 1 year was recommended.  MRI abdomen in September 2019 revealed a stable 19 mm cystic lesion of the pancreas, which appeared to be benign and still felt to be stable.  Repeat MRI in 1 year was recommended.  MRI abdomen in October 2020 revealed a  stable 17 mm cystic lesion of the pancreas. Continued yearly follow up MRI abdomen was recommended.  MRI abdomen in September 2021 revealed a 1.9 cm cystic lesion in the pancreatic uncinate process, which showed no significant change compared to more recent exams, but has shown a slow increase in size since 2016. 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 Follow-up by MRI in 2 years was recommended.    Screening mammogram in February 2016 revealed a possible mass in the right breast.  Right diagnostic mammogram and ultrasound was done and revealed 2 well circumscribed hypoechoic lesions in the right breast at 11 o 'clock, felt to represent cysts.  Repeat mammogram and ultrasound in August revealed heterogeneous fibroglandular tissue felt to be benign, with resolution of the previously seen cysts and no masses.  Repeat diagnostic bilateral mammogram and ultrasound in 6 months was once again recommended. Bilateral diagnostic mammogram in March 2017 did not reveal any evidence of malignancy, so 1 year follow-up screening mammogram was recommended.  Bilateral screening mammogram in March 2018 did not reveal any evidence of malignancy. Bone density scan in June 2018 revealed osteopenia with a T-score of -1.5 in the femur.  The patient was instructed to take calcium  and vitamin-D twice daily.   Bilateral screening mammogram in December 2020 did not reveal any evidence of malignancy.  Bilateral screening mammogram in November 2022 was negative.   The patient's family history is significant in that her father had pancreatic cancer at age 76, a paternal uncle had pancreatic cancer and a paternal aunt had pancreatic cancer.  Her brother also had pancreatic cancer at age 17.  She is appropriate for genetic testing, but this has not been done yet due to insurance coverage.   MRI abdomen in November 2023 revealed significant interval enlargement of a  thinly septated cystic lesion of the central pancreatic head, now measuring 3.8 x 3.0 cm, previously 2.5 x 2.0 cm. This appears to communicate with the adjacent main pancreatic duct, and as previously reported, this has been observed to grow very slowly over a long period of time on examinations dating back to at least 2015. 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 Given substantial interval growth and size and greater than 2.5 cm, recommend EUS/FNA for definitive diagnosis.  She was referred to gastroenterology in [facility] and was scheduled with Dr. Wilhelmenia in January 2024, but did not keep her appointment.  MRI abdomen in May 2024  revealed continued enlargement of a lobulated, thinly septated cystic lesion in the central pancreatic head now measuring 4.5 x 3.7 cm.  EUS/FNA was once again recommended.  She was referred back and saw Dr. Wilhelmenia.  Her EUS had to be delayed due to hypertension.  She finally had this done in October and cytology was negative.  She had colonoscopy in April 2024 with Misenheimer, but this was incomplete due to poor prep.  EGD revealed esophageal stricture which was dilated again.  Bilateral screening mammogram in May 2024 was negative.  Bilateral screening mammogram in May 2024 did not reveal any evidence of malignancy.  She had been on hydroxyurea 500 mg 4 times a day for many years.  Hydroxyurea was decreased to 3 times a day in September 2022, as her platelets were controlled and she was more anemic with a hemoglobin of 10. Repeat CBC in November 2022 revealed her hemoglobin to be back up to 11.6 and platelets remained in good range so we continued hydroxyurea 500 mg 3 times daily.  At some point, the patient started taking hydroxyurea 4 times daily.  She developed mild leukopenia in January 2024, so so we decreased hydroxyurea back to 3 times daily.  She had worsening neutropenia and anemia in May 2024, so we decreased hydroxyurea to once daily.  Since she had persistent  neutropenia and anemia, hydroxyurea was discontinued in July of 2024.  She had some improvement in the neutropenia with stable anemia and platelets normal, so she has remained off hydroxyurea.  INTERVAL HISTORY:  Allison Stevens is here today for repeat clinical assessment for her essential thrombocytosis. She has been off hydroxyurea for over 1 year now with anemia and leukopenia. These are all slowly drifting down and the picture appears consistent with myelofibrosis. I am not seeing transition to leukemia at this point, but I did explain that could happen. She is not well enough to take aggressive treatment so we will not pursue a bone marrow at this time. She also has a history of a large pancreatic cyst with mild elevation of the CA19-9 and strong family history for pancreatic cancer. She has even had an EUS and biopsy, but this was benign. Patient states that she feels poorly and complains of abdominal pain. She has been taking 20 meq potassium chloride  BID, with difficulty. She reports nausea and vomiting shortly after taking the supplements. She has a low WBC of 3.0 with an ANC of 2100 down from 3.2 with an ANC of 2200, and a low hemoglobin of 9.7 down from 10.0, and platelet count of 225,000. Her CMP revealed a critically low potassium of 2.1 down from 2.3,  a low total protein of 6.4 down from 6.6, an elevated AST of 42 up from 35, and elevated ALT of 45 up from 35, and a stable elevated total bilirubin of 1.4. I informed her of the risks associated with hypokalemia and she will receive IV potassium today. When we saw her last month, we gave her IV potassium, but she left after 20 meq IV. I will see her back in 3 weeks with CBC and CMP. We will plan an infusion appointment to possibly repeat IV potassium. I will ask Dr. Fleeta Stevens to see her in the next 1-2 weeks. She denies fever, chills, night sweats, or other signs of infection. She denies cardiorespiratory and gastrointestinal issues. She  denies pain. Her  appetite is okay and Her weight was not taken today. She was 130lb as of 05/15/2024.  REVIEW  OF SYSTEMS:  Review of Systems  Constitutional:  Positive for appetite change (poor) and fatigue. Negative for chills, diaphoresis, fever and unexpected weight change.  HENT:  Negative.  Negative for lump/mass, mouth sores, sore throat and trouble swallowing.   Eyes: Negative.   Respiratory: Negative.  Negative for chest tightness, cough, hemoptysis, shortness of breath and wheezing.   Cardiovascular: Negative.  Negative for chest pain, leg swelling and palpitations.  Gastrointestinal:  Positive for abdominal pain (left side, 7/10), nausea (severe) and vomiting. Negative for abdominal distention, blood in stool, constipation and diarrhea.  Endocrine: Negative.  Negative for hot flashes.  Genitourinary:  Negative for difficulty urinating, dysuria, frequency, hematuria and vaginal bleeding.   Musculoskeletal:  Positive for gait problem (since stroke, uses walker). Negative for arthralgias, back pain, flank pain, myalgias and neck pain.  Skin: Negative.  Negative for itching and rash.  Neurological:  Positive for extremity weakness (since stroke), gait problem (since stroke, uses walker) and speech difficulty (slurred speech). Negative for dizziness, headaches, light-headedness, numbness and seizures.  Hematological: Negative.  Negative for adenopathy. Does not bruise/bleed easily.  Psychiatric/Behavioral:  Positive for depression. Negative for sleep disturbance. The patient is not nervous/anxious.     VITALS:  Blood pressure (!) 165/76, pulse 66, temperature 97.9 F (36.6 C), temperature source Oral, resp. rate 18, height 5' 4 (1.626 m), SpO2 100%.  Wt Readings from Last 3 Encounters:  05/15/24 130 lb (59 kg)  04/17/24 130 lb (59 kg)  01/24/24 139 lb 8 oz (63.3 kg)    Body mass index is 22.31 kg/m.  Performance status (ECOG): 2 - Symptomatic, <50% confined to bed  PHYSICAL EXAM:  Physical  Exam Vitals and nursing note reviewed.  Constitutional:      General: She is not in acute distress.    Appearance: She is ill-appearing (chronically ill appearing).  HENT:     Head: Normocephalic and atraumatic.     Right Ear: Tympanic membrane, ear canal and external ear normal.     Left Ear: Tympanic membrane, ear canal and external ear normal.     Nose: Nose normal.     Mouth/Throat:     Mouth: Mucous membranes are moist.     Pharynx: Oropharynx is clear. No oropharyngeal exudate or posterior oropharyngeal erythema.  Eyes:     General: No scleral icterus.    Extraocular Movements: Extraocular movements intact.     Conjunctiva/sclera: Conjunctivae normal.     Pupils: Pupils are equal, round, and reactive to light.  Cardiovascular:     Rate and Rhythm: Normal rate and regular rhythm.     Pulses: Normal pulses.     Heart sounds: Normal heart sounds. No murmur heard.    No friction rub. No gallop.  Pulmonary:     Effort: Pulmonary effort is normal.     Breath sounds: Normal breath sounds. No wheezing, rhonchi or rales.  Abdominal:     General: There is no distension.     Palpations: Abdomen is soft. There is no hepatomegaly, splenomegaly or mass.     Tenderness: There is no abdominal tenderness.  Musculoskeletal:        General: Normal range of motion.     Cervical back: Normal range of motion and neck supple. No tenderness.     Right lower leg: Edema (mild) present.     Left lower leg: Edema (mild) present.  Lymphadenopathy:     Cervical: No cervical adenopathy.     Upper Body:  Right upper body: No supraclavicular or axillary adenopathy.     Left upper body: No supraclavicular or axillary adenopathy.     Lower Body: No right inguinal adenopathy. No left inguinal adenopathy.  Skin:    General: Skin is warm and dry.     Coloration: Skin is not jaundiced.     Findings: No rash.  Neurological:     Mental Status: She is oriented to person, place, and time. She is  lethargic.     Cranial Nerves: Dysarthria present. No cranial nerve deficit.     Gait: Gait abnormal.     Comments: Right hemiparesis but good hand grip bilaterally   Psychiatric:        Mood and Affect: Mood normal.        Speech: Speech is delayed and slurred.        Behavior: Behavior is slowed.        Thought Content: Thought content normal.        Cognition and Memory: Cognition is impaired. Memory is impaired.        Judgment: Judgment is inappropriate.     LABS:      Latest Ref Rng & Units 06/13/2024    9:55 AM 05/31/2024   11:09 AM 04/16/2024   10:10 AM  CBC  WBC 4.0 - 10.5 K/uL 2.8  3.0  3.2   Hemoglobin 12.0 - 15.0 g/dL 9.3  9.7  89.9   Hematocrit 36.0 - 46.0 % 27.4  27.8  29.1   Platelets 150 - 400 K/uL 269  225  251       Latest Ref Rng & Units 06/13/2024    9:55 AM 05/31/2024   11:09 AM 05/15/2024    4:16 PM  CMP  Glucose 70 - 99 mg/dL 761  825  75   BUN 6 - 20 mg/dL 8  6  9    Creatinine 0.44 - 1.00 mg/dL 9.31  9.27  9.35   Sodium 135 - 145 mmol/L 144  143  148   Potassium 3.5 - 5.1 mmol/L 2.3  2.1  2.3   Chloride 98 - 111 mmol/L 96  93  90   CO2 22 - 32 mmol/L 39  40  35   Calcium  8.9 - 10.3 mg/dL 9.4  9.0  9.4   Total Protein 6.5 - 8.1 g/dL 6.1  6.4  6.6   Total Bilirubin 0.0 - 1.2 mg/dL 0.6  1.4  1.4   Alkaline Phos 38 - 126 U/L 71  70  81   AST 15 - 41 U/L 43  42  35   ALT 0 - 44 U/L 44  45  35    Lab Results  Component Value Date   TSH 3.380 08/08/2022   Lab Results  Component Value Date   CEA1 1.9 11/16/2022   /  CEA  Date Value Ref Range Status  11/16/2022 1.9 0.0 - 4.7 ng/mL Final    Comment:    (NOTE)                             Nonsmokers          <3.9                             Smokers             <5.6 [employer] Electrochemiluminescence Immunoassay (ECLIA) Values obtained  with different assay methods or kits cannot be used interchangeably.  Results cannot be interpreted as absolute evidence of the presence or absence  of malignant disease. Performed At: [facility] [address] [place], [place] [id number] Jennette Shorter MD Ey:[id number]    No results found for: PSA1 Lab Results  Component Value Date   RJW800 58 (H) 04/16/2024   Lab Results  Component Value Date   TIBC 358 01/04/2024   TIBC 259 08/08/2022   TIBC 301 11/09/2021   FERRITIN 58 01/04/2024   FERRITIN 134 08/08/2022   FERRITIN 59 11/09/2021   IRONPCTSAT 22 01/04/2024   IRONPCTSAT 42 08/08/2022   IRONPCTSAT 26 11/09/2021     STUDIES:  EXAM: 04/03/2024 CT HEAD WITHOUT CONTRAST IMPRESSION: 1. No acute intracranial abnormality. 2. Remote lacunar infarcts in the bilateral basal ganglia and right thalamus. Right thalamic infarct is new since 2024. 3. Moderate chronic microvascular ischemic changes. 4. Generalized parenchymal volume loss, mild cerebellar volume loss, and brainstem atrophy, similar to prior MRI.  EXAM: 01/04/2024 DIGITAL SCREENING BILATERAL MAMMOGRAM WITH TOMOSYNTHESIS AND CAD IMPRESSION: No mammographic evidence of malignancy.  EXAM: 01/04/2024 MRI ABDOMEN WITHOUT AND WITH CONTRAST (INCLUDING MRCP) IMPRESSION: 1. No significant change in a lobulated, thinly septated cystic lesion in the central pancreatic head, measuring 4.5 x 3.7 cm. No solid component or suspicious contrast enhancement. No pancreatic ductal dilatation or surrounding inflammatory changes. 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 As previously reported, consider EUS/FNA for definitive diagnosis if not previously performed, and recommend ongoing imaging surveillance given size. 2. Status post cholecystectomy. Unchanged postoperative biliary ductal dilatation. HISTORY:   Past Medical History:  Diagnosis Date   Acid reflux    Anxiety    Carpal tunnel syndrome    Cerebrovascular disease    Diabetic retinopathy (HCC)    Family history of pancreatic cancer 08/09/2021   Gait  abnormality    History of stroke    Hyperlipidemia    Hypertension    Migraine    Pancreatic cyst 08/09/2021   Retinal detachment    Thrombocytosis    Trigger Stevens    Ulnar nerve entrapment     Past Surgical History:  Procedure Laterality Date   BIOPSY  05/11/2023   Procedure: BIOPSY;  Surgeon: Allison Stevens Aloha Raddle., MD;  Location: [facility];  Service: Gastroenterology;;   CESAREAN SECTION  1997   ESOPHAGOGASTRODUODENOSCOPY N/A 05/11/2023   Procedure: ESOPHAGOGASTRODUODENOSCOPY (EGD);  Surgeon: Allison Stevens Aloha Raddle., MD;  Location: [facility];  Service: Gastroenterology;  Laterality: N/A;   EUS N/A 05/11/2023   Procedure: UPPER ENDOSCOPIC ULTRASOUND (EUS) RADIAL;  Surgeon: Allison Stevens Aloha Raddle., MD;  Location: [facility];  Service: Gastroenterology;  Laterality: N/A;   FINE NEEDLE ASPIRATION N/A 05/11/2023   Procedure: FINE NEEDLE ASPIRATION (FNA) LINEAR;  Surgeon: Allison Stevens Aloha Raddle., MD;  Location: [facility];  Service: Gastroenterology;  Laterality: N/A;   GALLBLADDER SURGERY  2011    Family History  Problem Relation Age of Onset   Diabetes Mother    Cancer Mother    Pancreatic cancer Father    Pancreatic cancer Brother    Pancreatic cancer Brother    Healthy Child    Pancreatic cancer Paternal Uncle     Social History:  reports that she has never smoked. She has never used smokeless tobacco. She reports that she does not drink alcohol  and does not use drugs.The patient is alone today.  Allergies:  Allergies  Allergen Reactions   Iodinated Contrast  Media Other (See Comments) and Shortness Of Breath   Morphine  Itching and Other (See Comments)    Makes me feel out of my mind per pt.    Current Medications: Current Outpatient Medications  Medication Sig Dispense Refill   ALPRAZolam  (XANAX ) 0.5 MG tablet Take 1 tablet (0.5 mg total) by mouth every 8 (eight) hours as needed for anxiety. 90 tablet 2   amLODipine  (NORVASC ) 10 MG tablet Take 1  tablet (10 mg total) by mouth daily. 90 tablet 1   aspirin EC 81 MG tablet Take 81 mg by mouth daily. Swallow whole.     BD PEN NEEDLE NANO 2ND GEN 32G X 4 MM MISC CHECK BLOOD SUGAR THREE TIMES A DAY 100 each 3   clopidogrel  (PLAVIX ) 75 MG tablet Take 1 tablet (75 mg total) by mouth daily. 90 tablet 1   fluticasone  (FLONASE ) 50 MCG/ACT nasal spray PLACE 1 SPRAY INTO BOTH NOSTRILS IN THE MORNING AND AT BEDTIME. 48 mL 1   fluticasone  (FLOVENT  HFA) 44 MCG/ACT inhaler      hydrochlorothiazide  (HYDRODIURIL ) 25 MG tablet Take 1 tablet (25 mg total) by mouth daily. 90 tablet 1   insulin  aspart (NOVOLOG  FLEXPEN) 100 UNIT/ML FlexPen Inject 35 Units into the skin 3 (three) times daily with meals. 15 mL 11   insulin  glargine (LANTUS) 100 UNIT/ML Solostar Pen Inject 80 Units into the skin daily. 15 mL 1   lisinopril  (ZESTRIL ) 40 MG tablet Take 1 tablet (40 mg total) by mouth daily. 90 tablet 1   metFORMIN  (GLUCOPHAGE ) 1000 MG tablet TAKE 1 TABLET (1,000 MG TOTAL) BY MOUTH TWICE A DAY WITH FOOD 180 tablet 0   naloxone  (NARCAN ) nasal spray 4 mg/0.1 mL As directed 2 each 1   ondansetron  (ZOFRAN ) 4 MG tablet Take 1 tablet (4 mg total) by mouth every 8 (eight) hours as needed for nausea or vomiting. 20 tablet 0   potassium chloride  (KLOR-CON  M) 10 MEQ tablet Take 2 tablets (20 mEq total) by mouth 2 (two) times daily. 60 tablet 0   prochlorperazine (COMPAZINE) 10 MG tablet Take 10 mg by mouth every 6 (six) hours as needed for nausea or vomiting.     RABEprazole  (ACIPHEX ) 20 MG tablet Take 1 tablet (20 mg total) by mouth daily. 90 tablet 0   rosuvastatin  (CRESTOR ) 20 MG tablet Take 1 tablet (20 mg total) by mouth daily. 30 tablet 11   traMADol (ULTRAM) 50 MG tablet Take 1 tablet (50 mg total) by mouth every 6 (six) hours as needed. 30 tablet 0   No current facility-administered medications for this visit.   Facility-Administered Medications Ordered in Other Visits  Medication Dose Route Frequency Provider Last  Rate Last Admin   0.9 %  sodium chloride  infusion   Intravenous Continuous Harl Eleanor LABOR, NP   Stopped at 06/13/24 1235    I,Gaylene Moylan H Zoriyah Scheidegger,acting as a neurosurgeon for Allison VEAR Cornish, MD.,have documented all relevant documentation on the behalf of Allison VEAR Cornish, MD,as directed by  Allison VEAR Cornish, MD while in the presence of Allison VEAR Cornish, MD.  I have reviewed this report as typed by the medical scribe, and it is complete and accurate.

## 2024-05-31 NOTE — Telephone Encounter (Signed)
 Pt to receive IV potassium in infusion room today.

## 2024-06-03 ENCOUNTER — Telehealth: Payer: Self-pay

## 2024-06-03 NOTE — Telephone Encounter (Signed)
 Pt states the pain medication we gave her isn't working. She wants to come in and see Dr Cornelius.

## 2024-06-04 DIAGNOSIS — I10 Essential (primary) hypertension: Secondary | ICD-10-CM | POA: Diagnosis not present

## 2024-06-04 DIAGNOSIS — G43909 Migraine, unspecified, not intractable, without status migrainosus: Secondary | ICD-10-CM | POA: Diagnosis not present

## 2024-06-04 DIAGNOSIS — I69354 Hemiplegia and hemiparesis following cerebral infarction affecting left non-dominant side: Secondary | ICD-10-CM | POA: Diagnosis not present

## 2024-06-04 DIAGNOSIS — E1165 Type 2 diabetes mellitus with hyperglycemia: Secondary | ICD-10-CM | POA: Diagnosis not present

## 2024-06-04 DIAGNOSIS — F419 Anxiety disorder, unspecified: Secondary | ICD-10-CM | POA: Diagnosis not present

## 2024-06-04 DIAGNOSIS — E11319 Type 2 diabetes mellitus with unspecified diabetic retinopathy without macular edema: Secondary | ICD-10-CM | POA: Diagnosis not present

## 2024-06-05 DIAGNOSIS — I69354 Hemiplegia and hemiparesis following cerebral infarction affecting left non-dominant side: Secondary | ICD-10-CM | POA: Diagnosis not present

## 2024-06-05 DIAGNOSIS — E1165 Type 2 diabetes mellitus with hyperglycemia: Secondary | ICD-10-CM | POA: Diagnosis not present

## 2024-06-05 DIAGNOSIS — F419 Anxiety disorder, unspecified: Secondary | ICD-10-CM | POA: Diagnosis not present

## 2024-06-05 DIAGNOSIS — E11319 Type 2 diabetes mellitus with unspecified diabetic retinopathy without macular edema: Secondary | ICD-10-CM | POA: Diagnosis not present

## 2024-06-05 DIAGNOSIS — I10 Essential (primary) hypertension: Secondary | ICD-10-CM | POA: Diagnosis not present

## 2024-06-05 DIAGNOSIS — G43909 Migraine, unspecified, not intractable, without status migrainosus: Secondary | ICD-10-CM | POA: Diagnosis not present

## 2024-06-06 ENCOUNTER — Other Ambulatory Visit: Payer: Self-pay | Admitting: Hematology and Oncology

## 2024-06-06 DIAGNOSIS — E11319 Type 2 diabetes mellitus with unspecified diabetic retinopathy without macular edema: Secondary | ICD-10-CM | POA: Diagnosis not present

## 2024-06-06 DIAGNOSIS — E1165 Type 2 diabetes mellitus with hyperglycemia: Secondary | ICD-10-CM | POA: Diagnosis not present

## 2024-06-06 DIAGNOSIS — G43909 Migraine, unspecified, not intractable, without status migrainosus: Secondary | ICD-10-CM | POA: Diagnosis not present

## 2024-06-06 DIAGNOSIS — I10 Essential (primary) hypertension: Secondary | ICD-10-CM | POA: Diagnosis not present

## 2024-06-06 DIAGNOSIS — I69354 Hemiplegia and hemiparesis following cerebral infarction affecting left non-dominant side: Secondary | ICD-10-CM | POA: Diagnosis not present

## 2024-06-06 DIAGNOSIS — F419 Anxiety disorder, unspecified: Secondary | ICD-10-CM | POA: Diagnosis not present

## 2024-06-06 MED ORDER — TRAMADOL HCL 50 MG PO TABS: 50.0000 mg | ORAL_TABLET | Freq: Four times a day (QID) | ORAL | 0 refills | Status: DC | PRN

## 2024-06-07 ENCOUNTER — Encounter: Payer: Self-pay | Admitting: Oncology

## 2024-06-10 ENCOUNTER — Ambulatory Visit: Admitting: Internal Medicine

## 2024-06-12 ENCOUNTER — Ambulatory Visit: Admitting: Internal Medicine

## 2024-06-13 ENCOUNTER — Inpatient Hospital Stay

## 2024-06-13 ENCOUNTER — Other Ambulatory Visit: Payer: Self-pay

## 2024-06-13 ENCOUNTER — Inpatient Hospital Stay: Attending: Oncology

## 2024-06-13 ENCOUNTER — Encounter: Payer: Self-pay | Admitting: Oncology

## 2024-06-13 ENCOUNTER — Other Ambulatory Visit: Payer: Self-pay | Admitting: Hematology and Oncology

## 2024-06-13 ENCOUNTER — Inpatient Hospital Stay (HOSPITAL_BASED_OUTPATIENT_CLINIC_OR_DEPARTMENT_OTHER): Admitting: Hematology and Oncology

## 2024-06-13 ENCOUNTER — Telehealth: Payer: Self-pay

## 2024-06-13 DIAGNOSIS — E876 Hypokalemia: Secondary | ICD-10-CM | POA: Diagnosis not present

## 2024-06-13 DIAGNOSIS — D473 Essential (hemorrhagic) thrombocythemia: Secondary | ICD-10-CM | POA: Insufficient documentation

## 2024-06-13 DIAGNOSIS — D701 Agranulocytosis secondary to cancer chemotherapy: Secondary | ICD-10-CM | POA: Diagnosis not present

## 2024-06-13 DIAGNOSIS — D649 Anemia, unspecified: Secondary | ICD-10-CM | POA: Insufficient documentation

## 2024-06-13 LAB — CBC WITH DIFFERENTIAL (CANCER CENTER ONLY)
Abs Immature Granulocytes: 0.01 K/uL (ref 0.00–0.07)
Basophils Absolute: 0 K/uL (ref 0.0–0.1)
Basophils Relative: 0 %
Eosinophils Absolute: 0 K/uL (ref 0.0–0.5)
Eosinophils Relative: 1 %
HCT: 27.4 % — ABNORMAL LOW (ref 36.0–46.0)
Hemoglobin: 9.3 g/dL — ABNORMAL LOW (ref 12.0–15.0)
Immature Granulocytes: 0 %
Lymphocytes Relative: 23 %
Lymphs Abs: 0.7 K/uL (ref 0.7–4.0)
MCH: 31.6 pg (ref 26.0–34.0)
MCHC: 33.9 g/dL (ref 30.0–36.0)
MCV: 93.2 fL (ref 80.0–100.0)
Monocytes Absolute: 0.2 K/uL (ref 0.1–1.0)
Monocytes Relative: 6 %
Neutro Abs: 2 K/uL (ref 1.7–7.7)
Neutrophils Relative %: 70 %
Platelet Count: 269 K/uL (ref 150–400)
RBC: 2.94 MIL/uL — ABNORMAL LOW (ref 3.87–5.11)
RDW: 15.9 % — ABNORMAL HIGH (ref 11.5–15.5)
WBC Count: 2.8 K/uL — ABNORMAL LOW (ref 4.0–10.5)
nRBC: 0 % (ref 0.0–0.2)

## 2024-06-13 LAB — CMP (CANCER CENTER ONLY)
ALT: 44 U/L (ref 0–44)
AST: 43 U/L — ABNORMAL HIGH (ref 15–41)
Albumin: 3.5 g/dL (ref 3.5–5.0)
Alkaline Phosphatase: 71 U/L (ref 38–126)
Anion gap: 10 (ref 5–15)
BUN: 8 mg/dL (ref 6–20)
CO2: 39 mmol/L — ABNORMAL HIGH (ref 22–32)
Calcium: 9.4 mg/dL (ref 8.9–10.3)
Chloride: 96 mmol/L — ABNORMAL LOW (ref 98–111)
Creatinine: 0.68 mg/dL (ref 0.44–1.00)
GFR, Estimated: >60 mL/min (ref >60)
Glucose, Bld: 238 mg/dL — ABNORMAL HIGH (ref 70–99)
Potassium: 2.3 mmol/L — CL (ref 3.5–5.1)
Sodium: 144 mmol/L (ref 135–145)
Total Bilirubin: 0.6 mg/dL (ref 0.0–1.2)
Total Protein: 6.1 g/dL — ABNORMAL LOW (ref 6.5–8.1)

## 2024-06-13 MED ORDER — POTASSIUM CHLORIDE 10 MEQ/100ML IV SOLN
10.0000 meq | INTRAVENOUS | Status: AC
  Administered 2024-06-13: 10 meq via INTRAVENOUS
  Filled 2024-06-13: qty 100

## 2024-06-13 MED ORDER — SODIUM CHLORIDE 0.9 % IV SOLN: INTRAVENOUS | Status: DC

## 2024-06-13 MED ORDER — POTASSIUM CHLORIDE CRYS ER 10 MEQ PO TBCR: 20.0000 meq | EXTENDED_RELEASE_TABLET | Freq: Two times a day (BID) | ORAL | 0 refills | Status: AC

## 2024-06-13 NOTE — Progress Notes (Signed)
 [facility]  [address] [place],  [place]  [address] [phone]  Clinic Day: 05/31/2024  Referring physician: Fleeta Valeria Mayo, MD  ASSESSMENT & PLAN:  Assessment: Essential thrombocytosis (HCC) Essential thrombocythemia, previously treated with hydroxyurea 500 mg 4 times daily. She developed leukopenia and anemia, so hydroxyurea was decreased to 3 times daily.  In April 2024, hydroxyurea was decreased to twice daily due to persistent leukopenia.  In June 2024, she had worsening leukopenia with the WBC  to 2.6, as well as worsening anemia.  Her platelets were normal at 274,000, so hydroxyurea was discontinued.  Her platelets have remained normal since that time.  She has had steady worsening of her leukopenia and anemia. 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  In November 2023, this increased significantly to 3.8 cm in maximum diameter and appears to communicate with the main pancreatic duct. She was gastroenterology in [place] but did not keep her appointment, she had a repeat MRI abdomen in May 2024 which revealed further enlargement of the pancreatic lesion to 4.5 cm. She has a strong family history for pancreatic cancer and another brother was referred to hospice because it was unresectable at the time of surgery.  She was re-referred to gastroenterology in [facility] for an EUS and saw Dr. Wilhelmenia. The EUS had to be rescheduled due to hypertension, but she was finally able to have EUS and biopsy of the pancreatic cyst in October. Cytology was negative.  She had a repeat MRI of the pancreas in June of 2025, and there was actually mild decrease in the cyst.    Leukopenia due to antineoplastic chemotherapy (HCC) Her white count is stable at 3.1 with an ANC 2300, which is stable.  Her hydroxyurea has been stopped and I think she is evolving  into myelofibrosis. Unfortunately, she is also at risk for development of acute leukemia due to her myeloproliferative disorder as well as the hydroxyurea.    Anemia Mild chronic anemia, which is likely secondary to hydroxyurea, which has been discontinued. This has worsened probably due to blood loss at this time. She will need evaluation by GI and gynecology as discussed with Dr. Fleeta Valeria. She did not mention bleeding today.  Acute and Chronic Strokes In the past this was due to her severe thrombocytosis but her platelet are under control now for the last 2 years. I explained this is more likely due to uncontrolled hypertension, diabetes, and hyperlipidemia. She is very non-compliant despite multiple discussions.    Family history of pancreatic cancer Two first-degree relatives, her brothers, with pancreatic cancer.  One brother declined genetic testing that we know of and he has expired. Another brother is on hospice, so cannot be tested.  Genetic testing has been recommended for the patient, but not done due to lack of insurance coverage. She reports are multiple other relatives with pancreatic cancer also.   Hypokalemia This is severe and I explained it could be life threatening and recommended she go to the emergency room. We will plan for at least 20 mEq infusion today. I will schedule her to return in 3 weeks and repeat her labs. Most likely, she will need IV potassium again. I told her she needs to be taking 2 pills TID, but I don't expect her to be compliant.   Plan:  She has been off hydroxyurea for over  1 year now with anemia and leukopenia. These are all slowly drifting down and the picture appears consistent with myelofibrosis. I am not seeing transition to leukemia at this point, but I did explain that could happen. She is not well enough to take aggressive treatment so we will not pursue a bone marrow at this time. She also has a history of a large pancreatic cyst with mild elevation of  the CA19-9 and strong family history for pancreatic cancer. She has even had an EUS and biopsy, but this was benign. Patient states that she feels poorly and complains of abdominal pain. I will ask Dr. Fleeta Finger to see her in the next 1-2 weeks. The patient understands the plans discussed today and is in agreement with them.  She knows to contact our office if she develops concerns prior to her next appointment.  I provided 20 minutes of face-to-face time during this encounter and > 50% was spent counseling as documented under my assessment and plan.  [name], FNP- [facility] - A DEPT OF [facility] [address] [place] [address] Dept: [phone] Dept Fax: [phone]   No orders of the defined types were placed in this encounter.   CHIEF COMPLAINT:  CC: Essential thrombocytosis  Current Treatment: Surveillance  HISTORY OF PRESENT ILLNESS:  Allison Stevens is a 58 y.o. female with a history of essential thrombocythemia originally diagnosed in January 2005.  She was treated with hydroxyurea, but was often non-compliant.  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  She has persistent pain in the left upper quadrant, attributed to splenomegaly, as well as bone pain, for which she had been on OxyContin  with oxycodone  for breakthrough.  She was subsequently tapered off OxyContin  and oxycodone  has been decreased.  We have been following a lesion in the pancreas seen incidentally on CT done in the emergency room for abdominal pain.  This has been felt to be a benign cyst.  Repeat MRI abdomen in November 2016, revealed a slight increase in the benign appearing cyst of the pancreas.  No splenomegaly was seen.  Repeat MRI abdomen in 1 year was recommended.  She was hospitalized in September 2017 and underwent colonoscopy with removal of a small polyp in the sigmoid colon.  Small internal  hemorrhoids were also seen.  Dr. Charlanne also obtained a CEA and CA 19-9, and. the CA 19-9 was elevated at 145, the CEA was normal. EGD in April 2018 showed mild gastritis, a small hiatal hernia, and a Schatzki ring.  She had esophageal dilation at that time.  MRI abdomen with contrast in September 2017 revealed a stable 14 mm cystic lesion in the pancreas.  Again, no splenomegaly was seen.  Follow-up MRI in 1 year was recommended. MRI abdomen in March 2019 revealed a 17 mm cystic lesion in the pancreatic uncinate process, which was felt to be stable.  No significant liver lesions were seen. Repeat MRI abdomen in 1 year was recommended.  MRI abdomen in September 2019 revealed a stable 19 mm cystic lesion of the pancreas, which appeared to be benign and still felt to be stable.  Repeat MRI in 1 year was recommended.  MRI abdomen in October 2020 revealed a stable 17 mm cystic lesion of the pancreas. Continued yearly follow up MRI abdomen was recommended.  MRI abdomen in September 2021 revealed a 1.9 cm cystic lesion in the pancreatic uncinate  process, which showed no significant change compared to more recent exams, but has shown a slow increase in size since 2016. 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 Follow-up by MRI in 2 years was recommended.    Screening mammogram in February 2016 revealed a possible mass in the right breast.  Right diagnostic mammogram and ultrasound was done and revealed 2 well circumscribed hypoechoic lesions in the right breast at 11 o 'clock, felt to represent cysts.  Repeat mammogram and ultrasound in August revealed heterogeneous fibroglandular tissue felt to be benign, with resolution of the previously seen cysts and no masses.  Repeat diagnostic bilateral mammogram and ultrasound in 6 months was once again recommended. Bilateral diagnostic mammogram in March 2017 did not reveal any evidence of malignancy, so  1 year follow-up screening mammogram was recommended.  Bilateral screening mammogram in March 2018 did not reveal any evidence of malignancy. Bone density scan in June 2018 revealed osteopenia with a T-score of -1.5 in the femur.  The patient was instructed to take calcium  and vitamin-D twice daily.   Bilateral screening mammogram in December 2020 did not reveal any evidence of malignancy.  Bilateral screening mammogram in November 2022 was negative.   The patient's family history is significant in that her father had pancreatic cancer at age 64, a paternal uncle had pancreatic cancer and a paternal aunt had pancreatic cancer.  Her brother also had pancreatic cancer at age 62.  She is appropriate for genetic testing, but this has not been done yet due to insurance coverage.   MRI abdomen in November 2023 revealed significant interval enlargement of a thinly septated cystic lesion of the central pancreatic head, now measuring 3.8 x 3.0 cm, previously 2.5 x 2.0 cm. This appears to communicate with the adjacent main pancreatic duct, and as previously reported, this has been observed to grow very slowly over a long period of time on examinations dating back to at least 2015. 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 Given substantial interval growth and size and greater than 2.5 cm, recommend EUS/FNA for definitive diagnosis.  She was referred to gastroenterology in [facility] and was scheduled with Dr. Wilhelmenia in January 2024, but did not keep her appointment.  MRI abdomen in May 2024 revealed continued enlargement of a lobulated, thinly septated cystic lesion in the central pancreatic head now measuring 4.5 x 3.7 cm.  EUS/FNA was once again recommended.  She was referred back and saw Dr. Wilhelmenia.  Her EUS had to be delayed due to hypertension.  She finally had this done in October and cytology was negative.  She had colonoscopy in April 2024 with Misenheimer, but this was incomplete due to poor prep.  EGD  revealed esophageal stricture which was dilated again.  Bilateral screening mammogram in May 2024 was negative.  Bilateral screening mammogram in May 2024 did not reveal any evidence of malignancy.  She had been on hydroxyurea 500 mg 4 times a day for many years.  Hydroxyurea was decreased to 3 times a day in September 2022, as her platelets were controlled and she was more anemic with a hemoglobin of 10. Repeat CBC in November 2022 revealed her hemoglobin to be back up to 11.6 and platelets remained in good range so we continued hydroxyurea 500 mg 3 times daily.  At some point, the patient started taking hydroxyurea 4 times daily.  She developed mild leukopenia in January 2024, so so we decreased hydroxyurea back to  3 times daily.  She had worsening neutropenia and anemia in May 2024, so we decreased hydroxyurea to once daily.  Since she had persistent neutropenia and anemia, hydroxyurea was discontinued in July of 2024.  She had some improvement in the neutropenia with stable anemia and platelets normal, so she has remained off hydroxyurea.  INTERVAL HISTORY:  Allison Stevens is here today for repeat clinical assessment for her essential thrombocytosis. She has been off hydroxyurea for over 1 year now with anemia and leukopenia. These are all slowly drifting down and the picture appears consistent with myelofibrosis. I am not seeing transition to leukemia at this point, but I did explain that could happen. She is not well enough to take aggressive treatment so we will not pursue a bone marrow at this time. She also has a history of a large pancreatic cyst with mild elevation of the CA19-9 and strong family history for pancreatic cancer. She has even had an EUS and biopsy, but this was benign. Patient states that she feels poorly and complains of abdominal pain. She has been taking 20 meq potassium chloride  BID, with difficulty. She reports nausea and vomiting shortly after taking the supplements. She has a low WBC of  3.0 with an ANC of 2100 down from 3.2 with an ANC of 2200, and a low hemoglobin of 9.7 down from 10.0, and platelet count of 225,000. Her CMP revealed a critically low potassium of 2.1 down from 2.3,  a low total protein of 6.4 down from 6.6, an elevated AST of 42 up from 35, and elevated ALT of 45 up from 35, and a stable elevated total bilirubin of 1.4. I informed her of the risks associated with hypokalemia and she will receive IV potassium today. When we saw her last month, we gave her IV potassium, but she left after 20 meq IV. I will see her back in 3 weeks with CBC and CMP. We will plan an infusion appointment to possibly repeat IV potassium. I will ask Dr. Fleeta Finger to see her in the next 1-2 weeks.  She denies fever, chills, night sweats, or other signs of infection. She denies cardiorespiratory and gastrointestinal issues. She  denies pain. Her appetite is okay and Her weight was not taken today. She was 130lb as of 05/15/2024.  REVIEW OF SYSTEMS:  Review of Systems  Constitutional:  Positive for appetite change (poor) and fatigue. Negative for chills, diaphoresis, fever and unexpected weight change.  HENT:  Negative.  Negative for lump/mass, mouth sores, sore throat and trouble swallowing.   Eyes: Negative.   Respiratory: Negative.  Negative for chest tightness, cough, hemoptysis, shortness of breath and wheezing.   Cardiovascular: Negative.  Negative for chest pain, leg swelling and palpitations.  Gastrointestinal:  Positive for abdominal pain (left side, 7/10), nausea (severe) and vomiting. Negative for abdominal distention, blood in stool, constipation and diarrhea.  Endocrine: Negative.  Negative for hot flashes.  Genitourinary:  Negative for difficulty urinating, dysuria, frequency, hematuria and vaginal bleeding.   Musculoskeletal:  Positive for gait problem (since stroke, uses walker). Negative for arthralgias, back pain, flank pain, myalgias and neck pain.  Skin: Negative.  Negative  for itching and rash.  Neurological:  Positive for extremity weakness (since stroke), gait problem (since stroke, uses walker) and speech difficulty (slurred speech). Negative for dizziness, headaches, light-headedness, numbness and seizures.  Hematological: Negative.  Negative for adenopathy. Does not bruise/bleed easily.  Psychiatric/Behavioral:  Positive for depression. Negative for sleep disturbance. The patient is not nervous/anxious.  VITALS:  There were no vitals taken for this visit.  Wt Readings from Last 3 Encounters:  05/15/24 130 lb (59 kg)  04/17/24 130 lb (59 kg)  01/24/24 139 lb 8 oz (63.3 kg)    There is no height or weight on file to calculate BMI.  Performance status (ECOG): 2 - Symptomatic, <50% confined to bed  PHYSICAL EXAM:  Physical Exam Vitals and nursing note reviewed.  Constitutional:      General: She is not in acute distress.    Appearance: She is ill-appearing (chronically ill appearing).  HENT:     Head: Normocephalic and atraumatic.     Right Ear: Tympanic membrane, ear canal and external ear normal.     Left Ear: Tympanic membrane, ear canal and external ear normal.     Nose: Nose normal.     Mouth/Throat:     Mouth: Mucous membranes are moist.     Pharynx: Oropharynx is clear. No oropharyngeal exudate or posterior oropharyngeal erythema.  Eyes:     General: No scleral icterus.    Extraocular Movements: Extraocular movements intact.     Conjunctiva/sclera: Conjunctivae normal.     Pupils: Pupils are equal, round, and reactive to light.  Cardiovascular:     Rate and Rhythm: Normal rate and regular rhythm.     Pulses: Normal pulses.     Heart sounds: Normal heart sounds. No murmur heard.    No friction rub. No gallop.  Pulmonary:     Effort: Pulmonary effort is normal.     Breath sounds: Normal breath sounds. No wheezing, rhonchi or rales.  Abdominal:     General: There is no distension.     Palpations: Abdomen is soft. There is no  hepatomegaly, splenomegaly or mass.     Tenderness: There is no abdominal tenderness.  Musculoskeletal:        General: Normal range of motion.     Cervical back: Normal range of motion and neck supple. No tenderness.     Right lower leg: Edema (mild) present.     Left lower leg: Edema (mild) present.  Lymphadenopathy:     Cervical: No cervical adenopathy.     Upper Body:     Right upper body: No supraclavicular or axillary adenopathy.     Left upper body: No supraclavicular or axillary adenopathy.     Lower Body: No right inguinal adenopathy. No left inguinal adenopathy.  Skin:    General: Skin is warm and dry.     Coloration: Skin is not jaundiced.     Findings: No rash.  Neurological:     Mental Status: She is oriented to person, place, and time. She is lethargic.     Cranial Nerves: Dysarthria present. No cranial nerve deficit.     Gait: Gait abnormal.     Comments: Right hemiparesis but good hand grip bilaterally   Psychiatric:        Mood and Affect: Mood normal.        Speech: Speech is delayed and slurred.        Behavior: Behavior is slowed.        Thought Content: Thought content normal.        Cognition and Memory: Cognition is impaired. Memory is impaired.        Judgment: Judgment is inappropriate.     LABS:      Latest Ref Rng & Units 06/13/2024    9:55 AM 05/31/2024   11:09 AM 04/16/2024   10:10  AM  CBC  WBC 4.0 - 10.5 K/uL 2.8  3.0  3.2   Hemoglobin 12.0 - 15.0 g/dL 9.3  9.7  89.9   Hematocrit 36.0 - 46.0 % 27.4  27.8  29.1   Platelets 150 - 400 K/uL 269  225  251       Latest Ref Rng & Units 05/31/2024   11:09 AM 05/15/2024    4:16 PM 04/19/2024   11:08 AM  CMP  Glucose 70 - 99 mg/dL 825  75  781   BUN 6 - 20 mg/dL 6  9  6    Creatinine 0.44 - 1.00 mg/dL 9.27  9.35  9.24   Sodium 135 - 145 mmol/L 143  148  146   Potassium 3.5 - 5.1 mmol/L 2.1  2.3  3.5   Chloride 98 - 111 mmol/L 93  90  102   CO2 22 - 32 mmol/L 40  35  30   Calcium  8.9 - 10.3  mg/dL 9.0  9.4  9.2   Total Protein 6.5 - 8.1 g/dL 6.4  6.6    Total Bilirubin 0.0 - 1.2 mg/dL 1.4  1.4    Alkaline Phos 38 - 126 U/L 70  81    AST 15 - 41 U/L 42  35    ALT 0 - 44 U/L 45  35     Lab Results  Component Value Date   TSH 3.380 08/08/2022   Lab Results  Component Value Date   CEA1 1.9 11/16/2022   /  CEA  Date Value Ref Range Status  11/16/2022 1.9 0.0 - 4.7 ng/mL Final    Comment:    (NOTE)                             Nonsmokers          <3.9                             Smokers             <5.6 [employer] Electrochemiluminescence Immunoassay (ECLIA) Values obtained with different assay methods or kits cannot be used interchangeably.  Results cannot be interpreted as absolute evidence of the presence or absence of malignant disease. Performed At: [facility] [address] [place], [place] [id number] Jennette Shorter MD Ey:[id number]    No results found for: PSA1 Lab Results  Component Value Date   RJW800 58 (H) 04/16/2024   Lab Results  Component Value Date   TIBC 358 01/04/2024   TIBC 259 08/08/2022   TIBC 301 11/09/2021   FERRITIN 58 01/04/2024   FERRITIN 134 08/08/2022   FERRITIN 59 11/09/2021   IRONPCTSAT 22 01/04/2024   IRONPCTSAT 42 08/08/2022   IRONPCTSAT 26 11/09/2021     STUDIES:  EXAM: 04/03/2024 CT HEAD WITHOUT CONTRAST IMPRESSION: 1. No acute intracranial abnormality. 2. Remote lacunar infarcts in the bilateral basal ganglia and right thalamus. Right thalamic infarct is new since 2024. 3. Moderate chronic microvascular ischemic changes. 4. Generalized parenchymal volume loss, mild cerebellar volume loss, and brainstem atrophy, similar to prior MRI.  EXAM: 01/04/2024 DIGITAL SCREENING BILATERAL MAMMOGRAM WITH TOMOSYNTHESIS AND CAD IMPRESSION: No mammographic evidence of malignancy.  EXAM: 01/04/2024 MRI ABDOMEN WITHOUT AND WITH CONTRAST (INCLUDING MRCP) IMPRESSION: 1. No significant change in a lobulated,  thinly septated cystic lesion in the central pancreatic head, measuring 4.5 x 3.7 cm. No  solid component or suspicious contrast enhancement. No pancreatic ductal dilatation or surrounding inflammatory changes. 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 As previously reported, consider EUS/FNA for definitive diagnosis if not previously performed, and recommend ongoing imaging surveillance given size. 2. Status post cholecystectomy. Unchanged postoperative biliary ductal dilatation. HISTORY:   Past Medical History:  Diagnosis Date   Acid reflux    Anxiety    Carpal tunnel syndrome    Cerebrovascular disease    Diabetic retinopathy (HCC)    Family history of pancreatic cancer 08/09/2021   Gait abnormality    History of stroke    Hyperlipidemia    Hypertension    Migraine    Pancreatic cyst 08/09/2021   Retinal detachment    Thrombocytosis    Trigger finger    Ulnar nerve entrapment     Past Surgical History:  Procedure Laterality Date   BIOPSY  05/11/2023   Procedure: BIOPSY;  Surgeon: Wilhelmenia Aloha Raddle., MD;  Location: [facility];  Service: Gastroenterology;;   CESAREAN SECTION  1997   ESOPHAGOGASTRODUODENOSCOPY N/A 05/11/2023   Procedure: ESOPHAGOGASTRODUODENOSCOPY (EGD);  Surgeon: Wilhelmenia Aloha Raddle., MD;  Location: [facility];  Service: Gastroenterology;  Laterality: N/A;   EUS N/A 05/11/2023   Procedure: UPPER ENDOSCOPIC ULTRASOUND (EUS) RADIAL;  Surgeon: Wilhelmenia Aloha Raddle., MD;  Location: [facility];  Service: Gastroenterology;  Laterality: N/A;   FINE NEEDLE ASPIRATION N/A 05/11/2023   Procedure: FINE NEEDLE ASPIRATION (FNA) LINEAR;  Surgeon: Wilhelmenia Aloha Raddle., MD;  Location: [facility];  Service: Gastroenterology;  Laterality: N/A;   GALLBLADDER SURGERY  2011    Family History  Problem Relation Age of Onset   Diabetes Mother    Cancer Mother    Pancreatic cancer Father    Pancreatic  cancer Brother    Pancreatic cancer Brother    Healthy Child    Pancreatic cancer Paternal Uncle     Social History:  reports that she has never smoked. She has never used smokeless tobacco. She reports that she does not drink alcohol  and does not use drugs.The patient is alone today.  Allergies:  Allergies  Allergen Reactions   Iodinated Contrast Media Other (See Comments) and Shortness Of Breath   Morphine  Itching and Other (See Comments)    Makes me feel out of my mind per pt.    Current Medications: Current Outpatient Medications  Medication Sig Dispense Refill   ALPRAZolam  (XANAX ) 0.5 MG tablet Take 1 tablet (0.5 mg total) by mouth every 8 (eight) hours as needed for anxiety. 90 tablet 2   amLODipine  (NORVASC ) 10 MG tablet Take 1 tablet (10 mg total) by mouth daily. 90 tablet 1   aspirin EC 81 MG tablet Take 81 mg by mouth daily. Swallow whole.     BD PEN NEEDLE NANO 2ND GEN 32G X 4 MM MISC CHECK BLOOD SUGAR THREE TIMES A DAY 100 each 3   clopidogrel  (PLAVIX ) 75 MG tablet Take 1 tablet (75 mg total) by mouth daily. 90 tablet 1   fluticasone  (FLONASE ) 50 MCG/ACT nasal spray PLACE 1 SPRAY INTO BOTH NOSTRILS IN THE MORNING AND AT BEDTIME. 48 mL 1   fluticasone  (FLOVENT  HFA) 44 MCG/ACT inhaler      hydrochlorothiazide  (HYDRODIURIL ) 25 MG tablet Take 1 tablet (25 mg total) by mouth daily. 90 tablet 1   insulin  aspart (NOVOLOG  FLEXPEN) 100 UNIT/ML FlexPen Inject 35 Units into the skin 3 (three) times daily with meals. 15 mL 11   insulin   glargine (LANTUS ) 100 UNIT/ML Solostar Pen Inject 80 Units into the skin daily. 15 mL 1   lisinopril  (ZESTRIL ) 40 MG tablet Take 1 tablet (40 mg total) by mouth daily. 90 tablet 1   metFORMIN  (GLUCOPHAGE ) 1000 MG tablet TAKE 1 TABLET (1,000 MG TOTAL) BY MOUTH TWICE A DAY WITH FOOD 180 tablet 0   naloxone  (NARCAN ) nasal spray 4 mg/0.1 mL As directed 2 each 1   ondansetron  (ZOFRAN ) 4 MG tablet Take 1 tablet (4 mg total) by mouth every 8 (eight) hours as  needed for nausea or vomiting. 20 tablet 0   Potassium Chloride  ER 20 MEQ TBCR Take 1 tablet (20 mEq total) by mouth in the morning, at noon, and at bedtime. 270 tablet 2   prochlorperazine (COMPAZINE) 10 MG tablet Take 10 mg by mouth every 6 (six) hours as needed for nausea or vomiting.     RABEprazole  (ACIPHEX ) 20 MG tablet Take 1 tablet (20 mg total) by mouth daily. 90 tablet 0   rosuvastatin  (CRESTOR ) 20 MG tablet Take 1 tablet (20 mg total) by mouth daily. 30 tablet 11   traMADol  (ULTRAM ) 50 MG tablet Take 1 tablet (50 mg total) by mouth every 6 (six) hours as needed. 30 tablet 0   No current facility-administered medications for this visit.

## 2024-06-13 NOTE — Patient Instructions (Signed)
 Potassium Chloride Injection What is this medication? POTASSIUM CHLORIDE (poe TASS i um KLOOR ide) prevents and treats low levels of potassium in your body. Potassium plays an important role in maintaining the health of your kidneys, heart, muscles, and nervous system. This medicine may be used for other purposes; ask your health care provider or pharmacist if you have questions. COMMON BRAND NAME(S): PROAMP What should I tell my care team before I take this medication? They need to know if you have any of these conditions: Addison disease Dehydration Diabetes (high blood sugar) Heart disease High levels of potassium in the blood Irregular heartbeat or rhythm Kidney disease Large areas of burned skin An unusual or allergic reaction to potassium, other medications, foods, dyes, or preservatives Pregnant or trying to get pregnant Breast-feeding How should I use this medication? This medication is injected into a vein. It is given in a hospital or clinic setting. Talk to your care team about the use of this medication in children. Special care may be needed. Overdosage: If you think you have taken too much of this medicine contact a poison control center or emergency room at once. NOTE: This medicine is only for you. Do not share this medicine with others. What if I miss a dose? This does not apply. This medication is not for regular use. What may interact with this medication? Do not take this medication with any of the following: Certain diuretics, such as spironolactone, triamterene Eplerenone Sodium polystyrene sulfonate This medication may also interact with the following: Certain medications for blood pressure or heart disease, such as lisinopril, losartan, quinapril, valsartan Medications that lower your chance of fighting infection, such as cyclosporine, tacrolimus NSAIDs, medications for pain and inflammation, such as ibuprofen or naproxen Other potassium supplements Salt  substitutes This list may not describe all possible interactions. Give your health care provider a list of all the medicines, herbs, non-prescription drugs, or dietary supplements you use. Also tell them if you smoke, drink alcohol, or use illegal drugs. Some items may interact with your medicine. What should I watch for while using this medication? Visit your care team for regular checks on your progress. Tell your care team if your symptoms do not start to get better or if they get worse. You may need blood work while you are taking this medication. Avoid salt substitutes unless you are told otherwise by your care team. What side effects may I notice from receiving this medication? Side effects that you should report to your care team as soon as possible: Allergic reactions--skin rash, itching, hives, swelling of the face, lips, tongue, or throat High potassium level--muscle weakness, fast or irregular heartbeat Side effects that usually do not require medical attention (report to your care team if they continue or are bothersome): Diarrhea Nausea Stomach pain Vomiting This list may not describe all possible side effects. Call your doctor for medical advice about side effects. You may report side effects to FDA at 1-800-FDA-1088. Where should I keep my medication? This medication is given in a hospital or clinic. It will not be stored at home. NOTE: This sheet is a summary. It may not cover all possible information. If you have questions about this medicine, talk to your doctor, pharmacist, or health care provider.  2024 Elsevier/Gold Standard (2022-01-28 00:00:00)

## 2024-06-13 NOTE — Telephone Encounter (Signed)
 CRITICAL VALUE STICKER  CRITICAL VALUE: K+  2.3  RECEIVER (on-site recipient of call):  Eathan Groman,RN  DATE & TIME NOTIFIED: 06/13/2024 @ 1054  MESSENGER (representative from lab): Gordy Espy  MD NOTIFIED: Eleanor Harl CARROLYN Devere [facility]  TIME OF NOTIFICATION: 1055  RESPONSE:  Pt in infusion, orders for  IV potassium today as well as oral potassium for home.

## 2024-06-14 DIAGNOSIS — F419 Anxiety disorder, unspecified: Secondary | ICD-10-CM | POA: Diagnosis not present

## 2024-06-14 DIAGNOSIS — I69354 Hemiplegia and hemiparesis following cerebral infarction affecting left non-dominant side: Secondary | ICD-10-CM | POA: Diagnosis not present

## 2024-06-14 DIAGNOSIS — E11319 Type 2 diabetes mellitus with unspecified diabetic retinopathy without macular edema: Secondary | ICD-10-CM | POA: Diagnosis not present

## 2024-06-14 DIAGNOSIS — E1165 Type 2 diabetes mellitus with hyperglycemia: Secondary | ICD-10-CM | POA: Diagnosis not present

## 2024-06-14 DIAGNOSIS — G43909 Migraine, unspecified, not intractable, without status migrainosus: Secondary | ICD-10-CM | POA: Diagnosis not present

## 2024-06-14 DIAGNOSIS — I10 Essential (primary) hypertension: Secondary | ICD-10-CM | POA: Diagnosis not present

## 2024-06-17 DIAGNOSIS — I10 Essential (primary) hypertension: Secondary | ICD-10-CM | POA: Diagnosis not present

## 2024-06-17 DIAGNOSIS — E1165 Type 2 diabetes mellitus with hyperglycemia: Secondary | ICD-10-CM | POA: Diagnosis not present

## 2024-06-17 DIAGNOSIS — G43909 Migraine, unspecified, not intractable, without status migrainosus: Secondary | ICD-10-CM | POA: Diagnosis not present

## 2024-06-17 DIAGNOSIS — F419 Anxiety disorder, unspecified: Secondary | ICD-10-CM | POA: Diagnosis not present

## 2024-06-17 DIAGNOSIS — E11319 Type 2 diabetes mellitus with unspecified diabetic retinopathy without macular edema: Secondary | ICD-10-CM | POA: Diagnosis not present

## 2024-06-17 DIAGNOSIS — I69354 Hemiplegia and hemiparesis following cerebral infarction affecting left non-dominant side: Secondary | ICD-10-CM | POA: Diagnosis not present

## 2024-06-18 DIAGNOSIS — Z91199 Patient's noncompliance with other medical treatment and regimen due to unspecified reason: Secondary | ICD-10-CM | POA: Diagnosis not present

## 2024-06-18 DIAGNOSIS — G43909 Migraine, unspecified, not intractable, without status migrainosus: Secondary | ICD-10-CM | POA: Diagnosis not present

## 2024-06-18 DIAGNOSIS — Z794 Long term (current) use of insulin: Secondary | ICD-10-CM | POA: Diagnosis not present

## 2024-06-18 DIAGNOSIS — E11319 Type 2 diabetes mellitus with unspecified diabetic retinopathy without macular edema: Secondary | ICD-10-CM | POA: Diagnosis not present

## 2024-06-18 DIAGNOSIS — Z9181 History of falling: Secondary | ICD-10-CM | POA: Diagnosis not present

## 2024-06-18 DIAGNOSIS — Z7902 Long term (current) use of antithrombotics/antiplatelets: Secondary | ICD-10-CM | POA: Diagnosis not present

## 2024-06-18 DIAGNOSIS — E1165 Type 2 diabetes mellitus with hyperglycemia: Secondary | ICD-10-CM | POA: Diagnosis not present

## 2024-06-18 DIAGNOSIS — E782 Mixed hyperlipidemia: Secondary | ICD-10-CM | POA: Diagnosis not present

## 2024-06-18 DIAGNOSIS — G5620 Lesion of ulnar nerve, unspecified upper limb: Secondary | ICD-10-CM | POA: Diagnosis not present

## 2024-06-18 DIAGNOSIS — K219 Gastro-esophageal reflux disease without esophagitis: Secondary | ICD-10-CM | POA: Diagnosis not present

## 2024-06-18 DIAGNOSIS — Z7984 Long term (current) use of oral hypoglycemic drugs: Secondary | ICD-10-CM | POA: Diagnosis not present

## 2024-06-18 DIAGNOSIS — E876 Hypokalemia: Secondary | ICD-10-CM | POA: Diagnosis not present

## 2024-06-18 DIAGNOSIS — Z7985 Long-term (current) use of injectable non-insulin antidiabetic drugs: Secondary | ICD-10-CM | POA: Diagnosis not present

## 2024-06-18 DIAGNOSIS — R1312 Dysphagia, oropharyngeal phase: Secondary | ICD-10-CM | POA: Diagnosis not present

## 2024-06-18 DIAGNOSIS — I69322 Dysarthria following cerebral infarction: Secondary | ICD-10-CM | POA: Diagnosis not present

## 2024-06-18 DIAGNOSIS — F419 Anxiety disorder, unspecified: Secondary | ICD-10-CM | POA: Diagnosis not present

## 2024-06-18 DIAGNOSIS — R296 Repeated falls: Secondary | ICD-10-CM | POA: Diagnosis not present

## 2024-06-18 DIAGNOSIS — I10 Essential (primary) hypertension: Secondary | ICD-10-CM | POA: Diagnosis not present

## 2024-06-18 DIAGNOSIS — Z7982 Long term (current) use of aspirin: Secondary | ICD-10-CM | POA: Diagnosis not present

## 2024-06-18 DIAGNOSIS — I69354 Hemiplegia and hemiparesis following cerebral infarction affecting left non-dominant side: Secondary | ICD-10-CM | POA: Diagnosis not present

## 2024-06-19 ENCOUNTER — Inpatient Hospital Stay

## 2024-06-19 ENCOUNTER — Inpatient Hospital Stay: Admitting: Hematology and Oncology

## 2024-06-19 DIAGNOSIS — I10 Essential (primary) hypertension: Secondary | ICD-10-CM | POA: Diagnosis not present

## 2024-06-19 DIAGNOSIS — R1312 Dysphagia, oropharyngeal phase: Secondary | ICD-10-CM | POA: Diagnosis not present

## 2024-06-19 DIAGNOSIS — E11319 Type 2 diabetes mellitus with unspecified diabetic retinopathy without macular edema: Secondary | ICD-10-CM | POA: Diagnosis not present

## 2024-06-19 DIAGNOSIS — I69322 Dysarthria following cerebral infarction: Secondary | ICD-10-CM | POA: Diagnosis not present

## 2024-06-19 DIAGNOSIS — I69354 Hemiplegia and hemiparesis following cerebral infarction affecting left non-dominant side: Secondary | ICD-10-CM | POA: Diagnosis not present

## 2024-06-19 DIAGNOSIS — E1165 Type 2 diabetes mellitus with hyperglycemia: Secondary | ICD-10-CM | POA: Diagnosis not present

## 2024-06-20 DIAGNOSIS — I69322 Dysarthria following cerebral infarction: Secondary | ICD-10-CM | POA: Diagnosis not present

## 2024-06-20 DIAGNOSIS — R1312 Dysphagia, oropharyngeal phase: Secondary | ICD-10-CM | POA: Diagnosis not present

## 2024-06-20 DIAGNOSIS — E1165 Type 2 diabetes mellitus with hyperglycemia: Secondary | ICD-10-CM | POA: Diagnosis not present

## 2024-06-20 DIAGNOSIS — I69354 Hemiplegia and hemiparesis following cerebral infarction affecting left non-dominant side: Secondary | ICD-10-CM | POA: Diagnosis not present

## 2024-06-20 DIAGNOSIS — E11319 Type 2 diabetes mellitus with unspecified diabetic retinopathy without macular edema: Secondary | ICD-10-CM | POA: Diagnosis not present

## 2024-06-20 DIAGNOSIS — I10 Essential (primary) hypertension: Secondary | ICD-10-CM | POA: Diagnosis not present

## 2024-06-20 NOTE — Telephone Encounter (Signed)
 Name of Caller/Relationship: PATIENT     Medication: OXYCODONE      Pharmacy and location:   [id number], [place] - [address] - PHONE: [phone] - FAX: [phone]   Call back number is:  [phone] (home)    Patient aware to allow 48-72 hours for refill request.   Last OV: 03/06/2024   Upcoming OV: 06/26/2024

## 2024-06-21 ENCOUNTER — Telehealth: Payer: Self-pay

## 2024-06-21 NOTE — Telephone Encounter (Signed)
 Pt called to tell us  the ultram  is giving her a headache, so she stopped taking it. I asked if she was using tylenol  or ibuprofen? She states, I ain't taking nothing.

## 2024-06-25 ENCOUNTER — Encounter: Payer: Self-pay | Admitting: Internal Medicine

## 2024-06-25 ENCOUNTER — Ambulatory Visit: Admitting: Internal Medicine

## 2024-06-25 VITALS — BP 150/74 | HR 65 | Temp 97.5°F | Resp 16 | Ht 64.0 in

## 2024-06-25 DIAGNOSIS — E876 Hypokalemia: Secondary | ICD-10-CM | POA: Diagnosis not present

## 2024-06-25 DIAGNOSIS — I69354 Hemiplegia and hemiparesis following cerebral infarction affecting left non-dominant side: Secondary | ICD-10-CM | POA: Diagnosis not present

## 2024-06-25 DIAGNOSIS — G894 Chronic pain syndrome: Secondary | ICD-10-CM | POA: Diagnosis not present

## 2024-06-25 DIAGNOSIS — E1165 Type 2 diabetes mellitus with hyperglycemia: Secondary | ICD-10-CM | POA: Diagnosis not present

## 2024-06-25 DIAGNOSIS — I10 Essential (primary) hypertension: Secondary | ICD-10-CM | POA: Diagnosis not present

## 2024-06-25 DIAGNOSIS — R1312 Dysphagia, oropharyngeal phase: Secondary | ICD-10-CM | POA: Diagnosis not present

## 2024-06-25 DIAGNOSIS — I69322 Dysarthria following cerebral infarction: Secondary | ICD-10-CM | POA: Diagnosis not present

## 2024-06-25 DIAGNOSIS — E11319 Type 2 diabetes mellitus with unspecified diabetic retinopathy without macular edema: Secondary | ICD-10-CM | POA: Diagnosis not present

## 2024-06-25 MED ORDER — OXYCODONE HCL 5 MG PO TABS: 5.0000 mg | ORAL_TABLET | Freq: Four times a day (QID) | ORAL | 0 refills | Status: DC | PRN

## 2024-06-25 NOTE — Assessment & Plan Note (Signed)
 We will check her CMP today and further care will depend on this level.  Her noncompliance greatly effects her healthcare.

## 2024-06-25 NOTE — Progress Notes (Signed)
 Office Visit  Subjective   Patient ID: Allison Stevens   DOB: 02-25-1966   Age: 58 y.o.   MRN: [id number]   Chief Complaint Chief Complaint  Patient presents with   Follow-up    Pt reports she had a stroke 1 month ago, and did not go to the hospital.      History of Present Illness Allison Stevens comes in today for followup of hypokalemia where she has seen Hem/Onc several times.  They gave her an IV infusion of potassium in their office and called me to see her in followup.  It looks like she cancelled one of her appointments.  They did write her potassium chloride  10mg  mEq po BID but she has been noncompliant with taking this.  She denies any myalgias, generalized weakness, muscle twitching or other problems.  She is on hydrochlorothiazide  at this time.  Hem/Onc explained to her that her hypokalemia is severe and could be life threatening and recommended she go to the emergency room. She refused so they gave er IV potassium as described above.   I saw her a month ago for followup of her recent stroke.  She was not getting therapies at that time and we did arrange this and she states she is now getting therapies.  The patient also has chronic pain where she has been followed by Allison Stevens.  She has had persistent pain in the left upper quadrant, attributed to splenomegaly, as well as bone pain, for which she had been on OxyContin  with oxycodone  for breakthrough.  She was subsequently tapered off OxyContin  and oxycodone  has been decreased.  She has recently been on tramadol  but this did not help.  The patient tells me today that Allison Stevens wants us  to take over for her pain management.     Past Medical History Past Medical History:  Diagnosis Date   Acid reflux    Anxiety    Carpal tunnel syndrome    Cerebrovascular disease    Diabetic retinopathy (HCC)    Family history of pancreatic cancer 08/09/2021   Gait abnormality    History of stroke    Hyperlipidemia     Hypertension    Migraine    Pancreatic cyst 08/09/2021   Retinal detachment    Thrombocytosis    Trigger finger    Ulnar nerve entrapment      Allergies Allergies  Allergen Reactions   Iodinated Contrast Media Other (See Comments) and Shortness Of Breath   Morphine  Itching and Other (See Comments)    Makes me feel out of my mind per pt.     Medications  Current Outpatient Medications:    ALPRAZolam  (XANAX ) 0.5 MG tablet, Take 1 tablet (0.5 mg total) by mouth every 8 (eight) hours as needed for anxiety., Disp: 90 tablet, Rfl: 2   amLODipine  (NORVASC ) 10 MG tablet, Take 1 tablet (10 mg total) by mouth daily., Disp: 90 tablet, Rfl: 1   aspirin EC 81 MG tablet, Take 81 mg by mouth daily. Swallow whole., Disp: , Rfl:    BD PEN NEEDLE NANO 2ND GEN 32G X 4 MM MISC, CHECK BLOOD SUGAR THREE TIMES A DAY, Disp: 100 each, Rfl: 3   clopidogrel  (PLAVIX ) 75 MG tablet, Take 1 tablet (75 mg total) by mouth daily., Disp: 90 tablet, Rfl: 1   fluticasone  (FLONASE ) 50 MCG/ACT nasal spray, PLACE 1 SPRAY INTO BOTH NOSTRILS IN THE MORNING AND AT BEDTIME., Disp: 48 mL, Rfl: 1   fluticasone  (FLOVENT  HFA)  44 MCG/ACT inhaler, , Disp: , Rfl:    hydrochlorothiazide  (HYDRODIURIL ) 25 MG tablet, Take 1 tablet (25 mg total) by mouth daily., Disp: 90 tablet, Rfl: 1   insulin  aspart (NOVOLOG  FLEXPEN) 100 UNIT/ML FlexPen, Inject 35 Units into the skin 3 (three) times daily with meals., Disp: 15 mL, Rfl: 11   insulin  glargine (LANTUS ) 100 UNIT/ML Solostar Pen, Inject 80 Units into the skin daily., Disp: 15 mL, Rfl: 1   lisinopril  (ZESTRIL ) 40 MG tablet, Take 1 tablet (40 mg total) by mouth daily., Disp: 90 tablet, Rfl: 1   metFORMIN  (GLUCOPHAGE ) 1000 MG tablet, TAKE 1 TABLET (1,000 MG TOTAL) BY MOUTH TWICE A DAY WITH FOOD, Disp: 180 tablet, Rfl: 0   naloxone  (NARCAN ) nasal spray 4 mg/0.1 mL, As directed, Disp: 2 each, Rfl: 1   ondansetron  (ZOFRAN ) 4 MG tablet, Take 1 tablet (4 mg total) by mouth every 8 (eight) hours  as needed for nausea or vomiting., Disp: 20 tablet, Rfl: 0   potassium chloride  (KLOR-CON  M) 10 MEQ tablet, Take 2 tablets (20 mEq total) by mouth 2 (two) times daily., Disp: 60 tablet, Rfl: 0   prochlorperazine (COMPAZINE) 10 MG tablet, Take 10 mg by mouth every 6 (six) hours as needed for nausea or vomiting., Disp: , Rfl:    RABEprazole  (ACIPHEX ) 20 MG tablet, Take 1 tablet (20 mg total) by mouth daily., Disp: 90 tablet, Rfl: 0   rosuvastatin  (CRESTOR ) 20 MG tablet, Take 1 tablet (20 mg total) by mouth daily., Disp: 30 tablet, Rfl: 11   traMADol  (ULTRAM ) 50 MG tablet, Take 1 tablet (50 mg total) by mouth every 6 (six) hours as needed., Disp: 30 tablet, Rfl: 0   Review of Systems Review of Systems  Constitutional:  Negative for chills and fever.  Eyes:  Negative for blurred vision.  Respiratory:  Negative for shortness of breath.   Cardiovascular:  Negative for chest pain, palpitations and leg swelling.  Gastrointestinal:  Negative for abdominal pain, constipation, diarrhea, nausea and vomiting.  Musculoskeletal:  Negative for myalgias.  Neurological:  Negative for dizziness, weakness and headaches.       Objective:    Vitals BP (!) 150/74   Pulse 65   Temp (!) 97.5 F (36.4 C) (Temporal)   Resp 16   Ht 5' 4 (1.626 m)   SpO2 97%   BMI 22.31 kg/m    Physical Examination Physical Exam Constitutional:      Appearance: Normal appearance. She is not ill-appearing.  Cardiovascular:     Rate and Rhythm: Normal rate and regular rhythm.     Pulses: Normal pulses.     Heart sounds: No murmur heard.    No friction rub. No gallop.  Pulmonary:     Effort: Pulmonary effort is normal. No respiratory distress.     Breath sounds: No wheezing, rhonchi or rales.  Abdominal:     General: Bowel sounds are normal. There is no distension.     Palpations: Abdomen is soft.     Tenderness: There is no abdominal tenderness.  Musculoskeletal:     Right lower leg: No edema.     Left lower leg:  No edema.  Skin:    General: Skin is warm and dry.     Findings: No rash.  Neurological:     Mental Status: She is alert.        Assessment & Plan:   Hypokalemia We will check her CMP today and further care will depend on this level.  Her noncompliance greatly effects her healthcare.  Chronic pain syndrome I reviewed her [employer] CSR/PDMP.  I also reviewed her notes regarding her pain from oncology.  We will write her for 1 month of oxycodone  5mg  po q 6 hrs prn and reassess her in 1 month.    Return in about 4 weeks (around 07/23/2024).   Selinda Fleeta Finger, MD

## 2024-06-25 NOTE — Assessment & Plan Note (Signed)
 I reviewed her [place] CSR/PDMP.  I also reviewed her notes regarding her pain from oncology.  We will write her for 1 month of oxycodone  5mg  po q 6 hrs prn and reassess her in 1 month.

## 2024-06-26 DIAGNOSIS — E1165 Type 2 diabetes mellitus with hyperglycemia: Secondary | ICD-10-CM | POA: Diagnosis not present

## 2024-06-26 DIAGNOSIS — R1312 Dysphagia, oropharyngeal phase: Secondary | ICD-10-CM | POA: Diagnosis not present

## 2024-06-26 DIAGNOSIS — I10 Essential (primary) hypertension: Secondary | ICD-10-CM | POA: Diagnosis not present

## 2024-06-26 DIAGNOSIS — E11319 Type 2 diabetes mellitus with unspecified diabetic retinopathy without macular edema: Secondary | ICD-10-CM | POA: Diagnosis not present

## 2024-06-26 DIAGNOSIS — I69354 Hemiplegia and hemiparesis following cerebral infarction affecting left non-dominant side: Secondary | ICD-10-CM | POA: Diagnosis not present

## 2024-06-26 DIAGNOSIS — I69322 Dysarthria following cerebral infarction: Secondary | ICD-10-CM | POA: Diagnosis not present

## 2024-07-01 DIAGNOSIS — R1312 Dysphagia, oropharyngeal phase: Secondary | ICD-10-CM | POA: Diagnosis not present

## 2024-07-01 DIAGNOSIS — E11319 Type 2 diabetes mellitus with unspecified diabetic retinopathy without macular edema: Secondary | ICD-10-CM | POA: Diagnosis not present

## 2024-07-01 DIAGNOSIS — I10 Essential (primary) hypertension: Secondary | ICD-10-CM | POA: Diagnosis not present

## 2024-07-01 DIAGNOSIS — I69354 Hemiplegia and hemiparesis following cerebral infarction affecting left non-dominant side: Secondary | ICD-10-CM | POA: Diagnosis not present

## 2024-07-01 DIAGNOSIS — I69322 Dysarthria following cerebral infarction: Secondary | ICD-10-CM | POA: Diagnosis not present

## 2024-07-01 DIAGNOSIS — E1165 Type 2 diabetes mellitus with hyperglycemia: Secondary | ICD-10-CM | POA: Diagnosis not present

## 2024-07-02 ENCOUNTER — Inpatient Hospital Stay: Attending: Hematology and Oncology

## 2024-07-02 ENCOUNTER — Inpatient Hospital Stay: Admitting: Hematology and Oncology

## 2024-07-02 ENCOUNTER — Other Ambulatory Visit: Payer: Self-pay | Admitting: Internal Medicine

## 2024-07-02 DIAGNOSIS — K862 Cyst of pancreas: Secondary | ICD-10-CM | POA: Insufficient documentation

## 2024-07-02 DIAGNOSIS — Z800 Family history of malignant neoplasm of digestive organs: Secondary | ICD-10-CM | POA: Insufficient documentation

## 2024-07-02 DIAGNOSIS — D473 Essential (hemorrhagic) thrombocythemia: Secondary | ICD-10-CM | POA: Insufficient documentation

## 2024-07-02 DIAGNOSIS — D701 Agranulocytosis secondary to cancer chemotherapy: Secondary | ICD-10-CM | POA: Insufficient documentation

## 2024-07-02 DIAGNOSIS — Z8673 Personal history of transient ischemic attack (TIA), and cerebral infarction without residual deficits: Secondary | ICD-10-CM | POA: Insufficient documentation

## 2024-07-02 DIAGNOSIS — T451X5A Adverse effect of antineoplastic and immunosuppressive drugs, initial encounter: Secondary | ICD-10-CM | POA: Insufficient documentation

## 2024-07-02 DIAGNOSIS — D649 Anemia, unspecified: Secondary | ICD-10-CM | POA: Insufficient documentation

## 2024-07-02 DIAGNOSIS — E876 Hypokalemia: Secondary | ICD-10-CM | POA: Insufficient documentation

## 2024-07-02 MED ORDER — ALPRAZOLAM 0.5 MG PO TABS: 0.5000 mg | ORAL_TABLET | Freq: Three times a day (TID) | ORAL | 2 refills | Status: AC | PRN

## 2024-07-03 ENCOUNTER — Telehealth: Payer: Self-pay | Admitting: Oncology

## 2024-07-03 NOTE — Telephone Encounter (Signed)
 07/03/24 LVM to R/S appts.

## 2024-07-04 ENCOUNTER — Telehealth: Payer: Self-pay | Admitting: Hematology and Oncology

## 2024-07-04 DIAGNOSIS — I69354 Hemiplegia and hemiparesis following cerebral infarction affecting left non-dominant side: Secondary | ICD-10-CM | POA: Diagnosis not present

## 2024-07-04 DIAGNOSIS — I10 Essential (primary) hypertension: Secondary | ICD-10-CM | POA: Diagnosis not present

## 2024-07-04 DIAGNOSIS — E11319 Type 2 diabetes mellitus with unspecified diabetic retinopathy without macular edema: Secondary | ICD-10-CM | POA: Diagnosis not present

## 2024-07-04 DIAGNOSIS — R1312 Dysphagia, oropharyngeal phase: Secondary | ICD-10-CM | POA: Diagnosis not present

## 2024-07-04 DIAGNOSIS — E1165 Type 2 diabetes mellitus with hyperglycemia: Secondary | ICD-10-CM | POA: Diagnosis not present

## 2024-07-04 DIAGNOSIS — I69322 Dysarthria following cerebral infarction: Secondary | ICD-10-CM | POA: Diagnosis not present

## 2024-07-04 NOTE — Telephone Encounter (Signed)
 Contacted pt to reschedule missed appt from 07/02/24. Unable to reach via phone, voicemail was left.

## 2024-07-04 NOTE — Telephone Encounter (Signed)
 07/04/24 Spoke with patient and rescheduled appts.

## 2024-07-09 ENCOUNTER — Inpatient Hospital Stay: Admitting: Hematology and Oncology

## 2024-07-09 ENCOUNTER — Other Ambulatory Visit: Payer: Self-pay | Admitting: Hematology and Oncology

## 2024-07-09 ENCOUNTER — Inpatient Hospital Stay

## 2024-07-09 ENCOUNTER — Other Ambulatory Visit: Payer: Self-pay

## 2024-07-09 VITALS — BP 196/71 | HR 68 | Temp 98.3°F | Resp 14 | Ht 64.0 in

## 2024-07-09 DIAGNOSIS — D701 Agranulocytosis secondary to cancer chemotherapy: Secondary | ICD-10-CM | POA: Diagnosis not present

## 2024-07-09 DIAGNOSIS — K862 Cyst of pancreas: Secondary | ICD-10-CM | POA: Diagnosis not present

## 2024-07-09 DIAGNOSIS — E876 Hypokalemia: Secondary | ICD-10-CM

## 2024-07-09 DIAGNOSIS — D473 Essential (hemorrhagic) thrombocythemia: Secondary | ICD-10-CM | POA: Diagnosis present

## 2024-07-09 DIAGNOSIS — D649 Anemia, unspecified: Secondary | ICD-10-CM | POA: Diagnosis not present

## 2024-07-09 DIAGNOSIS — Z800 Family history of malignant neoplasm of digestive organs: Secondary | ICD-10-CM | POA: Diagnosis not present

## 2024-07-09 DIAGNOSIS — Z8673 Personal history of transient ischemic attack (TIA), and cerebral infarction without residual deficits: Secondary | ICD-10-CM | POA: Diagnosis not present

## 2024-07-09 DIAGNOSIS — T451X5A Adverse effect of antineoplastic and immunosuppressive drugs, initial encounter: Secondary | ICD-10-CM | POA: Diagnosis not present

## 2024-07-09 LAB — CBC WITH DIFFERENTIAL (CANCER CENTER ONLY)
Abs Immature Granulocytes: 0.01 K/uL (ref 0.00–0.07)
Basophils Absolute: 0 K/uL (ref 0.0–0.1)
Basophils Relative: 0 %
Eosinophils Absolute: 0.1 K/uL (ref 0.0–0.5)
Eosinophils Relative: 2 %
HCT: 30.9 % — ABNORMAL LOW (ref 36.0–46.0)
Hemoglobin: 10.3 g/dL — ABNORMAL LOW (ref 12.0–15.0)
Immature Granulocytes: 0 %
Lymphocytes Relative: 24 %
Lymphs Abs: 0.8 K/uL (ref 0.7–4.0)
MCH: 31.7 pg (ref 26.0–34.0)
MCHC: 33.3 g/dL (ref 30.0–36.0)
MCV: 95.1 fL (ref 80.0–100.0)
Monocytes Absolute: 0.2 K/uL (ref 0.1–1.0)
Monocytes Relative: 6 %
Neutro Abs: 2.1 K/uL (ref 1.7–7.7)
Neutrophils Relative %: 68 %
Platelet Count: 260 K/uL (ref 150–400)
RBC: 3.25 MIL/uL — ABNORMAL LOW (ref 3.87–5.11)
RDW: 15.2 % (ref 11.5–15.5)
WBC Count: 3.2 K/uL — ABNORMAL LOW (ref 4.0–10.5)
nRBC: 0 % (ref 0.0–0.2)

## 2024-07-09 LAB — CMP (CANCER CENTER ONLY)
ALT: 24 U/L (ref 0–44)
AST: 31 U/L (ref 15–41)
Albumin: 3.7 g/dL (ref 3.5–5.0)
Alkaline Phosphatase: 69 U/L (ref 38–126)
Anion gap: 9 (ref 5–15)
BUN: 8 mg/dL (ref 6–20)
CO2: 34 mmol/L — ABNORMAL HIGH (ref 22–32)
Calcium: 9.8 mg/dL (ref 8.9–10.3)
Chloride: 99 mmol/L (ref 98–111)
Creatinine: 0.65 mg/dL (ref 0.44–1.00)
GFR, Estimated: >60 mL/min (ref >60)
Glucose, Bld: 233 mg/dL — ABNORMAL HIGH (ref 70–99)
Potassium: 2.6 mmol/L — CL (ref 3.5–5.1)
Sodium: 141 mmol/L (ref 135–145)
Total Bilirubin: 0.5 mg/dL (ref 0.0–1.2)
Total Protein: 6.5 g/dL (ref 6.5–8.1)

## 2024-07-09 MED ORDER — POTASSIUM CHLORIDE 10 % PO SOLN: 20.0000 meq | Freq: Two times a day (BID) | ORAL | 0 refills | Status: AC

## 2024-07-09 NOTE — Progress Notes (Signed)
 CRITICAL VALUE STICKER  CRITICAL VALUE:  K+ 2.6  RECEIVER (on-site recipient of call):  Woodie Kapur RN  DATE & TIME NOTIFIED:   07/09/2024 @ 1137  MESSENGER (representative from lab):  Gordy HEATH lab  MD NOTIFIED:   Eleanor Mouse NP  TIME OF NOTIFICATION:  1138  RESPONSE:   Acknowledged

## 2024-07-09 NOTE — Progress Notes (Signed)
 [facility]  [address] [place],  [place]  [address] [phone]  Clinic Day: 05/31/2024  Referring physician: Fleeta Valeria Mayo, MD  ASSESSMENT & PLAN:  Assessment: Essential thrombocytosis (HCC) Essential thrombocythemia, previously treated with hydroxyurea 500 mg 4 times daily. She developed leukopenia and anemia, so hydroxyurea was decreased to 3 times daily.  In April 2024, hydroxyurea was decreased to twice daily due to persistent leukopenia.  In June 2024, she had worsening leukopenia with the WBC  to 2.6, as well as worsening anemia.  Her platelets were normal at 274,000, so hydroxyurea was discontinued.  Her platelets have remained normal since that time.  She has had steady worsening of her leukopenia and anemia. Dr. Cornelius thinks she is evolving into myelofibrosis and explained this to her.    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  In November 2023, this increased significantly to 3.8 cm in maximum diameter and appears to communicate with the main pancreatic duct. She was gastroenterology in [place] but did not keep her appointment, she had a repeat MRI abdomen in May 2024 which revealed further enlargement of the pancreatic lesion to 4.5 cm. She has a strong family history for pancreatic cancer and another brother was referred to hospice because it was unresectable at the time of surgery.  She was re-referred to gastroenterology in [facility] for an EUS and saw Dr. Wilhelmenia. The EUS had to be rescheduled due to hypertension, but she was finally able to have EUS and biopsy of the pancreatic cyst in October. Cytology was negative.  She had a repeat MRI of the pancreas in June of 2025, and there was actually mild decrease in the cyst.    Leukopenia due to antineoplastic chemotherapy (HCC) Her white count is stable at 3.1 with an ANC 2300, which is stable.  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 Unfortunately, she is also at risk for development of acute leukemia due to her myeloproliferative disorder as well as the hydroxyurea.    Anemia Mild chronic anemia, which is likely secondary to hydroxyurea, which has been discontinued. This has worsened probably due to blood loss at this time. She will need evaluation by GI and gynecology as discussed with Dr. Fleeta Valeria. She did not mention bleeding today.  Acute and Chronic Strokes In the past this was due to her severe thrombocytosis but her platelet are under control now for the last 2 years. I explained this is more likely due to uncontrolled hypertension, diabetes, and hyperlipidemia. She is very non-compliant despite multiple discussions.    Family history of pancreatic cancer Two first-degree relatives, her brothers, with pancreatic cancer.  One brother declined genetic testing that we know of and he has expired. Another brother is on hospice, so cannot be tested.  Genetic testing has been recommended for the patient, but not done due to lack of insurance coverage. She reports are multiple other relatives with pancreatic cancer also.   Hypokalemia This has been ongoing and she hasn't been able to stay for a complete treatment of her hypokalemia in the past. Again, today, she states she is unable to stay for IV potassium. We discussed adding potassium into her diet with bananas as she states she does like bananas. I will also switch her to liquid potassium to see if this is easier to tolerate than the potassium tablets/capsules she has at home. I will prescribe 20 mEq twice  daily and we will repeat labs in 2-4 weeks depending on her transportation.   Plan:  She has been off hydroxyurea for over 1 year now with anemia and leukopenia. CBC today reveals white count 3.2, hemoglobin 10.3 and platelets 260. CMP is unremarkable other than a decreased potassium 2.6 and elevated glucose 233. The patient understands the  plans discussed today and is in agreement with them.  She knows to contact our office if she develops concerns prior to her next appointment.  I provided 20 minutes of face-to-face time during this encounter and > 50% was spent counseling as documented under my assessment and plan.   [name], FNP-BC  [facility] [place] [facility] A DEPT OF [facility] [address] [place] [address] Dept: [phone] Dept Fax: [phone]   No orders of the defined types were placed in this encounter.   CHIEF COMPLAINT:  CC: Essential thrombocytosis  Current Treatment: Surveillance  HISTORY OF PRESENT ILLNESS:  Allison Stevens is a 58 y.o. female with a history of essential thrombocythemia originally diagnosed in January 2005.  She was treated with hydroxyurea, but was often non-compliant.  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  She has persistent pain in the left upper quadrant, attributed to splenomegaly, as well as bone pain, for which she had been on OxyContin  with oxycodone  for breakthrough.  She was subsequently tapered off OxyContin  and oxycodone  has been decreased.  We have been following a lesion in the pancreas seen incidentally on CT done in the emergency room for abdominal pain.  This has been felt to be a benign cyst.  Repeat MRI abdomen in November 2016, revealed a slight increase in the benign appearing cyst of the pancreas.  No splenomegaly was seen.  Repeat MRI abdomen in 1 year was recommended.  She was hospitalized in September 2017 and underwent colonoscopy with removal of a small polyp in the sigmoid colon.  Small internal hemorrhoids were also seen.  Dr. Charlanne also obtained a CEA and CA 19-9, and. the CA 19-9 was elevated at 145, the CEA was normal. EGD in April 2018 showed mild gastritis, a small hiatal hernia, and a Schatzki ring.  She had esophageal dilation at that time.  MRI abdomen with  contrast in September 2017 revealed a stable 14 mm cystic lesion in the pancreas.  Again, no splenomegaly was seen.  Follow-up MRI in 1 year was recommended. MRI abdomen in March 2019 revealed a 17 mm cystic lesion in the pancreatic uncinate process, which was felt to be stable.  No significant liver lesions were seen. Repeat MRI abdomen in 1 year was recommended.  MRI abdomen in September 2019 revealed a stable 19 mm cystic lesion of the pancreas, which appeared to be benign and still felt to be stable.  Repeat MRI in 1 year was recommended.  MRI abdomen in October 2020 revealed a stable 17 mm cystic lesion of the pancreas. Continued yearly follow up MRI abdomen was recommended.  MRI abdomen in September 2021 revealed a 1.9 cm cystic lesion in the pancreatic uncinate process, which showed no significant change compared to more recent exams, but has shown a slow increase in size since 2016. 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 Follow-up by MRI in 2 years was recommended.    Screening mammogram in February 2016 revealed a possible mass in the right breast.  Right diagnostic mammogram and ultrasound was done and revealed 2 well circumscribed hypoechoic lesions in the right breast at 11 o 'clock, felt to represent cysts.  Repeat mammogram and ultrasound in August revealed heterogeneous fibroglandular tissue felt to be benign, with resolution of the previously seen cysts and no masses.  Repeat diagnostic bilateral mammogram and ultrasound in 6 months was once again recommended. Bilateral diagnostic mammogram in March 2017 did not reveal any evidence of malignancy, so 1 year follow-up screening mammogram was recommended.  Bilateral screening mammogram in March 2018 did not reveal any evidence of malignancy. Bone density scan in June 2018 revealed osteopenia with a T-score of -1.5 in the femur.  The patient was instructed to take calcium  and  vitamin-D twice daily.   Bilateral screening mammogram in December 2020 did not reveal any evidence of malignancy.  Bilateral screening mammogram in November 2022 was negative.   The patient's family history is significant in that her father had pancreatic cancer at age 59, a paternal uncle had pancreatic cancer and a paternal aunt had pancreatic cancer.  Her brother also had pancreatic cancer at age 46.  She is appropriate for genetic testing, but this has not been done yet due to insurance coverage.   MRI abdomen in November 2023 revealed significant interval enlargement of a thinly septated cystic lesion of the central pancreatic head, now measuring 3.8 x 3.0 cm, previously 2.5 x 2.0 cm. This appears to communicate with the adjacent main pancreatic duct, and as previously reported, this has been observed to grow very slowly over a long period of time on examinations dating back to at least 2015. 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 Given substantial interval growth and size and greater than 2.5 cm, recommend EUS/FNA for definitive diagnosis.  She was referred to gastroenterology in [facility] and was scheduled with Dr. Wilhelmenia in January 2024, but did not keep her appointment.  MRI abdomen in May 2024 revealed continued enlargement of a lobulated, thinly septated cystic lesion in the central pancreatic head now measuring 4.5 x 3.7 cm.  EUS/FNA was once again recommended.  She was referred back and saw Dr. Wilhelmenia.  Her EUS had to be delayed due to hypertension.  She finally had this done in October and cytology was negative.  She had colonoscopy in April 2024 with Misenheimer, but this was incomplete due to poor prep.  EGD revealed esophageal stricture which was dilated again.  Bilateral screening mammogram in May 2024 was negative.  Bilateral screening mammogram in May 2024 did not reveal any evidence of malignancy.  She had been on hydroxyurea 500 mg 4 times a day for many years.  Hydroxyurea was  decreased to 3 times a day in September 2022, as her platelets were controlled and she was more anemic with a hemoglobin of 10. Repeat CBC in November 2022 revealed her hemoglobin to be back up to 11.6 and platelets remained in good range so we continued hydroxyurea 500 mg 3 times daily.  At some point, the patient started taking hydroxyurea 4 times daily.  She developed mild leukopenia in January 2024, so so we decreased hydroxyurea back to 3 times daily.  She had worsening neutropenia and anemia in May 2024, so we decreased hydroxyurea to once daily.  Since she had persistent neutropenia and anemia, hydroxyurea was discontinued in July of 2024.  She had some improvement in the neutropenia with stable anemia and platelets normal, so she has remained off hydroxyurea.  INTERVAL HISTORY:  Claryce is here  today for repeat clinical assessment for her essential thrombocytosis. She has been off hydroxyurea for over 1 year now with anemia and leukopenia.  When we saw her last month, we gave her IV potassium, but she left after 20 meq IV. I will see her back in 4 weeks with CBC and CMP. We will plan an infusion appointment to possibly repeat IV potassium. I will ask Dr. Fleeta Finger to see her in the next 1-2 weeks.  She denies fever, chills, night sweats, or other signs of infection. She denies cardiorespiratory and gastrointestinal issues. She  denies pain. Her appetite is okay and Her weight was not taken today. She was 130lb as of 05/15/2024.  REVIEW OF SYSTEMS:  Review of Systems  Constitutional:  Positive for appetite change (poor) and fatigue. Negative for chills, diaphoresis, fever and unexpected weight change.  HENT:  Negative.  Negative for lump/mass, mouth sores, sore throat and trouble swallowing.   Eyes: Negative.   Respiratory: Negative.  Negative for chest tightness, cough, hemoptysis, shortness of breath and wheezing.   Cardiovascular: Negative.  Negative for chest pain, leg swelling and palpitations.   Gastrointestinal:  Positive for abdominal pain (left side, 7/10), nausea (severe) and vomiting. Negative for abdominal distention, blood in stool, constipation and diarrhea.  Endocrine: Negative.  Negative for hot flashes.  Genitourinary:  Negative for difficulty urinating, dysuria, frequency, hematuria and vaginal bleeding.   Musculoskeletal:  Positive for gait problem (since stroke, uses walker). Negative for arthralgias, back pain, flank pain, myalgias and neck pain.  Skin: Negative.  Negative for itching and rash.  Neurological:  Positive for extremity weakness (since stroke), gait problem (since stroke, uses walker) and speech difficulty (slurred speech). Negative for dizziness, headaches, light-headedness, numbness and seizures.  Hematological: Negative.  Negative for adenopathy. Does not bruise/bleed easily.  Psychiatric/Behavioral:  Positive for depression. Negative for sleep disturbance. The patient is not nervous/anxious.     VITALS:  Blood pressure (!) 196/71, pulse 68, temperature 98.3 F (36.8 C), temperature source Oral, resp. rate 14, height 5' 4 (1.626 m), SpO2 100%.  Wt Readings from Last 3 Encounters:  05/15/24 130 lb (59 kg)  04/17/24 130 lb (59 kg)  01/24/24 139 lb 8 oz (63.3 kg)    Body mass index is 22.31 kg/m.  Performance status (ECOG): 2 - Symptomatic, <50% confined to bed  PHYSICAL EXAM:  Physical Exam Vitals and nursing note reviewed.  Constitutional:      General: She is not in acute distress.    Appearance: She is ill-appearing (chronically ill appearing).  HENT:     Head: Normocephalic and atraumatic.     Right Ear: Tympanic membrane, ear canal and external ear normal.     Left Ear: Tympanic membrane, ear canal and external ear normal.     Nose: Nose normal.     Mouth/Throat:     Mouth: Mucous membranes are moist.     Pharynx: Oropharynx is clear. No oropharyngeal exudate or posterior oropharyngeal erythema.  Eyes:     General: No scleral  icterus.    Extraocular Movements: Extraocular movements intact.     Conjunctiva/sclera: Conjunctivae normal.     Pupils: Pupils are equal, round, and reactive to light.  Cardiovascular:     Rate and Rhythm: Normal rate and regular rhythm.     Pulses: Normal pulses.     Heart sounds: Normal heart sounds. No murmur heard.    No friction rub. No gallop.  Pulmonary:     Effort: Pulmonary effort  is normal.     Breath sounds: Normal breath sounds. No wheezing, rhonchi or rales.  Abdominal:     General: There is no distension.     Palpations: Abdomen is soft. There is no hepatomegaly, splenomegaly or mass.     Tenderness: There is no abdominal tenderness.  Musculoskeletal:        General: Normal range of motion.     Cervical back: Normal range of motion and neck supple. No tenderness.     Right lower leg: Edema (mild) present.     Left lower leg: Edema (mild) present.  Lymphadenopathy:     Cervical: No cervical adenopathy.     Upper Body:     Right upper body: No supraclavicular or axillary adenopathy.     Left upper body: No supraclavicular or axillary adenopathy.     Lower Body: No right inguinal adenopathy. No left inguinal adenopathy.  Skin:    General: Skin is warm and dry.     Coloration: Skin is not jaundiced.     Findings: No rash.  Neurological:     Mental Status: She is oriented to person, place, and time. She is lethargic.     Cranial Nerves: Dysarthria present. No cranial nerve deficit.     Gait: Gait abnormal.     Comments: Right hemiparesis but good hand grip bilaterally   Psychiatric:        Mood and Affect: Mood normal.        Speech: Speech is delayed and slurred.        Behavior: Behavior is slowed.        Thought Content: Thought content normal.        Cognition and Memory: Cognition is impaired. Memory is impaired.        Judgment: Judgment is inappropriate.     LABS:      Latest Ref Rng & Units 07/09/2024   10:57 AM 06/13/2024    9:55 AM 05/31/2024    11:09 AM  CBC  WBC 4.0 - 10.5 K/uL 3.2  2.8  3.0   Hemoglobin 12.0 - 15.0 g/dL 89.6  9.3  9.7   Hematocrit 36.0 - 46.0 % 30.9  27.4  27.8   Platelets 150 - 400 K/uL 260  269  225       Latest Ref Rng & Units 07/09/2024   10:57 AM 06/13/2024    9:55 AM 05/31/2024   11:09 AM  CMP  Glucose 70 - 99 mg/dL 766  761  825   BUN 6 - 20 mg/dL 8  8  6    Creatinine 0.44 - 1.00 mg/dL 9.34  9.31  9.27   Sodium 135 - 145 mmol/L 141  144  143   Potassium 3.5 - 5.1 mmol/L 2.6  2.3  2.1   Chloride 98 - 111 mmol/L 99  96  93   CO2 22 - 32 mmol/L 34  39  40   Calcium  8.9 - 10.3 mg/dL 9.8  9.4  9.0   Total Protein 6.5 - 8.1 g/dL 6.5  6.1  6.4   Total Bilirubin 0.0 - 1.2 mg/dL 0.5  0.6  1.4   Alkaline Phos 38 - 126 U/L 69  71  70   AST 15 - 41 U/L 31  43  42   ALT 0 - 44 U/L 24  44  45    Lab Results  Component Value Date   TSH 3.380 08/08/2022   Lab Results  Component Value Date  CEA1 1.9 11/16/2022   /  CEA  Date Value Ref Range Status  11/16/2022 1.9 0.0 - 4.7 ng/mL Final    Comment:    (NOTE)                             Nonsmokers          <3.9                             Smokers             <5.6 [employer] Electrochemiluminescence Immunoassay (ECLIA) Values obtained with different assay methods or kits cannot be used interchangeably.  Results cannot be interpreted as absolute evidence of the presence or absence of malignant disease. Performed At: [facility] [address] [place], [place] [id number] Jennette Shorter MD Ey:[id number]    No results found for: PSA1 Lab Results  Component Value Date   RJW800 58 (H) 04/16/2024   Lab Results  Component Value Date   TIBC 358 01/04/2024   TIBC 259 08/08/2022   TIBC 301 11/09/2021   FERRITIN 58 01/04/2024   FERRITIN 134 08/08/2022   FERRITIN 59 11/09/2021   IRONPCTSAT 22 01/04/2024   IRONPCTSAT 42 08/08/2022   IRONPCTSAT 26 11/09/2021     STUDIES:  EXAM: 04/03/2024 CT HEAD WITHOUT  CONTRAST IMPRESSION: 1. No acute intracranial abnormality. 2. Remote lacunar infarcts in the bilateral basal ganglia and right thalamus. Right thalamic infarct is new since 2024. 3. Moderate chronic microvascular ischemic changes. 4. Generalized parenchymal volume loss, mild cerebellar volume loss, and brainstem atrophy, similar to prior MRI.  EXAM: 01/04/2024 DIGITAL SCREENING BILATERAL MAMMOGRAM WITH TOMOSYNTHESIS AND CAD IMPRESSION: No mammographic evidence of malignancy.  EXAM: 01/04/2024 MRI ABDOMEN WITHOUT AND WITH CONTRAST (INCLUDING MRCP) IMPRESSION: 1. No significant change in a lobulated, thinly septated cystic lesion in the central pancreatic head, measuring 4.5 x 3.7 cm. No solid component or suspicious contrast enhancement. No pancreatic ductal dilatation or surrounding inflammatory changes. 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 As previously reported, consider EUS/FNA for definitive diagnosis if not previously performed, and recommend ongoing imaging surveillance given size. 2. Status post cholecystectomy. Unchanged postoperative biliary ductal dilatation. HISTORY:   Past Medical History:  Diagnosis Date   Acid reflux    Anxiety    Carpal tunnel syndrome    Cerebrovascular disease    Diabetic retinopathy (HCC)    Family history of pancreatic cancer 08/09/2021   Gait abnormality    History of stroke    Hyperlipidemia    Hypertension    Migraine    Pancreatic cyst 08/09/2021   Retinal detachment    Thrombocytosis    Trigger finger    Ulnar nerve entrapment     Past Surgical History:  Procedure Laterality Date   BIOPSY  05/11/2023   Procedure: BIOPSY;  Surgeon: Wilhelmenia Aloha Raddle., MD;  Location: [facility];  Service: Gastroenterology;;   CESAREAN SECTION  1997   ESOPHAGOGASTRODUODENOSCOPY N/A 05/11/2023   Procedure: ESOPHAGOGASTRODUODENOSCOPY (EGD);  Surgeon: Wilhelmenia Aloha Raddle., MD;   Location: [facility];  Service: Gastroenterology;  Laterality: N/A;   EUS N/A 05/11/2023   Procedure: UPPER ENDOSCOPIC ULTRASOUND (EUS) RADIAL;  Surgeon: Wilhelmenia Aloha Raddle., MD;  Location: [facility];  Service: Gastroenterology;  Laterality: N/A;   FINE NEEDLE ASPIRATION N/A 05/11/2023   Procedure: FINE NEEDLE ASPIRATION (FNA) LINEAR;  Surgeon: Wilhelmenia Aloha Raddle., MD;  Location: [facility];  Service: Gastroenterology;  Laterality: N/A;   GALLBLADDER SURGERY  2011    Family History  Problem Relation Age of Onset   Diabetes Mother    Cancer Mother    Pancreatic cancer Father    Pancreatic cancer Brother    Pancreatic cancer Brother    Healthy Child    Pancreatic cancer Paternal Uncle     Social History:  reports that she has never smoked. She has never used smokeless tobacco. She reports that she does not drink alcohol  and does not use drugs.The patient is alone today.  Allergies:  Allergies  Allergen Reactions   Iodinated Contrast Media Other (See Comments) and Shortness Of Breath   Morphine  Itching and Other (See Comments)    Makes me feel out of my mind per pt.    Current Medications: Current Outpatient Medications  Medication Sig Dispense Refill   ALPRAZolam  (XANAX ) 0.5 MG tablet Take 1 tablet (0.5 mg total) by mouth every 8 (eight) hours as needed for anxiety. 90 tablet 2   amLODipine  (NORVASC ) 10 MG tablet Take 1 tablet (10 mg total) by mouth daily. 90 tablet 1   aspirin EC 81 MG tablet Take 81 mg by mouth daily. Swallow whole.     BD PEN NEEDLE NANO 2ND GEN 32G X 4 MM MISC CHECK BLOOD SUGAR THREE TIMES A DAY 100 each 3   clopidogrel  (PLAVIX ) 75 MG tablet Take 1 tablet (75 mg total) by mouth daily. 90 tablet 1   fluticasone  (FLONASE ) 50 MCG/ACT nasal spray PLACE 1 SPRAY INTO BOTH NOSTRILS IN THE MORNING AND AT BEDTIME. 48 mL 1   fluticasone  (FLOVENT  HFA) 44 MCG/ACT inhaler      hydrochlorothiazide  (HYDRODIURIL ) 25 MG tablet Take 1 tablet (25 mg total) by  mouth daily. 90 tablet 1   insulin  aspart (NOVOLOG  FLEXPEN) 100 UNIT/ML FlexPen Inject 35 Units into the skin 3 (three) times daily with meals. 15 mL 11   insulin  glargine (LANTUS ) 100 UNIT/ML Solostar Pen Inject 80 Units into the skin daily. 15 mL 1   lisinopril  (ZESTRIL ) 40 MG tablet Take 1 tablet (40 mg total) by mouth daily. 90 tablet 1   metFORMIN  (GLUCOPHAGE ) 1000 MG tablet TAKE 1 TABLET (1,000 MG TOTAL) BY MOUTH TWICE A DAY WITH FOOD 180 tablet 0   naloxone  (NARCAN ) nasal spray 4 mg/0.1 mL As directed 2 each 1   ondansetron  (ZOFRAN ) 4 MG tablet Take 1 tablet (4 mg total) by mouth every 8 (eight) hours as needed for nausea or vomiting. 20 tablet 0   oxyCODONE  (OXY IR/ROXICODONE ) 5 MG immediate release tablet Take 1 tablet (5 mg total) by mouth every 6 (six) hours as needed for severe pain (pain score 7-10). 120 tablet 0   potassium chloride  (KLOR-CON  M) 10 MEQ tablet Take 2 tablets (20 mEq total) by mouth 2 (two) times daily. 60 tablet 0   prochlorperazine (COMPAZINE) 10 MG tablet Take 10 mg by mouth every 6 (six) hours as needed for nausea or vomiting.     RABEprazole  (ACIPHEX ) 20 MG tablet Take 1 tablet (20 mg total) by mouth daily. 90 tablet 0   rosuvastatin  (CRESTOR ) 20 MG tablet Take 1 tablet (20 mg total) by mouth daily. 30 tablet 11   traMADol  (ULTRAM ) 50 MG tablet Take 1 tablet (50 mg total) by mouth every 6 (six) hours as needed. 30 tablet 0   Potassium Chloride  10 % SOLN Take 20 mEq by mouth 2 (  two) times daily. 473 mL 0   No current facility-administered medications for this visit.

## 2024-07-12 DIAGNOSIS — I69354 Hemiplegia and hemiparesis following cerebral infarction affecting left non-dominant side: Secondary | ICD-10-CM | POA: Diagnosis not present

## 2024-07-12 DIAGNOSIS — R1312 Dysphagia, oropharyngeal phase: Secondary | ICD-10-CM | POA: Diagnosis not present

## 2024-07-12 DIAGNOSIS — I69322 Dysarthria following cerebral infarction: Secondary | ICD-10-CM | POA: Diagnosis not present

## 2024-07-12 DIAGNOSIS — E1165 Type 2 diabetes mellitus with hyperglycemia: Secondary | ICD-10-CM | POA: Diagnosis not present

## 2024-07-12 DIAGNOSIS — I10 Essential (primary) hypertension: Secondary | ICD-10-CM | POA: Diagnosis not present

## 2024-07-12 DIAGNOSIS — E11319 Type 2 diabetes mellitus with unspecified diabetic retinopathy without macular edema: Secondary | ICD-10-CM | POA: Diagnosis not present

## 2024-07-15 ENCOUNTER — Encounter: Payer: Self-pay | Admitting: Oncology

## 2024-07-16 DIAGNOSIS — I69322 Dysarthria following cerebral infarction: Secondary | ICD-10-CM | POA: Diagnosis not present

## 2024-07-16 DIAGNOSIS — E11319 Type 2 diabetes mellitus with unspecified diabetic retinopathy without macular edema: Secondary | ICD-10-CM | POA: Diagnosis not present

## 2024-07-16 DIAGNOSIS — I69354 Hemiplegia and hemiparesis following cerebral infarction affecting left non-dominant side: Secondary | ICD-10-CM | POA: Diagnosis not present

## 2024-07-16 DIAGNOSIS — E1165 Type 2 diabetes mellitus with hyperglycemia: Secondary | ICD-10-CM | POA: Diagnosis not present

## 2024-07-16 DIAGNOSIS — R1312 Dysphagia, oropharyngeal phase: Secondary | ICD-10-CM | POA: Diagnosis not present

## 2024-07-16 DIAGNOSIS — I10 Essential (primary) hypertension: Secondary | ICD-10-CM | POA: Diagnosis not present

## 2024-07-17 DIAGNOSIS — R1312 Dysphagia, oropharyngeal phase: Secondary | ICD-10-CM | POA: Diagnosis not present

## 2024-07-17 DIAGNOSIS — E1165 Type 2 diabetes mellitus with hyperglycemia: Secondary | ICD-10-CM | POA: Diagnosis not present

## 2024-07-17 DIAGNOSIS — I69354 Hemiplegia and hemiparesis following cerebral infarction affecting left non-dominant side: Secondary | ICD-10-CM | POA: Diagnosis not present

## 2024-07-17 DIAGNOSIS — I69322 Dysarthria following cerebral infarction: Secondary | ICD-10-CM | POA: Diagnosis not present

## 2024-07-17 DIAGNOSIS — I10 Essential (primary) hypertension: Secondary | ICD-10-CM | POA: Diagnosis not present

## 2024-07-17 DIAGNOSIS — E11319 Type 2 diabetes mellitus with unspecified diabetic retinopathy without macular edema: Secondary | ICD-10-CM | POA: Diagnosis not present

## 2024-07-20 ENCOUNTER — Other Ambulatory Visit: Payer: Self-pay | Admitting: Hematology and Oncology

## 2024-07-20 DIAGNOSIS — M898X9 Other specified disorders of bone, unspecified site: Secondary | ICD-10-CM

## 2024-07-20 DIAGNOSIS — R161 Splenomegaly, not elsewhere classified: Secondary | ICD-10-CM

## 2024-07-22 ENCOUNTER — Encounter: Payer: Self-pay | Admitting: Internal Medicine

## 2024-07-22 ENCOUNTER — Ambulatory Visit: Admitting: Internal Medicine

## 2024-07-22 ENCOUNTER — Encounter: Payer: Self-pay | Admitting: Oncology

## 2024-07-22 VITALS — BP 124/80 | HR 68 | Temp 97.7°F | Resp 18 | Ht 64.0 in | Wt 124.4 lb

## 2024-07-22 DIAGNOSIS — E782 Mixed hyperlipidemia: Secondary | ICD-10-CM

## 2024-07-22 DIAGNOSIS — E11311 Type 2 diabetes mellitus with unspecified diabetic retinopathy with macular edema: Secondary | ICD-10-CM | POA: Diagnosis not present

## 2024-07-22 DIAGNOSIS — I10 Essential (primary) hypertension: Secondary | ICD-10-CM | POA: Diagnosis not present

## 2024-07-22 NOTE — Progress Notes (Signed)
 "  Office Visit  Subjective   Patient ID: Allison Stevens   DOB: [birth year]   Age: 58 y.o.   MRN: [id number]   Chief Complaint Chief Complaint  Patient presents with   Follow-up    4 week     History of Present Illness 58 years old female is here for follow up. She has  type 2 diabetes mellitus who is currently taking lantus  75 units daily. She denies any hypoglycemia. She has appointment with eye doctor this Friday. She see them once a year. Her HbA1c was 6.4 in October 2025.   She has stroke and her right side is weakness. She can not transfer without help from wheelchair to bed. She can feed herself.   She has hyperlipidemia and she take rosuvastatin  20 mg daily. Her lipid done in October was reviewed.    She has hypertension she takes lisinopril  40 mg daily.  Her blood pressure is controlled.     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   HENT: Negative.    Respiratory: Negative.    Cardiovascular: Negative.   Gastrointestinal: Negative.   Neurological: Negative.        Right sided weakness       Objective:    Vitals BP 124/80   Pulse 68   Temp 97.7 F (36.5 C)   Resp 18   Ht 5' 4 (1.626 m)   Wt 124 lb 6 oz (56.4 kg)   SpO2 99%   BMI 21.35 kg/m    Physical Examination Physical Exam Constitutional:      Appearance: Normal appearance.  Cardiovascular:     Rate and Rhythm: Normal rate and regular rhythm.     Heart sounds: Normal heart sounds.  Pulmonary:     Effort:  Pulmonary effort is normal.     Breath sounds: Normal breath sounds.  Abdominal:     General: Bowel sounds are normal.     Palpations: Abdomen is soft.  Neurological:     Mental Status: She is alert. Mental status is at baseline.     Comments: She has right sided weakness        Assessment & Plan:   Essential hypertension   Her blood pressure is well controlled.  No side effects from lisinopril .  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  She has an appointment to see eye doctor this Friday.  Hyperlipidemia   She takes rosuvastatin  20 mg daily.  LDL was very low that is was on lipid panel done on May 15, 2024.    Return in about 3 months (around 10/20/2024).   Roetta Dare, MD      [1]  Allergies Allergen Reactions   Iodinated Contrast Media Other (See Comments) and Shortness Of Breath   Morphine  Itching and Other (See Comments)  Makes me feel out of my mind per pt.   "

## 2024-07-23 ENCOUNTER — Other Ambulatory Visit: Payer: Self-pay

## 2024-07-23 DIAGNOSIS — E7800 Pure hypercholesterolemia, unspecified: Secondary | ICD-10-CM

## 2024-07-23 LAB — CMP14 + ANION GAP
ALT: 25 IU/L (ref 0–32)
AST: 27 IU/L (ref 0–40)
Albumin: 4.1 g/dL (ref 3.8–4.9)
Alkaline Phosphatase: 75 IU/L (ref 49–135)
Anion Gap: 19 mmol/L — ABNORMAL HIGH (ref 10.0–18.0)
BUN/Creatinine Ratio: 12 (ref 9–23)
BUN: 8 mg/dL (ref 6–24)
Bilirubin Total: 0.4 mg/dL (ref 0.0–1.2)
CO2: 25 mmol/L (ref 20–29)
Calcium: 9.5 mg/dL (ref 8.7–10.2)
Chloride: 96 mmol/L (ref 96–106)
Creatinine, Ser: 0.65 mg/dL (ref 0.57–1.00)
Globulin, Total: 2.1 g/dL (ref 1.5–4.5)
Glucose: 319 mg/dL — ABNORMAL HIGH (ref 70–99)
Potassium: 3.2 mmol/L — ABNORMAL LOW (ref 3.5–5.2)
Sodium: 140 mmol/L (ref 134–144)
Total Protein: 6.2 g/dL (ref 6.0–8.5)
eGFR: 101 mL/min/1.73

## 2024-07-23 MED ORDER — ROSUVASTATIN CALCIUM 20 MG PO TABS: 20.0000 mg | ORAL_TABLET | Freq: Every day | ORAL | 11 refills | Status: AC

## 2024-07-30 ENCOUNTER — Ambulatory Visit: Payer: Self-pay

## 2024-07-30 NOTE — Progress Notes (Signed)
 Patient called.  Pt aware , patient taking Potassium and checking sugar   Dr caleen: Her sugar is very high, her potassium is also low. Is she taking 2 tablets of potassium chloride  not?

## 2024-07-31 ENCOUNTER — Other Ambulatory Visit: Payer: Self-pay

## 2024-07-31 ENCOUNTER — Other Ambulatory Visit: Payer: Self-pay | Admitting: Internal Medicine

## 2024-07-31 MED ORDER — OXYCODONE HCL 5 MG PO TABS: 5.0000 mg | ORAL_TABLET | Freq: Four times a day (QID) | ORAL | 0 refills | Status: DC | PRN

## 2024-08-03 NOTE — Assessment & Plan Note (Signed)
"    Her diabetes is controlled.  She has an appointment to see eye doctor this Friday. "

## 2024-08-03 NOTE — Assessment & Plan Note (Signed)
"    She takes rosuvastatin  20 mg daily.  LDL was very low that is was on lipid panel done on May 15, 2024. "

## 2024-08-03 NOTE — Assessment & Plan Note (Signed)
"    Her blood pressure is well controlled.  No side effects from lisinopril . "

## 2024-08-06 ENCOUNTER — Inpatient Hospital Stay

## 2024-08-06 ENCOUNTER — Inpatient Hospital Stay: Attending: Hematology and Oncology

## 2024-08-06 ENCOUNTER — Other Ambulatory Visit: Payer: Self-pay | Admitting: Hematology and Oncology

## 2024-08-06 ENCOUNTER — Inpatient Hospital Stay: Admitting: Hematology and Oncology

## 2024-08-06 DIAGNOSIS — E876 Hypokalemia: Secondary | ICD-10-CM

## 2024-08-12 ENCOUNTER — Other Ambulatory Visit: Payer: Self-pay

## 2024-08-12 MED ORDER — FLUTICASONE PROPIONATE 50 MCG/ACT NA SUSP: 1.0000 | Freq: Two times a day (BID) | NASAL | 1 refills | Status: AC

## 2024-08-12 NOTE — Progress Notes (Signed)
 Refilled Fluticasone  Nasal Spray to [facility] in [facility]

## 2024-08-14 ENCOUNTER — Ambulatory Visit: Admitting: Internal Medicine

## 2024-08-20 ENCOUNTER — Ambulatory Visit: Admitting: Internal Medicine

## 2024-08-21 ENCOUNTER — Encounter: Payer: Self-pay | Admitting: Internal Medicine

## 2024-08-21 ENCOUNTER — Ambulatory Visit: Admitting: Internal Medicine

## 2024-08-21 VITALS — BP 120/84 | HR 72 | Temp 97.6°F | Resp 18 | Ht 64.0 in | Wt 124.0 lb

## 2024-08-21 DIAGNOSIS — R110 Nausea: Secondary | ICD-10-CM

## 2024-08-21 DIAGNOSIS — G8929 Other chronic pain: Secondary | ICD-10-CM

## 2024-08-21 DIAGNOSIS — G894 Chronic pain syndrome: Secondary | ICD-10-CM

## 2024-08-21 DIAGNOSIS — R109 Unspecified abdominal pain: Secondary | ICD-10-CM | POA: Diagnosis not present

## 2024-08-21 MED ORDER — OXYCODONE HCL 5 MG PO TABS: 5.0000 mg | ORAL_TABLET | Freq: Three times a day (TID) | ORAL | 0 refills | Status: AC | PRN

## 2024-08-21 MED ORDER — ONDANSETRON 4 MG PO TBDP: 4.0000 mg | ORAL_TABLET | Freq: Three times a day (TID) | ORAL | 0 refills | Status: AC | PRN

## 2024-08-21 NOTE — Assessment & Plan Note (Signed)
"    I will start her on Zofran  4 mg 3 times a day p.r.n. for nausea.  If it not better then she will come back. "

## 2024-08-21 NOTE — Progress Notes (Addendum)
 "  Office Visit  Subjective   Patient ID: Allison Stevens   DOB: [birth year]   Age: 59 y.o.   MRN: [id number]   Chief Complaint Chief Complaint  Patient presents with   Medication Refill    Follow up     History of Present Illness  59 years old female who is here for follow-up for oxycodone .  She says that she has chronic abdominal pain for many years and has been taking oxycodone  5 mg 4 times a day.  He could not describe what kind of pain she has.  She denies having any constipation.  This is the 2nd time I have seen this patient initially it was follow-up from hospital discharge.  She has chronic left upper quadrant abdominal pain with splenomegaly and she used to follow with Dr. Dietrich.  She used to take OxyContin  with oxycodone  for breakthrough pain.  She was subsequently tapered off to OxyContin  and then tramadol  was started but that did not help.  Dr. Dietrich has referred her and she has been followed by Dr. Fleeta Finger.  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   Gastrointestinal:  Positive for abdominal pain.       Objective:    Vitals BP 120/84   Pulse 72   Temp 97.6 F (36.4 C)   Resp 18   Ht 5' 4 (1.626 m)   Wt 124 lb (56.2 kg)   SpO2 99%   BMI 21.28 kg/m    Physical Examination Physical Exam Constitutional:      Appearance: Normal appearance.  Abdominal:     General: Bowel sounds are normal.     Palpations:  Abdomen is soft.  Neurological:     Mental Status: She is alert.        Assessment & Plan:   Chronic abdominal pain  She has a chronic left upper quadrant abdominal pain of many years duration.  Her abdomen is soft.  She is here for refill of her oxycodone .  I have suggested not to take more than 3 tablets a day.  I could not do urine drug screen because she could not give us  urine sample in spite of giving her water.  She is also having nausea and vomiting today.  I will send refill of her pain medication and will call and do random pill count and urine drug screen.  Nausea   I will start her on Zofran  4 mg 3 times a day p.r.n. for nausea.  If it not better then she will come back.    Return in about 1 month (around 09/21/2024).   Roetta Dare, MD      [1]  Allergies Allergen Reactions   Iodinated Contrast Media Other (See Comments) and Shortness Of Breath   Morphine  Itching and Other (See Comments)    Makes me feel  out of my mind per pt.   "

## 2024-08-21 NOTE — Assessment & Plan Note (Signed)
"   She has a chronic left upper quadrant abdominal pain of many years duration.  Her abdomen is soft.  She is here for refill of her oxycodone .  I have suggested not to take more than 3 tablets a day.  I could not do urine drug screen because she could not give us  urine sample in spite of giving her water.  She is also having nausea and vomiting today.  I will send refill of her pain medication and will call and do random pill count and urine drug screen. "

## 2024-08-22 ENCOUNTER — Inpatient Hospital Stay

## 2024-08-22 ENCOUNTER — Inpatient Hospital Stay: Admitting: Hematology and Oncology

## 2024-08-22 ENCOUNTER — Telehealth: Payer: Self-pay

## 2024-08-22 NOTE — Telephone Encounter (Signed)
 Attempted call to ask pt what she needs, as I was unable to understand her words, other than Dr Cornelius.

## 2024-08-30 ENCOUNTER — Inpatient Hospital Stay

## 2024-08-30 ENCOUNTER — Other Ambulatory Visit: Payer: Self-pay | Admitting: Oncology

## 2024-08-30 ENCOUNTER — Encounter: Payer: Self-pay | Admitting: Oncology

## 2024-08-30 ENCOUNTER — Inpatient Hospital Stay: Admitting: Oncology

## 2024-08-30 ENCOUNTER — Other Ambulatory Visit: Payer: Self-pay | Admitting: Pharmacist

## 2024-08-30 VITALS — BP 174/76 | HR 74 | Temp 98.3°F | Resp 18 | Ht 64.0 in

## 2024-08-30 DIAGNOSIS — D473 Essential (hemorrhagic) thrombocythemia: Secondary | ICD-10-CM

## 2024-08-30 DIAGNOSIS — G894 Chronic pain syndrome: Secondary | ICD-10-CM

## 2024-08-30 DIAGNOSIS — E876 Hypokalemia: Secondary | ICD-10-CM

## 2024-08-30 DIAGNOSIS — T451X5A Adverse effect of antineoplastic and immunosuppressive drugs, initial encounter: Secondary | ICD-10-CM

## 2024-08-30 DIAGNOSIS — D630 Anemia in neoplastic disease: Secondary | ICD-10-CM

## 2024-08-30 DIAGNOSIS — K862 Cyst of pancreas: Secondary | ICD-10-CM

## 2024-08-30 LAB — CBC WITH DIFFERENTIAL (CANCER CENTER ONLY)
Abs Immature Granulocytes: 0.01 10*3/uL (ref 0.00–0.07)
Basophils Absolute: 0 10*3/uL (ref 0.0–0.1)
Basophils Relative: 0 %
Eosinophils Absolute: 0.1 10*3/uL (ref 0.0–0.5)
Eosinophils Relative: 2 %
HCT: 29.7 % — ABNORMAL LOW (ref 36.0–46.0)
Hemoglobin: 9.9 g/dL — ABNORMAL LOW (ref 12.0–15.0)
Immature Granulocytes: 0 %
Lymphocytes Relative: 18 %
Lymphs Abs: 0.8 10*3/uL (ref 0.7–4.0)
MCH: 30.5 pg (ref 26.0–34.0)
MCHC: 33.3 g/dL (ref 30.0–36.0)
MCV: 91.4 fL (ref 80.0–100.0)
Monocytes Absolute: 0.2 10*3/uL (ref 0.1–1.0)
Monocytes Relative: 5 %
Neutro Abs: 3.3 10*3/uL (ref 1.7–7.7)
Neutrophils Relative %: 75 %
Platelet Count: 351 10*3/uL (ref 150–400)
RBC: 3.25 MIL/uL — ABNORMAL LOW (ref 3.87–5.11)
RDW: 15.8 % — ABNORMAL HIGH (ref 11.5–15.5)
WBC Count: 4.5 10*3/uL (ref 4.0–10.5)
nRBC: 0 % (ref 0.0–0.2)

## 2024-08-30 LAB — CMP (CANCER CENTER ONLY)
ALT: 14 U/L (ref 0–44)
AST: 21 U/L (ref 15–41)
Albumin: 3.9 g/dL (ref 3.5–5.0)
Alkaline Phosphatase: 83 U/L (ref 38–126)
Anion gap: 10 (ref 5–15)
BUN: 7 mg/dL (ref 6–20)
CO2: 38 mmol/L — ABNORMAL HIGH (ref 22–32)
Calcium: 10.4 mg/dL — ABNORMAL HIGH (ref 8.9–10.3)
Chloride: 96 mmol/L — ABNORMAL LOW (ref 98–111)
Creatinine: 0.62 mg/dL (ref 0.44–1.00)
GFR, Estimated: >60 mL/min
Glucose, Bld: 253 mg/dL — ABNORMAL HIGH (ref 70–99)
Potassium: 2.3 mmol/L — CL (ref 3.5–5.1)
Sodium: 144 mmol/L (ref 135–145)
Total Bilirubin: 0.6 mg/dL (ref 0.0–1.2)
Total Protein: 6.8 g/dL (ref 6.5–8.1)

## 2024-08-30 MED ORDER — SODIUM CHLORIDE 0.9 % IV SOLN: INTRAVENOUS | Status: DC

## 2024-08-30 MED ORDER — POTASSIUM CHLORIDE 10 MEQ/100ML IV SOLN
10.0000 meq | INTRAVENOUS | Status: AC
  Administered 2024-08-30 (×3): 10 meq via INTRAVENOUS
  Filled 2024-08-30: qty 100

## 2024-08-30 NOTE — Progress Notes (Signed)
 CRITICAL VALUE STICKER  CRITICAL VALUE:  K+ 2.3   RECEIVER (on-site recipient of call):  Woodie Kapur RN  DATE & TIME NOTIFIED:   08/30/2024 @ 1358  MESSENGER (representative from lab):  [facility]  MD NOTIFIED:   Dr. Cornelius  TIME OF NOTIFICATION:  1415  RESPONSE:   IV K+ today.

## 2024-08-30 NOTE — Patient Instructions (Signed)
 Low Levels of Potassium in the Blood (Hypokalemia): What to Know Hypokalemia is when you have low levels of potassium in your blood. Potassium helps your muscles, nerves, and heart work like they should. It also keeps fluids and minerals balanced in your body. Your symptoms may be mild or very bad. But even minor changes to potassium levels in your blood can be life-threatening. What are the causes? Low levels of potassium in your blood may be caused by: Medicines, such as: Laxatives. These help you poop. Diuretics. These help your body get rid of extra fluid. Antibiotics. Throwing up or having watery poop (diarrhea) often. Eating disorders, such as: Anorexia. Bulimia. Kidney disease. Sweating a lot. What are the signs or symptoms? Weakness. Trouble pooping. This is called constipation. Feeling very tired. Muscle cramps. Uneven heartbeats. Tingling of your skin. Numbness of your hands or feet. Confusion. How is this diagnosed? You may be diagnosed based on your symptoms, medical history, and an exam. Your health care provider will ask what medicines you take. You may also have tests, such as: Blood tests. An electrocardiogram (ECG). How is this treated? You may need to: Take potassium supplements. Adjust your medicines. Eat more foods that have potassium in them. If your levels are very low, you may need to get potassium through an IV. You may also need to be watched in the hospital. Follow these instructions at home: Eating and drinking  If told, eat more foods that have a lot of potassium in them, such as: Nuts, like peanuts and pistachios. Seeds, like sunflower seeds and pumpkin seeds. Peas, lentils, and lima beans. Whole grains and bran cereals and breads. Fresh fruits and vegetables. Ones high in potassium include avocados, bananas, potatoes, cantaloupe, tomatoes, asparagus, and spinach. Juices, such as orange, tomato, and prune juices. Low-fat (lean) meats, like  fish. Milk and milk products, like yogurt. General instructions Take your medicines only as told. Ask your provider before taking: Vitamins. Herbs. Supplements. Keep all follow-up visits. You may need to have blood tests done often. Contact a health care provider if: You feel weaker. You throw up often and for more than 2 days. You have watery poop often and for more than 2 days. Get help right away if: You have uneven heartbeats. You have chest pain. You feel short of breath. You faint. These symptoms may be an emergency. Call 911 right away. Do not wait to see if the symptoms will go away. Do not drive yourself to the hospital. This information is not intended to replace advice given to you by your health care provider. Make sure you discuss any questions you have with your health care provider. Document Revised: 01/22/2024 Document Reviewed: 01/22/2024 Elsevier Patient Education  2025 [employer].

## 2024-09-16 ENCOUNTER — Ambulatory Visit

## 2024-09-27 ENCOUNTER — Inpatient Hospital Stay: Admitting: Oncology

## 2024-09-27 ENCOUNTER — Inpatient Hospital Stay

## 2024-10-18 ENCOUNTER — Ambulatory Visit: Admitting: Internal Medicine
